# Patient Record
Sex: Female | Born: 1975 | Race: Black or African American | Hispanic: No | Marital: Single | Smoking: Current some day smoker
Health system: Southern US, Community
[De-identification: ages and names within clinical notes are randomized; demographics above are authoritative.]

## PROBLEM LIST (undated history)

## (undated) ENCOUNTER — Inpatient Hospital Stay (HOSPITAL_COMMUNITY): Payer: Self-pay

## (undated) DIAGNOSIS — F419 Anxiety disorder, unspecified: Secondary | ICD-10-CM

## (undated) DIAGNOSIS — B9689 Other specified bacterial agents as the cause of diseases classified elsewhere: Secondary | ICD-10-CM

## (undated) DIAGNOSIS — N83209 Unspecified ovarian cyst, unspecified side: Secondary | ICD-10-CM

## (undated) DIAGNOSIS — N760 Acute vaginitis: Secondary | ICD-10-CM

## (undated) DIAGNOSIS — N946 Dysmenorrhea, unspecified: Secondary | ICD-10-CM

## (undated) DIAGNOSIS — R569 Unspecified convulsions: Secondary | ICD-10-CM

## (undated) DIAGNOSIS — D649 Anemia, unspecified: Secondary | ICD-10-CM

## (undated) DIAGNOSIS — F329 Major depressive disorder, single episode, unspecified: Secondary | ICD-10-CM

## (undated) DIAGNOSIS — F909 Attention-deficit hyperactivity disorder, unspecified type: Secondary | ICD-10-CM

## (undated) DIAGNOSIS — B999 Unspecified infectious disease: Secondary | ICD-10-CM

## (undated) DIAGNOSIS — T7840XA Allergy, unspecified, initial encounter: Secondary | ICD-10-CM

## (undated) DIAGNOSIS — B009 Herpesviral infection, unspecified: Secondary | ICD-10-CM

## (undated) DIAGNOSIS — F32A Depression, unspecified: Secondary | ICD-10-CM

## (undated) DIAGNOSIS — O24419 Gestational diabetes mellitus in pregnancy, unspecified control: Secondary | ICD-10-CM

## (undated) DIAGNOSIS — J45909 Unspecified asthma, uncomplicated: Secondary | ICD-10-CM

## (undated) DIAGNOSIS — F1910 Other psychoactive substance abuse, uncomplicated: Secondary | ICD-10-CM

## (undated) HISTORY — DX: Gestational diabetes mellitus in pregnancy, unspecified control: O24.419

## (undated) HISTORY — DX: Anxiety disorder, unspecified: F41.9

## (undated) HISTORY — PX: CYSTECTOMY: SUR359

## (undated) HISTORY — PX: WISDOM TOOTH EXTRACTION: SHX21

## (undated) HISTORY — DX: Anemia, unspecified: D64.9

## (undated) HISTORY — DX: Other psychoactive substance abuse, uncomplicated: F19.10

## (undated) HISTORY — PX: HERNIA REPAIR: SHX51

## (undated) HISTORY — DX: Other specified bacterial agents as the cause of diseases classified elsewhere: N76.0

## (undated) HISTORY — DX: Other specified bacterial agents as the cause of diseases classified elsewhere: B96.89

## (undated) HISTORY — DX: Dysmenorrhea, unspecified: N94.6

## (undated) HISTORY — DX: Major depressive disorder, single episode, unspecified: F32.9

## (undated) HISTORY — DX: Attention-deficit hyperactivity disorder, unspecified type: F90.9

## (undated) HISTORY — DX: Allergy, unspecified, initial encounter: T78.40XA

## (undated) HISTORY — DX: Depression, unspecified: F32.A

## (undated) HISTORY — DX: Herpesviral infection, unspecified: B00.9

---

## 1996-07-23 DIAGNOSIS — O98212 Gonorrhea complicating pregnancy, second trimester: Secondary | ICD-10-CM

## 1996-07-23 DIAGNOSIS — A749 Chlamydial infection, unspecified: Secondary | ICD-10-CM

## 1997-09-30 ENCOUNTER — Inpatient Hospital Stay (HOSPITAL_COMMUNITY): Admission: AD | Admit: 1997-09-30 | Discharge: 1997-09-30 | Payer: Self-pay | Admitting: Obstetrics

## 1997-10-06 ENCOUNTER — Observation Stay (HOSPITAL_COMMUNITY): Admission: AD | Admit: 1997-10-06 | Discharge: 1997-10-07 | Payer: Self-pay | Admitting: Obstetrics

## 1998-03-16 ENCOUNTER — Inpatient Hospital Stay (HOSPITAL_COMMUNITY): Admission: AD | Admit: 1998-03-16 | Discharge: 1998-03-16 | Payer: Self-pay | Admitting: Obstetrics

## 1998-05-07 ENCOUNTER — Inpatient Hospital Stay (HOSPITAL_COMMUNITY): Admission: AD | Admit: 1998-05-07 | Discharge: 1998-05-10 | Payer: Self-pay | Admitting: *Deleted

## 2000-02-20 ENCOUNTER — Other Ambulatory Visit: Admission: RE | Admit: 2000-02-20 | Discharge: 2000-02-20 | Payer: Self-pay | Admitting: Obstetrics

## 2000-06-25 ENCOUNTER — Encounter (HOSPITAL_BASED_OUTPATIENT_CLINIC_OR_DEPARTMENT_OTHER): Payer: Self-pay | Admitting: General Surgery

## 2000-06-27 ENCOUNTER — Ambulatory Visit (HOSPITAL_COMMUNITY): Admission: RE | Admit: 2000-06-27 | Discharge: 2000-06-27 | Payer: Self-pay | Admitting: General Surgery

## 2000-07-19 ENCOUNTER — Ambulatory Visit (HOSPITAL_COMMUNITY): Admission: RE | Admit: 2000-07-19 | Discharge: 2000-07-19 | Payer: Self-pay | Admitting: General Surgery

## 2000-08-19 ENCOUNTER — Encounter (INDEPENDENT_AMBULATORY_CARE_PROVIDER_SITE_OTHER): Payer: Self-pay | Admitting: Specialist

## 2000-08-19 ENCOUNTER — Ambulatory Visit (HOSPITAL_COMMUNITY): Admission: RE | Admit: 2000-08-19 | Discharge: 2000-08-19 | Payer: Self-pay | Admitting: General Surgery

## 2001-07-17 ENCOUNTER — Encounter: Payer: Self-pay | Admitting: *Deleted

## 2001-07-17 ENCOUNTER — Emergency Department (HOSPITAL_COMMUNITY): Admission: EM | Admit: 2001-07-17 | Discharge: 2001-07-17 | Payer: Self-pay | Admitting: Emergency Medicine

## 2001-09-11 ENCOUNTER — Encounter: Payer: Self-pay | Admitting: Obstetrics

## 2001-09-11 ENCOUNTER — Ambulatory Visit (HOSPITAL_COMMUNITY): Admission: RE | Admit: 2001-09-11 | Discharge: 2001-09-11 | Payer: Self-pay | Admitting: Obstetrics

## 2001-12-29 ENCOUNTER — Inpatient Hospital Stay (HOSPITAL_COMMUNITY): Admission: AD | Admit: 2001-12-29 | Discharge: 2001-12-29 | Payer: Self-pay | Admitting: Obstetrics

## 2002-01-05 ENCOUNTER — Inpatient Hospital Stay (HOSPITAL_COMMUNITY): Admission: AD | Admit: 2002-01-05 | Discharge: 2002-01-05 | Payer: Self-pay | Admitting: Obstetrics

## 2002-01-13 ENCOUNTER — Inpatient Hospital Stay (HOSPITAL_COMMUNITY): Admission: AD | Admit: 2002-01-13 | Discharge: 2002-01-13 | Payer: Self-pay | Admitting: Obstetrics

## 2002-01-28 ENCOUNTER — Inpatient Hospital Stay (HOSPITAL_COMMUNITY): Admission: AD | Admit: 2002-01-28 | Discharge: 2002-01-28 | Payer: Self-pay | Admitting: Obstetrics

## 2002-02-25 ENCOUNTER — Inpatient Hospital Stay (HOSPITAL_COMMUNITY): Admission: AD | Admit: 2002-02-25 | Discharge: 2002-02-25 | Payer: Self-pay | Admitting: Obstetrics

## 2002-02-26 ENCOUNTER — Inpatient Hospital Stay (HOSPITAL_COMMUNITY): Admission: AD | Admit: 2002-02-26 | Discharge: 2002-02-28 | Payer: Self-pay | Admitting: Obstetrics

## 2003-09-11 ENCOUNTER — Emergency Department (HOSPITAL_COMMUNITY): Admission: EM | Admit: 2003-09-11 | Discharge: 2003-09-12 | Payer: Self-pay | Admitting: Emergency Medicine

## 2003-12-07 ENCOUNTER — Other Ambulatory Visit: Admission: RE | Admit: 2003-12-07 | Discharge: 2003-12-07 | Payer: Self-pay | Admitting: Family Medicine

## 2004-07-10 ENCOUNTER — Other Ambulatory Visit: Admission: RE | Admit: 2004-07-10 | Discharge: 2004-07-10 | Payer: Self-pay | Admitting: Family Medicine

## 2004-07-10 ENCOUNTER — Ambulatory Visit: Payer: Self-pay | Admitting: Family Medicine

## 2004-08-09 ENCOUNTER — Emergency Department (HOSPITAL_COMMUNITY): Admission: EM | Admit: 2004-08-09 | Discharge: 2004-08-09 | Payer: Self-pay | Admitting: Family Medicine

## 2004-08-11 ENCOUNTER — Emergency Department (HOSPITAL_COMMUNITY): Admission: EM | Admit: 2004-08-11 | Discharge: 2004-08-11 | Payer: Self-pay | Admitting: Family Medicine

## 2004-08-21 ENCOUNTER — Ambulatory Visit: Payer: Self-pay | Admitting: Family Medicine

## 2004-11-02 ENCOUNTER — Ambulatory Visit: Payer: Self-pay | Admitting: Family Medicine

## 2004-11-16 ENCOUNTER — Ambulatory Visit: Payer: Self-pay | Admitting: Sports Medicine

## 2004-11-30 ENCOUNTER — Ambulatory Visit: Payer: Self-pay | Admitting: Family Medicine

## 2004-12-07 ENCOUNTER — Ambulatory Visit: Payer: Self-pay | Admitting: Family Medicine

## 2005-02-21 ENCOUNTER — Ambulatory Visit: Payer: Self-pay | Admitting: Family Medicine

## 2005-06-20 ENCOUNTER — Ambulatory Visit: Payer: Self-pay | Admitting: Family Medicine

## 2005-07-23 ENCOUNTER — Encounter (INDEPENDENT_AMBULATORY_CARE_PROVIDER_SITE_OTHER): Payer: Self-pay | Admitting: *Deleted

## 2005-07-23 LAB — CONVERTED CEMR LAB

## 2005-08-03 ENCOUNTER — Other Ambulatory Visit: Admission: RE | Admit: 2005-08-03 | Discharge: 2005-08-03 | Payer: Self-pay | Admitting: Family Medicine

## 2005-08-03 ENCOUNTER — Ambulatory Visit: Payer: Self-pay | Admitting: Sports Medicine

## 2005-08-15 ENCOUNTER — Ambulatory Visit: Payer: Self-pay | Admitting: Family Medicine

## 2005-10-24 ENCOUNTER — Ambulatory Visit: Payer: Self-pay | Admitting: Family Medicine

## 2005-11-22 ENCOUNTER — Ambulatory Visit: Payer: Self-pay | Admitting: Family Medicine

## 2005-12-05 ENCOUNTER — Ambulatory Visit: Payer: Self-pay | Admitting: Family Medicine

## 2005-12-10 ENCOUNTER — Ambulatory Visit: Payer: Self-pay | Admitting: Family Medicine

## 2005-12-12 ENCOUNTER — Ambulatory Visit: Payer: Self-pay | Admitting: Family Medicine

## 2006-02-14 ENCOUNTER — Ambulatory Visit: Payer: Self-pay | Admitting: Sports Medicine

## 2006-02-19 ENCOUNTER — Ambulatory Visit: Payer: Self-pay | Admitting: Family Medicine

## 2006-03-01 ENCOUNTER — Ambulatory Visit: Payer: Self-pay | Admitting: Family Medicine

## 2006-03-04 ENCOUNTER — Ambulatory Visit: Payer: Self-pay | Admitting: Sports Medicine

## 2006-05-27 ENCOUNTER — Emergency Department (HOSPITAL_COMMUNITY): Admission: EM | Admit: 2006-05-27 | Discharge: 2006-05-27 | Payer: Self-pay | Admitting: Family Medicine

## 2006-05-28 ENCOUNTER — Ambulatory Visit: Payer: Self-pay | Admitting: Family Medicine

## 2006-06-11 ENCOUNTER — Ambulatory Visit: Payer: Self-pay | Admitting: Family Medicine

## 2006-06-19 ENCOUNTER — Ambulatory Visit: Payer: Self-pay | Admitting: Family Medicine

## 2006-09-19 DIAGNOSIS — J309 Allergic rhinitis, unspecified: Secondary | ICD-10-CM | POA: Insufficient documentation

## 2006-09-19 DIAGNOSIS — F39 Unspecified mood [affective] disorder: Secondary | ICD-10-CM | POA: Insufficient documentation

## 2006-09-19 DIAGNOSIS — D509 Iron deficiency anemia, unspecified: Secondary | ICD-10-CM | POA: Insufficient documentation

## 2006-09-20 ENCOUNTER — Encounter (INDEPENDENT_AMBULATORY_CARE_PROVIDER_SITE_OTHER): Payer: Self-pay | Admitting: *Deleted

## 2006-09-30 ENCOUNTER — Telehealth: Payer: Self-pay | Admitting: *Deleted

## 2006-10-02 ENCOUNTER — Ambulatory Visit: Payer: Self-pay | Admitting: Sports Medicine

## 2006-10-02 LAB — CONVERTED CEMR LAB: Beta hcg, urine, semiquantitative: NEGATIVE

## 2006-10-24 ENCOUNTER — Telehealth: Payer: Self-pay | Admitting: *Deleted

## 2006-10-25 ENCOUNTER — Ambulatory Visit: Payer: Self-pay | Admitting: Family Medicine

## 2006-10-25 LAB — CONVERTED CEMR LAB: Beta hcg, urine, semiquantitative: NEGATIVE

## 2006-11-04 ENCOUNTER — Telehealth (INDEPENDENT_AMBULATORY_CARE_PROVIDER_SITE_OTHER): Payer: Self-pay | Admitting: *Deleted

## 2006-11-06 ENCOUNTER — Ambulatory Visit: Payer: Self-pay | Admitting: Family Medicine

## 2006-11-06 DIAGNOSIS — M2010 Hallux valgus (acquired), unspecified foot: Secondary | ICD-10-CM | POA: Insufficient documentation

## 2006-11-11 ENCOUNTER — Telehealth: Payer: Self-pay | Admitting: *Deleted

## 2006-11-14 ENCOUNTER — Telehealth (INDEPENDENT_AMBULATORY_CARE_PROVIDER_SITE_OTHER): Payer: Self-pay | Admitting: *Deleted

## 2006-11-18 ENCOUNTER — Telehealth (INDEPENDENT_AMBULATORY_CARE_PROVIDER_SITE_OTHER): Payer: Self-pay | Admitting: Family Medicine

## 2006-11-20 ENCOUNTER — Telehealth: Payer: Self-pay | Admitting: *Deleted

## 2006-11-21 ENCOUNTER — Telehealth: Payer: Self-pay | Admitting: *Deleted

## 2006-12-02 ENCOUNTER — Telehealth: Payer: Self-pay | Admitting: *Deleted

## 2006-12-17 ENCOUNTER — Telehealth: Payer: Self-pay | Admitting: *Deleted

## 2006-12-17 ENCOUNTER — Encounter (INDEPENDENT_AMBULATORY_CARE_PROVIDER_SITE_OTHER): Payer: Self-pay | Admitting: *Deleted

## 2007-01-21 ENCOUNTER — Telehealth: Payer: Self-pay | Admitting: *Deleted

## 2007-01-23 ENCOUNTER — Encounter (INDEPENDENT_AMBULATORY_CARE_PROVIDER_SITE_OTHER): Payer: Self-pay | Admitting: Family Medicine

## 2007-01-23 ENCOUNTER — Ambulatory Visit: Payer: Self-pay | Admitting: Family Medicine

## 2007-01-23 DIAGNOSIS — N946 Dysmenorrhea, unspecified: Secondary | ICD-10-CM | POA: Insufficient documentation

## 2007-01-23 LAB — CONVERTED CEMR LAB: Whiff Test: NEGATIVE

## 2007-01-26 LAB — CONVERTED CEMR LAB
Chlamydia, DNA Probe: POSITIVE — AB
GC Probe Amp, Genital: NEGATIVE
HCT: 38.2 % (ref 36.0–46.0)
Hemoglobin: 12 g/dL (ref 12.0–15.0)
MCHC: 31.4 g/dL (ref 30.0–36.0)
MCV: 85.1 fL (ref 78.0–100.0)
Platelets: 246 10*3/uL (ref 150–400)
RBC: 4.49 M/uL (ref 3.87–5.11)
RDW: 13.3 % (ref 11.5–14.0)
TSH: 0.67 microintl units/mL (ref 0.350–5.50)
WBC: 8.2 10*3/uL (ref 4.0–10.5)

## 2007-02-12 ENCOUNTER — Telehealth: Payer: Self-pay | Admitting: *Deleted

## 2007-02-12 ENCOUNTER — Encounter (INDEPENDENT_AMBULATORY_CARE_PROVIDER_SITE_OTHER): Payer: Self-pay | Admitting: *Deleted

## 2007-02-21 ENCOUNTER — Telehealth: Payer: Self-pay | Admitting: *Deleted

## 2007-02-26 ENCOUNTER — Encounter (INDEPENDENT_AMBULATORY_CARE_PROVIDER_SITE_OTHER): Payer: Self-pay | Admitting: Family Medicine

## 2007-02-26 ENCOUNTER — Ambulatory Visit: Payer: Self-pay | Admitting: Family Medicine

## 2007-02-26 LAB — CONVERTED CEMR LAB
Bilirubin Urine: NEGATIVE
Blood in Urine, dipstick: NEGATIVE
Chlamydia, DNA Probe: NEGATIVE
GC Probe Amp, Genital: NEGATIVE
Glucose, Urine, Semiquant: NEGATIVE
Ketones, urine, test strip: NEGATIVE
Nitrite: NEGATIVE
Protein, U semiquant: NEGATIVE
Specific Gravity, Urine: 1.025
Urobilinogen, UA: 0.2
WBC Urine, dipstick: NEGATIVE
pH: 6.5

## 2007-02-27 ENCOUNTER — Encounter (INDEPENDENT_AMBULATORY_CARE_PROVIDER_SITE_OTHER): Payer: Self-pay | Admitting: Family Medicine

## 2007-02-27 ENCOUNTER — Telehealth: Payer: Self-pay | Admitting: *Deleted

## 2007-03-19 ENCOUNTER — Telehealth: Payer: Self-pay | Admitting: *Deleted

## 2007-04-28 ENCOUNTER — Telehealth: Payer: Self-pay | Admitting: *Deleted

## 2007-08-15 ENCOUNTER — Ambulatory Visit: Payer: Self-pay | Admitting: Family Medicine

## 2007-08-15 ENCOUNTER — Telehealth: Payer: Self-pay | Admitting: *Deleted

## 2007-08-15 LAB — CONVERTED CEMR LAB: Beta hcg, urine, semiquantitative: NEGATIVE

## 2007-09-03 ENCOUNTER — Ambulatory Visit: Payer: Self-pay | Admitting: Family Medicine

## 2007-10-10 ENCOUNTER — Telehealth: Payer: Self-pay | Admitting: *Deleted

## 2007-10-13 ENCOUNTER — Encounter (INDEPENDENT_AMBULATORY_CARE_PROVIDER_SITE_OTHER): Payer: Self-pay | Admitting: *Deleted

## 2007-10-13 ENCOUNTER — Emergency Department (HOSPITAL_COMMUNITY): Admission: EM | Admit: 2007-10-13 | Discharge: 2007-10-13 | Payer: Self-pay | Admitting: Emergency Medicine

## 2007-10-13 ENCOUNTER — Telehealth: Payer: Self-pay | Admitting: *Deleted

## 2007-10-28 ENCOUNTER — Encounter (INDEPENDENT_AMBULATORY_CARE_PROVIDER_SITE_OTHER): Payer: Self-pay | Admitting: Family Medicine

## 2007-10-28 ENCOUNTER — Ambulatory Visit: Payer: Self-pay | Admitting: Family Medicine

## 2007-10-28 ENCOUNTER — Other Ambulatory Visit: Admission: RE | Admit: 2007-10-28 | Discharge: 2007-10-28 | Payer: Self-pay | Admitting: Family Medicine

## 2007-10-28 LAB — CONVERTED CEMR LAB: Whiff Test: NEGATIVE

## 2007-10-29 LAB — CONVERTED CEMR LAB
Chlamydia, DNA Probe: NEGATIVE
GC Probe Amp, Genital: NEGATIVE

## 2007-11-01 ENCOUNTER — Encounter: Admission: RE | Admit: 2007-11-01 | Discharge: 2007-11-01 | Payer: Self-pay | Admitting: Sports Medicine

## 2007-11-03 ENCOUNTER — Encounter (INDEPENDENT_AMBULATORY_CARE_PROVIDER_SITE_OTHER): Payer: Self-pay | Admitting: Family Medicine

## 2007-11-03 LAB — CONVERTED CEMR LAB: Pap Smear: NORMAL

## 2007-11-17 ENCOUNTER — Encounter (INDEPENDENT_AMBULATORY_CARE_PROVIDER_SITE_OTHER): Payer: Self-pay | Admitting: Family Medicine

## 2007-12-24 ENCOUNTER — Encounter (INDEPENDENT_AMBULATORY_CARE_PROVIDER_SITE_OTHER): Payer: Self-pay | Admitting: *Deleted

## 2007-12-24 ENCOUNTER — Ambulatory Visit: Payer: Self-pay | Admitting: Family Medicine

## 2007-12-24 LAB — CONVERTED CEMR LAB
Chlamydia, DNA Probe: NEGATIVE
GC Probe Amp, Genital: NEGATIVE
Rapid Strep: POSITIVE

## 2007-12-25 ENCOUNTER — Telehealth: Payer: Self-pay | Admitting: *Deleted

## 2007-12-26 ENCOUNTER — Encounter (INDEPENDENT_AMBULATORY_CARE_PROVIDER_SITE_OTHER): Payer: Self-pay | Admitting: *Deleted

## 2008-02-04 ENCOUNTER — Ambulatory Visit: Payer: Self-pay | Admitting: Family Medicine

## 2008-02-04 LAB — CONVERTED CEMR LAB
Bilirubin Urine: NEGATIVE
Blood in Urine, dipstick: NEGATIVE
Glucose, Urine, Semiquant: NEGATIVE
Ketones, urine, test strip: NEGATIVE
Nitrite: NEGATIVE
Protein, U semiquant: NEGATIVE
Specific Gravity, Urine: >=1.03
Urobilinogen, UA: 0.2
Whiff Test: POSITIVE
pH: 5.5

## 2008-02-13 ENCOUNTER — Ambulatory Visit: Payer: Self-pay | Admitting: Family Medicine

## 2008-02-13 ENCOUNTER — Telehealth: Payer: Self-pay | Admitting: *Deleted

## 2008-03-10 ENCOUNTER — Telehealth (INDEPENDENT_AMBULATORY_CARE_PROVIDER_SITE_OTHER): Payer: Self-pay | Admitting: *Deleted

## 2008-04-19 ENCOUNTER — Telehealth: Payer: Self-pay | Admitting: *Deleted

## 2008-04-29 ENCOUNTER — Telehealth (INDEPENDENT_AMBULATORY_CARE_PROVIDER_SITE_OTHER): Payer: Self-pay | Admitting: *Deleted

## 2008-05-10 ENCOUNTER — Encounter (INDEPENDENT_AMBULATORY_CARE_PROVIDER_SITE_OTHER): Payer: Self-pay | Admitting: Family Medicine

## 2008-05-10 ENCOUNTER — Ambulatory Visit: Payer: Self-pay | Admitting: Family Medicine

## 2008-05-10 LAB — CONVERTED CEMR LAB
Beta hcg, urine, semiquantitative: NEGATIVE
Chlamydia, DNA Probe: NEGATIVE
GC Probe Amp, Genital: NEGATIVE
Whiff Test: POSITIVE

## 2008-05-11 ENCOUNTER — Encounter (INDEPENDENT_AMBULATORY_CARE_PROVIDER_SITE_OTHER): Payer: Self-pay | Admitting: Family Medicine

## 2008-05-12 ENCOUNTER — Telehealth (INDEPENDENT_AMBULATORY_CARE_PROVIDER_SITE_OTHER): Payer: Self-pay | Admitting: Family Medicine

## 2008-05-25 ENCOUNTER — Telehealth: Payer: Self-pay | Admitting: *Deleted

## 2008-07-14 ENCOUNTER — Telehealth: Payer: Self-pay | Admitting: *Deleted

## 2008-07-19 ENCOUNTER — Telehealth (INDEPENDENT_AMBULATORY_CARE_PROVIDER_SITE_OTHER): Payer: Self-pay | Admitting: *Deleted

## 2008-09-10 ENCOUNTER — Ambulatory Visit: Payer: Self-pay | Admitting: Family Medicine

## 2008-09-10 DIAGNOSIS — L738 Other specified follicular disorders: Secondary | ICD-10-CM

## 2008-09-10 DIAGNOSIS — L708 Other acne: Secondary | ICD-10-CM | POA: Insufficient documentation

## 2008-09-10 HISTORY — DX: Other specified follicular disorders: L73.8

## 2008-09-10 LAB — CONVERTED CEMR LAB
Beta hcg, urine, semiquantitative: NEGATIVE
Whiff Test: POSITIVE

## 2008-10-21 ENCOUNTER — Telehealth: Payer: Self-pay | Admitting: *Deleted

## 2008-11-12 ENCOUNTER — Ambulatory Visit: Payer: Self-pay | Admitting: Family Medicine

## 2008-11-12 ENCOUNTER — Other Ambulatory Visit: Admission: RE | Admit: 2008-11-12 | Discharge: 2008-11-12 | Payer: Self-pay | Admitting: Family Medicine

## 2008-11-12 ENCOUNTER — Encounter (INDEPENDENT_AMBULATORY_CARE_PROVIDER_SITE_OTHER): Payer: Self-pay | Admitting: Family Medicine

## 2008-11-12 LAB — CONVERTED CEMR LAB
Beta hcg, urine, semiquantitative: NEGATIVE
Whiff Test: NEGATIVE

## 2008-11-15 ENCOUNTER — Encounter (INDEPENDENT_AMBULATORY_CARE_PROVIDER_SITE_OTHER): Payer: Self-pay | Admitting: Family Medicine

## 2008-11-16 ENCOUNTER — Encounter: Payer: Self-pay | Admitting: *Deleted

## 2008-11-16 ENCOUNTER — Telehealth (INDEPENDENT_AMBULATORY_CARE_PROVIDER_SITE_OTHER): Payer: Self-pay | Admitting: *Deleted

## 2008-11-16 ENCOUNTER — Encounter (INDEPENDENT_AMBULATORY_CARE_PROVIDER_SITE_OTHER): Payer: Self-pay | Admitting: Family Medicine

## 2008-11-16 LAB — CONVERTED CEMR LAB
Chlamydia, DNA Probe: NEGATIVE
Cholesterol: 173 mg/dL (ref 0–200)
GC Probe Amp, Genital: POSITIVE — AB
Glucose, Bld: 90 mg/dL (ref 70–99)
HDL: 86 mg/dL (ref >39)
LDL Cholesterol: 75 mg/dL (ref 0–99)
Total CHOL/HDL Ratio: 2
Triglycerides: 59 mg/dL (ref <150)
VLDL: 12 mg/dL (ref 0–40)

## 2008-11-17 ENCOUNTER — Ambulatory Visit: Payer: Self-pay | Admitting: Family Medicine

## 2008-11-18 ENCOUNTER — Encounter: Payer: Self-pay | Admitting: *Deleted

## 2008-11-23 ENCOUNTER — Telehealth (INDEPENDENT_AMBULATORY_CARE_PROVIDER_SITE_OTHER): Payer: Self-pay | Admitting: Family Medicine

## 2008-11-24 ENCOUNTER — Encounter (INDEPENDENT_AMBULATORY_CARE_PROVIDER_SITE_OTHER): Payer: Self-pay | Admitting: Family Medicine

## 2008-11-24 ENCOUNTER — Ambulatory Visit: Payer: Self-pay | Admitting: Family Medicine

## 2008-11-24 LAB — CONVERTED CEMR LAB
Chlamydia, DNA Probe: NEGATIVE
GC Probe Amp, Genital: NEGATIVE
Whiff Test: NEGATIVE

## 2008-11-25 ENCOUNTER — Encounter (INDEPENDENT_AMBULATORY_CARE_PROVIDER_SITE_OTHER): Payer: Self-pay | Admitting: Family Medicine

## 2009-01-07 ENCOUNTER — Encounter: Payer: Self-pay | Admitting: *Deleted

## 2009-04-02 ENCOUNTER — Encounter (INDEPENDENT_AMBULATORY_CARE_PROVIDER_SITE_OTHER): Payer: Self-pay | Admitting: *Deleted

## 2009-04-02 DIAGNOSIS — F17200 Nicotine dependence, unspecified, uncomplicated: Secondary | ICD-10-CM | POA: Insufficient documentation

## 2009-04-14 ENCOUNTER — Encounter: Payer: Self-pay | Admitting: *Deleted

## 2009-04-25 ENCOUNTER — Telehealth: Payer: Self-pay | Admitting: Family Medicine

## 2009-04-28 ENCOUNTER — Ambulatory Visit: Payer: Self-pay | Admitting: Family Medicine

## 2009-04-29 ENCOUNTER — Encounter: Payer: Self-pay | Admitting: Family Medicine

## 2009-10-31 ENCOUNTER — Encounter: Payer: Self-pay | Admitting: *Deleted

## 2010-05-25 ENCOUNTER — Encounter: Payer: Self-pay | Admitting: Family Medicine

## 2010-05-30 ENCOUNTER — Encounter: Payer: Self-pay | Admitting: Family Medicine

## 2010-05-30 ENCOUNTER — Telehealth: Payer: Self-pay | Admitting: Family Medicine

## 2010-05-30 ENCOUNTER — Ambulatory Visit: Payer: Self-pay | Admitting: Family Medicine

## 2010-05-30 DIAGNOSIS — N760 Acute vaginitis: Secondary | ICD-10-CM | POA: Insufficient documentation

## 2010-05-30 LAB — CONVERTED CEMR LAB
Chlamydia, DNA Probe: NEGATIVE
GC Probe Amp, Genital: NEGATIVE
Whiff Test: POSITIVE

## 2010-05-31 ENCOUNTER — Encounter: Payer: Self-pay | Admitting: Family Medicine

## 2010-06-01 ENCOUNTER — Telehealth: Payer: Self-pay | Admitting: Family Medicine

## 2010-06-22 ENCOUNTER — Emergency Department (HOSPITAL_COMMUNITY)
Admission: EM | Admit: 2010-06-22 | Discharge: 2010-06-22 | Payer: Self-pay | Source: Home / Self Care | Admitting: Emergency Medicine

## 2010-06-26 ENCOUNTER — Telehealth (INDEPENDENT_AMBULATORY_CARE_PROVIDER_SITE_OTHER): Payer: Self-pay | Admitting: *Deleted

## 2010-07-03 ENCOUNTER — Ambulatory Visit: Payer: Self-pay

## 2010-07-12 ENCOUNTER — Encounter: Payer: Self-pay | Admitting: Family Medicine

## 2010-08-04 ENCOUNTER — Telehealth: Payer: Self-pay | Admitting: Family Medicine

## 2010-08-04 ENCOUNTER — Encounter: Payer: Self-pay | Admitting: Family Medicine

## 2010-08-04 ENCOUNTER — Ambulatory Visit
Admission: RE | Admit: 2010-08-04 | Discharge: 2010-08-04 | Payer: Self-pay | Source: Home / Self Care | Attending: Family Medicine | Admitting: Family Medicine

## 2010-08-04 LAB — CONVERTED CEMR LAB
Chlamydia, DNA Probe: NEGATIVE
GC Probe Amp, Genital: NEGATIVE
Whiff Test: NEGATIVE

## 2010-08-05 ENCOUNTER — Encounter: Payer: Self-pay | Admitting: Family Medicine

## 2010-08-22 NOTE — Miscellaneous (Signed)
Summary: Update Problem List  Clinical Lists Changes  Problems: Removed problem of GONOCOCCAL INFECTION LOWER GENITOURINARY TRACT (ICD-098.0) Removed problem of FAMILY HISTORY OF DIABETES MELLITUS (ICD-V18.0) Removed problem of SCREENING OTHER&UNSPEC CARDIOVASCULAR CONDITIONS (ICD-V81.2) Removed problem of CONTACT OR EXPOSURE TO OTHER VIRAL DISEASES (ICD-V01.79) Removed problem of SCREENING FOR MALIGNANT NEOPLASM OF THE CERVIX (ICD-V76.2) Removed problem of AMENORRHEA (ICD-626.0) Removed problem of OTHER SPECIFIED VISUAL DISTURBANCES (ICD-368.8) Removed problem of VAGINAL DISCHARGE (ICD-623.5) Removed problem of SORE THROAT (ICD-462) Removed problem of ROUTINE GYNECOLOGICAL EXAMINATION (ICD-V72.31) Removed problem of CONTRACEPTIVE MANAGEMENT (ICD-V25.09) Removed problem of HEADACHE (ICD-784.0) Removed problem of SCREENING FOR LIPOID DISORDERS (ICD-V77.91) Removed problem of SCREENING FOR MALIGNANT NEOPLASM OF THE CERVIX (ICD-V76.2) Removed problem of FLANK PAIN, RIGHT (ICD-789.09) Removed problem of DYSURIA (ICD-788.1) Removed problem of SEXUALLY TRANSMITTED DISEASE, EXPOSURE TO (ICD-V01.6) Removed problem of VAGINITIS NOS (ICD-616.10) Removed problem of CONTRACEPTIVE MANAGEMENT (ICD-V25.09) Observations: Added new observation of PAST MED HX: abnormal menses, G1- sAB at 4 mos, [id number]- 2 NSVDs at [facility], GC & Chlamydia- treated, preterm labor multiple STDs  Current Problems:  ASTEATOTIC ECZEMA (ICD-706.8) ACNE, 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 of WEIGHT LOSS, ABNORMAL (ICD-783.21) DYSMENORRHEA (ICD-625.3) HALLUX VALGUS, ACQUIRED (ICD-735.0) RHINITIS, ALLERGIC (ICD-477.9) DEPRESSION, MAJOR, RECURRENT (ICD-296.30) ANEMIA, IRON DEFICIENCY, UNSPEC. (ICD-280.9)   (05/25/2010 11:38)      Past Medical History:    abnormal menses, G1- sAB at 4 mos, [id number]- 2 NSVDs at [facility], GC & Chlamydia- treated, preterm labor    multiple STDs        Current Problems:     ASTEATOTIC ECZEMA (ICD-706.8)  ACNE, MILD (ICD-706.1)    ? of WEIGHT LOSS, ABNORMAL (ICD-783.21)    DYSMENORRHEA (ICD-625.3)    HALLUX VALGUS, ACQUIRED (ICD-735.0)    RHINITIS, ALLERGIC (ICD-477.9)    DEPRESSION, MAJOR, RECURRENT (ICD-296.30)    ANEMIA, IRON DEFICIENCY, UNSPEC. (ICD-280.9)

## 2010-08-22 NOTE — Progress Notes (Signed)
  Phone Note Call from Patient   Summary of Call: Spoke with pt- she states she went to [facility] last thurs for vag irritation and d/c- states she was dx with chlamydia and gonorrhea.  States that she had intercourse last wed with her ex with protection but the condom broke.  States she had intercourse with her fiance 8 days ago.  Asked if males show symptoms sooner than females.  Advised her that everyone is different and males sometimes do not show external symptoms the way females do.  Advised that she have both partners be tested, and always use condoms for safer sex practices.  Advised her that she is still contagious for at least 7 days after she finishes treatment.  Initial call taken by: Rochele Pages RN,  June 26, 2010 3:29 PM

## 2010-08-22 NOTE — Miscellaneous (Signed)
Summary: refills  Clinical Lists Changes we got a fax asking for a drug that medicaid turned down. pcp had advised pt to but OTC. I called her to make an appt. all numbers in chart have been d/c'd.Golden Circle RN  Jerelene 11, 2011 2:55 PM

## 2010-08-22 NOTE — Progress Notes (Signed)
Summary: results  Phone Note Call from Patient Call back at [phone]   Caller: Patient Summary of Call: wants to know results of wet prep Initial call taken by: De Nurse,  May 30, 2010 1:41 PM  Follow-up for Phone Call        BV, called Follow-up by: Luretha Murphy NP,  May 30, 2010 3:52 PM

## 2010-08-22 NOTE — Letter (Signed)
Summary: Generic Letter  [facility]  [address]   [place], [place] [address]   Phone: [phone]  Fax: [phone]    05/31/2010  POOJA CAMUSO [address] [place], [place]  [address]  Dear Ms. STIVERSON,    The cultures done on 05/30/10 were negative.      Sincerely,   Luretha Murphy NP  Appended Document: Generic Letter mailed

## 2010-08-22 NOTE — Progress Notes (Signed)
Summary: Gerrie Nordmann calling  Phone Note From Other Clinic Call back at [phone]   Caller: [facility]  Behavioral Medicine Action Taken: Phone Call Completed Summary of Call: Requesting NPI and CA# so patient can be seen for therapy.  Information provided to [place].   Initial call taken by: Terese Door,  June 01, 2010 12:11 PM

## 2010-08-22 NOTE — Assessment & Plan Note (Signed)
Summary: yeast inf?,df   Vital Signs:  Patient profile:   35 year old female Height:      65 inches Weight:      147.50 pounds BMI:     24.63 BSA:     1.74 Temp:     98.4 degrees F Pulse rate:   76 / minute BP sitting:   130 / 84  Vitals Entered By: Jone Baseman CMA (May 30, 2010 11:01 AM) CC: vaginal discharge Is Patient Diabetic? No Pain Assessment Patient in pain? no        Primary Care Provider:  Milinda Antis MD  CC:  vaginal discharge.  History of Present Illness: Recurrent BV, now useing daily boric acid supp that she purchases from the pharmacy.  She is getting irritatated.  She is constantly trying to get rid of the odor in her vagina, she thinks about it all the time.  Her boyfriend does not complain at all.  She has tried all treatments.    Habits & Providers  Alcohol-Tobacco-Diet     Tobacco Status: current     Tobacco Counseling: to quit use of tobacco products     Cigarette Packs/Day: 0.25  Current Medications (verified): 1)  Zoloft 50 Mg Tabs (Sertraline Hcl) .... Take 1 Tablet By Mouth Once A Day 2)  Allegra-D 24 Hour 180-240 Mg  Tb24 (Fexofenadine-Pseudoephedrine) .Marland Kitchen.. 1 By Mouth Daily 3)  Sprintec 28 0.25-35 Mg-Mcg Tabs (Norgestimate-Eth Estradiol) .... One Daily 4)  Metrogel-Vaginal 0.75 % Gel (Metronidazole) .... One Applicator Full At Bedtime For 3 Nights, May Repeat For Symptoms of Odor 5)  Differin 0.1 % Gel (Adapalene) .... Apply To Face At Bedtime After Washing, Very Thin Layer, 30 Gm 6)  Fluconazole 150 Mg Tabs (Fluconazole) .Marland Kitchen.. 1 By Mouth X One Dose  Allergies (verified): No Known Drug Allergies  Review of Systems GU:  Complains of discharge.  Physical Exam  General:  Well-developed,well-nourished,in no acute distress; alert,appropriate and cooperative throughout examination Genitalia:  Normal introitus for age, no external lesions, no vaginal discharge, mucosa pink and moist, no vaginal or cervical lesions, no vaginal  atrophy, no friaility or hemorrhage, normal uterus size and position, no adnexal masses or tenderness  +wiff, + mod clues   Impression & Recommendations:  Problem # 1:  VAGINITIS, BACTERIAL, RECURRENT (ICD-616.10) conseled on trying to let it be and only using the boric acid supp for one week per month.  resaurred her that it will pose no harm. The following medications were removed from the medication list:    Metrogel-vaginal 0.75 % Gel (Metronidazole) ..... One applicator full at bedtime for 3 nights, may repeat for symptoms of odor  Orders: [facility]- Est Level  3 (09811)  Complete Medication List: 1)  Zoloft 50 Mg Tabs (Sertraline hcl) .... Take 1 tablet by mouth once a day 2)  Allegra-d 24 Hour 180-240 Mg Tb24 (Fexofenadine-pseudoephedrine) .Marland Kitchen.. 1 by mouth daily 3)  Sprintec 28 0.25-35 Mg-mcg Tabs (Norgestimate-eth estradiol) .... One daily 4)  Differin 0.1 % Gel (Adapalene) .... Apply to face at bedtime after washing, very thin layer, 30 gm  Other Orders: Wet Prep- [facility] (91478) GC/Chlamydia-[facility] (87591/87491) Influenza Vaccine NON MCR (29562)  Patient Instructions: 1)  Please schedule a follow-up appointment as needed .    Orders Added: 1)  Wet Prep- [facility] [87210] 2)  GC/Chlamydia-[facility] [87591/87491] 3)  Influenza Vaccine NON MCR [00028] 4)  [facility]- Est Level  3 [13086]   Immunizations Administered:  Influenza Vaccine # 1:  Vaccine Type: Fluvax Non-MCR    Site: left deltoid    Mfr: GlaxoSmithKline    Dose: 0.5 ml    Route: IM    Given by: Arlyss Repress CMA,    Exp. Date: 01/20/2011    Lot #: [id number]    VIS given: 02/14/10 version given May 30, 2010.  Flu Vaccine Consent Questions:    Do you have a history of severe allergic reactions to this vaccine? no    Any prior history of allergic reactions to egg and/or gelatin? no    Do you have a sensitivity to the preservative Thimersol? no    Do you have a past history of Guillan-Barre Syndrome? no    Do you currently  have an acute febrile illness? no    Have you ever had a severe reaction to latex? no    Vaccine information given and explained to patient? yes    Are you currently pregnant? no   Immunizations Administered:  Influenza Vaccine # 1:    Vaccine Type: Fluvax Non-MCR    Site: left deltoid    Mfr: GlaxoSmithKline    Dose: 0.5 ml    Route: IM    Given by: Arlyss Repress CMA,    Exp. Date: 01/20/2011    Lot #: [id number]    VIS given: 02/14/10 version given May 30, 2010.   Laboratory Results  Date/Time Received: May 30, 2010 11:43 AM  Date/Time Reported: May 30, 2010 12:43 PM   [employer] Source: vag WBC/hpf: 5-10 Bacteria/hpf: 3+  Cocci Clue cells/hpf: moderate  Positive whiff Yeast/hpf: none Trichomonas/hpf: none Comments: ...............test performed by......Marland KitchenBonnie A. [place], MLS (ASCP)cm

## 2010-08-24 ENCOUNTER — Encounter: Payer: Self-pay | Admitting: *Deleted

## 2010-08-24 NOTE — Assessment & Plan Note (Signed)
Summary: vaginal discharge,df   Vital Signs:  Patient profile:   35 year old female Height:      65 inches Weight:      148 pounds Temp:     98.2 degrees F oral Pulse rate:   91 / minute Pulse rhythm:   regular BP sitting:   128 / 84  (left arm) Cuff size:   regular  Vitals Entered By: Loralee Pacas CMA (August 04, 2010 10:49 AM) CC: bacterial vaginosis Is Patient Diabetic? No Pain Assessment Patient in pain? no        Primary Care Provider:  Milinda Antis MD  CC:  bacterial vaginosis.  History of Present Illness: 35 yo here for evaluation of recurrent BV  Patient states she continually has vaginal irritation that is consistant with previous episodes of BV.  Uses Boric acid capsules "more than I should" with recurrent BV>  thin white vaginal discharge with odor.  No pelvic, abdominal pain.   Patient tells me taht previous doctors have told her that she should not "obsess" about BV as much"  Habits & Providers  Alcohol-Tobacco-Diet     Tobacco Status: current     Tobacco Counseling: to quit use of tobacco products     Cigarette Packs/Day: 0.25  Exercise-Depression-Behavior     Have you felt down or hopeless? yes     Have you felt little pleasure in things? yes     Depression Counseling: not indicated; screening negative for depression     Seat Belt Use: always  Current Medications (verified): 1)  Zoloft 50 Mg Tabs (Sertraline Hcl) .... Take 1 Tablet By Mouth Once A Day 2)  Allegra-D 24 Hour 180-240 Mg  Tb24 (Fexofenadine-Pseudoephedrine) .Marland Kitchen.. 1 By Mouth Daily 3)  Sprintec 28 0.25-35 Mg-Mcg Tabs (Norgestimate-Eth Estradiol) .... One Daily 4)  Differin 0.1 % Gel (Adapalene) .... Apply To Face At Bedtime After Washing, Very Thin Layer, 30 Gm  Allergies: No Known Drug Allergies  Social History: Risk analyst Use:  always  Physical Exam  General:  Well-developed,well-nourished,in no acute distress; alert,appropriate and cooperative throughout examination.  vitals  reviewed Genitalia:  Pelvic Exam:        External: normal female genitalia without lesions or masses        Vagina: normal without lesions or masses        Cervix: normal without lesions or masses        Adnexa: normal bimanual exam without masses or fullness        Uterus: normal by palpation        Pap smear: not performed  no vaginal discharge noted.   Impression & Recommendations:  Problem # 1:  UNSPECIFIED VAGINITIS AND VULVOVAGINITIS (ICD-616.10) Wet prep and GC/CHlamydia normal.  Patient is otherwise asymptomatic except for feeling she has a discharge.  Discussed that some mucous and vaginal discharge is normal at different times duruing menstrual cycle.  Encouraged continued safe sex practices.  Orders: GC/Chlamydia-[facility] (87591/87491) Wet Prep- [facility] (16109) [facility]- Est Level  3 (60454)  Complete Medication List: 1)  Zoloft 50 Mg Tabs (Sertraline hcl) .... Take 1 tablet by mouth once a day 2)  Allegra-d 24 Hour 180-240 Mg Tb24 (Fexofenadine-pseudoephedrine) .Marland Kitchen.. 1 by mouth daily 3)  Sprintec 28 0.25-35 Mg-mcg Tabs (Norgestimate-eth estradiol) .... One daily 4)  Differin 0.1 % Gel (Adapalene) .... Apply to face at bedtime after washing, very thin layer, 30 gm   Orders Added: 1)  GC/Chlamydia-[facility] [87591/87491] 2)  Wet Prep- [facility] [09811] 3)  [facility]- Est Level  3 [16109]    Laboratory Results  Date/Time Received: August 04, 2010 11:14 AM  Date/Time Reported: August 04, 2010 11:55 AM   [employer] Source: vag WBC/hpf: 1-5 Bacteria/hpf: 1+  Cocci Clue cells/hpf: none  Negative whiff Yeast/hpf: none Trichomonas/hpf: none Comments: ...............test performed by......Marland KitchenBonnie A. [place], MLS (ASCP)cm

## 2010-08-24 NOTE — Letter (Signed)
Summary: Results Follow-up Letter  [facility]  [address]   [place], [place] [address]   Phone: [phone]  Fax: [phone]    08/05/2010  [address] [place], [place]  [address]  Dear Ms. WESTLEY,   The following are the results of your recent test(s):  Your tests for gonorrhea and chlamydia are negative.  Sincerely,  Delbert Harness MD [facility]           Appended Document: Results Follow-up Letter mailed

## 2010-08-24 NOTE — Progress Notes (Signed)
Summary: lab results  Phone Note Call from Patient Call back at [phone]   Reason for Call: Lab or Test Results Summary of Call: pt requesting we call her at the above number when her test results come in Initial call taken by: Knox Royalty,  August 04, 2010 1:42 PM  Follow-up for Phone Call        discussed with patient no BV or yeast.  She brings up that although she did tell me she used protection all of the time, that she  had a condom break. GC/Chlamydia pending.  She asks if she should be seen for irregular periods.  Advised takign OTC pregnancy test and scheduling appointment to discuss periodsfurther. Follow-up by: Delbert Harness MD,  August 04, 2010 3:43 PM

## 2010-08-30 NOTE — Consult Note (Signed)
Summary: [facility]  [facility]   Imported By: De Nurse 08/22/2010 15:21:05  _____________________________________________________________________  External Attachment:    Type:   Image     Comment:   External Document

## 2010-08-31 ENCOUNTER — Ambulatory Visit (HOSPITAL_COMMUNITY): Payer: Self-pay | Admitting: Physician Assistant

## 2010-09-01 ENCOUNTER — Inpatient Hospital Stay (INDEPENDENT_AMBULATORY_CARE_PROVIDER_SITE_OTHER)
Admission: RE | Admit: 2010-09-01 | Discharge: 2010-09-01 | Disposition: A | Discharge status: Home / Self Care | Payer: Medicaid Other | Source: Ambulatory Visit | Attending: Family Medicine | Admitting: Family Medicine

## 2010-09-01 DIAGNOSIS — N739 Female pelvic inflammatory disease, unspecified: Secondary | ICD-10-CM

## 2010-09-01 LAB — POCT URINALYSIS DIPSTICK
Bilirubin Urine: NEGATIVE
Hgb urine dipstick: NEGATIVE
Ketones, ur: NEGATIVE mg/dL
Nitrite: NEGATIVE
Protein, ur: NEGATIVE mg/dL
Specific Gravity, Urine: 1.02 (ref 1.005–1.030)
Urine Glucose, Fasting: NEGATIVE mg/dL
Urobilinogen, UA: 0.2 mg/dL (ref 0.0–1.0)
pH: 7 (ref 5.0–8.0)

## 2010-09-01 LAB — WET PREP, GENITAL
Clue Cells Wet Prep HPF POC: NONE SEEN
Trich, Wet Prep: NONE SEEN
Yeast Wet Prep HPF POC: NONE SEEN

## 2010-09-01 LAB — HIV ANTIBODY (ROUTINE TESTING W REFLEX): HIV: NONREACTIVE

## 2010-09-01 LAB — POCT PREGNANCY, URINE: Preg Test, Ur: NEGATIVE

## 2010-09-05 LAB — GC/CHLAMYDIA PROBE AMP, GENITAL
Chlamydia, DNA Probe: NEGATIVE
GC Probe Amp, Genital: NEGATIVE

## 2010-10-02 LAB — POCT URINALYSIS DIPSTICK
Bilirubin Urine: NEGATIVE
Glucose, UA: NEGATIVE mg/dL
Hgb urine dipstick: NEGATIVE
Ketones, ur: NEGATIVE mg/dL
Nitrite: NEGATIVE
Protein, ur: NEGATIVE mg/dL
Specific Gravity, Urine: 1.025 (ref 1.005–1.030)
Urobilinogen, UA: 1 mg/dL (ref 0.0–1.0)
pH: 6 (ref 5.0–8.0)

## 2010-10-02 LAB — POCT PREGNANCY, URINE: Preg Test, Ur: NEGATIVE

## 2010-10-02 LAB — WET PREP, GENITAL
Trich, Wet Prep: NONE SEEN
Yeast Wet Prep HPF POC: NONE SEEN

## 2010-10-02 LAB — GC/CHLAMYDIA PROBE AMP, GENITAL
Chlamydia, DNA Probe: POSITIVE — AB
GC Probe Amp, Genital: POSITIVE — AB

## 2010-10-13 ENCOUNTER — Ambulatory Visit (INDEPENDENT_AMBULATORY_CARE_PROVIDER_SITE_OTHER): Payer: Medicaid Other | Admitting: Family Medicine

## 2010-10-13 ENCOUNTER — Encounter: Payer: Self-pay | Admitting: Family Medicine

## 2010-10-13 ENCOUNTER — Telehealth: Payer: Self-pay | Admitting: Family Medicine

## 2010-10-13 VITALS — BP 135/86 | HR 88 | Temp 98.4°F | Ht 65.0 in | Wt 151.8 lb

## 2010-10-13 DIAGNOSIS — N760 Acute vaginitis: Secondary | ICD-10-CM

## 2010-10-13 DIAGNOSIS — F339 Major depressive disorder, recurrent, unspecified: Secondary | ICD-10-CM

## 2010-10-13 LAB — POCT WET PREP (WET MOUNT)
Trichomonas Wet Prep HPF POC: NEGATIVE
Yeast Wet Prep HPF POC: NEGATIVE

## 2010-10-13 MED ORDER — METRONIDAZOLE 0.75 % VA GEL: VAGINAL | Status: DC

## 2010-10-13 NOTE — Assessment & Plan Note (Signed)
Discussed with patient will prescribe 7 days of metrogel, then maintenance with twice weekly.

## 2010-10-13 NOTE — Progress Notes (Signed)
  Subjective:    Patient ID: Yesenia Weeks, female    DOB: 08-05-1975, 35 y.o.   MRN: [id number]  HPI VAGINAL DISCHARGE  Onset: chronic  Description: white Odor: yes  Itching: yes  Symptoms Dysuria: no  Bleeding: no  Pelvic pain: no  Back pain: no  Fever: no  Genital sores: no  Rash: no  Dyspareunia: no  GI Sxs: yes  Prior treatment: yes, has had chronic BV in past, also STD's/   Red Flags: Missed period: no  Pregnancy: no  Recent antibiotics: no  Sexual activity: yes  Possible STD exposure: yes , uses condoms IUD: no  Diabetes: no    Anxiety:  States anxiety has been worsening.  Is not currently taking any emdication.  Saw [facility] and was referred to [facility] health for medication mgt.  Will see them in 4 days.  Review of Systems     Objective:   Physical Exam  Genitourinary: Vaginal discharge found.       Thin grey vaginal discharge. Cervix and vaginal wnl.  No CMT.  Uterus and ovaries normal to palpation          Assessment & Plan:

## 2010-10-13 NOTE — Telephone Encounter (Signed)
Left message for her to discuss with psychiatry at her appt on tuesday

## 2010-10-13 NOTE — Patient Instructions (Signed)
Will look at tests under microscope to see if any BV present. I will call you with results. Some vaginal discharge and odor is normal during different time in your cycle I am so glad you are getting help with your anxiety!

## 2010-10-13 NOTE — Assessment & Plan Note (Signed)
Worsening anxiety with panic attacks. Was evaluated by [facility] and per patient received diagnoses of anxiety, depression, and ADHD.  Has appointment with [facility] for medication management.  Encouraged her to keep this appointment.

## 2010-10-13 NOTE — Telephone Encounter (Signed)
Pt seen today for anxiety, but forgot to ask for a rx to be called to [facility] on [address]

## 2010-10-14 LAB — GC/CHLAMYDIA PROBE AMP, GENITAL
Chlamydia, DNA Probe: NEGATIVE
GC Probe Amp, Genital: NEGATIVE

## 2010-10-16 ENCOUNTER — Encounter: Payer: Self-pay | Admitting: Family Medicine

## 2010-10-17 ENCOUNTER — Ambulatory Visit (HOSPITAL_COMMUNITY): Payer: Medicaid Other | Admitting: Physician Assistant

## 2010-10-17 DIAGNOSIS — F329 Major depressive disorder, single episode, unspecified: Secondary | ICD-10-CM

## 2010-10-24 ENCOUNTER — Encounter (HOSPITAL_COMMUNITY): Payer: Medicaid Other | Admitting: Physician Assistant

## 2010-10-25 ENCOUNTER — Encounter: Payer: Medicaid Other | Admitting: Family Medicine

## 2010-11-03 ENCOUNTER — Ambulatory Visit (HOSPITAL_COMMUNITY): Payer: Medicaid Other | Admitting: Psychology

## 2010-11-10 ENCOUNTER — Encounter: Payer: Medicaid Other | Admitting: Family Medicine

## 2010-11-14 ENCOUNTER — Encounter (HOSPITAL_COMMUNITY): Payer: Medicaid Other | Admitting: Physician Assistant

## 2010-11-15 ENCOUNTER — Other Ambulatory Visit (HOSPITAL_COMMUNITY)
Admission: RE | Admit: 2010-11-15 | Discharge: 2010-11-15 | Disposition: A | Discharge status: Home / Self Care | Payer: Medicaid Other | Source: Ambulatory Visit | Attending: Family Medicine | Admitting: Family Medicine

## 2010-11-15 ENCOUNTER — Encounter: Payer: Self-pay | Admitting: Family Medicine

## 2010-11-15 ENCOUNTER — Ambulatory Visit (INDEPENDENT_AMBULATORY_CARE_PROVIDER_SITE_OTHER): Payer: Medicaid Other | Admitting: Family Medicine

## 2010-11-15 DIAGNOSIS — Z01419 Encounter for gynecological examination (general) (routine) without abnormal findings: Secondary | ICD-10-CM | POA: Insufficient documentation

## 2010-11-15 DIAGNOSIS — F339 Major depressive disorder, recurrent, unspecified: Secondary | ICD-10-CM

## 2010-11-15 DIAGNOSIS — Z202 Contact with and (suspected) exposure to infections with a predominantly sexual mode of transmission: Secondary | ICD-10-CM

## 2010-11-15 DIAGNOSIS — Z0000 Encounter for general adult medical examination without abnormal findings: Secondary | ICD-10-CM

## 2010-11-15 DIAGNOSIS — N926 Irregular menstruation, unspecified: Secondary | ICD-10-CM

## 2010-11-15 DIAGNOSIS — Z124 Encounter for screening for malignant neoplasm of cervix: Secondary | ICD-10-CM

## 2010-11-15 DIAGNOSIS — N760 Acute vaginitis: Secondary | ICD-10-CM

## 2010-11-15 DIAGNOSIS — D509 Iron deficiency anemia, unspecified: Secondary | ICD-10-CM

## 2010-11-15 DIAGNOSIS — N946 Dysmenorrhea, unspecified: Secondary | ICD-10-CM

## 2010-11-15 LAB — POCT WET PREP (WET MOUNT)
Clue Cells Wet Prep HPF POC: NEGATIVE
Trichomonas Wet Prep HPF POC: NEGATIVE

## 2010-11-15 LAB — POCT URINE PREGNANCY: Preg Test, Ur: NEGATIVE

## 2010-11-15 MED ORDER — NORGESTIMATE-ETH ESTRADIOL 0.25-35 MG-MCG PO TABS: 1.0000 | ORAL_TABLET | Freq: Every day | ORAL | Status: DC

## 2010-11-15 MED ORDER — THERA VITAL M PO TABS: 1.0000 | ORAL_TABLET | Freq: Every day | ORAL | Status: AC

## 2010-11-15 NOTE — Patient Instructions (Addendum)
We will call you about lab results or send a letter if everything is normal Please call back with your medications Start the birth control daily Start the multivitamins  Take a daily walk Remember you can call the clinic if you are feeling overwhelmed or call 911 if needed Please refrain from taking your sleeping pills and alcohol together Follow up with your psychiatrist as scheduled

## 2010-11-16 LAB — CBC
HCT: 40.3 % (ref 36.0–46.0)
Hemoglobin: 12.6 g/dL (ref 12.0–15.0)
MCH: 26.5 pg (ref 26.0–34.0)
MCHC: 31.3 g/dL (ref 30.0–36.0)
MCV: 84.8 fL (ref 78.0–100.0)
Platelets: 233 10*3/uL (ref 150–400)
RBC: 4.75 MIL/uL (ref 3.87–5.11)
RDW: 12.9 % (ref 11.5–15.5)
WBC: 9 10*3/uL (ref 4.0–10.5)

## 2010-11-16 LAB — HIV ANTIBODY (ROUTINE TESTING W REFLEX): HIV: NONREACTIVE

## 2010-11-16 LAB — RPR

## 2010-11-16 LAB — GC/CHLAMYDIA PROBE AMP, GENITAL
Chlamydia, DNA Probe: NEGATIVE
GC Probe Amp, Genital: NEGATIVE

## 2010-11-17 ENCOUNTER — Telehealth: Payer: Self-pay | Admitting: Family Medicine

## 2010-11-17 ENCOUNTER — Encounter: Payer: Self-pay | Admitting: Family Medicine

## 2010-11-17 ENCOUNTER — Ambulatory Visit (HOSPITAL_COMMUNITY): Payer: Medicaid Other | Admitting: Psychology

## 2010-11-17 MED ORDER — CITALOPRAM HYDROBROMIDE 20 MG PO TABS: 20.0000 mg | ORAL_TABLET | Freq: Every day | ORAL | Status: DC

## 2010-11-17 MED ORDER — MIRTAZAPINE 15 MG PO TABS: 15.0000 mg | ORAL_TABLET | Freq: Every day | ORAL | Status: DC

## 2010-11-17 NOTE — Telephone Encounter (Signed)
Called in to let Dr Jeanice Lim know the meds she got from [facility] - Dr Dalene Seltzer Mirtazapine 15mg  - 1@ PM Citalopram HBR 20mg  - 1@am 

## 2010-11-17 NOTE — Assessment & Plan Note (Signed)
Check labs 

## 2010-11-17 NOTE — Progress Notes (Signed)
  Subjective:    Patient ID: Yesenia Weeks, female    DOB: 11-Apr-1976, 35 y.o.   MRN: [id number]  HPI  Pt here for CPE/PAP - my first visit with pt   Concerns: Irregular bleeding- she has had irregular menses since childhood- has 3 children, had 2 episodes of spotting this month, both short in duration, sexually active, no N/V, abd pain, no discharge causing discomoft at this time, no change in bowels. Concerned about pregnancy, no dysuria  CPE- Overdue for PAP, history of abnormal PAP in past, but pt could not recall any details           Smokes when she drinks- see below  Depression- I have received many notes from [facility], but had not seen pt before. She has severe depression, low GAF score, anxiety, ADHD, with low intelligence scores and concern for bipolar. She has been depressed for many years. 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 She is not able to hold a job secondary to her mental illness and often she can not understand the work per report She was started on Remeron and Celexa but lost her refill for celexa.  She is now going to see a cone specialist. No current SI, but feels very overwhelmed and does not know what her future holds She is also following with a counselor. For insomnia given Remeron, as she often drinks 40 onces of ETOH every night along with tobacco as her night cap to help her rest. Has been a few days without ETOH, without withdrawel and tries not to drink and take her meds at the same time.    Review of Systems  Per above     Objective:   Physical Exam  Constitutional: She is oriented to person, place, and time. She appears well-developed and well-nourished. No distress.  HENT:  Right Ear: External ear normal.  Left Ear: External ear  normal.  Mouth/Throat: Oropharynx is clear and moist.  Eyes: EOM are normal. Pupils are equal, round, and reactive to light. No scleral icterus.  Neck: Normal range of motion. Neck supple. No thyromegaly present.  Cardiovascular: Normal rate, regular rhythm and normal heart sounds.   No murmur heard. Pulmonary/Chest: Effort normal and breath sounds normal.  Abdominal: Soft. Bowel sounds are normal. She exhibits no distension and no mass. There is no tenderness.  Genitourinary: Vagina normal and uterus normal.       No CMT, No adnexal masses, or tenderness, no bleeding from , min thin discharge , no bleeding noted, uterus normal size  Neurological: She is alert and oriented to person, place, and time.  Psychiatric:       Depressed appearing Good eye contact Normal thought process Pt could not recall much from the past, could not recall name of her medications, states her memory is not very well No agitation Speech normal          Assessment & Plan:

## 2010-11-17 NOTE — Assessment & Plan Note (Signed)
I reviewed Behavioral Health notes, from [facility], she is now seeing a psychiatrist at [facility], will give refill on meds until she sees them in 2 weeks

## 2010-11-17 NOTE — Telephone Encounter (Signed)
Noted, I will call in a refill for her

## 2010-11-17 NOTE — Assessment & Plan Note (Addendum)
Pt has recurrent BV, has been using boric acid in the past, Metrogel, rephresh. Currently with little symptoms will check wet prep, GC/chlamydia as pt desired  Try Clindamycin suppositories

## 2010-11-17 NOTE — Assessment & Plan Note (Signed)
Long standing problem since adolescence. Will give trial of OCP to monitor response Exam benign. Next step Ultrasound, ? PCOS

## 2010-11-22 ENCOUNTER — Telehealth: Payer: Self-pay | Admitting: Family Medicine

## 2010-11-22 NOTE — Telephone Encounter (Signed)
Wants to know results of labs  °

## 2010-11-22 NOTE — Telephone Encounter (Signed)
Forward to  

## 2010-11-23 NOTE — Telephone Encounter (Signed)
Called patient informed of lab results.Yesenia Weeks, [name]

## 2010-11-23 NOTE — Telephone Encounter (Signed)
Please let pt know. Her PAP Smear is normal Her HIV, Syphillis Screen is negative Her Gonorrhea/Chlamydia screen is Neg Her blood counts were normal- she is not anemic

## 2010-11-28 ENCOUNTER — Ambulatory Visit (HOSPITAL_COMMUNITY): Payer: Medicaid Other | Admitting: Psychology

## 2010-12-06 ENCOUNTER — Encounter (HOSPITAL_COMMUNITY): Payer: Medicaid Other | Admitting: Physician Assistant

## 2010-12-08 ENCOUNTER — Ambulatory Visit (HOSPITAL_COMMUNITY): Payer: Medicaid Other | Admitting: Psychology

## 2010-12-08 NOTE — Op Note (Signed)
[place]. [facility]  Patient:    Yesenia Weeks, Yesenia Weeks                         MRN: [id number] Proc. Date: 08/19/00 Adm. Date:  [id number] Attending:  Sonda Primes                           Operative Report  PREOPERATIVE DIAGNOSIS:  Left groin hernia.  POSTOPERATIVE DIAGNOSIS:  Left inguinal hernia.  PROCEDURE:  Left inguinal herniorrhaphy.  SURGEON:  Mardene Celeste. Lurene Shadow, M.D.  ASSISTANT:  Nurse.  ANESTHESIA:  General.  INDICATIONS:  The patient is 35 years old, presenting with a left-sided groin bulge low and close to the pubic tubercle.  It was hard to decide whether this was a femoral or inguinal hernia.  She is brought to the operating room now for this symptomatic hernia.  DESCRIPTION OF PROCEDURE:  Following the induction of anesthesia, the patient was positioned supinely and the left groin prepped and draped to be included in the sterile operative field.  I made a transverse incision just superior to the region where the external inguinal ring is, deepened this through the skin and subcutaneous tissues and down to the external oblique aponeurosis.  The external inguinal ring was opened up with protection of the ilioinguinal nerve. Dissection carried down on the round ligament.  This was indeed an inguinal hernia.  The round ligament was held with a Penrose drain and then the sac dissected free from the round ligament up to the internal ring.  The sac was opened and no intra-abdominal contents noted.  The sac was then doubly suture ligated with silk sutures, redundant sac amputated and forwarded for pathologic evaluation.  The round ligament was clamped both proximally and distally, excised and removed, and sent along with the specimen.  The round ligament was secured with ties of 2-0 silk.  The internal ring was then closed with a running suture of 2-0 Novofil, and the entire inguinal floor was reinforced with a running suture of 2-0 Novofil.   The sponge, instrument, and sharp counts were then verified.  The external oblique aponeurosis closed with running 2-0 Vicryl suture, Scarpas fascia and subcutaneous tissue was closed with running 3-0 Vicryl suture, and the skin closed with a running 4-0 Monocryl suture and then reinforced with Steri-Strips.  A sterile dressing was applied.  Anesthetic reversed.  The patient removed from the operating room to the recovery room in stable condition, having tolerated the procedure well. DD:  08/19/00 TD:  08/19/00 Job: [id number]

## 2011-04-16 LAB — POCT URINALYSIS DIP (DEVICE)
Bilirubin Urine: NEGATIVE
Glucose, UA: NEGATIVE
Hgb urine dipstick: NEGATIVE
Ketones, ur: NEGATIVE
Nitrite: NEGATIVE
Operator id: 126491
Protein, ur: NEGATIVE
Specific Gravity, Urine: 1.02
Urobilinogen, UA: 0.2
pH: 7

## 2011-04-16 LAB — WET PREP, GENITAL
Trich, Wet Prep: NONE SEEN
Yeast Wet Prep HPF POC: NONE SEEN

## 2011-04-16 LAB — POCT PREGNANCY, URINE
Operator id: 12649
Preg Test, Ur: NEGATIVE

## 2011-04-16 LAB — GC/CHLAMYDIA PROBE AMP, GENITAL
Chlamydia, DNA Probe: POSITIVE — AB
GC Probe Amp, Genital: NEGATIVE

## 2011-04-27 ENCOUNTER — Ambulatory Visit: Payer: Medicaid Other | Admitting: Family Medicine

## 2011-05-06 ENCOUNTER — Inpatient Hospital Stay (INDEPENDENT_AMBULATORY_CARE_PROVIDER_SITE_OTHER)
Admission: RE | Admit: 2011-05-06 | Discharge: 2011-05-06 | Disposition: A | Discharge status: Home / Self Care | Payer: Medicaid Other | Source: Ambulatory Visit | Attending: Emergency Medicine | Admitting: Emergency Medicine

## 2011-05-06 DIAGNOSIS — A499 Bacterial infection, unspecified: Secondary | ICD-10-CM

## 2011-05-06 DIAGNOSIS — N760 Acute vaginitis: Secondary | ICD-10-CM

## 2011-05-06 LAB — POCT URINALYSIS DIP (DEVICE)
Bilirubin Urine: NEGATIVE
Glucose, UA: NEGATIVE mg/dL
Hgb urine dipstick: NEGATIVE
Ketones, ur: NEGATIVE mg/dL
Leukocytes, UA: NEGATIVE
Nitrite: NEGATIVE
Protein, ur: NEGATIVE mg/dL
Specific Gravity, Urine: 1.025 (ref 1.005–1.030)
Urobilinogen, UA: 0.2 mg/dL (ref 0.0–1.0)
pH: 6.5 (ref 5.0–8.0)

## 2011-05-06 LAB — WET PREP, GENITAL
Trich, Wet Prep: NONE SEEN
Yeast Wet Prep HPF POC: NONE SEEN

## 2011-05-06 LAB — POCT PREGNANCY, URINE: Preg Test, Ur: NEGATIVE

## 2011-05-06 LAB — HIV ANTIBODY (ROUTINE TESTING W REFLEX): HIV: NONREACTIVE

## 2011-05-07 LAB — GC/CHLAMYDIA PROBE AMP, GENITAL
Chlamydia, DNA Probe: NEGATIVE
GC Probe Amp, Genital: NEGATIVE

## 2011-05-18 ENCOUNTER — Ambulatory Visit: Payer: Medicaid Other | Admitting: Family Medicine

## 2011-05-30 ENCOUNTER — Encounter: Payer: Self-pay | Admitting: Family Medicine

## 2011-05-30 ENCOUNTER — Ambulatory Visit (INDEPENDENT_AMBULATORY_CARE_PROVIDER_SITE_OTHER): Payer: Medicaid Other | Admitting: Family Medicine

## 2011-05-30 VITALS — BP 113/76 | HR 72 | Temp 97.2°F | Ht 66.0 in | Wt 140.0 lb

## 2011-05-30 DIAGNOSIS — J069 Acute upper respiratory infection, unspecified: Secondary | ICD-10-CM

## 2011-05-30 DIAGNOSIS — J029 Acute pharyngitis, unspecified: Secondary | ICD-10-CM

## 2011-05-30 DIAGNOSIS — Z309 Encounter for contraceptive management, unspecified: Secondary | ICD-10-CM

## 2011-05-30 LAB — POCT RAPID STREP A (OFFICE): Rapid Strep A Screen: NEGATIVE

## 2011-05-30 LAB — POCT URINE PREGNANCY: Preg Test, Ur: NEGATIVE

## 2011-05-30 MED ORDER — BENZONATATE 100 MG PO CAPS: 100.0000 mg | ORAL_CAPSULE | Freq: Three times a day (TID) | ORAL | Status: AC | PRN

## 2011-05-30 NOTE — Patient Instructions (Signed)
Use chloraseptic spray and take the Tessaslon as needed for cough and sore throat  If you are not better in 1 week or well in 2 weeks or get high fever or shortness of breath then return  Stopping smoking would help you get better faster  Come back to see your regular doctor in 1-2 weeks for depo and Use condoms until then.

## 2011-05-30 NOTE — Assessment & Plan Note (Signed)
Has been off BCPs for months.  Is interested in Depo. Had sex yesterday using condoms.  Will check upreg now and asked her to come back in 1 week for repeat and if negative depo.  Asked to follow up with her PCP

## 2011-05-30 NOTE — Assessment & Plan Note (Addendum)
New in smoker without signs of bacterial infection.  Treat symptomatically.

## 2011-05-30 NOTE — Progress Notes (Signed)
  Subjective:    Patient ID: Yesenia Weeks, female    DOB: [birth year], 35 y.o.   MRN: [id number]  HPI UPPER RESPIRATORY INFECTION  Onset: 1 weeks ago  Course: was better now worsing Better with: ibuprofen for sore throat    Sick contacts: many  Nasal discharge (color,laterality): clear  Sinusitis Risk Factors Fever: no   Headache/face pain: no  Double sickening: yes  Tooth pain: no   Allergy Risk Factors: Sneezing: yes  Itchy scratchy throat: yes  Seasonal sx: no   Flu Risk Factors Headache: no  Muscle aches: no  Severe fatigue: no    Red Flags  Stiff neck: no  Dyspnea: no  Rash: no  Swallowing difficulty: yes, mild    Review of Symptoms - see HPI  PMH - Smoking status noted.  Yes    Review of Systems     Objective:   Physical Exam   no apparent distress Lungs:  Normal respiratory effort, chest expands symmetrically. Lungs are clear to auscultation, no crackles or wheezes. Heart - Regular rate and rhythm.  No murmurs, gallops or rubs.    Skin:  Intact without suspicious lesions or rashes is dry Neck:  No deformities, thyromegaly, masses, or tenderness noted.   Supple with full range of motion without pain. Ears:  External ear exam shows no significant lesions or deformities.  Otoscopic examination reveals wax in canals, tympanic membranes are intact bilaterally without bulging, retraction, inflammation or discharge. Hearing is grossly normal bilaterall Throat: red mucosa, no exudate, uvula midline, no redness      Assessment & Plan:

## 2011-06-11 ENCOUNTER — Ambulatory Visit: Payer: Medicaid Other | Admitting: Family Medicine

## 2011-07-04 ENCOUNTER — Encounter: Payer: Self-pay | Admitting: Family Medicine

## 2011-07-04 ENCOUNTER — Ambulatory Visit (INDEPENDENT_AMBULATORY_CARE_PROVIDER_SITE_OTHER): Payer: Medicaid Other | Admitting: Family Medicine

## 2011-07-04 DIAGNOSIS — Z309 Encounter for contraceptive management, unspecified: Secondary | ICD-10-CM

## 2011-07-04 DIAGNOSIS — F17200 Nicotine dependence, unspecified, uncomplicated: Secondary | ICD-10-CM

## 2011-07-04 DIAGNOSIS — F339 Major depressive disorder, recurrent, unspecified: Secondary | ICD-10-CM

## 2011-07-04 DIAGNOSIS — N760 Acute vaginitis: Secondary | ICD-10-CM

## 2011-07-04 DIAGNOSIS — Z23 Encounter for immunization: Secondary | ICD-10-CM

## 2011-07-04 DIAGNOSIS — J309 Allergic rhinitis, unspecified: Secondary | ICD-10-CM

## 2011-07-04 DIAGNOSIS — N898 Other specified noninflammatory disorders of vagina: Secondary | ICD-10-CM

## 2011-07-04 LAB — POCT WET PREP (WET MOUNT)
Bacteria Wet Prep HPF POC: INCREASED
Clue Cells Wet Prep HPF POC: NEGATIVE
Trichomonas Wet Prep HPF POC: NEGATIVE
WBC, Wet Prep HPF POC: INCREASED

## 2011-07-04 LAB — POCT URINE PREGNANCY: Preg Test, Ur: NEGATIVE

## 2011-07-04 MED ORDER — METRONIDAZOLE 0.75 % VA GEL: VAGINAL | Status: DC

## 2011-07-04 MED ORDER — FEXOFENADINE HCL 60 MG PO TABS: 60.0000 mg | ORAL_TABLET | Freq: Every day | ORAL | Status: DC

## 2011-07-04 MED ORDER — METRONIDAZOLE ER 750 MG PO TB24: 750.0000 mg | ORAL_TABLET | Freq: Every day | ORAL | Status: DC

## 2011-07-04 MED ORDER — MEDROXYPROGESTERONE ACETATE 150 MG/ML IM SUSP
150.0000 mg | Freq: Once | INTRAMUSCULAR | Status: AC
  Administered 2011-07-04: 150 mg via INTRAMUSCULAR

## 2011-07-04 NOTE — Assessment & Plan Note (Addendum)
No BV on wet prep today, but pt currently using metronidazole vaginal suppository, and used one last night.  No treatment at this time.  If pt returns with BV, per uptodate, consider: "Results can be improved by adding vaginal boric acid to the oral nitroimidazole induction therapy [105]. Metronidazole or tinidazole is taken orally for 7 days; vaginal boric acid 600 mg is begun at the same time and continued for 21 days. Patients are seen for follow-up a day or two after their last boric acid dose; if they are in remission, we immediately begin metronidazole gel twice weekly for four to six months as suppressive therapy."

## 2011-07-04 NOTE — Assessment & Plan Note (Signed)
Pt occasionally taking children's Claritin for relief.  Will Rx allergra.

## 2011-07-04 NOTE — Assessment & Plan Note (Signed)
Patient counseled on the importance of smoking cessation. She is not yet interested in cutting down or quitting.1-800 helpline information given.

## 2011-07-04 NOTE — Progress Notes (Signed)
S: Pt comes in today for meet new MD, follow up.  MENTAL HEALTH Patient reports a history of depression, social anxiety, and ADHD. She's been on Celexa and Remeron as well as ADHD medicine in the past. She reports that she was going to order stone and then switched to the [facility] long outpatient clinic but has not been seen for many months there. She reports that she has not been on any medications for the past few months. She states that she feels like she needs to restart these medicines because her anxiety has worsened and she is now taking online classes and is having difficulty concentrating. She reports no suicidal or homicidal ideation. She is interested in restarting medications.  CHRONIC BV Patient reports bacterial vaginosis infections since she was a teenager. She states that she most recently had an infection a few months ago and was given 7 days of Flagyl. She did not notice that on the prescription, she was also supposed to use vaginal suppositories twice a week for 3 months. She started doing that a few months ago when she had another flare. She only has one or 2 of these vaginal suppositories left and feels like she has another infection. She is currently sexually active but feels like her BP is irritated by condoms. She's never tried nonlatex condoms. She is not concerned for STDs at this time. She reports vaginal irritation discharge. She's not had any fevers or chills. She has not had any pelvic pain.  ALLERGIES Patient reports a history of seasonal allergies treated with Allegra in the past. She reports frequent sneezing and runny nose. She states she has an occasional cough and sore throat but attributes this to postnasal drip. She's not had any fevers or chills. Has not had any shortness of breath or chest pain. She reports that she will occasionally take one of her children's Claritin and this seems to help. She like to be started on a medicine for her seasonal allergies since it is  becoming an annoyance.  BIRTH CONTROL Patient is interested in starting birth control. She was started on OCPs a month or so ago when she was seen in clinic but reports that she is very forgetful and does not feel like this is a good method of birth control for her. She states that she's been on Depo in the past and tolerated it well. She would like to restart Depo today. She is amenable to having a pregnancy test done.   ROS: Per HPI  History  Smoking status  . Current Some Day Smoker  Smokeless tobacco  . Not on file   patient reports smoking a variable amount of tobacco. Sometimes one pack may last a week if she's not drinking. She smokes cigarettes more frequently when she is drinking. She reports that she uses alcohol almost or at least every other day with specifically for help with sleep. She often drinks at least 40 ounces of beer at a time when drinking. She reports a lower intake of liquor and wine. She states that her mental health provider in the past told her that she had a dependence on alcohol. She does not wish to cut down on her drinking or her smoking.  O:  Filed Vitals:   07/04/11 0924  BP: 122/80  Pulse: 77  Temp: 98.2 F (36.8 C)    Gen: NAD CV: RRR, no murmur Pulm: CTA bilat, no wheezes or crackles Abd: soft, NT Ext: Warm, no chronic skin changes, no edema  Pelvic: normal vagina, vulva, no lesions, cervix normal, clumpy white discharge around cervical os and in vaginal canal, cervix non friable with smooth pink mucosa    A/P: 35 y.o. female p/w mood d/o, vaginal d/c, contraceptive mgmt -See problem list -f/u in 2 months

## 2011-07-04 NOTE — Patient Instructions (Addendum)
It was nice to meet you today! We did a wet prep which does not show BV.  This may be because you are using the vaginal gel.  Once you are finished with that, give your body a few weeks to readjust.  If you start having discharge, come back here so we can do another wet prep and start you on by mouth and vaginal medicines to treat both the acute flare and for prophylaxis to help prevent further flares.  I recommend that you meet with our psychologist, Dr. Spero Geralds, for help dealing with your anxiety, depression, ADHD.  You can schedule an appointment with her by calling her directly at [phone]. I sent in a prescription for your allergies. Take 1 pill once a day.  Please think about quitting smoking.  This is very important for your health.  There are many things we can do to help you quit.  Let  us know if you are interested.  You can also call 1-800-QUIT-NOW 573-643-6451) for free smoking cessation counseling. Please come back and see me in 3 month for your next depo shot and so we can see if your BV is in remission.

## 2011-07-04 NOTE — Assessment & Plan Note (Signed)
Pt is not currently on any medicines and reports extensive history of depression, anxiety, ADHD.  Offered pt to return to [facility] vs [facility] vs me.  Pt and I decided [facility] would be most appropriate.  She was given information and will call to schedule appt for medication mgmt.

## 2011-07-04 NOTE — Assessment & Plan Note (Signed)
Upreg negative. Depo given. Next shot due in 3 months.

## 2011-07-26 ENCOUNTER — Telehealth: Payer: Self-pay | Admitting: Family Medicine

## 2011-07-26 NOTE — Telephone Encounter (Signed)
Fwd. To PCP for info .Yesenia Weeks  

## 2011-07-26 NOTE — Telephone Encounter (Signed)
Yesenia Weeks is calling to let the MD know that she has started taking the Flagyl.

## 2011-08-06 NOTE — ED Notes (Signed)
Pt. mame in to see her HIV result. Unable to find and pull record before she had to leave. Pt. was here 05/06/11. I called pt. and told her I had found it and she can come tomorrow to view her result with a picture ID. I gave pt. my schedule M-F [id number]. Vassie Moselle 08/06/2011

## 2011-08-07 ENCOUNTER — Telehealth: Payer: Self-pay | Admitting: Family Medicine

## 2011-08-07 NOTE — Telephone Encounter (Signed)
Ms. Duffee want to get a call back to advise her as to what will be the next step after taking med if she is still having problem.  Need referral to gyn specialist.  Call patient on mother's cell # [phone] if not calling back today.

## 2011-08-07 NOTE — ED Notes (Signed)
Pt. came in with picture ID to see her HIV result. ID verified and pt. shown her neg. result.  Pt. wants copies of her labs. Pt.'s ID copied while she filled out the medical release form. Pt. given her labs in a brown envelope. Vassie Moselle 08/07/2011

## 2011-08-07 NOTE — Telephone Encounter (Signed)
Returned patient's call.  Finished Flagyl around 1/5 or 07/29/11 and is still having vaginal irritation (warm feeling on the inside of vaginal canal).  Decreased vaginal discharge.  Has not noticed any redness. Would like to know if she needs to see an OB/GYN since this is a chronic problem that she has been having since she was a teenager.  Advised pt to return for visit and to avoid any OTC products in her vagina until she is seen.  We will do pelvic exam, wet prep, +/- STD screening and consider GYN referral at that time.  Patient will call clinic to schedule appt with me.

## 2011-08-09 ENCOUNTER — Ambulatory Visit (INDEPENDENT_AMBULATORY_CARE_PROVIDER_SITE_OTHER): Payer: Medicaid Other | Admitting: Family Medicine

## 2011-08-09 ENCOUNTER — Encounter: Payer: Self-pay | Admitting: Family Medicine

## 2011-08-09 VITALS — BP 121/76 | HR 84 | Temp 98.6°F | Ht 66.0 in | Wt 156.0 lb

## 2011-08-09 DIAGNOSIS — N760 Acute vaginitis: Secondary | ICD-10-CM

## 2011-08-09 LAB — POCT WET PREP (WET MOUNT)
Clue Cells Wet Prep HPF POC: NEGATIVE
Trichomonas Wet Prep HPF POC: NEGATIVE
Yeast Wet Prep HPF POC: NEGATIVE

## 2011-08-09 NOTE — Progress Notes (Signed)
  Subjective:    Patient ID: Yesenia Weeks, female    DOB: 09-Aug-1975, 36 y.o.   MRN: [id number]  HPI Patient here today for vaginal irritation. She feels a sensation of heat and some mild discharge. She states that there is no itching. There is no odor. Last week she took Flagyl x7 days and she's been off of liver one week. She states she got slightly better after finishing Flagyl was never back to normal. She states that these infections have been recurrent. She would like to go to GYN to be evaluated further.   Review of Systems Denies CP, SOB, HA, N/V/D, fever     Objective:   Physical Exam Vital signs reviewed General appearance - alert, well appearing, and in no distress and oriented to person, place, and time GYN-there is no redness of the vulva. There is scant thick white discharge present vaginally. There is some blood present as well. Cervix looks normal.       Assessment & Plan:

## 2011-08-09 NOTE — Assessment & Plan Note (Signed)
Recurrent BV infections. Patient desires referral to GYN. We'll put that in today. Wet prep today. Depending on results we will consider metronidazole plus  boric acid.

## 2011-08-10 ENCOUNTER — Telehealth: Payer: Self-pay | Admitting: Family Medicine

## 2011-08-10 NOTE — Telephone Encounter (Signed)
Spoke with patient today. Wet prep was negative. We will not try another course of antibiotics at this time. Discussed her symptoms irritation. She will consider trying a nonlatex type of condom.

## 2011-08-15 ENCOUNTER — Telehealth: Payer: Self-pay | Admitting: *Deleted

## 2011-08-15 NOTE — Telephone Encounter (Signed)
Patient calls stating she started Depo  07/04/2011. Starting the first week of January  she started having some spotting. Sometimes requires a pad for a day other times just a small amount of dark brown substance when she wipes. Denies any cramping or discomfort . At one time did have a sharp pain but not any pain now. Came in last week and was checked for BV. Advised hat when starting depo it is not uncommon to have some spotting as body  and hormones are adjusting .  If becomes heavy or really worrisome  to come in to see MD. Should gradually subside. Will forward to Dr. Fara Boros to ask if she has any further instructions.

## 2011-08-15 NOTE — Telephone Encounter (Signed)
Sounds good.  She should be seen if she has heavy bleeding, otherwise this is likely secondary to Depo. Thanks!

## 2011-09-28 ENCOUNTER — Encounter: Payer: Medicaid Other | Admitting: Obstetrics and Gynecology

## 2011-10-02 ENCOUNTER — Telehealth: Payer: Self-pay | Admitting: Family Medicine

## 2011-10-02 ENCOUNTER — Ambulatory Visit (INDEPENDENT_AMBULATORY_CARE_PROVIDER_SITE_OTHER): Payer: Medicaid Other | Admitting: *Deleted

## 2011-10-02 DIAGNOSIS — Z309 Encounter for contraceptive management, unspecified: Secondary | ICD-10-CM

## 2011-10-02 LAB — POCT URINE PREGNANCY: Preg Test, Ur: NEGATIVE

## 2011-10-02 MED ORDER — MEDROXYPROGESTERONE ACETATE 150 MG/ML IM SUSP
150.0000 mg | Freq: Once | INTRAMUSCULAR | Status: AC
  Administered 2011-10-02: 150 mg via INTRAMUSCULAR

## 2011-10-02 NOTE — Telephone Encounter (Signed)
[facility] and thought it was today.  Needs to have our nurse reschedule

## 2011-10-02 NOTE — Telephone Encounter (Signed)
refaxed referral to [facility], patient missed appointment.Aston Lieske, [name]

## 2011-11-01 ENCOUNTER — Ambulatory Visit: Payer: Medicaid Other

## 2011-11-02 ENCOUNTER — Other Ambulatory Visit (HOSPITAL_COMMUNITY)
Admission: RE | Admit: 2011-11-02 | Discharge: 2011-11-02 | Disposition: A | Discharge status: Home / Self Care | Payer: Medicaid Other | Source: Ambulatory Visit | Attending: Family Medicine | Admitting: Family Medicine

## 2011-11-02 ENCOUNTER — Ambulatory Visit (INDEPENDENT_AMBULATORY_CARE_PROVIDER_SITE_OTHER): Payer: Medicaid Other | Admitting: Family Medicine

## 2011-11-02 ENCOUNTER — Encounter: Payer: Self-pay | Admitting: Family Medicine

## 2011-11-02 VITALS — BP 120/85 | HR 80 | Temp 98.2°F | Wt 155.3 lb

## 2011-11-02 DIAGNOSIS — Z113 Encounter for screening for infections with a predominantly sexual mode of transmission: Secondary | ICD-10-CM | POA: Insufficient documentation

## 2011-11-02 DIAGNOSIS — N760 Acute vaginitis: Secondary | ICD-10-CM

## 2011-11-02 LAB — POCT WET PREP (WET MOUNT)

## 2011-11-02 LAB — POCT URINALYSIS DIPSTICK
Bilirubin, UA: NEGATIVE
Blood, UA: NEGATIVE
Glucose, UA: NEGATIVE
Ketones, UA: NEGATIVE
Leukocytes, UA: NEGATIVE
Nitrite, UA: NEGATIVE
Protein, UA: NEGATIVE
Spec Grav, UA: 1.025
Urobilinogen, UA: 0.2
pH, UA: 5.5

## 2011-11-02 MED ORDER — METRONIDAZOLE 1 % EX GEL: Freq: Every day | CUTANEOUS | Status: DC

## 2011-11-02 MED ORDER — METRONIDAZOLE 500 MG PO TABS: 500.0000 mg | ORAL_TABLET | Freq: Three times a day (TID) | ORAL | Status: DC

## 2011-11-02 NOTE — Patient Instructions (Signed)
Bacterial Vaginosis Bacterial vaginosis (BV) is a vaginal infection where the normal balance of bacteria in the vagina is disrupted. The normal balance is then replaced by an overgrowth of certain bacteria. There are several different kinds of bacteria that can cause BV. BV is the most common vaginal infection in women of childbearing age. CAUSES   The cause of BV is not fully understood. BV develops when there is an increase or imbalance of harmful bacteria.   Some activities or behaviors can upset the normal balance of bacteria in the vagina and put women at increased risk including:   Having a new sex partner or multiple sex partners.   Douching.   Using an intrauterine device (IUD) for contraception.   It is not clear what role sexual activity plays in the development of BV. However, women that have never had sexual intercourse are rarely infected with BV.  Women do not get BV from toilet seats, bedding, swimming pools or from touching objects around them.  SYMPTOMS   Grey vaginal discharge.   A fish-like odor with discharge, especially after sexual intercourse.   Itching or burning of the vagina and vulva.   Burning or pain with urination.   Some women have no signs or symptoms at all.  DIAGNOSIS  Your caregiver must examine the vagina for signs of BV. Your caregiver will perform lab tests and look at the sample of vaginal fluid through a microscope. They will look for bacteria and abnormal cells (clue cells), a pH test higher than 4.5, and a positive amine test all associated with BV.  RISKS AND COMPLICATIONS   Pelvic inflammatory disease (PID).   Infections following gynecology surgery.   Developing HIV.   Developing herpes virus.  TREATMENT  Sometimes BV will clear up without treatment. However, all women with symptoms of BV should be treated to avoid complications, especially if gynecology surgery is planned. Female partners generally do not need to be treated. However,  BV may spread between female sex partners so treatment is helpful in preventing a recurrence of BV.   BV may be treated with antibiotics. The antibiotics come in either pill or vaginal cream forms. Either can be used with nonpregnant or pregnant women, but the recommended dosages differ. These antibiotics are not harmful to the baby.   BV can recur after treatment. If this happens, a second round of antibiotics will often be prescribed.   Treatment is important for pregnant women. If not treated, BV can cause a premature delivery, especially for a pregnant woman who had a premature birth in the past. All pregnant women who have symptoms of BV should be checked and treated.   For chronic reoccurrence of BV, treatment with a type of prescribed gel vaginally twice a week is helpful.  HOME CARE INSTRUCTIONS   Finish all medication as directed by your caregiver.   Do not have sex until treatment is completed.   Tell your sexual partner that you have a vaginal infection. They should see their caregiver and be treated if they have problems, such as a mild rash or itching.   Practice safe sex. Use condoms. Only have 1 sex partner.  PREVENTION  Basic prevention steps can help reduce the risk of upsetting the natural balance of bacteria in the vagina and developing BV:  Do not have sexual intercourse (be abstinent).   Do not douche.   Use all of the medicine prescribed for treatment of BV, even if the signs and symptoms go away.     Tell your sex partner if you have BV. That way, they can be treated, if needed, to prevent reoccurrence.  SEEK MEDICAL CARE IF:   Your symptoms are not improving after 3 days of treatment.   You have increased discharge, pain, or fever.  MAKE SURE YOU:   Understand these instructions.   Will watch your condition.   Will get help right away if you are not doing well or get worse.  FOR MORE INFORMATION  Division of STD Prevention (DSTDP), Centers for Disease  Control and Prevention: www.cdc.gov/std American Social Health Association (ASHA): www.ashastd.org  Document Released: 07/09/2005 Document Revised: 06/28/2011 Document Reviewed: 12/30/2008 Exit[employer] Patient Information 2012 [employer]. 

## 2011-11-02 NOTE — Assessment & Plan Note (Signed)
Will treat with oral flagyl and vaginal Metrogel. She has had recurrent infections.  I counseled her on hygiene after sexual activity and maintaining a proper body PH with healthy balanced diet and exercise.

## 2011-11-02 NOTE — Progress Notes (Signed)
  Subjective:    Patient ID: Yesenia Weeks, female    DOB: 1976-01-24, 36 y.o.   MRN: [id number]  HPI 1. Vaginal itching Patient has a history of recurrent BV infections. Treated with boric acid, Metrogel, PO flagyl. She has resolution with these meds, but has recurrence. She is sexually active with condoms and one partner who she believes is faithful. She has been abstinent for the last three months. She does not heavily doosh in the shower, nor dose she think she is allergic to any soap or latex. She does have a high gluten intake: carbs/bread/rice. No diarrhea or rashes. No fevers. No discharge.  Tobacco use: Patient is a non smoker. Review of Systems Pertinent items are noted in HPI.     Objective:   Physical Exam Filed Vitals:   11/02/11 0959  BP: 120/85  Pulse: 80  Temp: 98.2 F (36.8 C)  TempSrc: Oral  Weight: 155 lb 4.8 oz (70.444 kg)  Female genitalia: normal external genitalia, vulva, vagina, cervix, uterus and adnexa Thin white discharge, no foul smell.   Assessment & Plan:

## 2011-11-05 ENCOUNTER — Telehealth: Payer: Self-pay | Admitting: Family Medicine

## 2011-11-05 MED ORDER — METRONIDAZOLE 0.75 % VA GEL: 1.0000 | Freq: Two times a day (BID) | VAGINAL | Status: DC

## 2011-11-05 NOTE — Telephone Encounter (Signed)
Paged Dr. Rivka Safer and he will send in vaginal metrogel now. Patient notified.

## 2011-11-05 NOTE — Telephone Encounter (Signed)
Was supposed to get vaginal Metrogel insert.  Only got a get that is a topical.  pls advise  [facility]- [address]

## 2011-11-09 ENCOUNTER — Ambulatory Visit (INDEPENDENT_AMBULATORY_CARE_PROVIDER_SITE_OTHER): Payer: Medicaid Other | Admitting: Obstetrics and Gynecology

## 2011-11-09 ENCOUNTER — Encounter: Payer: Self-pay | Admitting: Obstetrics and Gynecology

## 2011-11-09 VITALS — BP 123/84 | HR 79 | Temp 98.0°F | Ht 67.0 in | Wt 155.7 lb

## 2011-11-09 DIAGNOSIS — B9689 Other specified bacterial agents as the cause of diseases classified elsewhere: Secondary | ICD-10-CM

## 2011-11-09 DIAGNOSIS — N760 Acute vaginitis: Secondary | ICD-10-CM

## 2011-11-09 DIAGNOSIS — A499 Bacterial infection, unspecified: Secondary | ICD-10-CM

## 2011-11-09 MED ORDER — METRONIDAZOLE 500 MG PO TABS: 500.0000 mg | ORAL_TABLET | Freq: Three times a day (TID) | ORAL | Status: AC

## 2011-11-09 NOTE — Progress Notes (Signed)
Patient ID: Yesenia Weeks, female   DOB: [birth year], 36 y.o.   MRN: [id number] Yesenia Boykin35 [id number]  Chief Complaint  Patient presents with  . Recurrent BV      SUBJECTIVE  HPI: Reports irritative and malodorous vaginal discharge similar to sx when she's had BV in the past. She late gets relief from Flagyl. For some time she's using boric acid tablets which caused watery discharge but helped alleviate sx. She notices her symptoms mostly after having intercourse or menses. She is amenorrheic on Depo-Provera. Denies days next prior to tying STI testing.   Past Medical History  Diagnosis Date  . ADHD (attention deficit hyperactivity disorder)   . Anxiety   . Depression   . Substance abuse   . Anemia     iron def  . Allergy   . Dysmenorrhea   . BV (bacterial vaginosis)     recurrent-uses boric acid   Past Surgical History  Procedure Date  . Hernia repair    History   Social History  . Marital Status: Single    Spouse Name: N/A    Number of Children: N/A  . Years of Education: N/A   Occupational History  . Not on file.   Social History Main Topics  . Smoking status: Current Some Day Smoker  . Smokeless tobacco: Never Used  . Alcohol Use: Yes     40 onces of beer a night  . Drug Use: No  . Sexually Active: Yes    Birth Control/ Protection: Injection   Other Topics Concern  . Not on file   Social History Narrative  . No narrative on file   Current Outpatient Prescriptions on File Prior to Visit  Medication Sig Dispense Refill  . medroxyPROGESTERone (DEPO-PROVERA) 150 MG/ML injection Inject 150 mg into the muscle every 3 (three) months.      . citalopram (CELEXA) 20 MG tablet Take 1 tablet (20 mg total) by mouth daily.  30 tablet  0  . fexofenadine (ALLEGRA) 60 MG tablet Take 1 tablet (60 mg total) by mouth daily.  30 tablet  3  . metroNIDAZOLE (FLAGYL) 500 MG tablet Take 1 tablet (500 mg total) by mouth 3 (three) times daily.  21 tablet  0  . metroNIDAZOLE  (METROGEL VAGINAL) 0.75 % vaginal gel Place 1 Applicatorful vaginally 2 (two) times daily.  70 g  0  . mirtazapine (REMERON) 15 MG tablet Take 1 tablet (15 mg total) by mouth at bedtime. Per BH  30 tablet  0  . Multiple Vitamins-Minerals (MULTIVITAMIN) tablet Take 1 tablet by mouth daily.  30 tablet  11   No Known Allergies  ROS: Pertinent items in HPI  OBJECTIVE Blood pressure 123/84, pulse 79, temperature 98 F (36.7 C), temperature source Oral, height 5\' 7"  (1.702 m), weight 155 lb 11.2 oz (70.625 kg). GENERAL: Well-developed, well-nourished female in no acute distress.  ABDOMEN: Soft, nontender EXTREMITIES: Nontender, no edema SPECULUM EXAM: NEFG, moderate amount gray-white discharge swabbed from the cervix and vault, no blood noted, cervix clean BIMANUAL: cervix multiparous and cleansed; uterus NSSP; no adnexal tenderness or masses    ASSESSMENT Presumptive BV, recurrent  PLAN  Wet prep sent and given a prescription for Flagyl 500 1 PM twice a day for 7 days. At length measures to normalize vaginal pH and she will try vaginal lubricant. If this fails we will consider preventive dosing regimen ofFlagyl  Return if symptoms worsen or fail to improve.    Valerie Fredin 11/09/2011 11:02 AM

## 2011-11-09 NOTE — Patient Instructions (Signed)
bv Bacterial Vaginosis Bacterial vaginosis (BV) is a vaginal infection where the normal balance of bacteria in the vagina is disrupted. The normal balance is then replaced by an overgrowth of certain bacteria. There are several different kinds of bacteria that can cause BV. BV is the most common vaginal infection in women of childbearing age. CAUSES   The cause of BV is not fully understood. BV develops when there is an increase or imbalance of harmful bacteria.   Some activities or behaviors can upset the normal balance of bacteria in the vagina and put women at increased risk including:   Having a new sex partner or multiple sex partners.   Douching.   Using an intrauterine device (IUD) for contraception.   It is not clear what role sexual activity plays in the development of BV. However, women that have never had sexual intercourse are rarely infected with BV.  Women do not get BV from toilet seats, bedding, swimming pools or from touching objects around them.  SYMPTOMS   Grey vaginal discharge.   A fish-like odor with discharge, especially after sexual intercourse.   Itching or burning of the vagina and vulva.   Burning or pain with urination.   Some women have no signs or symptoms at all.  DIAGNOSIS  Your caregiver must examine the vagina for signs of BV. Your caregiver will perform lab tests and look at the sample of vaginal fluid through a microscope. They will look for bacteria and abnormal cells (clue cells), a pH test higher than 4.5, and a positive amine test all associated with BV.  RISKS AND COMPLICATIONS   Pelvic inflammatory disease (PID).   Infections following gynecology surgery.   Developing HIV.   Developing herpes virus.  TREATMENT  Sometimes BV will clear up without treatment. However, all women with symptoms of BV should be treated to avoid complications, especially if gynecology surgery is planned. Female partners generally do not need to be treated.  However, BV may spread between female sex partners so treatment is helpful in preventing a recurrence of BV.   BV may be treated with antibiotics. The antibiotics come in either pill or vaginal cream forms. Either can be used with nonpregnant or pregnant women, but the recommended dosages differ. These antibiotics are not harmful to the baby.   BV can recur after treatment. If this happens, a second round of antibiotics will often be prescribed.   Treatment is important for pregnant women. If not treated, BV can cause a premature delivery, especially for a pregnant woman who had a premature birth in the past. All pregnant women who have symptoms of BV should be checked and treated.   For chronic reoccurrence of BV, treatment with a type of prescribed gel vaginally twice a week is helpful.  HOME CARE INSTRUCTIONS   Finish all medication as directed by your caregiver.   Do not have sex until treatment is completed.   Tell your sexual partner that you have a vaginal infection. They should see their caregiver and be treated if they have problems, such as a mild rash or itching.   Practice safe sex. Use condoms. Only have 1 sex partner.  PREVENTION  Basic prevention steps can help reduce the risk of upsetting the natural balance of bacteria in the vagina and developing BV:  Do not have sexual intercourse (be abstinent).   Do not douche.   Use all of the medicine prescribed for treatment of BV, even if the signs and symptoms go  away.   Tell your sex partner if you have BV. That way, they can be treated, if needed, to prevent reoccurrence.  SEEK MEDICAL CARE IF:   Your symptoms are not improving after 3 days of treatment.   You have increased discharge, pain, or fever.  MAKE SURE YOU:   Understand these instructions.   Will watch your condition.   Will get help right away if you are not doing well or get worse.  FOR MORE INFORMATION  Division of STD Prevention (DSTDP), Centers for  Disease Control and Prevention: [employer]Apps.[employer] American Social Health Association (ASHA): www.ashastd.org  Document Released: 07/09/2005 Document Revised: 06/28/2011 Document Reviewed: 12/30/2008 [facility] Patient Information 2012 [place], [place].

## 2011-11-10 LAB — WET PREP, GENITAL
Clue Cells Wet Prep HPF POC: NONE SEEN
Trich, Wet Prep: NONE SEEN
Yeast Wet Prep HPF POC: NONE SEEN

## 2011-11-28 ENCOUNTER — Ambulatory Visit: Payer: Medicaid Other | Admitting: Advanced Practice Midwife

## 2011-11-29 ENCOUNTER — Ambulatory Visit (INDEPENDENT_AMBULATORY_CARE_PROVIDER_SITE_OTHER): Payer: Medicaid Other | Admitting: Physician Assistant

## 2011-11-29 VITALS — BP 133/83 | HR 82 | Temp 97.3°F | Ht 67.0 in | Wt 158.6 lb

## 2011-11-29 DIAGNOSIS — N760 Acute vaginitis: Secondary | ICD-10-CM

## 2011-11-29 NOTE — Progress Notes (Signed)
Chief Complaint:  Vaginal Discharge   Yesenia Weeks is  36 y.o. [id number].  No LMP recorded. Patient has had an injection.  She presents complaining of Vaginal Discharge Patient states she has had BV for years. She stopped taking her flagyl because it was giving her HA.  Was using Boric acid suppositories "too much" with not relief. Currently having thin white malodorous vaginal discharge . States that any change in soaps, detergents, or shaving cream causes exacerbates her symptoms. Expresses concern over intercourse with a new partner that occurred a week and half ago in which the condom broke. LMP is unknown because she is on Depo.  Onset is described as ongoing since high school.    Obstetrical/Gynecological History: OB History    Grav Para Term Preterm Abortions TAB SAB Ect Mult Living   3 2 2  1  1   2       Past Medical History: Past Medical History  Diagnosis Date  . ADHD (attention deficit hyperactivity disorder)   . Anxiety   . Depression   . Substance abuse   . Anemia     iron def  . Allergy   . Dysmenorrhea   . BV (bacterial vaginosis)     recurrent-uses boric acid    Past Surgical History: Past Surgical History  Procedure Date  . Hernia repair     Family History: No family history on file.  Social History: History  Substance Use Topics  . Smoking status: Current Some Day Smoker  . Smokeless tobacco: Never Used  . Alcohol Use: Yes     40 onces of beer a night    Allergies: No Known Allergies   (Not in a hospital admission)  Review of Systems - Genito-Urinary ROS: no dysuria, trouble voiding, or hematuria positive for - genital discharge Dermatological ROS: negative for pruritus, rash and  in the vaginal region   Physical Exam   Blood pressure 133/83, pulse 82, temperature 97.3 F (36.3 C), temperature source Oral, height 5\' 7"  (1.702 m), weight 158 lb 9.6 oz (71.94 kg).  General: General appearance - alert, well appearing, and in no  distress Focused Gynecological Exam: VULVA: normal appearing vulva with no masses, tenderness or lesions, VAGINA: normal appearing vagina with normal color and discharge, no lesions, CERVIX: normal appearing cervix without discharge or lesions, UTERUS: uterus is normal size, shape, consistency and nontender, ADNEXA: normal adnexa in size, nontender and no masses, WET MOUNT done - results: obtained, exam chaperoned by Addison Naegeli, LPN  Assessment: Recurrent Vaginitis Screening for STI    Plan: Wet prep GC/Chlamydia sent to lab Pro B use discussed  Only use detergents, Deutsches, soaps, and topical agents free of dyes and perfumes   Arial Galligan E. 11/29/2011,2:48 PM

## 2011-11-29 NOTE — Patient Instructions (Signed)
Vaginitis Vaginitis is an infection. It causes soreness, swelling, and redness (inflammation) of the vagina. Many of these infections are sexually transmitted diseases (STDs). Having unprotected sex can cause further problems and complications such as:  Chronic pelvic pain.   Infertility.   Unwanted pregnancy.   Abortion.   Tubal pregnancy.   Infection passed on to the newborn.   Cancer.  CAUSES   Monilia. This is a yeast or fungus infection, not an STD.   Bacterial vaginosis. The normal balance of bacteria in the vagina is disrupted and is replaced by an overgrowth of certain bacteria.   Gonorrhea, chlamydia. These are bacterial infections that are STDs.   Vaginal sponges, diaphragms, and intrauterine devices.   Trichomoniasis. This is a STD infection caused by a parasite.   Viruses like herpes and human papillomavirus. Both are STDs.   Pregnancy.   Immunosuppression. This occurs with certain conditions such as HIV infection or cancer.   Using bubble bath.   Taking certain antibiotic medicines.   Sporadic recurrence can occur if you become sick.   Diabetes.   Steroids.   Allergic reaction. If you have an allergy to:   Douches.   Soaps.   Spermicides.   Condoms.   Scented tampons or vaginal sprays.  SYMPTOMS   Abnormal vaginal discharge.   Itching of the vagina.   Pain in the vagina.   Swelling of the vagina.  In some cases, there are no symptoms. TREATMENT  Treatment will vary depending on the type of infection.  Bacteria or trichomonas are usually treated with oral antibiotics and sometimes vaginal cream or suppositories.   Monilia vaginitis is usually treated with vaginal creams, suppositories, or oral antifungal pills.   Viral vaginitis has no cure. However, the symptoms of herpes (a viral vaginitis) can be treated to relieve the discomfort. Human papillomavirus has no symptoms. However, there are treatments for the diseases caused by human  papillomavirus.   With allergic vaginitis, you need to stop using the product that is causing the problem. Vaginal creams can be used to treat the symptoms.   When treating an STD, the sex partner should also be treated.  HOME CARE INSTRUCTIONS   Take all the medicines as directed by your caregiver.   Do not use scented tampons, soaps, or vaginal sprays.   Do not douche.   Tell your sex partner if you have a vaginal infection or an STD.   Do not have sexual intercourse until you have treated the vaginitis.   Practice safe sex by using condoms.  SEEK MEDICAL CARE IF:   You have abdominal pain.   Your symptoms get worse during treatment.  Document Released: 05/06/2007 Document Revised: 06/28/2011 Document Reviewed: 12/30/2008 [employer] Patient Information 2012 [employer]. 

## 2011-11-30 LAB — GC/CHLAMYDIA PROBE AMP, GENITAL
Chlamydia, DNA Probe: NEGATIVE
GC Probe Amp, Genital: NEGATIVE

## 2011-11-30 LAB — WET PREP, GENITAL
Clue Cells Wet Prep HPF POC: NONE SEEN
Trich, Wet Prep: NONE SEEN
Yeast Wet Prep HPF POC: NONE SEEN

## 2011-12-27 ENCOUNTER — Encounter: Payer: Self-pay | Admitting: Family Medicine

## 2011-12-27 ENCOUNTER — Ambulatory Visit (INDEPENDENT_AMBULATORY_CARE_PROVIDER_SITE_OTHER): Payer: Medicaid Other | Admitting: Family Medicine

## 2011-12-27 VITALS — BP 129/80 | HR 83 | Temp 98.3°F | Ht 67.0 in | Wt 159.0 lb

## 2011-12-27 DIAGNOSIS — N644 Mastodynia: Secondary | ICD-10-CM

## 2011-12-27 DIAGNOSIS — J309 Allergic rhinitis, unspecified: Secondary | ICD-10-CM

## 2011-12-27 DIAGNOSIS — Z309 Encounter for contraceptive management, unspecified: Secondary | ICD-10-CM

## 2011-12-27 DIAGNOSIS — R252 Cramp and spasm: Secondary | ICD-10-CM

## 2011-12-27 LAB — COMPREHENSIVE METABOLIC PANEL
ALT: 18 U/L (ref 0–35)
AST: 18 U/L (ref 0–37)
Albumin: 4.3 g/dL (ref 3.5–5.2)
Alkaline Phosphatase: 66 U/L (ref 39–117)
BUN: 18 mg/dL (ref 6–23)
CO2: 22 mEq/L (ref 19–32)
Calcium: 9.4 mg/dL (ref 8.4–10.5)
Chloride: 108 mEq/L (ref 96–112)
Creat: 0.77 mg/dL (ref 0.50–1.10)
Glucose, Bld: 83 mg/dL (ref 70–99)
Potassium: 4.3 mEq/L (ref 3.5–5.3)
Sodium: 140 mEq/L (ref 135–145)
Total Bilirubin: 0.3 mg/dL (ref 0.3–1.2)
Total Protein: 7.6 g/dL (ref 6.0–8.3)

## 2011-12-27 LAB — CK: Total CK: 70 U/L (ref 7–177)

## 2011-12-27 LAB — CBC WITH DIFFERENTIAL/PLATELET
Basophils Absolute: 0 10*3/uL (ref 0.0–0.1)
Basophils Relative: 0 % (ref 0–1)
Eosinophils Absolute: 0.4 10*3/uL (ref 0.0–0.7)
Eosinophils Relative: 4 % (ref 0–5)
HCT: 37.8 % (ref 36.0–46.0)
Hemoglobin: 12.6 g/dL (ref 12.0–15.0)
Lymphocytes Relative: 33 % (ref 12–46)
Lymphs Abs: 3.4 10*3/uL (ref 0.7–4.0)
MCH: 27.2 pg (ref 26.0–34.0)
MCHC: 33.3 g/dL (ref 30.0–36.0)
MCV: 81.6 fL (ref 78.0–100.0)
Monocytes Absolute: 0.5 10*3/uL (ref 0.1–1.0)
Monocytes Relative: 5 % (ref 3–12)
Neutro Abs: 6 10*3/uL (ref 1.7–7.7)
Neutrophils Relative %: 58 % (ref 43–77)
Platelets: 258 10*3/uL (ref 150–400)
RBC: 4.63 MIL/uL (ref 3.87–5.11)
RDW: 13.3 % (ref 11.5–15.5)
WBC: 10.3 10*3/uL (ref 4.0–10.5)

## 2011-12-27 LAB — PHOSPHORUS: Phosphorus: 3.9 mg/dL (ref 2.3–4.6)

## 2011-12-27 LAB — TSH: TSH: 0.865 u[IU]/mL (ref 0.350–4.500)

## 2011-12-27 LAB — SEDIMENTATION RATE: Sed Rate: 11 mm/hr (ref 0–22)

## 2011-12-27 MED ORDER — FEXOFENADINE HCL 60 MG PO TABS: 60.0000 mg | ORAL_TABLET | Freq: Every day | ORAL | Status: DC

## 2011-12-27 MED ORDER — FEXOFENADINE HCL 180 MG PO TABS: 180.0000 mg | ORAL_TABLET | Freq: Every day | ORAL | Status: DC

## 2011-12-27 MED ORDER — MEDROXYPROGESTERONE ACETATE 150 MG/ML IM SUSP
150.0000 mg | Freq: Once | INTRAMUSCULAR | Status: AC
  Administered 2011-12-27: 150 mg via INTRAMUSCULAR

## 2011-12-27 NOTE — Patient Instructions (Signed)
Today we checked you  for lab abnormalities that could explain the cramping I would like you to follow up with Dr. Fara Boros in a few weeks to go over these results  I will call you if any of them come back abnormal and need further testing before that appointment

## 2011-12-27 NOTE — Assessment & Plan Note (Signed)
Muscle cramps intermittently in different places for about one month. Patient is concerned because her daughter has rheumatoid arthritis and psoriasis and she is worried that she has some rheumatic condition. No evidence on clinical exam of rheumatic arthritis or otherwise. Will check labs today and have patient followup with PCP for any further workup.

## 2011-12-27 NOTE — Assessment & Plan Note (Signed)
Will send for diagnostic mammogram and ultrasound. More likely to be nerve related rather than mass.

## 2011-12-27 NOTE — Progress Notes (Signed)
  Subjective:    Patient ID: Yesenia Weeks, female    DOB: 13-Jul-1976, 36 y.o.   MRN: [id number]  HPI Cramping-patient states that she has intermittent cramping of bilateral hands and feet as well as her quad muscles and the muscles in her lower arms. This is not seen to occur at a particular time of the day. It is not related to overuse. She has not been on any new medications. She does not have any swelling in her joints. She has not had any fevers or rashes.  Positive family history of rheumatic arthritis as well as psoriasis. No personal history of either of these.  Patient also notes breast pain for the last several months. This is intermittent. She does not do breast exams and has not noticed any changes in her skin with a shaver for rest. She is not think that the pain changes with her periods. She says that she has a history of breast cancer in her family but not a first-degree relative. She has not had any nipple discharge.   Review of Systems See above    Objective:   Physical Exam Vital signs reviewed General appearance - alert, well appearing, and in no distress Heart - normal rate, regular rhythm, normal S1, S2, no murmurs, rubs, clicks or gallops Chest - clear to auscultation, no wheezes, rales or rhonchi, symmetric air entry, no tachypnea, retractions or cyanosis MSK-there is no swelling or synovial thickening of the hands or feet. There is no stiffening of motion of the joints. There is no rash present. Full range of motion at each joint. No effusions noted.       Assessment & Plan:

## 2011-12-28 ENCOUNTER — Telehealth: Payer: Self-pay | Admitting: Family Medicine

## 2011-12-28 LAB — VITAMIN D 25 HYDROXY (VIT D DEFICIENCY, FRACTURES): Vit D, 25-Hydroxy: 26 ng/mL — ABNORMAL LOW (ref 30–89)

## 2011-12-28 NOTE — Telephone Encounter (Signed)
Talked to pt about labs.  Low vit D, asked her to supplement 2000IU daily and f/u with Dr. Fara Boros

## 2012-01-14 ENCOUNTER — Ambulatory Visit
Admission: RE | Admit: 2012-01-14 | Discharge: 2012-01-14 | Disposition: A | Discharge status: Home / Self Care | Payer: Medicaid Other | Source: Ambulatory Visit | Attending: Family Medicine | Admitting: Family Medicine

## 2012-01-14 DIAGNOSIS — N644 Mastodynia: Secondary | ICD-10-CM

## 2012-01-18 ENCOUNTER — Telehealth: Payer: Self-pay | Admitting: Family Medicine

## 2012-01-18 DIAGNOSIS — N760 Acute vaginitis: Secondary | ICD-10-CM

## 2012-01-18 MED ORDER — METRONIDAZOLE 0.75 % VA GEL: 1.0000 | Freq: Two times a day (BID) | VAGINAL | Status: AC

## 2012-01-18 NOTE — Telephone Encounter (Signed)
Yesenia Weeks is going out of town today and won't be back until after the 4th.  Is requesting a cream called to pharmacy for her vaginal irritation.  She has had re-occurrence of this before and knows that this is the problem.

## 2012-01-18 NOTE — Telephone Encounter (Signed)
Returned call to patient.  C/o vaginal discharge with odor.  Also has irritation outside vaginal area.  States she usually gets Metrogel for this issue.  Last had Metrogel on 10/2011.  Will check with Dr. Fara Boros for possible refill and call patient back.  Gaylene Brooks, RN

## 2012-05-16 ENCOUNTER — Ambulatory Visit (INDEPENDENT_AMBULATORY_CARE_PROVIDER_SITE_OTHER): Payer: Medicaid Other | Admitting: Family Medicine

## 2012-05-16 ENCOUNTER — Encounter: Payer: Self-pay | Admitting: Family Medicine

## 2012-05-16 ENCOUNTER — Other Ambulatory Visit (HOSPITAL_COMMUNITY)
Admission: RE | Admit: 2012-05-16 | Discharge: 2012-05-16 | Disposition: A | Discharge status: Home / Self Care | Payer: Medicaid Other | Source: Ambulatory Visit | Attending: Family Medicine | Admitting: Family Medicine

## 2012-05-16 VITALS — BP 122/83 | HR 89 | Temp 98.1°F | Ht 67.0 in | Wt 159.9 lb

## 2012-05-16 DIAGNOSIS — N760 Acute vaginitis: Secondary | ICD-10-CM

## 2012-05-16 DIAGNOSIS — Z3042 Encounter for surveillance of injectable contraceptive: Secondary | ICD-10-CM | POA: Insufficient documentation

## 2012-05-16 DIAGNOSIS — Z113 Encounter for screening for infections with a predominantly sexual mode of transmission: Secondary | ICD-10-CM | POA: Insufficient documentation

## 2012-05-16 DIAGNOSIS — Z3049 Encounter for surveillance of other contraceptives: Secondary | ICD-10-CM

## 2012-05-16 LAB — POCT WET PREP (WET MOUNT): Clue Cells Wet Prep Whiff POC: NEGATIVE

## 2012-05-16 LAB — POCT URINE PREGNANCY: Preg Test, Ur: NEGATIVE

## 2012-05-16 MED ORDER — MEDROXYPROGESTERONE ACETATE 150 MG/ML IM SUSP
150.0000 mg | Freq: Once | INTRAMUSCULAR | Status: AC
  Administered 2012-05-16: 150 mg via INTRAMUSCULAR

## 2012-05-16 MED ORDER — CETIRIZINE HCL 10 MG PO TABS: 10.0000 mg | ORAL_TABLET | Freq: Every day | ORAL | Status: DC

## 2012-05-16 NOTE — Assessment & Plan Note (Signed)
U preg negative. Depo given

## 2012-05-16 NOTE — Addendum Note (Signed)
Addended byArlyss Repress on: 05/16/2012 12:13 PM   Modules accepted: Orders

## 2012-05-16 NOTE — Assessment & Plan Note (Addendum)
Wet prep done, will send in metrogel if + for BV. GC/Chlam done.  Addendum: wet prep neg; normal vag d/c. Pt to return if not improving in a few weeks. F/u GC/Chlam results

## 2012-05-16 NOTE — Patient Instructions (Addendum)
It was good to see you today.  The wet prep does not show BV-- it's just normal discharge.  The gonorrhea/chalmydia will take a few days, but I will send you a letter with those results if they are negative.  If they are positive, we will call you.  I sent in some allergy medicine to the pharmacy.   We gave you your depo today. Come back in ~3 months for your next shot.   Come back if your discharge does not improve in the next week or so.

## 2012-05-16 NOTE — Progress Notes (Signed)
S: Pt comes in today for vaginitis.  Pt has been having "the usual" discharge off and on for a few weeks.  Has had multiple occurences of BV.  Having vaginal irritation. Thin, white d/c with a fishy odor which seems stronger when she urinates.  Mild itching.  No pain.  Has not had intercourse since 12/18/11.  Last GC/Chlam was check 5/9 so she would like this done as well.  Is late for her depo- not sure how late.  Would like this today as well.    ROS: Per HPI  History  Smoking status  . Current Some Day Smoker  Smokeless tobacco  . Never Used    O:  Filed Vitals:   05/16/12 0849  BP: 122/83  Pulse: 89  Temp: 98.1 F (36.7 C)    Gen: NAD Abd: soft, NT Pelvic: external genitalia normal, clitoral piercing noted; cervix normal appearing- non-friable or erythematous, mild to moderate amount of thin, white d/c in vaginal vault; no CMT or adnexal tenderness on bimanual exam    A/P: 36 y.o. female p/w vaginal d/c, h/o BV, need for depo -See problem list -f/u in PRN

## 2012-05-19 ENCOUNTER — Encounter: Payer: Self-pay | Admitting: Family Medicine

## 2012-05-30 ENCOUNTER — Encounter: Payer: Medicaid Other | Admitting: Family Medicine

## 2012-07-28 ENCOUNTER — Ambulatory Visit: Payer: Medicaid Other | Admitting: Family Medicine

## 2012-07-31 ENCOUNTER — Ambulatory Visit (INDEPENDENT_AMBULATORY_CARE_PROVIDER_SITE_OTHER): Payer: Medicaid Other | Admitting: Family Medicine

## 2012-07-31 ENCOUNTER — Telehealth: Payer: Self-pay | Admitting: Family Medicine

## 2012-07-31 ENCOUNTER — Encounter: Payer: Self-pay | Admitting: Family Medicine

## 2012-07-31 ENCOUNTER — Other Ambulatory Visit (HOSPITAL_COMMUNITY)
Admission: RE | Admit: 2012-07-31 | Discharge: 2012-07-31 | Disposition: A | Discharge status: Home / Self Care | Payer: Medicaid Other | Source: Ambulatory Visit | Attending: Family Medicine | Admitting: Family Medicine

## 2012-07-31 VITALS — BP 127/79 | HR 82 | Temp 97.5°F | Ht 67.0 in | Wt 164.1 lb

## 2012-07-31 DIAGNOSIS — R300 Dysuria: Secondary | ICD-10-CM

## 2012-07-31 DIAGNOSIS — L989 Disorder of the skin and subcutaneous tissue, unspecified: Secondary | ICD-10-CM | POA: Insufficient documentation

## 2012-07-31 DIAGNOSIS — N760 Acute vaginitis: Secondary | ICD-10-CM

## 2012-07-31 DIAGNOSIS — Z113 Encounter for screening for infections with a predominantly sexual mode of transmission: Secondary | ICD-10-CM | POA: Insufficient documentation

## 2012-07-31 LAB — POCT URINALYSIS DIPSTICK
Bilirubin, UA: NEGATIVE
Blood, UA: NEGATIVE
Glucose, UA: NEGATIVE
Ketones, UA: NEGATIVE
Leukocytes, UA: NEGATIVE
Nitrite, UA: NEGATIVE
Protein, UA: NEGATIVE
Spec Grav, UA: 1.025
Urobilinogen, UA: 0.2
pH, UA: 5.5

## 2012-07-31 LAB — POCT WET PREP (WET MOUNT)
Clue Cells Wet Prep Whiff POC: NEGATIVE
WBC, Wet Prep HPF POC: >20

## 2012-07-31 MED ORDER — FLUCONAZOLE 150 MG PO TABS: 150.0000 mg | ORAL_TABLET | Freq: Once | ORAL | Status: DC

## 2012-07-31 MED ORDER — TRIAMCINOLONE ACETONIDE 0.025 % EX OINT: TOPICAL_OINTMENT | Freq: Two times a day (BID) | CUTANEOUS | Status: DC

## 2012-07-31 NOTE — Assessment & Plan Note (Addendum)
Patient here for dysuria, foul odor, and vaginal discharge.  UA negative.  Will perform wet prep and GC/chlamydia today.  Will notify of results. Red flags reviewed - no evidence of pyelonephritis or PID at this time.  RTC as needed.

## 2012-07-31 NOTE — Progress Notes (Signed)
  Subjective:    Patient ID: Yesenia Weeks, female    DOB: Jun 07, 1976, 37 y.o.   MRN: [id number]  HPI  Patient presents to clinic for possible UTI.  It has been several years since UTI.  Symptoms include pain inside vagina with urination.  Described as a throbbing pain.  Started drinking cranberry juice and water and patient says symptoms have improved.  Patient still endorses foul odor of urine.  Denies any vaginal bleeding or spotting.  Does still have vaginal discharge and says it is brown in color, thick.  Also complains of flank and back pain, but she has a hx of chronic back pains.  She also complains of a rash on RT arm.  She noticed it last night.  Patient concerned it is a spider bite, because she hates spider.  Lesion is red, swollen, and itchy.  Denies any pain.    Denies any fever, chills, NS, nausea or vomiting.  Good appetite.  Review of Systems  Per HPI    Objective:   Physical Exam  Constitutional: She appears well-nourished. No distress.  Abdominal: Soft. She exhibits no distension. There is no tenderness.  Genitourinary: Uterus normal. There is no rash, tenderness, lesion or injury on the right labia. There is no rash, tenderness, lesion or injury on the left labia. Cervix exhibits no motion tenderness, no discharge and no friability. Vaginal discharge found.  Musculoskeletal:       No CVA tenderness  Skin:       RT arm: 2 cm long red, raised lesion on bicep; no puncture wound, non- tender on palpation; no signs of infection      Assessment & Plan:

## 2012-07-31 NOTE — Patient Instructions (Addendum)
It was nice to meet you today, [place]. Urine test today was normal. Please refer to handout below regarding common causes of vaginitis. It typically takes about 1-2 days for results to return. If today's lab results are ABNORMAL, we will call you and send medication to your pharmacy.   If you develop worsening symptoms, fever (temperature > 101.5 degrees), nausea/vomiting, or pelvic pain, please return to clinic.  For rash on arm, pick up Triamcinolone ointment and apply to lesion until it resolves. Please call your doctor if lesion becomes painful, larger, or becomes infected.  What is vaginitis? Vaginitis is an inflammation of the vagina. Vulvovaginitis refers to inflammation of both the vagina and vulva (the external female genitals).   What are the most common types of vaginitis? The six most common types of vaginitis are:  Candida or "yeast" vaginitis  Bacterial vaginosis  Trichomoniasis vaginitis (a sexually transmitted infection)   What are candida or "yeast" infections? Yeast infections of the vagina are what most women think of when they hear the term "vaginitis." Yeast infections are caused by one of the many species of fungus called candida. Candida normally live in small numbers in the vagina, as well as in the mouth and digestive tract of both men and women.  Yeast infections produce a thick, white vaginal discharge with the consistency of cottage cheese. Although the discharge can be somewhat watery, it is odorless. Yeast infections usually cause the vagina and the vulva to be very itchy and red, even before the onset of discharge.  If yeast is normal in a woman's vagina, what makes it cause an infection? Usually, infection occurs when a change in the delicate balance in a woman's system takes place.   What is bacterial vaginosis? Although "yeast" is the name most women know, bacterial vaginosis (BV) actually is the most common vaginal infection in women of reproductive age.  Bacterial vaginosis often will cause a vaginal discharge. The discharge usually is thin and milky, and is described as having a "fishy" odor. This odor may become more noticeable after intercourse.  Redness or itching of the vagina are not common symptoms of bacterial vaginosis. Some women with BV have no symptoms at all, and the vaginitis is only discovered during a routine gynecologic exam. Bacterial vaginosis is caused by a combination of several bacteria. These bacteria seem to overgrow in much the same way as do candida when the vaginal pH balance is upset.  Because bacterial vaginosis is caused by bacteria and not by yeast, medicine that is appropriate for yeast is not effective against the bacteria that cause bacterial vaginosis.  BV is treated in asymptomatic females of child bearing age to decrease their risk of preterm delivery.  Risk factors for BV include:  New or multiple sexual partners  Douching  Cigarette smoking  What is trichomoniasis? Trichomoniasis - Trichomoniasis is caused by a tiny single-celled organism known as a "protozoa." When this organism infects the vagina, it can cause a frothy, greenish-yellow discharge. Often this discharge will have a foul smell. Women with trichomonal vaginitis may complain of itching and soreness of the vagina and vulva, as well as burning during urination. In addition, there can be discomfort in the lower abdomen and vaginal pain with intercourse. These symptoms may be worse after the menstrual period. Many women, however, do not develop any symptoms. It is important to understand that this type of vaginitis can be transmitted through sexual intercourse. For treatment to be effective, the sexual partner must be  treated at the same time as the patient.

## 2012-07-31 NOTE — Telephone Encounter (Signed)
Called patient and let her know that she has a yeast infection and Diflucan sent to pharmacy.

## 2012-07-31 NOTE — Assessment & Plan Note (Signed)
Likely contact dermatitis or flare up of eczema.  No puncture would indicating spider or bug bite.  No signs of infection or cellulitis at this time.  Will treat with Triamcinolone ointment until lesion resolves.  Return to clinic if symptoms worsen.

## 2012-08-06 ENCOUNTER — Telehealth: Payer: Self-pay | Admitting: Family Medicine

## 2012-08-06 MED ORDER — FLUCONAZOLE 150 MG PO TABS: 150.0000 mg | ORAL_TABLET | Freq: Once | ORAL | Status: DC

## 2012-08-06 NOTE — Telephone Encounter (Signed)
Please call and inform patient that she may need a second dose of Diflucan for symptoms to resolve completely.  I sent a second dose of Diflucan to her pharmacy. If symptoms do not improve after therapy, she will need to make a follow up appointment.

## 2012-08-06 NOTE — Telephone Encounter (Signed)
Pt was seen last week and still having brown d/c - wants to know what to do

## 2012-08-06 NOTE — Telephone Encounter (Signed)
Patient states she took Diflucan last Friday but  continues to have brown discharge. Feels she needs something more. Will forward message to Dr. Tye Savoy.

## 2012-08-07 NOTE — Telephone Encounter (Signed)
Informed patient about below. She will call and make an OV if the symptoms do not clear up

## 2012-08-07 NOTE — Telephone Encounter (Signed)
LVM for patient to call back. ?

## 2012-09-03 ENCOUNTER — Ambulatory Visit (INDEPENDENT_AMBULATORY_CARE_PROVIDER_SITE_OTHER): Payer: Medicaid Other | Admitting: Family Medicine

## 2012-09-03 ENCOUNTER — Encounter: Payer: Self-pay | Admitting: Family Medicine

## 2012-09-03 VITALS — BP 132/85 | HR 86 | Ht 67.0 in | Wt 165.0 lb

## 2012-09-03 DIAGNOSIS — Z01419 Encounter for gynecological examination (general) (routine) without abnormal findings: Secondary | ICD-10-CM

## 2012-09-03 DIAGNOSIS — F17200 Nicotine dependence, unspecified, uncomplicated: Secondary | ICD-10-CM

## 2012-09-03 DIAGNOSIS — Z0000 Encounter for general adult medical examination without abnormal findings: Secondary | ICD-10-CM

## 2012-09-03 DIAGNOSIS — F339 Major depressive disorder, recurrent, unspecified: Secondary | ICD-10-CM

## 2012-09-03 DIAGNOSIS — R252 Cramp and spasm: Secondary | ICD-10-CM

## 2012-09-03 LAB — BASIC METABOLIC PANEL
BUN: 10 mg/dL (ref 6–23)
CO2: 27 mEq/L (ref 19–32)
Calcium: 9.3 mg/dL (ref 8.4–10.5)
Chloride: 104 mEq/L (ref 96–112)
Creat: 0.65 mg/dL (ref 0.50–1.10)
Glucose, Bld: 93 mg/dL (ref 70–99)
Potassium: 3.8 mEq/L (ref 3.5–5.3)
Sodium: 139 mEq/L (ref 135–145)

## 2012-09-03 NOTE — Assessment & Plan Note (Signed)
Denies SI/HI.  Given Dr. Carola Rhine information.  Also aware she can return to [facility] if she wants.

## 2012-09-03 NOTE — Patient Instructions (Signed)
It was good to see you today.  We are checking a lab to make sure there is no lab reason for your muscle cramps.  Just make sure you stay well hydrated.  For your headaches, motrin is fine.  If you find you are needing to take it more than a few times per week, consider starting Zantac over the counter to help prevent irritation of your stomach lining.  It is fine to continue depo; other options include Mirena (the IUD that goes in your uterus- good for 5 years) or Nexplanon (the rod in your arm-good for 3 years).  Let me know if you want one of these.  I recommend that you meet with our psychologist, Dr. Spero Geralds, for help dealing with your anxiety and depression.  You can schedule an appointment with her by calling her directly at [phone].  Come back to see me in 1 year- your next pap is due 10/2013.

## 2012-09-03 NOTE — Assessment & Plan Note (Signed)
Likely idiopathic but will check BMET.  Advised pt results will most likely be normal. Encouraged hydration.

## 2012-09-03 NOTE — Assessment & Plan Note (Signed)
Normal pap 10/2010, next pap due 10/2013.

## 2012-09-03 NOTE — Assessment & Plan Note (Signed)
Smoking 1 pack every 2 weeks- only smokes with drinking. Not interested in cessation at this time.

## 2012-09-03 NOTE — Progress Notes (Signed)
Subjective:     Yesenia Weeks is a 37 y.o. female and is here for a comprehensive physical exam. The patient reports problems:  Headache: feels like they are tension headaches, no associate aura, photo/phonophobia; happens 2x/week, takes motrin right when they start to help make them go away faster, no dizziness, blurry vision, has had these before but have increased slightly in frequency with increase in stress (see below)  Numbness in feet/ankle: can be right or left foot/ankle, happens sporadically, lasts for a few minutes then resolves, nothing seems to provoke it or bring it on, better with massage, no color change, not related to activity but + tobacco use  Knee pain: L > R, for years, no swelling, may get sore/achy with any or no activity, no recent change in this  Muscle cramps: hands and feet mostly, last for <10 minutes, happen 4x/mo in her hands and 2x/week in her feet, often has to massage to help with pain/cramp, resolve spontaneously, can sometimes see her fingers or toes "drawing up", has not tried any medicines for this  Depression/stress: currently living in a shelter for the past 4 months with her children, has h/o depression- was being seen at [facility] but didn't continue there because she felt like they just wanted to medicate her and she really wanted counseling as well, was put off by this, but is willing to try again; denies SI/HI, is trying to find affordable and safe housing for her and her children   Tobacco use: smokes 1 pack every 2 weeks- only with EtOH, not interested in cutting down or quitting, no SOB or cough  Birth control: currently using depo, feels like it is working well, does not have monthly periods anymore, may consider RLTC in the future; has been abstinent for the past 8-9 months  History   Social History  . Marital Status: Single    Spouse Name: N/A    Number of Children: N/A  . Years of Education: N/A   Occupational History  . Not on file.    Social History Main Topics  . Smoking status: Current Every Day Smoker  . Smokeless tobacco: Never Used  . Alcohol Use: Yes     Comment: 40 onces of beer a night  . Drug Use: No  . Sexually Active: Yes    Birth Control/ Protection: Injection   Other Topics Concern  . Not on file   Social History Narrative  . No narrative on file   Health Maintenance  Topic Date Due  . Influenza Vaccine  03/23/2012  . Pap Smear  11/14/2013  . Tetanus/tdap  11/21/2015    The following portions of the patient's history were reviewed and updated as appropriate: allergies, current medications, past family history, past medical history, past social history, past surgical history and problem list.  Review of Systems Pertinent items are noted in HPI.   Objective:    BP 132/85  Pulse 86  Ht 5\' 7"  (1.702 m)  Wt 165 lb (74.844 kg)  BMI 25.84 kg/m2 General appearance: alert, cooperative, appears stated age and no distress Neck: no adenopathy, supple, symmetrical, trachea midline and thyroid not enlarged, symmetric, no tenderness/mass/nodules Lungs: clear to auscultation bilaterally Heart: regular rate and rhythm, S1, S2 normal, no murmur, click, rub or gallop Abdomen: soft, non-tender; bowel sounds normal; no masses,  no organomegaly Extremities: extremities normal, atraumatic, no cyanosis or edema Skin: Skin color, texture, turgor normal. No rashes or lesions Neurologic: Grossly normal    Assessment:  Healthy female exam. Multiple somatic complaints      Plan:     Copious amounts of reassurance given Check BMET See After Visit Summary for Counseling Recommendations

## 2012-09-04 ENCOUNTER — Encounter: Payer: Self-pay | Admitting: Family Medicine

## 2012-11-10 ENCOUNTER — Ambulatory Visit: Payer: Medicaid Other

## 2012-11-14 ENCOUNTER — Ambulatory Visit (INDEPENDENT_AMBULATORY_CARE_PROVIDER_SITE_OTHER): Payer: Medicaid Other | Admitting: *Deleted

## 2012-11-14 DIAGNOSIS — Z309 Encounter for contraceptive management, unspecified: Secondary | ICD-10-CM

## 2012-11-14 MED ORDER — MEDROXYPROGESTERONE ACETATE 150 MG/ML IM SUSP
150.0000 mg | Freq: Once | INTRAMUSCULAR | Status: AC
  Administered 2012-11-14: 150 mg via INTRAMUSCULAR

## 2012-11-14 NOTE — Progress Notes (Signed)
Patient here today for Depo Provera injection.  Depo given today.  Next injection due July 11-25, 2014

## 2013-01-20 ENCOUNTER — Other Ambulatory Visit: Payer: Self-pay | Admitting: Family Medicine

## 2013-06-16 ENCOUNTER — Encounter: Payer: Self-pay | Admitting: Family Medicine

## 2013-06-16 ENCOUNTER — Ambulatory Visit (INDEPENDENT_AMBULATORY_CARE_PROVIDER_SITE_OTHER): Payer: Medicaid Other | Admitting: Family Medicine

## 2013-06-16 VITALS — BP 117/82 | HR 81 | Ht 67.0 in | Wt 163.9 lb

## 2013-06-16 DIAGNOSIS — M79609 Pain in unspecified limb: Secondary | ICD-10-CM

## 2013-06-16 DIAGNOSIS — M79671 Pain in right foot: Secondary | ICD-10-CM | POA: Insufficient documentation

## 2013-06-16 LAB — BASIC METABOLIC PANEL
BUN: 11 mg/dL (ref 6–23)
CO2: 25 mEq/L (ref 19–32)
Calcium: 9.5 mg/dL (ref 8.4–10.5)
Chloride: 105 mEq/L (ref 96–112)
Creat: 0.73 mg/dL (ref 0.50–1.10)
Glucose, Bld: 84 mg/dL (ref 70–99)
Potassium: 4 mEq/L (ref 3.5–5.3)
Sodium: 137 mEq/L (ref 135–145)

## 2013-06-16 LAB — FOLATE: Folate: >20 ng/mL

## 2013-06-16 LAB — CBC
HCT: 37.3 % (ref 36.0–46.0)
Hemoglobin: 12.5 g/dL (ref 12.0–15.0)
MCH: 26.7 pg (ref 26.0–34.0)
MCHC: 33.5 g/dL (ref 30.0–36.0)
MCV: 79.5 fL (ref 78.0–100.0)
Platelets: 276 10*3/uL (ref 150–400)
RBC: 4.69 MIL/uL (ref 3.87–5.11)
RDW: 13.4 % (ref 11.5–15.5)
WBC: 10.3 10*3/uL (ref 4.0–10.5)

## 2013-06-16 LAB — VITAMIN B12: Vitamin B-12: 539 pg/mL (ref 211–911)

## 2013-06-16 NOTE — Assessment & Plan Note (Signed)
parasthesias for >1 year - bmet and cbc previously normal, will repeat today - check B12 and folate levels - advise patient to use arch support insoles when on her feet at work.

## 2013-06-16 NOTE — Progress Notes (Signed)
  Subjective:    Patient ID: Yesenia Weeks, female    DOB: 01-Jan-1976, 37 y.o.   MRN: [id number]  HPI Pt presents with bilateral foot pain and numbness for >1 year. She reports that this pain has worsened since she started a job where she is on her feet a lot. It is a tingling pain and also sometimes a duller soreness. The numb feeling may come at the same time or on it's own. The pain is better with massage but otherwise comes on without warning. She has not tried anything else.    Review of Systems  Musculoskeletal: Positive for myalgias. Negative for back pain, gait problem and joint swelling.  Neurological: Positive for numbness. Negative for weakness.  All other systems reviewed and are negative.       Objective:   Physical Exam  Nursing note and vitals reviewed. Constitutional: She is oriented to person, place, and time. She appears well-developed and well-nourished. No distress.  HENT:  Head: Normocephalic and atraumatic.  Right Ear: External ear normal.  Left Ear: External ear normal.  Nose: Nose normal.  Eyes: Conjunctivae are normal. Right eye exhibits no discharge. Left eye exhibits no discharge. No scleral icterus.  Cardiovascular: Normal rate, regular rhythm and normal heart sounds.   No murmur heard. Difficult to palpate DP and PD pulses but no other signs of vascular compromise  Pulmonary/Chest: Effort normal and breath sounds normal. No respiratory distress. She has no wheezes.  Musculoskeletal: Normal range of motion. She exhibits no edema and no tenderness.  Neurological: She is alert and oriented to person, place, and time. She has normal strength and normal reflexes. She displays no atrophy and normal reflexes. No sensory deficit. She exhibits normal muscle tone. Coordination and gait normal.  Skin: Skin is warm, dry and intact. No lesion and no rash noted. She is not diaphoretic. No cyanosis or erythema. No pallor. Nails show no clubbing.  Psychiatric: She has a  normal mood and affect. Her behavior is normal.          Assessment & Plan:

## 2013-06-16 NOTE — Patient Instructions (Signed)
For your foot pain we will check your blood work today. We will call you if anything is abnormal, otherwise you will get a letter. Please also start using a good arch-support insole, especially when on your feet a lot for work.

## 2013-06-22 ENCOUNTER — Telehealth: Payer: Self-pay | Admitting: Family Medicine

## 2013-06-22 NOTE — Telephone Encounter (Signed)
Pt called and would like someone to call her with her recent lab results. jw

## 2013-06-22 NOTE — Telephone Encounter (Signed)
Called pt and informed of labs WNL. Pt reports, that her feet still hurt. She would like to know what the next step would be and she request a referral to a specialist. Fwd. To PCP for review. Lorenda Hatchet, Renato Battles

## 2013-06-22 NOTE — Telephone Encounter (Signed)
Please inquire with pt whether she has used the insole supports Dr. Richarda Blade recommended at her last office visit. If she has not used those, then she should try those first.   Yesenia Dodrill, MD

## 2013-06-22 NOTE — Telephone Encounter (Signed)
Called pt.

## 2013-06-22 NOTE — Telephone Encounter (Signed)
Pt c/o the fact that she does not have money for the insoles and wants a referral to see a foot specialist.  I told patient she would need to see her PCP to discuss her foot problem and to discuss a referral.  I also told patient that a foot specialist may recommend insoles as well. Pt very insistent on referral. Pt will schedule appt with PCP.  Sartaj Hoskin, Darlyne Russian, CMA

## 2013-06-23 ENCOUNTER — Telehealth: Payer: Self-pay | Admitting: *Deleted

## 2013-06-23 NOTE — Telephone Encounter (Signed)
Message copied by Farrell Ours on Tue Jun 23, 2013 10:06 AM ------      Message from: Abram Sander      Created: Mon Jun 22, 2013  2:38 PM       Please inform patient that her labs from last week were all normal and she should try arch supports as we discussed. ------

## 2013-06-23 NOTE — Telephone Encounter (Signed)
Spoke with patietn and informed her of below, she is also coming in on Friday 12/12 @ 9:30am to follow up with her PCP Dr. Pollie Meyer

## 2013-07-03 ENCOUNTER — Ambulatory Visit (INDEPENDENT_AMBULATORY_CARE_PROVIDER_SITE_OTHER): Payer: Medicaid Other | Admitting: Family Medicine

## 2013-07-06 ENCOUNTER — Ambulatory Visit: Payer: Medicaid Other | Admitting: Family Medicine

## 2013-07-22 ENCOUNTER — Ambulatory Visit (INDEPENDENT_AMBULATORY_CARE_PROVIDER_SITE_OTHER): Payer: Medicaid Other | Admitting: Family Medicine

## 2013-07-22 ENCOUNTER — Encounter: Payer: Self-pay | Admitting: Family Medicine

## 2013-07-22 VITALS — BP 120/84 | HR 75 | Ht 67.0 in | Wt 168.5 lb

## 2013-07-22 DIAGNOSIS — M79609 Pain in unspecified limb: Secondary | ICD-10-CM

## 2013-07-22 DIAGNOSIS — M79671 Pain in right foot: Secondary | ICD-10-CM

## 2013-07-22 NOTE — Patient Instructions (Signed)
It was nice to meet you today!  I am referring you to a neurologist for the pain and tingling you've been having in your feet. You should get a call to set up this appointment.  Be well, Dr. Pollie Meyer

## 2013-07-25 NOTE — Assessment & Plan Note (Signed)
Previously had labwork including CBC, CMET, B12, folate all of which were unremarkable. Continues to c/o paresthesia-type sx's. No abnormalities on exam today. After discussion w/ preceptor (Dr. Gwendolyn GrantWalden), will refer pt to neurology for further evaluation.

## 2013-07-25 NOTE — Progress Notes (Signed)
Patient ID: Yesenia Weeks, female   DOB: 1976/04/03, 38 y.o.   MRN: [id number]  HPI:  Foot paresthesias: has been seen here twice before for foot tingling/numbness. Had labwork drawn which was normal. Gets numbness/pain type feeling in both of her feet, they are equally affected. The pain comes and goes several times per day and lasts seconds to minutes. Occasionally has back pain but nothing severe or regular. Denies any bowel or bladder dysfunction. No falls. Also sometimes gets pain in her R lateral knee and has to massage it. Touching that part of her knee does not cause pain in her feet. Has tried taking ibuprofen which did not help the pain. Took some of her cousin's percocets which did help the pain. [facility] to get [facility] inserts but she never did that. She very much would like to be referred to a foot specialist and has the name of someone she would like to be referred to see.  ROS: See HPI  PMFSH: current every day smoker  PHYSICAL EXAM: BP 120/84  Pulse 75  Ht 5\' 7"  (1.702 m)  Wt 168 lb 8 oz (76.431 kg)  BMI 26.38 kg/m2 Gen: NAD HEENT: NCAT Lungs: NWOB Neuro: grossly nonfocal, speech intact Ext: 2+ DP pulses bilaterally. No appreciable lower extremity edema bilaterally. No tenderness to palpation. Feet are atraumatic. Normal strength with plantar and dorsiflexion bilaterally. R knee with full strength and no joint laxity, no effusion, trauma, or erythema.  ASSESSMENT/PLAN:  See problem based charting for additional assessment/plan.   FOLLOW UP: F/u w/ neurology - referring today.  GrenadaBrittany J. Pollie MeyerMcIntyre, MD [facility]

## 2013-07-29 ENCOUNTER — Other Ambulatory Visit (HOSPITAL_COMMUNITY)
Admission: RE | Admit: 2013-07-29 | Discharge: 2013-07-29 | Disposition: A | Discharge status: Home / Self Care | Payer: Medicaid Other | Source: Ambulatory Visit | Attending: Family Medicine | Admitting: Family Medicine

## 2013-07-29 ENCOUNTER — Ambulatory Visit (INDEPENDENT_AMBULATORY_CARE_PROVIDER_SITE_OTHER): Payer: Medicaid Other | Admitting: Family Medicine

## 2013-07-29 ENCOUNTER — Encounter: Payer: Self-pay | Admitting: Family Medicine

## 2013-07-29 VITALS — BP 140/85 | HR 93 | Temp 98.1°F | Ht 67.0 in | Wt 168.0 lb

## 2013-07-29 DIAGNOSIS — Z01419 Encounter for gynecological examination (general) (routine) without abnormal findings: Secondary | ICD-10-CM | POA: Insufficient documentation

## 2013-07-29 DIAGNOSIS — Z20828 Contact with and (suspected) exposure to other viral communicable diseases: Secondary | ICD-10-CM

## 2013-07-29 DIAGNOSIS — Z202 Contact with and (suspected) exposure to infections with a predominantly sexual mode of transmission: Secondary | ICD-10-CM

## 2013-07-29 DIAGNOSIS — Z1151 Encounter for screening for human papillomavirus (HPV): Secondary | ICD-10-CM | POA: Insufficient documentation

## 2013-07-29 DIAGNOSIS — Z124 Encounter for screening for malignant neoplasm of cervix: Secondary | ICD-10-CM

## 2013-07-29 DIAGNOSIS — N760 Acute vaginitis: Secondary | ICD-10-CM

## 2013-07-29 DIAGNOSIS — Z113 Encounter for screening for infections with a predominantly sexual mode of transmission: Secondary | ICD-10-CM | POA: Insufficient documentation

## 2013-07-29 LAB — HIV ANTIBODY (ROUTINE TESTING W REFLEX): HIV: NONREACTIVE

## 2013-07-29 LAB — POCT WET PREP (WET MOUNT): Clue Cells Wet Prep Whiff POC: NEGATIVE

## 2013-07-29 LAB — POCT URINE PREGNANCY: Preg Test, Ur: NEGATIVE

## 2013-07-29 LAB — RPR

## 2013-07-29 NOTE — Progress Notes (Signed)
Patient ID: Yesenia Weeks, female   DOB: 1976/07/08, 38 y.o.   MRN: [id number]  HPI:  Vaginal discharge: has had intermittent vaginal discharge and itching for several months. The itching has decreased over time. She was previously using depo for contraception but stopped in July. She is not using anything now for birth control other than abstinence. Prior to September she had been abstinent for 14 months but then had two episodes of sexual intercourse with a female partner, during which she used a condom each time. She is not concerned about STDs or pregnancy but is willing to be checked today. She has a history of BV. She did have some odor but that has improved slightly. Has had some dark brown/light discharge. The d/c is thin, not clumpy. She has not had a normal period since stopping depo but has had some spotting here and there, the last time she spotted was last month. Has had cramping/pressure in her pelvic area as if she's going to come on her period. This has happened just a couple of times. She also has occasional low back pain on and off, but this has been present for years. She has not had any fevers. She is not interested in any new forms of contraception but plans to remain abstinent.  ROS: See HPI  PMFSH: occ smokes cigarettes every several days  PHYSICAL EXAM: BP 140/85  Pulse 93  Temp(Src) 98.1 F (36.7 C) (Oral)  Ht 5\' 7"  (1.702 m)  Wt 168 lb (76.204 kg)  BMI 26.31 kg/m2 Gen: NAD Heart: RRR Lungs: CTAB Abdomen: soft, nontender to palpation, no masses or organomegaly GU: normal appearing external genitalia without lesions. Vagina is moist with thin whitish discharge. Menstrual blood present in the vaginal vault. Cervix normal in appearance. No cervical motion tenderness or tenderness on bimanual exam. No adnexal masses. Neuro: grossly nonfocal, speech intact  ASSESSMENT/PLAN:  # Vaginal discharge/odor/itching: wet prep normal today in clinic. Likely represents her body's  re-equilibration after stopping depo. Advised eating yogurt with active colonies to help normalize vaginal flora. She is not interested in any other forms of hormonal contraception at this time. Urine pregnancy test negative. Will send gc/chlamydia.   # Health maintenance:  -pap smear collected today since we were doing a pelvic exam -checking gc/chlamydia, HIV, RPR today for STD screening -advised pt f/u for complete physical (won't need pelvic exam at that visit).  # Note: pt also wanted to discuss breathing concerns at this visit, but I advised she would need to schedule a separate visit to discuss this. She has absolutely no respiratory distress today.  FOLLOW UP: F/u in several months for complete physical.  GrenadaBrittany J. Pollie MeyerMcIntyre, MD [facility]

## 2013-07-29 NOTE — Patient Instructions (Addendum)
It was great to see you again today!  The discharge you've been having is likely a result of your body getting used to being off the depo. Try eating yogurt to help your vagina regulate its environment.  I will call you if your test results are not normal.  Otherwise, I will send you a letter.  If you do not hear from me with in 2 weeks please call our office.     Please schedule a separate visit for a complete physical. We won't have to do a pelvic exam at that visit.  Be well, Dr. Pollie MeyerMcIntyre

## 2013-07-31 ENCOUNTER — Encounter: Payer: Self-pay | Admitting: Family Medicine

## 2013-08-03 ENCOUNTER — Ambulatory Visit: Payer: Self-pay | Admitting: Neurology

## 2013-08-05 ENCOUNTER — Encounter: Payer: Self-pay | Admitting: Diagnostic Neuroimaging

## 2013-08-05 ENCOUNTER — Ambulatory Visit (INDEPENDENT_AMBULATORY_CARE_PROVIDER_SITE_OTHER): Payer: Medicaid Other | Admitting: Diagnostic Neuroimaging

## 2013-08-05 VITALS — BP 138/78 | HR 80 | Ht 65.5 in | Wt 168.0 lb

## 2013-08-05 DIAGNOSIS — R200 Anesthesia of skin: Secondary | ICD-10-CM

## 2013-08-05 DIAGNOSIS — R209 Unspecified disturbances of skin sensation: Secondary | ICD-10-CM

## 2013-08-05 NOTE — Patient Instructions (Signed)
I will check MRI brain and EMG.  Follow up with PCP or psychiatry re: anxiety/insomnia treatment.

## 2013-08-05 NOTE — Progress Notes (Signed)
[facility]  PATIENT: Yesenia Weeks DOB: [birth year]  REFERRING CLINICIAN: Pollie MeyerMcIntyre HISTORY FROM: patient  REASON FOR VISIT: new consult   HISTORICAL  CHIEF COMPLAINT:  Chief Complaint  Patient presents with  . bileral foot pain    NP, internal referral, Rm 6    HISTORY OF PRESENT ILLNESS:   38 year old right-handed female here for evaluation of numbness and tingling for past one year. Patient describes numbness, asleep sensation in her feet, legs and hands. She also feels diffuse patchy pain that migrates from different parts of her body. No vision changes, slurred speech, trouble talking. No aggravating factors. The sound in her arms and legs seems to help.  Patient also has long history of depression and anxiety, which she feels is under control currently. She's not any medication for this at this time.  Patient is concerned that something is wrong with her and is frustrated that testing so far has been unremarkable.  REVIEW OF SYSTEMS: Full 14 system review of systems performed and notable only for fatigue joint pain allergy Raynaud's disinterest in activities racing thoughts numbness insomnia sleepiness.  ALLERGIES: No Known Allergies  HOME MEDICATIONS: Outpatient Prescriptions Prior to Visit  Medication Sig Dispense Refill  . cetirizine (ZYRTEC) 10 MG tablet take 1 tablet by mouth once daily  30 tablet  5   No facility-administered medications prior to visit.    PAST MEDICAL HISTORY: Past Medical History  Diagnosis Date  . ADHD (attention deficit hyperactivity disorder)   . Anxiety   . Depression   . Substance abuse   . Anemia     iron def  . Allergy   . Dysmenorrhea   . BV (bacterial vaginosis)     recurrent-uses boric acid    PAST SURGICAL HISTORY: Past Surgical History  Procedure Laterality Date  . Hernia repair      FAMILY HISTORY: No family history on file.  SOCIAL HISTORY:  History   Social History  . Marital Status:  Single    Spouse Name: N/A    Number of Children: 2  . Years of Education: 11   Occupational History  . [employer]    Social History Main Topics  . Smoking status: Current Every Day Smoker  . Smokeless tobacco: Never Used  . Alcohol Use: Yes     Comment: 40 onces of beer a night  . Drug Use: No  . Sexual Activity: Yes    Birth Control/ Protection: Injection   Other Topics Concern  . Not on file   Social History Narrative  . No narrative on file     PHYSICAL EXAM  Filed Vitals:   08/05/13 1020  BP: 138/78  Pulse: 80  Height: 5' 5.5" (1.664 m)  Weight: 168 lb (76.204 kg)    Not recorded    Body mass index is 27.52 kg/(m^2).  GENERAL EXAM: Patient is in no distress; well developed, nourished and groomed; neck is supple  CARDIOVASCULAR: Regular rate and rhythm, no murmurs, no carotid bruits  NEUROLOGIC: MENTAL STATUS: awake, alert, oriented to person, place and time, recent and remote memory intact, normal attention and concentration, language fluent, comprehension intact, naming intact, fund of knowledge appropriate; PRESSURED SPEECH AND SLIGHT ANXIOUS APPEARANCE. CRANIAL NERVE: no papilledema on fundoscopic exam, pupils equal and reactive to light, visual fields full to confrontation, extraocular muscles intact, no nystagmus, facial sensation and strength symmetric, hearing intact, palate elevates symmetrically, uvula midline, shoulder shrug symmetric, tongue midline. MOTOR: normal bulk and tone, full strength  in the BUE, BLE SENSORY: normal and symmetric to light touch, pinprick, temperature, vibration COORDINATION: finger-nose-finger, fine finger movements, heel-shin normal REFLEXES: deep tendon reflexes present and symmetric; DOWN GOING TOES. GAIT/STATION: narrow based gait; able to walk on toes, heels and tandem; romberg is negative    DIAGNOSTIC DATA (LABS, IMAGING, TESTING) - I reviewed patient records, labs, notes, testing and imaging myself where  available.  Lab Results  Component Value Date   WBC 10.3 06/16/2013   HGB 12.5 06/16/2013   HCT 37.3 06/16/2013   MCV 79.5 06/16/2013   PLT 276 06/16/2013      Component Value Date/Time   NA 137 06/16/2013 1117   K 4.0 06/16/2013 1117   CL 105 06/16/2013 1117   CO2 25 06/16/2013 1117   GLUCOSE 84 06/16/2013 1117   BUN 11 06/16/2013 1117   CREATININE 0.73 06/16/2013 1117   CALCIUM 9.5 06/16/2013 1117   PROT 7.6 12/27/2011 1047   ALBUMIN 4.3 12/27/2011 1047   AST 18 12/27/2011 1047   ALT 18 12/27/2011 1047   ALKPHOS 66 12/27/2011 1047   BILITOT 0.3 12/27/2011 1047   Lab Results  Component Value Date   CHOL 173 11/12/2008   HDL 86 11/12/2008   LDLCALC 75 11/12/2008   TRIG 59 11/12/2008   CHOLHDL 2.0 Ratio 11/12/2008   No results found for this basename: HGBA1C   Lab Results  Component Value Date   VITAMINB12 539 06/16/2013   Lab Results  Component Value Date   TSH 0.865 12/27/2011    07/29/13 HIV, RPR - negative  07/29/13 Folate - normal   ASSESSMENT AND PLAN  38 y.o. year old female here with diffuse numbness, tingling, pain sensations in her arms and legs. Neurologic examination is unremarkable.  Ddx: CNS inflamm, vascular, polyradiculopathy, fibromyalgia, neuropathy, heightened anxiety state   PLAN: Orders Placed This Encounter  Procedures  . MR Brain W Wo Contrast  . NCV with EMG(electromyography)   Return for emg.    Suanne Marker, MD 08/05/2013, 11:48 AM Certified in Neurology, Neurophysiology and Neuroimaging  [facility] [address] [place], [place] [address] [phone]

## 2013-08-10 ENCOUNTER — Telehealth: Payer: Self-pay | Admitting: Family Medicine

## 2013-08-10 NOTE — Telephone Encounter (Signed)
[facility] to [facility]

## 2013-08-12 ENCOUNTER — Encounter (INDEPENDENT_AMBULATORY_CARE_PROVIDER_SITE_OTHER): Payer: Self-pay

## 2013-08-12 ENCOUNTER — Ambulatory Visit (INDEPENDENT_AMBULATORY_CARE_PROVIDER_SITE_OTHER): Payer: Medicaid Other | Admitting: Diagnostic Neuroimaging

## 2013-08-12 DIAGNOSIS — R209 Unspecified disturbances of skin sensation: Secondary | ICD-10-CM

## 2013-08-12 DIAGNOSIS — R200 Anesthesia of skin: Secondary | ICD-10-CM

## 2013-08-12 DIAGNOSIS — Z0289 Encounter for other administrative examinations: Secondary | ICD-10-CM

## 2013-08-12 NOTE — Procedures (Signed)
   [facility]  NCS (NERVE CONDUCTION STUDY) WITH EMG (ELECTROMYOGRAPHY) REPORT   STUDY DATE: 08/12/13 PATIENT NAME: Yesenia Weeks DOB: 10/13/1975 MRN: [id number]  ORDERING CLINICIAN: Joycelyn SchmidVikram Aldred Mase, MD  TECHNOLOGIST: Gearldine ShownLorraine Jones ELECTROMYOGRAPHER: Glenford BayleyVikram R. Jezabelle Chisolm, MD  CLINICAL INFORMATION: 38 year old female with diffuse paresthesias.  FINDINGS: NERVE CONDUCTION STUDY: Bilateral median, bilateral ulnar, right peroneal, right tibial motor responses have normal distal latencies, amplitudes, conduction velocities and F-wave latencies. Bilateral median, bilateral ulnar, right superficial peroneal sensory responses are normal. Bilateral median and ulnar transcarpal mixed nerve responses are normal.  NEEDLE ELECTROMYOGRAPHY: Needle examination of selected muscles of the right upper extremity (deltoid, biceps, triceps, flexor carpi radialis, first dorsal interosseous) shows no abnormal spontaneous activity at rest and normal motor unit recruitment on exertion.  IMPRESSION:  This is a normal study. No diagnostic evidence of large fiber neuropathy at this time.   INTERPRETING PHYSICIAN:  Suanne MarkerVIKRAM R. Loistine Eberlin, MD Certified in Neurology, Neurophysiology and Neuroimaging  [facility] [address], [address] [place], [place] [id number] [phone]

## 2013-08-23 ENCOUNTER — Emergency Department (HOSPITAL_COMMUNITY)
Admission: EM | Admit: 2013-08-23 | Discharge: 2013-08-23 | Disposition: A | Discharge status: Home / Self Care | Payer: Medicaid Other | Source: Home / Self Care | Attending: Family Medicine | Admitting: Family Medicine

## 2013-08-23 ENCOUNTER — Encounter (HOSPITAL_COMMUNITY): Payer: Self-pay | Admitting: Emergency Medicine

## 2013-08-23 ENCOUNTER — Other Ambulatory Visit (HOSPITAL_COMMUNITY)
Admission: RE | Admit: 2013-08-23 | Discharge: 2013-08-23 | Disposition: A | Discharge status: Home / Self Care | Payer: Medicaid Other | Source: Ambulatory Visit | Attending: Family Medicine | Admitting: Family Medicine

## 2013-08-23 DIAGNOSIS — N760 Acute vaginitis: Secondary | ICD-10-CM | POA: Insufficient documentation

## 2013-08-23 DIAGNOSIS — Z202 Contact with and (suspected) exposure to infections with a predominantly sexual mode of transmission: Secondary | ICD-10-CM

## 2013-08-23 DIAGNOSIS — N898 Other specified noninflammatory disorders of vagina: Secondary | ICD-10-CM

## 2013-08-23 DIAGNOSIS — Z113 Encounter for screening for infections with a predominantly sexual mode of transmission: Secondary | ICD-10-CM | POA: Insufficient documentation

## 2013-08-23 LAB — POCT URINALYSIS DIP (DEVICE)
Bilirubin Urine: NEGATIVE
Glucose, UA: NEGATIVE mg/dL
Hgb urine dipstick: NEGATIVE
Ketones, ur: NEGATIVE mg/dL
Leukocytes, UA: NEGATIVE
Nitrite: NEGATIVE
Protein, ur: NEGATIVE mg/dL
Specific Gravity, Urine: >=1.03 (ref 1.005–1.030)
Urobilinogen, UA: 0.2 mg/dL (ref 0.0–1.0)
pH: 5.5 (ref 5.0–8.0)

## 2013-08-23 LAB — POCT PREGNANCY, URINE: Preg Test, Ur: NEGATIVE

## 2013-08-23 MED ORDER — CEFTRIAXONE SODIUM 250 MG IJ SOLR
INTRAMUSCULAR | Status: AC
  Filled 2013-08-23: qty 250

## 2013-08-23 MED ORDER — AZITHROMYCIN 250 MG PO TABS
ORAL_TABLET | ORAL | Status: AC
  Filled 2013-08-23: qty 4

## 2013-08-23 MED ORDER — AZITHROMYCIN 250 MG PO TABS
1000.0000 mg | ORAL_TABLET | Freq: Once | ORAL | Status: AC
  Administered 2013-08-23: 1000 mg via ORAL

## 2013-08-23 MED ORDER — CEFTRIAXONE SODIUM 250 MG IJ SOLR
250.0000 mg | Freq: Once | INTRAMUSCULAR | Status: AC
  Administered 2013-08-23: 250 mg via INTRAMUSCULAR

## 2013-08-23 MED ORDER — LIDOCAINE HCL (PF) 1 % IJ SOLN
INTRAMUSCULAR | Status: AC
  Filled 2013-08-23: qty 5

## 2013-08-23 NOTE — Discharge Instructions (Signed)
Remaining results from testing today should be in by Tuesday. We will call you for positive results but you may call us if you wish. We will call in any medications if needed for any further positive results. Please review the information below. [facility] for further screening as discussed.

## 2013-08-23 NOTE — ED Provider Notes (Signed)
CSN: [id number]     Arrival date & time 08/23/13  0902 History   First MD Initiated Contact with Patient 08/23/13 [id number]     Chief Complaint  Patient presents with  . Vaginal Discharge    Patient is a 38 y.o. female presenting with vaginal discharge. The history is provided by the patient.  Vaginal Discharge Quality:  Malodorous and mucoid Severity:  Moderate Onset quality:  Gradual Duration:  1 week Timing:  Constant Progression:  Worsening Chronicity:  New Relieved by:  None tried Ineffective treatments:  None tried Associated symptoms: vaginal itching   Associated symptoms: no abdominal pain, no dyspareunia, no dysuria, no fever, no genital lesions, no urinary frequency, no urinary hesitancy, no urinary incontinence and no vomiting   Risk factors: new sexual partner, STI and unprotected sex   Relatively new sexual partner. Condom recently broke during intercourse. Pt off Depo since July, no period since. Admits to h/o GC and chlamydia in the past as well as recurrent BV.   Past Medical History  Diagnosis Date  . ADHD (attention deficit hyperactivity disorder)   . Anxiety   . Depression   . Substance abuse   . Anemia     iron def  . Allergy   . Dysmenorrhea   . BV (bacterial vaginosis)     recurrent-uses boric acid   Past Surgical History  Procedure Laterality Date  . Hernia repair     History reviewed. No pertinent family history. History  Substance Use Topics  . Smoking status: Current Every Day Smoker  . Smokeless tobacco: Never Used  . Alcohol Use: Yes     Comment: 40 onces of beer a night   OB History   Grav Para Term Preterm Abortions TAB SAB Ect Mult Living   3 2 2  1  1   2      Review of Systems  Constitutional: Negative.  Negative for fever.  HENT: Negative.   Eyes: Negative.   Respiratory: Negative.   Cardiovascular: Negative.   Gastrointestinal: Negative.  Negative for vomiting and abdominal pain.  Endocrine: Negative.   Genitourinary: Positive  for vaginal discharge and vaginal pain. Negative for bladder incontinence, dysuria, hesitancy and dyspareunia.  Allergic/Immunologic: Negative.   Neurological: Negative.   Hematological: Negative.   Psychiatric/Behavioral: Negative.     Allergies  Review of patient's allergies indicates no known allergies.  Home Medications   Current Outpatient Rx  Name  Route  Sig  Dispense  Refill  . cetirizine (ZYRTEC) 10 MG tablet      take 1 tablet by mouth once daily   30 tablet   5   . ibuprofen (ADVIL,MOTRIN) 800 MG tablet   Oral   Take 800 mg by mouth every 8 (eight) hours as needed.          BP 118/77  Pulse 78  Temp(Src) 98.2 F (36.8 C) (Oral)  Resp 18  SpO2 100%  LMP 07/30/2013 Physical Exam  Constitutional: She is oriented to person, place, and time. She appears well-developed and well-nourished.  HENT:  Head: Normocephalic and atraumatic.  Eyes: Conjunctivae are normal.  Neck: Neck supple.  Cardiovascular: Normal rate and regular rhythm.   Pulmonary/Chest: Effort normal and breath sounds normal.  Abdominal: Hernia confirmed negative in the right inguinal area and confirmed negative in the left inguinal area.  Genitourinary: No labial fusion. There is no rash, tenderness, lesion or injury on the right labia. There is no rash, tenderness, lesion or injury on the left  labia. Uterus is not deviated, not enlarged, not fixed and not tender. Cervix exhibits motion tenderness, discharge and friability. Right adnexum displays no mass, no tenderness and no fullness. Left adnexum displays no mass, no tenderness and no fullness. No erythema, tenderness or bleeding around the vagina. No foreign body around the vagina. No signs of injury around the vagina. Vaginal discharge found.  Musculoskeletal: Normal range of motion.  Lymphadenopathy:       Right: No inguinal adenopathy present.       Left: No inguinal adenopathy present.  Neurological: She is alert and oriented to person, place,  and time.  Skin: Skin is warm and dry.  Psychiatric: She has a normal mood and affect.    ED Course  Pelvic exam Date/Time: 08/23/2013 10:11 AM Performed by: Leanne ChangSCHORR, Nastasha Reising P Authorized by: Bradd CanaryKINDL, JAMES D Consent: Verbal consent obtained. Risks and benefits: risks, benefits and alternatives were discussed Consent given by: patient Patient understanding: patient states understanding of the procedure being performed Patient consent: the patient's understanding of the procedure matches consent given Patient identity confirmed: verbally with patient and arm band Local anesthesia used: no Patient sedated: no Patient tolerance: Patient tolerated the procedure well with no immediate complications.   (including critical care time) Labs Review Labs Reviewed  POCT URINALYSIS DIP (DEVICE)  POCT PREGNANCY, URINE   Imaging Review No results found.    MDM  No diagnosis found.  H/o 1 week increased maloderous vag d/c w/ some mild itching and vag irritation. Exam notable for thick, greenish/white discharge, cervical d/c and friability. Given Rocephin 250mg  and Zithromax 1G PO. Will await results of AFFIRM testing for further tx. Pt [facility]D screening such as HIV and Syphilis. STI.safe sex info provided. Pt agreeable w/ plan.    Leanne ChangKatherine P Random Dobrowski, NP 08/23/13 1019

## 2013-08-23 NOTE — ED Provider Notes (Signed)
Medical screening examination/treatment/procedure(s) were performed by resident physician or non-physician practitioner and as supervising physician I was immediately available for consultation/collaboration.   Barkley BrunsKINDL,Kirbie Stodghill DOUGLAS MD.   Linna HoffJames D Ransom Nickson, MD 08/23/13 [phone]

## 2013-08-23 NOTE — ED Notes (Signed)
C/o vaginal discharge for a week now States she does have some itching and irritation Admits to having recurrent bacteria vaginosis

## 2013-08-25 ENCOUNTER — Telehealth (HOSPITAL_COMMUNITY): Payer: Self-pay | Admitting: *Deleted

## 2013-08-25 NOTE — ED Notes (Signed)
Pt. called for her lab results.  Pt. verified x 2 and given results. (GC/Chlamydia and Affirm all neg.).   Pt. instructed to practice safe sex.  She said she does but the condom broke. Vassie MoselleYork, Kahlan Engebretson M 08/25/2013

## 2013-08-31 ENCOUNTER — Inpatient Hospital Stay
Admission: RE | Admit: 2013-08-31 | Discharge: 2013-08-31 | Disposition: A | Discharge status: Home / Self Care | Payer: Medicaid Other | Source: Ambulatory Visit | Attending: Diagnostic Neuroimaging | Admitting: Diagnostic Neuroimaging

## 2013-09-09 ENCOUNTER — Inpatient Hospital Stay: Admission: RE | Admit: 2013-09-09 | Payer: Medicaid Other | Source: Ambulatory Visit

## 2013-09-16 ENCOUNTER — Encounter: Payer: Medicaid Other | Admitting: Family Medicine

## 2013-09-18 ENCOUNTER — Inpatient Hospital Stay: Admission: RE | Admit: 2013-09-18 | Payer: Medicaid Other | Source: Ambulatory Visit

## 2013-09-21 ENCOUNTER — Encounter (HOSPITAL_COMMUNITY): Payer: Self-pay | Admitting: Emergency Medicine

## 2013-09-21 ENCOUNTER — Emergency Department (HOSPITAL_COMMUNITY)
Admission: EM | Admit: 2013-09-21 | Discharge: 2013-09-21 | Disposition: A | Discharge status: Home / Self Care | Payer: Medicaid Other | Source: Home / Self Care | Attending: Family Medicine | Admitting: Family Medicine

## 2013-09-21 DIAGNOSIS — N898 Other specified noninflammatory disorders of vagina: Secondary | ICD-10-CM

## 2013-09-21 MED ORDER — VALACYCLOVIR HCL 1 G PO TABS: 1000.0000 mg | ORAL_TABLET | Freq: Two times a day (BID) | ORAL | Status: DC

## 2013-09-21 NOTE — ED Notes (Signed)
Has blisters on her mouth and blisters on her privates

## 2013-09-21 NOTE — ED Provider Notes (Signed)
CSN: [id number]     Arrival date & time 09/21/13  1740 History   First MD Initiated Contact with Patient 09/21/13 1957     Chief Complaint  Patient presents with  . Blister   (Consider location/radiation/quality/duration/timing/severity/associated sxs/prior Treatment) Patient is a 38 y.o. female presenting with STD exposure. The history is provided by the patient.  Exposure to STD This is a new problem. The current episode started 6 to 12 hours ago (noticed labial blister this am). The problem has not changed since onset.Pertinent negatives include no abdominal pain. Nothing aggravates the symptoms. Nothing relieves the symptoms.    Past Medical History  Diagnosis Date  . ADHD (attention deficit hyperactivity disorder)   . Anxiety   . Depression   . Substance abuse   . Anemia     iron def  . Allergy   . Dysmenorrhea   . BV (bacterial vaginosis)     recurrent-uses boric acid   Past Surgical History  Procedure Laterality Date  . Hernia repair     History reviewed. No pertinent family history. History  Substance Use Topics  . Smoking status: Current Every Day Smoker  . Smokeless tobacco: Never Used  . Alcohol Use: Yes     Comment: 40 onces of beer a night   OB History   Grav Para Term Preterm Abortions TAB SAB Ect Mult Living   3 2 2  1  1   2      Review of Systems  Constitutional: Negative.   Gastrointestinal: Negative.  Negative for abdominal pain.  Genitourinary: Positive for genital sores. Negative for urgency, menstrual problem and pelvic pain.    Allergies  Review of patient's allergies indicates no known allergies.  Home Medications   Current Outpatient Rx  Name  Route  Sig  Dispense  Refill  . cetirizine (ZYRTEC) 10 MG tablet      take 1 tablet by mouth once daily   30 tablet   5   . ibuprofen (ADVIL,MOTRIN) 800 MG tablet   Oral   Take 800 mg by mouth every 8 (eight) hours as needed.         . valACYclovir (VALTREX) 1000 MG tablet   Oral  Take 1 tablet (1,000 mg total) by mouth 2 (two) times daily.   20 tablet   0    BP 151/102  Pulse 82  Temp(Src) 98.8 F (37.1 C) (Oral)  Resp 16  SpO2 100% Physical Exam  Nursing note and vitals reviewed. Constitutional: She is oriented to person, place, and time. She appears well-developed and well-nourished. No distress.  Abdominal: Soft. Bowel sounds are normal.  Genitourinary:    There is no lesion on the right labia. There is lesion on the left labia. There is no rash or tenderness on the left labia. No vaginal discharge found.  Neurological: She is alert and oriented to person, place, and time.  Skin: Skin is warm and dry.    ED Course  Procedures (including critical care time) Labs Review Labs Reviewed  HERPES SIMPLEX VIRUS CULTURE   Imaging Review No results found.   MDM   1. Vaginal lesion        Linna HoffJames D Lakera Viall, MD 09/21/13 2026

## 2013-09-21 NOTE — Discharge Instructions (Signed)
Use medicine as prescribed , we will call if tests show a need for other treatment or for positive results. See your doctor for further problems.

## 2013-09-22 ENCOUNTER — Telehealth: Payer: Self-pay | Admitting: Family Medicine

## 2013-09-22 NOTE — Telephone Encounter (Signed)
LMVM for patient to call back.  See MD note below.  Graciela Plato, Darlyne RussianKristen L, CMA

## 2013-09-22 NOTE — Telephone Encounter (Signed)
[name] is quite concerned about her recent bp levels.  Was told that she may need to be on medication for this.  Want to speak with you about starting on something, but your sched is booked until 3/30.  She already have an appt for her phy on 4/1, but wanted to see you sooner.  Please contact her to discuss this or to get in sooner with you.

## 2013-09-22 NOTE — Telephone Encounter (Signed)
I cannot start a medication without seeing her in the office. If she would like, she can schedule an appointment with another provider who has a sooner opening. Please inform patient.  Latrelle DodrillBrittany J McIntyre, MD

## 2013-09-23 LAB — HERPES SIMPLEX VIRUS CULTURE
Culture: DETECTED
Special Requests: NORMAL

## 2013-09-25 NOTE — ED Notes (Signed)
Pt. called in for her lab results.  Pt. 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  Pt. told she was adequately treated with Valtrex.  Pt. instructed to notify her partner, that she can pass the virus even when she doesn't have an outbreak, so always practice safe sex, and to get treated for each outbreak with Acyclovir or Valtrex. Pt. instructed to get an OB-GYN doctor who can give her a 1 yr Rx. to fill as needed with each outbreak or give you suppressive treatment if you have frequent outbreaks. Pt. said she was tested for everything a month ago and did not have it. I told her she was not tested for Herpes.  I told her it has to be tested with a viral culture of the lesion and she did not have one then to test.  She asked when she got it and I told her there was no way to tell. Vassie MoselleYork, Reegan Mctighe M 09/25/2013

## 2013-09-30 ENCOUNTER — Ambulatory Visit: Payer: Medicaid Other | Admitting: Family Medicine

## 2013-10-09 ENCOUNTER — Other Ambulatory Visit: Payer: Self-pay | Admitting: *Deleted

## 2013-10-12 MED ORDER — CETIRIZINE HCL 10 MG PO TABS: ORAL_TABLET | ORAL | Status: DC

## 2013-10-14 ENCOUNTER — Inpatient Hospital Stay: Admission: RE | Admit: 2013-10-14 | Payer: Medicaid Other | Source: Ambulatory Visit

## 2013-10-21 ENCOUNTER — Ambulatory Visit (INDEPENDENT_AMBULATORY_CARE_PROVIDER_SITE_OTHER): Payer: Medicaid Other | Admitting: Family Medicine

## 2013-10-21 ENCOUNTER — Encounter: Payer: Self-pay | Admitting: Family Medicine

## 2013-10-21 VITALS — BP 113/66 | HR 88 | Temp 99.5°F | Ht 67.0 in | Wt 169.0 lb

## 2013-10-21 DIAGNOSIS — A6000 Herpesviral infection of urogenital system, unspecified: Secondary | ICD-10-CM

## 2013-10-21 DIAGNOSIS — R300 Dysuria: Secondary | ICD-10-CM

## 2013-10-21 DIAGNOSIS — N912 Amenorrhea, unspecified: Secondary | ICD-10-CM

## 2013-10-21 DIAGNOSIS — F339 Major depressive disorder, recurrent, unspecified: Secondary | ICD-10-CM

## 2013-10-21 LAB — POCT URINALYSIS DIPSTICK
Bilirubin, UA: NEGATIVE
Blood, UA: NEGATIVE
Glucose, UA: NEGATIVE
Ketones, UA: NEGATIVE
Leukocytes, UA: NEGATIVE
Nitrite, UA: NEGATIVE
Protein, UA: NEGATIVE
Spec Grav, UA: 1.025
Urobilinogen, UA: 0.2
pH, UA: 6

## 2013-10-21 LAB — POCT URINE PREGNANCY: Preg Test, Ur: NEGATIVE

## 2013-10-21 MED ORDER — MELOXICAM 15 MG PO TABS: 15.0000 mg | ORAL_TABLET | Freq: Every day | ORAL | Status: DC

## 2013-10-21 MED ORDER — LEVONORGESTREL 1.5 MG PO TABS: 1.5000 mg | ORAL_TABLET | Freq: Once | ORAL | Status: DC

## 2013-10-21 MED ORDER — SERTRALINE HCL 50 MG PO TABS: 50.0000 mg | ORAL_TABLET | Freq: Every day | ORAL | Status: DC

## 2013-10-21 NOTE — Patient Instructions (Addendum)
It was great to see you again today!  For herpes:  -see the handout below -let me know if you want to take suppressive therapy  For depression: -we are starting zoloft -follow up in 1 week for your mood to be sure you're tolerating the medicine okay -If you have any thoughts of hurting yourself or anyone else, go to the Emergency Room to stay safe.   For back pain: -I'm sending in an antiinflammatory medicine which you can take daily for the next 7 days, then as needed -do not take advil, aleve, ibuprofen etc with this medicine -heating pad may also be helpful -see handout with exercises/stretches  For pregnancy prevention: -I sent in a prescription for Plan B. -schedule a separate physical appointment to go over health maintenance items and discuss pregnancy prevention in greater detail  Be well, Dr. Pollie MeyerMcIntyre   Genital Herpes Genital herpes is a sexually transmitted disease. This means that it is a disease passed by having sex with an infected person. There is no cure for genital herpes. The time between attacks can be months to years. The virus may live in a person but produce no problems (symptoms). This infection can be passed to a baby as it travels down the birth canal (vagina). In a newborn, this can cause central nervous system damage, eye damage, or even death. The virus that causes genital herpes is usually HSV-2 virus. The virus that causes oral herpes is usually HSV-1. The diagnosis (learning what is wrong) is made through culture results. SYMPTOMS  Usually symptoms of pain and itching begin a few days to a week after contact. It first appears as small blisters that progress to small painful ulcers which then scab over and heal after several days. It affects the outer genitalia, birth canal, cervix, penis, anal area, buttocks, and thighs. HOME CARE INSTRUCTIONS   Keep ulcerated areas dry and clean.  Take medications as directed. Antiviral medications can speed up healing.  They will not prevent recurrences or cure this infection. These medications can also be taken for suppression if there are frequent recurrences.  While the infection is active, it is contagious. Avoid all sexual contact during active infections.  Condoms may help prevent spread of the herpes virus.  Practice safe sex.  Wash your hands thoroughly after touching the genital area.  Avoid touching your eyes after touching your genital area.  Inform your caregiver if you have had genital herpes and become pregnant. It is your responsibility to insure a safe outcome for your baby in this pregnancy.  Only take over-the-counter or prescription medicines for pain, discomfort, or fever as directed by your caregiver. SEEK MEDICAL CARE IF:   You have a recurrence of this infection.  You do not respond to medications and are not improving.  You have new sources of pain or discharge which have changed from the original infection.  You have an oral temperature above 102 F (38.9 C).  You develop abdominal pain.  You develop eye pain or signs of eye infection. Document Released: 07/06/2000 Document Revised: 10/01/2011 Document Reviewed: 07/27/2009 [facility] Patient Information 2014 [place], [place].   Back Exercises Back exercises help treat and prevent back injuries. The goal of back exercises is to increase the strength of your abdominal and back muscles and the flexibility of your back. These exercises should be started when you no longer have back pain. Back exercises include:  Pelvic Tilt. Lie on your back with your knees bent. Tilt your pelvis until the  lower part of your back is against the floor. Hold this position 5 to 10 sec and repeat 5 to 10 times.  Knee to Chest. Pull first 1 knee up against your chest and hold for 20 to 30 seconds, repeat this with the other knee, and then both knees. This may be done with the other leg straight or bent, whichever feels better.  Sit-Ups or  Curl-Ups. Bend your knees 90 degrees. Start with tilting your pelvis, and do a partial, slow sit-up, lifting your trunk only 30 to 45 degrees off the floor. Take at least 2 to 3 seconds for each sit-up. Do not do sit-ups with your knees out straight. If partial sit-ups are difficult, simply do the above but with only tightening your abdominal muscles and holding it as directed.  Hip-Lift. Lie on your back with your knees flexed 90 degrees. Push down with your feet and shoulders as you raise your hips a couple inches off the floor; hold for 10 seconds, repeat 5 to 10 times.  Back arches. Lie on your stomach, propping yourself up on bent elbows. Slowly press on your hands, causing an arch in your low back. Repeat 3 to 5 times. Any initial stiffness and discomfort should lessen with repetition over time.  Shoulder-Lifts. Lie face down with arms beside your body. Keep hips and torso pressed to floor as you slowly lift your head and shoulders off the floor. Do not overdo your exercises, especially in the beginning. Exercises may cause you some mild back discomfort which lasts for a few minutes; however, if the pain is more severe, or lasts for more than 15 minutes, do not continue exercises until you see your caregiver. Improvement with exercise therapy for back problems is slow.  See your caregivers for assistance with developing a proper back exercise program. Document Released: 08/16/2004 Document Revised: 10/01/2011 Document Reviewed: 05/10/2011 [facility] Patient Information 2014 [place], [place].
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
## 2013-10-26 DIAGNOSIS — A6000 Herpesviral infection of urogenital system, unspecified: Secondary | ICD-10-CM | POA: Insufficient documentation

## 2013-10-26 NOTE — Assessment & Plan Note (Signed)
Reports sx's consistent with recurrence of depression. Not suicidal. Will rx zoloft 50mg  daily. Pt to f/u in 1 week to ensure she is tolerating medicine okay without negative side effects.

## 2013-10-26 NOTE — Assessment & Plan Note (Addendum)
Offered reassurance to patient, as she is very distressed by this diagnosis. We discussed options for suppressive therapy if she has future recurrences, or to prevent transmission to her partner. She will think about whether she would like to take long term medication. Provided handout. Pt has had recent negative STD screening for other STD's.

## 2013-10-28 ENCOUNTER — Ambulatory Visit: Payer: Medicaid Other | Admitting: Family Medicine

## 2013-11-08 ENCOUNTER — Encounter (HOSPITAL_COMMUNITY): Payer: Self-pay | Admitting: Emergency Medicine

## 2013-11-08 ENCOUNTER — Emergency Department (HOSPITAL_COMMUNITY)
Admission: EM | Admit: 2013-11-08 | Discharge: 2013-11-08 | Disposition: A | Discharge status: Home / Self Care | Payer: Medicaid Other | Source: Home / Self Care | Attending: Emergency Medicine | Admitting: Emergency Medicine

## 2013-11-08 DIAGNOSIS — K149 Disease of tongue, unspecified: Secondary | ICD-10-CM

## 2013-11-08 MED ORDER — FLUTICASONE PROPIONATE 50 MCG/ACT NA SUSP: 2.0000 | Freq: Every day | NASAL | Status: DC

## 2013-11-08 NOTE — Discharge Instructions (Signed)
Salt Water Gargle  This solution will help make your mouth and throat feel better.  HOME CARE INSTRUCTIONS    Mix 1 teaspoon of salt in 8 ounces of warm water.   Gargle with this solution as much or often as you need or as directed. Swish and gargle gently if you have any sores or wounds in your mouth.   Do not swallow this mixture.  Document Released: 04/12/2004 Document Revised: 10/01/2011 Document Reviewed: 09/03/2008  [employer] Patient Information 2014 [employer].

## 2013-11-08 NOTE — ED Provider Notes (Signed)
CSN: [id number]     Arrival date & time 11/08/13  [id number] History   First MD Initiated Contact with Patient 11/08/13 1028     Chief Complaint  Patient presents with  . tongue irritation    (Consider location/radiation/quality/duration/timing/severity/associated sxs/prior Treatment)  HPI  Is a 38 year old female presenting today with reports of vomiting discomfort on her tongue since eating a lot of sour candy "a couple of days ago". The patient's primary concern is that she was recently diagnosed with vaginal herpes and she is worried that this is a manifestation of oral herpes.  Past Medical History  Diagnosis Date  . ADHD (attention deficit hyperactivity disorder)   . Anxiety   . Depression   . Substance abuse   . Anemia     iron def  . Allergy   . Dysmenorrhea   . BV (bacterial vaginosis)     recurrent-uses boric acid   Past Surgical History  Procedure Laterality Date  . Hernia repair     No family history on file. History  Substance Use Topics  . Smoking status: Current Every Day Smoker  . Smokeless tobacco: Never Used  . Alcohol Use: Yes     Comment: 40 onces of beer a night   OB History   Grav Para Term Preterm Abortions TAB SAB Ect Mult Living   3 2 2  1  1   2      Review of Systems  Constitutional: Negative.  Negative for fever.  HENT: Positive for congestion and sneezing. Negative for nosebleeds, sinus pressure, sore throat and trouble swallowing.   Eyes: Negative.   Respiratory: Negative.  Negative for shortness of breath.   Cardiovascular: Negative.   Gastrointestinal: Negative.  Negative for nausea, vomiting and constipation.  Endocrine: Negative.   Genitourinary: Negative.   Musculoskeletal: Negative.   Skin: Negative for color change, pallor, rash and wound.       Patient reports uncomfortable white bumps on her tongue.  Allergic/Immunologic: Positive for environmental allergies.  Neurological: Negative.   Hematological: Negative.    Psychiatric/Behavioral: Negative.     Allergies  Review of patient's allergies indicates no known allergies.  Home Medications   Prior to Admission medications   Medication Sig Start Date End Date Taking? Authorizing Provider  cetirizine (ZYRTEC) 10 MG tablet take 1 tablet by mouth once daily 10/12/13   Latrelle DodrillBrittany J McIntyre, MD  fluticasone [facility](FLONASE) 50 MCG/ACT nasal spray Place 2 sprays into both nostrils daily. 11/08/13   Weber Cooksatherine Makynzi Eastland, NP  levonorgestrel (PLAN B 1-STEP) 1.5 MG tablet Take 1 tablet (1.5 mg total) by mouth once. 10/21/13   Latrelle DodrillBrittany J McIntyre, MD  meloxicam (MOBIC) 15 MG tablet Take 1 tablet (15 mg total) by mouth daily. 10/21/13   Latrelle DodrillBrittany J McIntyre, MD  sertraline (ZOLOFT) 50 MG tablet Take 1 tablet (50 mg total) by mouth daily. 10/21/13   Latrelle DodrillBrittany J McIntyre, MD  valACYclovir (VALTREX) 1000 MG tablet Take 1 tablet (1,000 mg total) by mouth 2 (two) times daily. 09/21/13   Linna HoffJames D Kindl, MD   BP 136/83  Pulse 93  Temp(Src) 98.1 F (36.7 C) (Oral)  Resp 18  SpO2 98%  Physical Exam  Nursing note and vitals reviewed. Constitutional: She appears well-developed and well-nourished. No distress.  HENT:  Head: Normocephalic and atraumatic.  Right Ear: External ear normal.  Left Ear: External ear normal.  Mouth/Throat: Oropharynx is clear and moist.  Bilateral nares patent; mild swelling and bogginess noted in nasal turbinates.  Posterior oropharynx  pink and moist no obvious exited or tonsillar enlargement. 2-3 small white inflamed papilla taste buds noted on anterior surface  Eyes: Conjunctivae are normal. Pupils are equal, round, and reactive to light. Right eye exhibits no discharge. Left eye exhibits no discharge. No scleral icterus.  Neck: Normal range of motion. Neck supple. No tracheal deviation present.  Negative nuchal rigidity.  Cardiovascular: Normal rate, regular rhythm, normal heart sounds and intact distal pulses.  Exam reveals no gallop and no friction rub.    No murmur heard. Pulmonary/Chest: Effort normal and breath sounds normal. No respiratory distress. She has no wheezes. She has no rales. She exhibits no tenderness.  Lymphadenopathy:    She has no cervical adenopathy.  Skin: She is not diaphoretic.    ED Course  Procedures (including critical care time) Labs Review Labs Reviewed - No data to display  Results for orders placed in visit on 10/21/13  POCT URINALYSIS DIPSTICK      Result Value Ref Range   Color, UA YELLOW     Clarity, UA CLEAR     Glucose, UA NEGATIVE     Bilirubin, UA NEGATIVE     Ketones, UA NEGATIVE     Spec Grav, UA 1.025     Blood, UA NEGATIVE     pH, UA 6.0     Protein, UA NEGATIVE     Urobilinogen, UA 0.2     Nitrite, UA NEGATIVE     Leukocytes, UA Negative    POCT URINE PREGNANCY      Result Value Ref Range   Preg Test, Ur Negative     Imaging Review No results found.   MDM   1. Tongue irritation    Meds ordered this encounter  Medications  . fluticasone (FLONASE) 50 MCG/ACT nasal spray    Sig: Place 2 sprays into both nostrils daily.    Dispense:  16 g    Refill:  2    Saltwater gargles for irritation of tongue; Flonase for alleviation of nasal allergy symptoms. The patient verbalizes understanding and agrees to plan of care.       Weber Cooksatherine Elanda Garmany, NP 11/08/13 1242

## 2013-11-08 NOTE — ED Notes (Signed)
Patient states has bumps on her tongue Noticed it a couple of days ago while brushing her teeth States it feels irritated

## 2013-11-09 NOTE — ED Provider Notes (Signed)
Medical screening examination/treatment/procedure(s) were performed by non-physician practitioner and as supervising physician I was immediately available for consultation/collaboration.  Dajahnae Vondra, M.D.  Ania Levay C Dillie Burandt, MD 11/09/13 1036 

## 2013-11-23 ENCOUNTER — Telehealth: Payer: Self-pay | Admitting: Family Medicine

## 2013-11-23 MED ORDER — VALACYCLOVIR HCL 500 MG PO TABS: 500.0000 mg | ORAL_TABLET | Freq: Every day | ORAL | Status: DC

## 2013-11-23 NOTE — Telephone Encounter (Signed)
Wants to dr Pollie Meyermcintyre about the personal issue they discussed on Juliona 1

## 2013-11-23 NOTE — Telephone Encounter (Signed)
Returned pt's call. We discussed two concerns:  1. The zoloft and mobic together made her feel out of it, funny, tired. She stopped taking them. Denies thoughts of harming herself or others. Can't get in to see me until June. Does say her mood has become more difficult, getting angry with her kids. Advised that she can try just taking zoloft at night, and not mobic (which was rx'd for back pain) to see if she can tolerate just the zoloft. Also offered her an appointment to see another provider here if she is wanting to make any other changes.  2. Herpes. She would like to be on suppressive therapy to prevent transmission to her partner, as well as recurrences. She has not had any subsequent genital outbreaks after her first outbreak but did have a fever blister on her mouth for which she used topical therapy. Advised I would send in once daily valtrex for her to take and that she should call us and let us know if she has recurrences of her herpes while on this medicine.  Latrelle DodrillBrittany J McIntyre, MD

## 2013-11-30 ENCOUNTER — Encounter: Payer: Self-pay | Admitting: Family Medicine

## 2013-12-23 ENCOUNTER — Ambulatory Visit: Payer: Medicaid Other | Admitting: Family Medicine

## 2014-01-20 ENCOUNTER — Ambulatory Visit: Payer: Medicaid Other | Admitting: Family Medicine

## 2014-01-26 ENCOUNTER — Ambulatory Visit (INDEPENDENT_AMBULATORY_CARE_PROVIDER_SITE_OTHER): Payer: Medicaid Other | Admitting: Family Medicine

## 2014-01-26 VITALS — BP 122/83 | HR 89 | Temp 98.4°F | Ht 67.0 in | Wt 165.0 lb

## 2014-01-26 DIAGNOSIS — N760 Acute vaginitis: Secondary | ICD-10-CM

## 2014-01-26 DIAGNOSIS — F529 Unspecified sexual dysfunction not due to a substance or known physiological condition: Secondary | ICD-10-CM

## 2014-01-26 DIAGNOSIS — N898 Other specified noninflammatory disorders of vagina: Secondary | ICD-10-CM

## 2014-01-26 DIAGNOSIS — R37 Sexual dysfunction, unspecified: Secondary | ICD-10-CM

## 2014-01-26 DIAGNOSIS — F339 Major depressive disorder, recurrent, unspecified: Secondary | ICD-10-CM

## 2014-01-26 LAB — POCT WET PREP (WET MOUNT): Clue Cells Wet Prep Whiff POC: NEGATIVE

## 2014-01-26 MED ORDER — BUPROPION HCL ER (XL) 150 MG PO TB24: 150.0000 mg | ORAL_TABLET | Freq: Every day | ORAL | Status: DC

## 2014-01-26 MED ORDER — FLUCONAZOLE 150 MG PO TABS: 150.0000 mg | ORAL_TABLET | Freq: Once | ORAL | Status: DC

## 2014-01-26 NOTE — Patient Instructions (Addendum)
It was great to see you again today!  I sent in medicine for a yeast infection.  I also sent in wellbutrin. Take it once per day. Follow up in 1-2 weeks so we can see how you are tolerating the medicine.  Be well, Dr. Pollie MeyerMcIntyre    Breast Self-Awareness Practicing breast self-awareness may pick up problems early, prevent significant medical complications, and possibly save your life. By practicing breast self-awareness, you can become familiar with how your breasts look and feel and if your breasts are changing. This allows you to notice changes early. It can also offer you some reassurance that your breast health is good. One way to learn what is normal for your breasts and whether your breasts are changing is to do a breast self-exam. If you find a lump or something that was not present in the past, it is best to contact your caregiver right away. Other findings that should be evaluated by your caregiver include nipple discharge, especially if it is bloody; skin changes or reddening; areas where the skin seems to be pulled in (retracted); or new lumps and bumps. Breast pain is seldom associated with cancer (malignancy), but should also be evaluated by a caregiver. HOW TO PERFORM A BREAST SELF-EXAM The best time to examine your breasts is 5-7 days after your menstrual period is over. During menstruation, the breasts are lumpier, and it may be more difficult to pick up changes. If you do not menstruate, have reached menopause, or had your uterus removed (hysterectomy), you should examine your breasts at regular intervals, such as monthly. If you are breastfeeding, examine your breasts after a feeding or after using a breast pump. Breast implants do not decrease the risk for lumps or tumors, so continue to perform breast self-exams as recommended. Talk to your caregiver about how to determine the difference between the implant and breast tissue. Also, talk about the amount of pressure you should use  during the exam. Over time, you will become more familiar with the variations of your breasts and more comfortable with the exam. A breast self-exam requires you to remove all your clothes above the waist. 1. Look at your breasts and nipples. Stand in front of a mirror in a room with good lighting. With your hands on your hips, push your hands firmly downward. Look for a difference in shape, contour, and size from one breast to the other (asymmetry). Asymmetry includes puckers, dips, or bumps. Also, look for skin changes, such as reddened or scaly areas on the breasts. Look for nipple changes, such as discharge, dimpling, repositioning, or redness. 2. Carefully feel your breasts. This is best done either in the shower or tub while using soapy water or when flat on your back. Place the arm (on the side of the breast you are examining) above your head. Use the pads (not the fingertips) of your three middle fingers on your opposite hand to feel your breasts. Start in the underarm area and use  inch (2 cm) overlapping circles to feel your breast. Use 3 different levels of pressure (light, medium, and firm pressure) at each circle before moving to the next circle. The light pressure is needed to feel the tissue closest to the skin. The medium pressure will help to feel breast tissue a little deeper, while the firm pressure is needed to feel the tissue close to the ribs. Continue the overlapping circles, moving downward over the breast until you feel your ribs below your breast. Then, move one  finger-width towards the center of the body. Continue to use the  inch (2 cm) overlapping circles to feel your breast as you move slowly up toward the collar bone (clavicle) near the base of the neck. Continue the up and down exam using all 3 pressures until you reach the middle of the chest. Do this with each breast, carefully feeling for lumps or changes. 3.  Keep a written record with breast changes or normal findings for  each breast. By writing this information down, you do not need to depend only on memory for size, tenderness, or location. Write down where you are in your menstrual cycle, if you are still menstruating. Breast tissue can have some lumps or thick tissue. However, see your caregiver if you find anything that concerns you.  SEEK MEDICAL CARE IF:  You see a change in shape, contour, or size of your breasts or nipples.   You see skin changes, such as reddened or scaly areas on the breasts or nipples.   You have an unusual discharge from your nipples.   You feel a new lump or unusually thick areas.  Document Released: 07/09/2005 Document Revised: 06/25/2012 Document Reviewed: 10/24/2011 [facility] Patient Information 2015 [place], [place]. This information is not intended to replace advice given to you by your health care provider. Make sure you discuss any questions you have with your health care provider.
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
## 2014-01-26 NOTE — Assessment & Plan Note (Signed)
Exam consistent with yeast. Will rx diflucan 150mg  x1, repeat in 3 days if not better. Will also check wet prep to confirm.
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
## 2014-01-26 NOTE — Assessment & Plan Note (Addendum)
Did not tolerate zoloft. No current active SI/HI. Given her coexisting sexual concerns, will start wellbutrin XL 150mg  daily. Will have pt f/u in 1-2 weeks at which time we will likely increase to 300mg  daily if she is tolerating the medication. Noted hx of seizures as a child, these sound like febrile seizures as they occurred at less than 89 years of age. Wellbutrin should be safe in the setting of this hx. Precepted with Dr. Deirdre Priesthambliss who agrees with this plan..Yesenia Weeks

## 2014-02-03 ENCOUNTER — Telehealth: Payer: Self-pay | Admitting: Family Medicine

## 2014-02-03 NOTE — Telephone Encounter (Signed)
Pt stated she was given Diflucan for yeast infection on 01/26/2014. She was given 2 pills and told to take the second one within three days if the first didn't help.  Pt just took the second pill; with no relief from the first pill.  Pt advised to wait a couple of to see if the second pill will give her some relief. If not; schedule an appt per verbal order by Dr. Pollie MeyerMcIntyre.  Clovis PuMartin, Farren Nelles L, RN

## 2014-02-03 NOTE — Telephone Encounter (Signed)
Have question about vag d/c.and med taking for it.

## 2014-02-10 ENCOUNTER — Ambulatory Visit (INDEPENDENT_AMBULATORY_CARE_PROVIDER_SITE_OTHER): Payer: Medicaid Other | Admitting: Family Medicine

## 2014-02-10 VITALS — BP 141/94 | HR 87 | Temp 98.3°F | Ht 67.0 in | Wt 164.6 lb

## 2014-02-10 DIAGNOSIS — N764 Abscess of vulva: Secondary | ICD-10-CM

## 2014-02-10 DIAGNOSIS — N760 Acute vaginitis: Secondary | ICD-10-CM

## 2014-02-10 DIAGNOSIS — F339 Major depressive disorder, recurrent, unspecified: Secondary | ICD-10-CM

## 2014-02-10 MED ORDER — BUPROPION HCL ER (XL) 300 MG PO TB24: 300.0000 mg | ORAL_TABLET | Freq: Every day | ORAL | Status: DC

## 2014-02-10 MED ORDER — FLUCONAZOLE 150 MG PO TABS: 150.0000 mg | ORAL_TABLET | Freq: Once | ORAL | Status: DC

## 2014-02-10 MED ORDER — CLINDAMYCIN HCL 300 MG PO CAPS
300.0000 mg | ORAL_CAPSULE | Freq: Three times a day (TID) | ORAL | Status: DC
Start: 2014-02-10 — End: 2014-03-31

## 2014-02-10 MED ORDER — METRONIDAZOLE 0.75 % VA GEL: 1.0000 | Freq: Two times a day (BID) | VAGINAL | Status: DC

## 2014-02-10 NOTE — Patient Instructions (Addendum)
For abscess: -we opened this up today -do sitz baths/epsom salt baths -take clindamycin as prescribed -f/u in 1 week to recheck  For depression: -increase wellbutrin to 300mg  daily. I sent in a new prescription  For yeast/BV: -I sent in diflucan and metrogel  For headaches/tingling:  -f/u with neurology about scheduling your MRI   Incision and Drainage Care After Refer to this sheet in the next few weeks. These instructions provide you with information on caring for yourself after your procedure. Your caregiver may also give you more specific instructions. Your treatment has been planned according to current medical practices, but problems sometimes occur. Call your caregiver if you have any problems or questions after your procedure. HOME CARE INSTRUCTIONS   If antibiotic medicine is given, take it as directed. Finish it even if you start to feel better.  Only take over-the-counter or prescription medicines for pain, discomfort, or fever as directed by your caregiver.  Keep all follow-up appointments as directed by your caregiver.  Change any bandages (dressings) as directed by your caregiver. Replace old dressings with clean dressings.  Wash your hands before and after caring for your wound. You will receive specific instructions for cleansing and caring for your wound.  SEEK MEDICAL CARE IF:   You have increased pain, swelling, or redness around the wound.  You have increased drainage, smell, or bleeding from the wound.  You have muscle aches, chills, or you feel generally sick.  You have a fever. MAKE SURE YOU:   Understand these instructions.  Will watch your condition.  Will get help right away if you are not doing well or get worse. Document Released: 10/01/2011 Document Reviewed: 10/01/2011 [facility] Patient Information 2015 [place], [place]. This information is not intended to replace advice given to you by your health care provider. Make sure you discuss any  questions you have with your health care provider.   Hidradenitis Suppurativa, Sweat Gland Abscess Hidradenitis suppurativa is a long lasting (chronic), uncommon disease of the sweat glands. With this, boil-like lumps and scarring develop in the groin, some times under the arms (axillae), and under the breasts. It may also uncommonly occur behind the ears, in the crease of the buttocks, and around the genitals.  CAUSES  The cause is from a blocking of the sweat glands. They then become infected. It may cause drainage and odor. It is not contagious. So it cannot be given to someone else. It most often shows up in puberty (about 24 to 37 years of age). But it may happen much later. It is similar to acne which is a disease of the sweat glands. This condition is slightly more common in African-Americans and women. SYMPTOMS   Hidradenitis usually starts as one or more red, tender, swellings in the groin or under the arms (axilla).  Over a period of hours to days the lesions get larger. They often open to the skin surface, draining clear to yellow-colored fluid.  The infected area heals with scarring. DIAGNOSIS  Your caregiver makes this diagnosis by looking at you. Sometimes cultures (growing germs on plates in the lab) may be taken. This is to see what germ (bacterium) is causing the infection.  TREATMENT   Topical germ killing medicine applied to the skin (antibiotics) are the treatment of choice. Antibiotics taken by mouth (systemic) are sometimes needed when the condition is getting worse or is severe.  Avoid tight-fitting clothing which traps moisture in.  Dirt does not cause hidradenitis and it is not caused  by poor hygiene.  Involved areas should be cleaned daily using an antibacterial soap. Some patients find that the liquid form of Lever 2000, applied to the involved areas as a lotion after bathing, can help reduce the odor related to this condition.  Sometimes surgery is needed to  drain infected areas or remove scarred tissue. Removal of large amounts of tissue is used only in severe cases.  Birth control pills may be helpful.  Oral retinoids (vitamin A derivatives) for 6 to 12 months which are effective for acne may also help this condition.  Weight loss will improve but not cure hidradenitis. It is made worse by being overweight. But the condition is not caused by being overweight.  This condition is more common in people who have had acne.  It may become worse under stress. There is no medical cure for hidradenitis. It can be controlled, but not cured. The condition usually continues for years with periods of getting worse and getting better (remission). Document Released: 02/21/2004 Document Revised: 10/01/2011 Document Reviewed: 03/08/2008 [facility] Patient Information 2015 [place]. This information is not intended to replace advice given to you by your health care provider. Make sure you discuss any questions you have with your health care provider.

## 2014-02-10 NOTE — Assessment & Plan Note (Signed)
Since symptoms persist, will retreat empirically for yeast, and also give rx for BV as wet prep had shown clue cells. Pt c/o headache with flagyl so will rx metrogel. F/u prn if not improving.

## 2014-02-10 NOTE — Progress Notes (Signed)
Patient ID: Yesenia Weeks, female   DOB: 06-Feb-1976, 38 y.o.   MRN: [id number]  HPI:  Depression: tolerating wellbutrin.  Still gets upset easily but thinks she's a little better. No SI/HI. Willing to increase dose today. Would like therapist handout. Has had some headaches a few times a week, ibuprofen helps. Was supposed to get MRI with neurology but never got MRI.  Vaginal discharge: still has some itching and discharge from last time. Got a little better after treatment with diflucan but not all the way.  Abscess: has noticed abscess on R side of mons pubis. Thinks it is from shaving. Has been there for several weeks, getting bigger. Has not drained.  ROS: See HPI. Endorses headaches, also chronic tingling in feet  PMFSH: hx iron def anemia, depression, recurrent BV, genital herpes  PHYSICAL EXAM: BP 141/94  Pulse 87  Temp(Src) 98.3 F (36.8 C) (Oral)  Ht 5\' 7"  (1.702 m)  Wt 164 lb 9.6 oz (74.662 kg)  BMI 25.77 kg/m2  LMP 02/09/2014 Gen: NAD HEENT: NCAT Heart: RRR Lungs: CTAB, NWOB GU: firm 2cm tender nodule on R mons pubis with small pustule on top, no erythema or drainage, no warmth Neuro: grossly nonfocal, speech normal Psych: normal range of affect, well groomed, speech normal in rate and volume, normal eye contact   PROCEDURE NOTE: The procedure, risks and complications were discussed with the patient, and the patient signed consent to the procedure. The R mons pubis nodule was anesthetized with 1% lidocaine without epinephrine. The skin was sterilely prepped and in the usual fashion with betadine. After adequate local anesthesia, I&D with a scalpel, appx 0.5cm in length was performed on the R mons. Purulent drainage expressed. The area was then covered gently with gauze. Patient was given post procedure instructions.  ASSESSMENT/PLAN:  # Abscess - drained today in clinic. Will also cover with clindamycin to eliminate bacterial infxn. Pt endorses hx of multiple abscesses  like this previously, so possible dx of hidradenitis. Will give handout on this. F/u in 1 week for recheck of abscess. Counseled to do sitz baths, and not to shave her pubic hair or this is likely to recur.  # Headache - did not address this today as it is a stable chronic problem other than to advise pt to f/u with neurology since they had ordered an MRI of her brain but she has not yet gotten it done. Continue ibuprofen prn pain.  See problem based charting for additional assessment/plan.  FOLLOW UP: F/u in one week for recheck abscess F/u in a few weeks for depression.  GrenadaBrittany J. Pollie MeyerMcIntyre, MD [facility]

## 2014-02-10 NOTE — Assessment & Plan Note (Signed)
Mildly improved on wellbutrin XR 150mg  daily. Will increase to 300 mg daily. F/u in a few weeks to assess for tolerability and efficacy.

## 2014-02-12 ENCOUNTER — Telehealth: Payer: Self-pay | Admitting: Family Medicine

## 2014-02-12 MED ORDER — METRONIDAZOLE 500 MG PO TABS: 500.0000 mg | ORAL_TABLET | Freq: Two times a day (BID) | ORAL | Status: DC

## 2014-02-12 NOTE — Telephone Encounter (Signed)
Rx sent in. Please inform patient. Thanks! Yesenia Weeks Hence, MD  

## 2014-02-12 NOTE — Telephone Encounter (Signed)
Pt called and would prefer the Flagyl cream called in. She feels that this would be better. jw

## 2014-02-12 NOTE — Telephone Encounter (Signed)
Called patient to clarify request. She would like the flagyl pills called in instead of the metrogel, will forward to PCP.

## 2014-02-15 ENCOUNTER — Telehealth: Payer: Self-pay | Admitting: Family Medicine

## 2014-02-15 NOTE — Telephone Encounter (Signed)
Please contact patient regarding recent rx that daughter accidentally threw away.

## 2014-02-16 NOTE — Telephone Encounter (Signed)
Called and discussed with pt - she took several days of antibiotics before her flagyl and clindamycin were thrown away. She is feeling well. The abscess on her vulva is doing better. She has an appt to see me tomorrow. Will hold off on represcribing medication at this time, and reassess at her visit tomorrow. Latrelle DodrillBrittany J Brier Firebaugh, MD

## 2014-02-17 ENCOUNTER — Encounter: Payer: Medicaid Other | Admitting: Family Medicine

## 2014-03-03 ENCOUNTER — Ambulatory Visit (INDEPENDENT_AMBULATORY_CARE_PROVIDER_SITE_OTHER): Payer: Medicaid Other | Admitting: Family Medicine

## 2014-03-03 ENCOUNTER — Encounter: Payer: Self-pay | Admitting: Family Medicine

## 2014-03-03 VITALS — BP 142/83 | HR 78 | Temp 98.2°F | Wt 162.0 lb

## 2014-03-03 DIAGNOSIS — IMO0001 Reserved for inherently not codable concepts without codable children: Secondary | ICD-10-CM | POA: Insufficient documentation

## 2014-03-03 DIAGNOSIS — Z30019 Encounter for initial prescription of contraceptives, unspecified: Secondary | ICD-10-CM

## 2014-03-03 DIAGNOSIS — Z3009 Encounter for other general counseling and advice on contraception: Secondary | ICD-10-CM

## 2014-03-03 LAB — POCT URINE PREGNANCY: Preg Test, Ur: NEGATIVE

## 2014-03-03 MED ORDER — MEDROXYPROGESTERONE ACETATE 150 MG/ML IM SUSP
150.0000 mg | Freq: Once | INTRAMUSCULAR | Status: AC
  Administered 2014-03-03: 150 mg via INTRAMUSCULAR

## 2014-03-03 NOTE — Addendum Note (Signed)
Addended by: Henri MedalHARTSELL, JAZMIN M on: 03/03/2014 10:55 AM   Modules accepted: Orders

## 2014-03-03 NOTE — Patient Instructions (Signed)
Contraception Choices Contraception (birth control) is the use of any methods or devices to prevent pregnancy. Below are some methods to help avoid pregnancy. HORMONAL METHODS   Contraceptive implant. This is a thin, plastic tube containing progesterone hormone. It does not contain estrogen hormone. Your health care provider inserts the tube in the inner part of the upper arm. The tube can remain in place for up to 3 years. After 3 years, the implant must be removed. The implant prevents the ovaries from releasing an egg (ovulation), thickens the cervical mucus to prevent sperm from entering the uterus, and thins the lining of the inside of the uterus.  Progesterone-only injections. These injections are given every 3 months by your health care provider to prevent pregnancy. This synthetic progesterone hormone stops the ovaries from releasing eggs. It also thickens cervical mucus and changes the uterine lining. This makes it harder for sperm to survive in the uterus.  Birth control pills. These pills contain estrogen and progesterone hormone. They work by preventing the ovaries from releasing eggs (ovulation). They also cause the cervical mucus to thicken, preventing the sperm from entering the uterus. Birth control pills are prescribed by a health care provider.Birth control pills can also be used to treat heavy periods.  Minipill. This type of birth control pill contains only the progesterone hormone. They are taken every day of each month and must be prescribed by your health care provider.  Birth control patch. The patch contains hormones similar to those in birth control pills. It must be changed once a week and is prescribed by a health care provider.  Vaginal ring. The ring contains hormones similar to those in birth control pills. It is left in the vagina for 3 weeks, removed for 1 week, and then a new one is put back in place. The patient must be comfortable inserting and removing the ring  from the vagina.A health care provider's prescription is necessary.  Emergency contraception. Emergency contraceptives prevent pregnancy after unprotected sexual intercourse. This pill can be taken right after sex or up to 5 days after unprotected sex. It is most effective the sooner you take the pills after having sexual intercourse. Most emergency contraceptive pills are available without a prescription. Check with your pharmacist. Do not use emergency contraception as your only form of birth control. BARRIER METHODS   Female condom. This is a thin sheath (latex or rubber) that is worn over the penis during sexual intercourse. It can be used with spermicide to increase effectiveness.  Female condom. This is a soft, loose-fitting sheath that is put into the vagina before sexual intercourse.  Diaphragm. This is a soft, latex, dome-shaped barrier that must be fitted by a health care provider. It is inserted into the vagina, along with a spermicidal jelly. It is inserted before intercourse. The diaphragm should be left in the vagina for 6 to 8 hours after intercourse.  Cervical cap. This is a round, soft, latex or plastic cup that fits over the cervix and must be fitted by a health care provider. The cap can be left in place for up to 48 hours after intercourse.  Sponge. This is a soft, circular piece of polyurethane foam. The sponge has spermicide in it. It is inserted into the vagina after wetting it and before sexual intercourse.  Spermicides. These are chemicals that kill or block sperm from entering the cervix and uterus. They come in the form of creams, jellies, suppositories, foam, or tablets. They do not require a   prescription. They are inserted into the vagina with an applicator before having sexual intercourse. The process must be repeated every time you have sexual intercourse. INTRAUTERINE CONTRACEPTION  Intrauterine device (IUD). This is a T-shaped device that is put in a woman's uterus  during a menstrual period to prevent pregnancy. There are 2 types:  Copper IUD. This type of IUD is wrapped in copper wire and is placed inside the uterus. Copper makes the uterus and fallopian tubes produce a fluid that kills sperm. It can stay in place for 10 years.  Hormone IUD. This type of IUD contains the hormone progestin (synthetic progesterone). The hormone thickens the cervical mucus and prevents sperm from entering the uterus, and it also thins the uterine lining to prevent implantation of a fertilized egg. The hormone can weaken or kill the sperm that get into the uterus. It can stay in place for 3-5 years, depending on which type of IUD is used. PERMANENT METHODS OF CONTRACEPTION  Female tubal ligation. This is when the woman's fallopian tubes are surgically sealed, tied, or blocked to prevent the egg from traveling to the uterus.  Hysteroscopic sterilization. This involves placing a small coil or insert into each fallopian tube. Your doctor uses a technique called hysteroscopy to do the procedure. The device causes scar tissue to form. This results in permanent blockage of the fallopian tubes, so the sperm cannot fertilize the egg. It takes about 3 months after the procedure for the tubes to become blocked. You must use another form of birth control for these 3 months.  Female sterilization. This is when the female has the tubes that carry sperm tied off (vasectomy).This blocks sperm from entering the vagina during sexual intercourse. After the procedure, the man can still ejaculate fluid (semen). NATURAL PLANNING METHODS  Natural family planning. This is not having sexual intercourse or using a barrier method (condom, diaphragm, cervical cap) on days the woman could become pregnant.  Calendar method. This is keeping track of the length of each menstrual cycle and identifying when you are fertile.  Ovulation method. This is avoiding sexual intercourse during ovulation.  Symptothermal  method. This is avoiding sexual intercourse during ovulation, using a thermometer and ovulation symptoms.  Post-ovulation method. This is timing sexual intercourse after you have ovulated. Regardless of which type or method of contraception you choose, it is important that you use condoms to protect against the transmission of sexually transmitted infections (STIs). Talk with your health care provider about which form of contraception is most appropriate for you. Document Released: 07/09/2005 Document Revised: 07/14/2013 Document Reviewed: 01/01/2013 [employer] Patient Information 2015 [employer]. This information is not intended to replace advice given to you by your health care provider. Make sure you discuss any questions you have with your health care provider.  

## 2014-03-03 NOTE — Progress Notes (Signed)
   Subjective:    Patient ID: Yesenia Weeks, female    DOB: 13-Dec-1975, 38 y.o.   MRN: [id number]  HPI Comments: Yesenia Weeks comes in today to discuss birth control.  She reports previously been on Depo shots, which she tolerated well.  She is interested in other forms of birth control such as nexplanon and IUDs.  She denies any current vaginal symptoms: No discharge, pain, bleeding.  She denies any current fevers, chills, or signs of infection.  She is a current smoker, but denies any history of blood clots.      Review of Systems  Constitutional: Negative for fever and chills.  Genitourinary: Negative for vaginal bleeding, vaginal discharge and vaginal pain.       Objective:   Physical Exam  Vitals reviewed. Constitutional: She appears well-developed and well-nourished.  Cardiovascular: Normal rate and regular rhythm.   No LE swelling or calf pain   Assessment/Plan:      See Problem Focused Assessment & Plan

## 2014-03-03 NOTE — Assessment & Plan Note (Signed)
Pregnancy test negative today - Depo shot given - Discussed IUD and nexplanon which she is considering and fill f/u with her PCP

## 2014-03-31 ENCOUNTER — Other Ambulatory Visit (HOSPITAL_COMMUNITY)
Admission: RE | Admit: 2014-03-31 | Discharge: 2014-03-31 | Disposition: A | Discharge status: Home / Self Care | Payer: Medicaid Other | Source: Ambulatory Visit | Attending: Family Medicine | Admitting: Family Medicine

## 2014-03-31 ENCOUNTER — Encounter: Payer: Self-pay | Admitting: Family Medicine

## 2014-03-31 ENCOUNTER — Ambulatory Visit (INDEPENDENT_AMBULATORY_CARE_PROVIDER_SITE_OTHER): Payer: Medicaid Other | Admitting: Family Medicine

## 2014-03-31 VITALS — BP 138/91 | HR 86 | Temp 98.4°F | Ht 67.0 in | Wt 163.6 lb

## 2014-03-31 DIAGNOSIS — N898 Other specified noninflammatory disorders of vagina: Secondary | ICD-10-CM

## 2014-03-31 DIAGNOSIS — Z01419 Encounter for gynecological examination (general) (routine) without abnormal findings: Secondary | ICD-10-CM | POA: Diagnosis not present

## 2014-03-31 DIAGNOSIS — Z13220 Encounter for screening for lipoid disorders: Secondary | ICD-10-CM | POA: Diagnosis not present

## 2014-03-31 DIAGNOSIS — Z113 Encounter for screening for infections with a predominantly sexual mode of transmission: Secondary | ICD-10-CM | POA: Insufficient documentation

## 2014-03-31 DIAGNOSIS — F339 Major depressive disorder, recurrent, unspecified: Secondary | ICD-10-CM

## 2014-03-31 DIAGNOSIS — R0600 Dyspnea, unspecified: Secondary | ICD-10-CM | POA: Insufficient documentation

## 2014-03-31 DIAGNOSIS — R0609 Other forms of dyspnea: Secondary | ICD-10-CM

## 2014-03-31 DIAGNOSIS — Z803 Family history of malignant neoplasm of breast: Secondary | ICD-10-CM | POA: Insufficient documentation

## 2014-03-31 DIAGNOSIS — R0989 Other specified symptoms and signs involving the circulatory and respiratory systems: Secondary | ICD-10-CM

## 2014-03-31 LAB — POCT WET PREP (WET MOUNT): Clue Cells Wet Prep Whiff POC: NEGATIVE

## 2014-03-31 LAB — COMPREHENSIVE METABOLIC PANEL
ALT: 20 U/L (ref 0–35)
AST: 21 U/L (ref 0–37)
Albumin: 4.1 g/dL (ref 3.5–5.2)
Alkaline Phosphatase: 75 U/L (ref 39–117)
BUN: 16 mg/dL (ref 6–23)
CO2: 24 mEq/L (ref 19–32)
Calcium: 9.4 mg/dL (ref 8.4–10.5)
Chloride: 104 mEq/L (ref 96–112)
Creat: 0.9 mg/dL (ref 0.50–1.10)
Glucose, Bld: 87 mg/dL (ref 70–99)
Potassium: 4.3 mEq/L (ref 3.5–5.3)
Sodium: 137 mEq/L (ref 135–145)
Total Bilirubin: 0.4 mg/dL (ref 0.2–1.2)
Total Protein: 7.5 g/dL (ref 6.0–8.3)

## 2014-03-31 LAB — LIPID PANEL
Cholesterol: 159 mg/dL (ref 0–200)
HDL: 72 mg/dL (ref >39)
LDL Cholesterol: 72 mg/dL (ref 0–99)
Total CHOL/HDL Ratio: 2.2 Ratio
Triglycerides: 75 mg/dL (ref <150)
VLDL: 15 mg/dL (ref 0–40)

## 2014-03-31 MED ORDER — RANITIDINE HCL 150 MG PO TABS: 150.0000 mg | ORAL_TABLET | Freq: Two times a day (BID) | ORAL | Status: DC

## 2014-03-31 MED ORDER — BUPROPION HCL ER (XL) 300 MG PO TB24: 300.0000 mg | ORAL_TABLET | Freq: Every day | ORAL | Status: DC

## 2014-03-31 NOTE — Patient Instructions (Signed)
Wellbutrin: I refilled  Shortness of breath: checking chest xray, start ranitidine 16m twice a day  STD tests: we'll call you or send a letter  I am referring you to genetic counselor for your family history of breast cancer. You will get a phone call to schedule this appointment.   Check your BP a few times a week at home and write down the numbers. Bring with you to your next appointment.  Follow up in 3 weeks to review results and discuss depression, shortness of breath, and blood pressure.   Health Maintenance Adopting a healthy lifestyle and getting preventive care can go a long way to promote health and wellness. Talk with your health care provider about what schedule of regular examinations is right for you. This is a good chance for you to check in with your provider about disease prevention and staying healthy. In between checkups, there are plenty of things you can do on your own. Experts have done a lot of research about which lifestyle changes and preventive measures are most likely to keep you healthy. Ask your health care provider for more information. WEIGHT AND DIET  Eat a healthy diet  Be sure to include plenty of vegetables, fruits, low-fat dairy products, and lean protein.  Do not eat a lot of foods high in solid fats, added sugars, or salt.  Get regular exercise. This is one of the most important things you can do for your health.  Most adults should exercise for at least 150 minutes each week. The exercise should increase your heart rate and make you sweat (moderate-intensity exercise).  Most adults should also do strengthening exercises at least twice a week. This is in addition to the moderate-intensity exercise.  Maintain a healthy weight  Body mass index (BMI) is a measurement that can be used to identify possible weight problems. It estimates body fat based on height and weight. Your health care provider can help determine your BMI and help you achieve or  maintain a healthy weight.  For females 294years of age and older:   A BMI below 18.5 is considered underweight.  A BMI of 18.5 to 24.9 is normal.  A BMI of 25 to 29.9 is considered overweight.  A BMI of 30 and above is considered obese.  Watch levels of cholesterol and blood lipids  You should start having your blood tested for lipids and cholesterol at 37years of age, then have this test every 5 years.  You may need to have your cholesterol levels checked more often if:  Your lipid or cholesterol levels are high.  You are older than 37years of age.  You are at high risk for heart disease.  CANCER SCREENING   Lung Cancer  Lung cancer screening is recommended for adults 563874years old who are at high risk for lung cancer because of a history of smoking.  A yearly low-dose CT scan of the lungs is recommended for people who:  Currently smoke.  Have quit within the past 15 years.  Have at least a 30-pack-year history of smoking. A pack year is smoking an average of one pack of cigarettes a day for 1 year.  Yearly screening should continue until it has been 15 years since you quit.  Yearly screening should stop if you develop a health problem that would prevent you from having lung cancer treatment.  Breast Cancer  Practice breast self-awareness. This means understanding how your breasts normally appear and feel.  It  also means doing regular breast self-exams. Let your health care provider know about any changes, no matter how small.  If you are in your 20s or 30s, you should have a clinical breast exam (CBE) by a health care provider every 1-3 years as part of a regular health exam.  If you are 92 or older, have a CBE every year. Also consider having a breast X-ray (mammogram) every year.  If you have a family history of breast cancer, talk to your health care provider about genetic screening.  If you are at high risk for breast cancer, talk to your health care  provider about having an MRI and a mammogram every year.  Breast cancer gene (BRCA) assessment is recommended for women who have family members with BRCA-related cancers. BRCA-related cancers include:  Breast.  Ovarian.  Tubal.  Peritoneal cancers.  Results of the assessment will determine the need for genetic counseling and BRCA1 and BRCA2 testing. Cervical Cancer Routine pelvic examinations to screen for cervical cancer are no longer recommended for nonpregnant women who are considered low risk for cancer of the pelvic organs (ovaries, uterus, and vagina) and who do not have symptoms. A pelvic examination may be necessary if you have symptoms including those associated with pelvic infections. Ask your health care provider if a screening pelvic exam is right for you.   The Pap test is the screening test for cervical cancer for women who are considered at risk.  If you had a hysterectomy for a problem that was not cancer or a condition that could lead to cancer, then you no longer need Pap tests.  If you are older than 65 years, and you have had normal Pap tests for the past 10 years, you no longer need to have Pap tests.  If you have had past treatment for cervical cancer or a condition that could lead to cancer, you need Pap tests and screening for cancer for at least 20 years after your treatment.  If you no longer get a Pap test, assess your risk factors if they change (such as having a new sexual partner). This can affect whether you should start being screened again.  Some women have medical problems that increase their chance of getting cervical cancer. If this is the case for you, your health care provider may recommend more frequent screening and Pap tests.  The human papillomavirus (HPV) test is another test that may be used for cervical cancer screening. The HPV test looks for the virus that can cause cell changes in the cervix. The cells collected during the Pap test can be  tested for HPV.  The HPV test can be used to screen women 68 years of age and older. Getting tested for HPV can extend the interval between normal Pap tests from three to five years.  An HPV test also should be used to screen women of any age who have unclear Pap test results.  After 37 years of age, women should have HPV testing as often as Pap tests.  Colorectal Cancer  This type of cancer can be detected and often prevented.  Routine colorectal cancer screening usually begins at 37 years of age and continues through 37 years of age.  Your health care provider may recommend screening at an earlier age if you have risk factors for colon cancer.  Your health care provider may also recommend using home test kits to check for hidden blood in the stool.  A small camera at the end  of a tube can be used to examine your colon directly (sigmoidoscopy or colonoscopy). This is done to check for the earliest forms of colorectal cancer.  Routine screening usually begins at age 76.  Direct examination of the colon should be repeated every 5-10 years through 37 years of age. However, you may need to be screened more often if early forms of precancerous polyps or small growths are found. Skin Cancer  Check your skin from head to toe regularly.  Tell your health care provider about any new moles or changes in moles, especially if there is a change in a mole's shape or color.  Also tell your health care provider if you have a mole that is larger than the size of a pencil eraser.  Always use sunscreen. Apply sunscreen liberally and repeatedly throughout the day.  Protect yourself by wearing long sleeves, pants, a wide-brimmed hat, and sunglasses whenever you are outside. HEART DISEASE, DIABETES, AND HIGH BLOOD PRESSURE   Have your blood pressure checked at least every 1-2 years. High blood pressure causes heart disease and increases the risk of stroke.  If you are between 84 years and 57 years  old, ask your health care provider if you should take aspirin to prevent strokes.  Have regular diabetes screenings. This involves taking a blood sample to check your fasting blood sugar level.  If you are at a normal weight and have a low risk for diabetes, have this test once every three years after 37 years of age.  If you are overweight and have a high risk for diabetes, consider being tested at a younger age or more often. PREVENTING INFECTION  Hepatitis B  If you have a higher risk for hepatitis B, you should be screened for this virus. You are considered at high risk for hepatitis B if:  You were born in a country where hepatitis B is common. Ask your health care provider which countries are considered high risk.  Your parents were born in a high-risk country, and you have not been immunized against hepatitis B (hepatitis B vaccine).  You have HIV or AIDS.  You use needles to inject street drugs.  You live with someone who has hepatitis B.  You have had sex with someone who has hepatitis B.  You get hemodialysis treatment.  You take certain medicines for conditions, including cancer, organ transplantation, and autoimmune conditions. Hepatitis C  Blood testing is recommended for:  Everyone born from [birth year] through 1964.  Anyone with known risk factors for hepatitis C. Sexually transmitted infections (STIs)  You should be screened for sexually transmitted infections (STIs) including gonorrhea and chlamydia if:  You are sexually active and are younger than 37 years of age.  You are older than 37 years of age and your health care provider tells you that you are at risk for this type of infection.  Your sexual activity has changed since you were last screened and you are at an increased risk for chlamydia or gonorrhea. Ask your health care provider if you are at risk.  If you do not have HIV, but are at risk, it may be recommended that you take a prescription medicine  daily to prevent HIV infection. This is called pre-exposure prophylaxis (PrEP). You are considered at risk if:  You are sexually active and do not regularly use condoms or know the HIV status of your partner(s).  You take drugs by injection.  You are sexually active with a partner who has HIV.  Talk with your health care provider about whether you are at high risk of being infected with HIV. If you choose to begin PrEP, you should first be tested for HIV. You should then be tested every 3 months for as long as you are taking PrEP.  PREGNANCY   If you are premenopausal and you may become pregnant, ask your health care provider about preconception counseling.  If you may become pregnant, take 400 to 800 micrograms (mcg) of folic acid every day.  If you want to prevent pregnancy, talk to your health care provider about birth control (contraception). OSTEOPOROSIS AND MENOPAUSE   Osteoporosis is a disease in which the bones lose minerals and strength with aging. This can result in serious bone fractures. Your risk for osteoporosis can be identified using a bone density scan.  If you are 41 years of age or older, or if you are at risk for osteoporosis and fractures, ask your health care provider if you should be screened.  Ask your health care provider whether you should take a calcium or vitamin D supplement to lower your risk for osteoporosis.  Menopause may have certain physical symptoms and risks.  Hormone replacement therapy may reduce some of these symptoms and risks. Talk to your health care provider about whether hormone replacement therapy is right for you.  HOME CARE INSTRUCTIONS   Schedule regular health, dental, and eye exams.  Stay current with your immunizations.   Do not use any tobacco products including cigarettes, chewing tobacco, or electronic cigarettes.  If you are pregnant, do not drink alcohol.  If you are breastfeeding, limit how much and how often you drink  alcohol.  Limit alcohol intake to no more than 1 drink per day for nonpregnant women. One drink equals 12 ounces of beer, 5 ounces of wine, or 1 ounces of hard liquor.  Do not use street drugs.  Do not share needles.  Ask your health care provider for help if you need support or information about quitting drugs.  Tell your health care provider if you often feel depressed.  Tell your health care provider if you have ever been abused or do not feel safe at home. Document Released: 01/22/2011 Document Revised: 11/23/2013 Document Reviewed: 06/10/2013 [facility] Patient Information 2015 [place], [place]. This information is not intended to replace advice given to you by your health care provider. Make sure you discuss any questions you have with your health care provider.

## 2014-03-31 NOTE — Progress Notes (Signed)
Patient ID: Yesenia Weeks, female   DOB: 25-Jan-1976, 38 y.o.   MRN: [id number]  HPI:  Patient presents today for a well woman exam.   Shortness of breath: has had episodes of being aware of her breathing about 2-3 times a week. Lasts just a minute. Has gone on for several months. Happens when she's sitting still. No chest pain or swelling. Better when she takes a deep breath and calms down. Does not exercise. Is not worse with exertion.   Depression: taking wellbutrin  daily. Thinks not working quite as well as used to. No SI/HI. Needs refill today.  Used to smoke only when she drinks but now smoking when not drinking. Drinks 2-3 times per week, 60oz of beer or a few shots of liquor. Smoking 1/2 ppd.  Concerns today: shortness of breath, see above Periods: on depo so rare periods Contraception: depo Pelvic symptoms: does have vaginal discharge (states always has discharge) Sexual activity: one female partner in last year STD Screening: would like today Pap smear status: up to date Exercise: none Diet: has had decreased appetite recently  ROS: See HPI  PMFSH:  Cancers in family: breast cancer (grandmother, three aunts, cousin diagnosed at age 32). Unsure about genetic testing. No ovarian cancer or colon cancer hx. Grandfather paternal had lung cancer.  PHYSICAL EXAM: BP 138/91  Pulse 86  Temp(Src) 98.4 F (36.9 C) (Oral)  Ht  (1.702 m)  Wt 163 lb 9.6 oz (74.208 kg)  BMI 25.62 kg/m2 Gen: NAD, pleasant, cooperative HEENT: NCAT, PERRL, no palpable thyromegaly or anterior cervical lymphadenopathy Heart: RRR, no murmurs Lungs: CTAB, NWOB Abdomen: soft, nontender to palpation Neuro: grossly nonfocal, speech normal GU: normal appearing external genitalia without lesions. Vagina is moist with white discharge. Cervix normal in appearance. No cervical motion tenderness or tenderness on bimanual exam. No adnexal masses.   ASSESSMENT/PLAN:  # Health maintenance:  -STD screening:  gc/chl, wet prep, HIV, RPR done today -pap smear: up to date -mammogram: refer to genetic counseling for eval given strong family hx of breast cancer -lipid screening: check lipids & CMET today -handout given on health maintenance topics  Dyspnea Etiology not clear by hx. Suspect anxiety or GERD. At pt requeset, will check CXR. Will also rx ranitidine  BID in the event acid reflux is contributing. F/u in a few weeks to reassess.  DEPRESSION, MAJOR, RECURRENT Did not have time to fully address today as pt was here for a physical and also had other concerns to address. Will refill wellbutrin x 1 month but I will need to see her back in ~3 weeks to reassess and consider med change.  Family history of breast cancer Strong family hx of breast cancer. Will refer to genetic counselor for recommendations.    FOLLOW UP: F/u in 3 weeks for shortness of breath & depression.  [place] J. Pollie Meyer, MD [facility]

## 2014-03-31 NOTE — Assessment & Plan Note (Signed)
Strong family hx of breast cancer. Will refer to genetic counselor for recommendations.

## 2014-03-31 NOTE — Assessment & Plan Note (Signed)
Did not have time to fully address today as pt was here for a physical and also had other concerns to address. Will refill wellbutrin x 1 month but I will need to see her back in ~3 weeks to reassess and consider med change.

## 2014-03-31 NOTE — Assessment & Plan Note (Signed)
Etiology not clear by hx. Suspect anxiety or GERD. At pt requeset, will check CXR. Will also rx ranitidine  BID in the event acid reflux is contributing. F/u in a few weeks to reassess.

## 2014-04-01 LAB — RPR

## 2014-04-01 LAB — HIV ANTIBODY (ROUTINE TESTING W REFLEX): HIV 1&2 Ab, 4th Generation: NONREACTIVE

## 2014-04-04 ENCOUNTER — Telehealth: Payer: Self-pay | Admitting: Internal Medicine

## 2014-04-04 MED ORDER — VALACYCLOVIR HCL 500 MG PO TABS: 500.0000 mg | ORAL_TABLET | Freq: Every day | ORAL | Status: DC

## 2014-04-04 NOTE — Telephone Encounter (Signed)
I received a call from [name] on the family medicine on call pager today requesting refill of her Valtrex that she takes daily for suppressive therapy. She denies currently having an outbreak. Last refill on her bottle she claims was 03/05/14 (ordered by pcp on 11/2013 with 3 refills). I have refilled the prescription today with NO refills and will have pcp follow up and decide on future refills.   Signed: Darden Palmer, MD PGY-3, Internal Medicine Resident 04/04/2014,11:16 AM

## 2014-04-05 ENCOUNTER — Telehealth: Payer: Self-pay | Admitting: *Deleted

## 2014-04-05 NOTE — Telephone Encounter (Signed)
Message copied by Pamelia Hoit on Mon Apr 05, 2014  9:47 AM ------      Message from: Latrelle Dodrill      Created: Fri Apr 02, 2014 12:39 PM       Please inform pt that all testing (STD, cholesterol, kidneys, liver) was normal ------

## 2014-04-05 NOTE — Telephone Encounter (Signed)
Pt informed that tests were normal. Yesenia Weeks, Brevan Luberto CMA

## 2014-04-26 ENCOUNTER — Ambulatory Visit: Payer: Medicaid Other | Admitting: Family Medicine

## 2014-05-10 ENCOUNTER — Telehealth: Payer: Self-pay | Admitting: Family Medicine

## 2014-05-10 ENCOUNTER — Telehealth: Payer: Self-pay | Admitting: Genetic Counselor

## 2014-05-10 NOTE — Telephone Encounter (Signed)
Pt calling back, says she spoke to someone at the health dept and pt is going to try to get her medication through their medication assistance program. They just need something showing why pt is taking the medication.

## 2014-05-10 NOTE — Telephone Encounter (Signed)
S/W PATIENT AND GAVE GENETIC APPT FOR 11/11 @ 11 W/KAREN POWELL

## 2014-05-10 NOTE — Telephone Encounter (Signed)
Pt is needing a refill on valtrex, says there are no more refills left. Also wants to know if a nurse can call her to see if there is a pharmacy that might have her medication cheaper or if there is another program that might help get her medication? Pt no longer qualifies for medicaid and will have to pay out of pocket.

## 2014-05-11 NOTE — Telephone Encounter (Signed)
Will forward to PCP for refill request.

## 2014-05-12 MED ORDER — VALACYCLOVIR HCL 500 MG PO TABS: ORAL_TABLET | ORAL | Status: DC

## 2014-05-12 NOTE — Telephone Encounter (Signed)
Patient calling again regarding Valtex, currently having outbreak. Would like rx sent to [facility].

## 2014-05-12 NOTE — Telephone Encounter (Signed)
LMOVM for pt to call us back. [place], [place] CMA  

## 2014-05-12 NOTE — Telephone Encounter (Signed)
Rx faxed to [facility]. Please inform pt that if she is currently having an outbreak she should take one pill twice a day for 3 days, then go back to taking one pill daily.  Thanks, Latrelle DodrillBrittany J Suly Vukelich, MD

## 2014-05-24 ENCOUNTER — Encounter: Payer: Self-pay | Admitting: Family Medicine

## 2014-06-02 ENCOUNTER — Encounter: Payer: Medicaid Other | Admitting: Genetic Counselor

## 2014-06-02 ENCOUNTER — Other Ambulatory Visit: Payer: Medicaid Other

## 2014-07-21 ENCOUNTER — Encounter (HOSPITAL_COMMUNITY): Payer: Self-pay

## 2014-07-21 ENCOUNTER — Emergency Department (INDEPENDENT_AMBULATORY_CARE_PROVIDER_SITE_OTHER)
Admission: EM | Admit: 2014-07-21 | Discharge: 2014-07-21 | Disposition: A | Discharge status: Home / Self Care | Payer: Self-pay | Source: Home / Self Care | Attending: Emergency Medicine | Admitting: Emergency Medicine

## 2014-07-21 ENCOUNTER — Emergency Department (INDEPENDENT_AMBULATORY_CARE_PROVIDER_SITE_OTHER): Payer: Self-pay

## 2014-07-21 DIAGNOSIS — S99919A Unspecified injury of unspecified ankle, initial encounter: Secondary | ICD-10-CM

## 2014-07-21 DIAGNOSIS — S93401A Sprain of unspecified ligament of right ankle, initial encounter: Secondary | ICD-10-CM

## 2014-07-21 MED ORDER — HYDROCODONE-ACETAMINOPHEN 5-325 MG PO TABS
2.0000 | ORAL_TABLET | Freq: Once | ORAL | Status: AC
  Administered 2014-07-21: 2 via ORAL

## 2014-07-21 MED ORDER — HYDROCODONE-ACETAMINOPHEN 5-325 MG PO TABS: ORAL_TABLET | ORAL | Status: DC

## 2014-07-21 MED ORDER — HYDROCODONE-ACETAMINOPHEN 5-325 MG PO TABS
ORAL_TABLET | ORAL | Status: AC
  Filled 2014-07-21: qty 2

## 2014-07-21 NOTE — Discharge Instructions (Signed)

## 2014-07-21 NOTE — ED Notes (Signed)
C/o twisted ankle last PM. Used ice, elevation

## 2014-07-21 NOTE — ED Provider Notes (Signed)
Chief Complaint   Ankle Injury   History of Present Illness   Yesenia Weeks is a 38 year old female who was going down some steps at home yesterday afternoon when she twisted her right ankle. She's not hear pop but noted immediate swelling. She was unable to bear weight at the time of the accident and is still unable to put any weight on it. She's been using crutches. It hurts over the medial and lateral malleolus with swelling mostly over the lateral malleolus. She denies any pain in the foot. She states her feet are always numb and she's not sure why.  Review of Systems   Other than as noted above, the patient denies any of the following symptoms: Systemic:  No fevers or chills.   Musculoskeletal:  No joint pain or swelling, back pain, or neck pain. Neurological:  No muscular weakness or paresthesias.  PMFSH   Past medical history, family history, social history, meds, and allergies were reviewed.   Physical Examination     Vital signs:  BP 103/66 mmHg  Pulse 85  Temp(Src) 99.3 F (37.4 C) (Oral)  SpO2 100% Gen:  Alert and oriented times 3.  In no distress. Musculoskeletal: Exam of the ankle reveals swelling over lateral malleolus and . pain to palpation over medial and lateral malleolus. Limited range of motion with pain. Anterior drawer sign negative.  Talar tilt normal. Squeeze test positive. Achilles tendon, peroneal tendon, and tibialis posterior were intact. Otherwise, all joints had a full a ROM with no swelling, bruising or deformity.  No edema, pulses full. Extremities were warm and pink.  Capillary refill was brisk.  Skin:  Clear, warm and dry.  No rash. Neuro:  Alert and oriented times 3.  Muscle strength was normal.  Sensation was intact to light touch.   Radiology   Dg Ankle Complete Right  07/21/2014   CLINICAL DATA:  Per pt: fell off the porch last night. Patient stated that both sides of the ankle hurt with pain shooting up the leg. Patient pointed to the  anterior lateral malleolus, medial malleolus and anterior right ankle, with radiating pain to distal right lower leg. No prior injury to the right ankle.  EXAM: RIGHT ANKLE - COMPLETE 3+ VIEW  COMPARISON:  None.  FINDINGS: There is mild soft tissue swelling along the lateral malleolus. No acute fracture or subluxation. No radiopaque foreign body or soft tissue gas.  IMPRESSION: Soft tissue swelling without acute fracture.   Electronically Signed   By: Rosalie GumsBeth  Brown M.D.   On: 07/21/2014 12:15    I reviewed the images independently and personally and concur with the radiologist's findings.  Course in Urgent [facility]   The following medications were given:  Medications  HYDROcodone-acetaminophen (NORCO/VICODIN) 5-325 MG per tablet 2 tablet (2 tablets Oral Given 07/21/14 1215)    Patient was placed in an ASO brace. She already has crutches.  Assessment   The primary encounter diagnosis was Ankle sprain, right, initial encounter. A diagnosis of Ankle injury was also pertinent to this visit.  Plan     1.  Meds:  The following meds were prescribed:   New Prescriptions   HYDROCODONE-ACETAMINOPHEN (NORCO/VICODIN) 5-325 MG PER TABLET    1 to 2 tabs every 4 to 6 hours as needed for pain.    2.  Patient Education/Counseling:  The patient was given appropriate handouts, self care instructions, including rest and activity, elevation, application of ice and compression, and instructed in pain control.  Suggested  she start on ankle exercises in 3 days.  3.  Follow up:  The patient was told to follow up  with orthopedics if no better in 2 weeks or sooner if becoming worse in any way, and given some red flag symptoms such as increasing pain or neurological symptoms which would prompt immediate return.       Reuben Likesavid C Mak Bonny, MD 07/21/14 [phone]

## 2014-07-21 NOTE — ED Notes (Signed)
Instructed in use of ASO, crutches

## 2014-09-08 ENCOUNTER — Ambulatory Visit: Payer: Medicaid Other | Admitting: Family Medicine

## 2014-09-10 ENCOUNTER — Encounter (HOSPITAL_COMMUNITY): Payer: Self-pay

## 2014-09-10 ENCOUNTER — Emergency Department (INDEPENDENT_AMBULATORY_CARE_PROVIDER_SITE_OTHER)
Admission: EM | Admit: 2014-09-10 | Discharge: 2014-09-10 | Disposition: A | Discharge status: Home / Self Care | Payer: Self-pay | Source: Home / Self Care | Attending: Family Medicine | Admitting: Family Medicine

## 2014-09-10 DIAGNOSIS — T192XXA Foreign body in vulva and vagina, initial encounter: Secondary | ICD-10-CM

## 2014-09-10 LAB — POCT PREGNANCY, URINE: Preg Test, Ur: NEGATIVE

## 2014-09-10 LAB — POCT URINALYSIS DIP (DEVICE)
Bilirubin Urine: NEGATIVE
Glucose, UA: NEGATIVE mg/dL
Ketones, ur: NEGATIVE mg/dL
Leukocytes, UA: NEGATIVE
Nitrite: NEGATIVE
Protein, ur: NEGATIVE mg/dL
Specific Gravity, Urine: >=1.03 (ref 1.005–1.030)
Urobilinogen, UA: 0.2 mg/dL (ref 0.0–1.0)
pH: 5.5 (ref 5.0–8.0)

## 2014-09-10 NOTE — ED Provider Notes (Signed)
CSN: [id number]     Arrival date & time 09/10/14  1213 History   First MD Initiated Contact with Patient 09/10/14 1229     Chief Complaint  Patient presents with  . Vaginitis   (Consider location/radiation/quality/duration/timing/severity/associated sxs/prior Treatment) HPI Comments: Patient states she had intercourse with her boyfriend and a condom was used. States that when she went to use the bathroom this morning she saw the tip of the condom in the toilet water and became concerned that additional portions of condom remain in her vagina.   The history is provided by the patient.    Past Medical History  Diagnosis Date  . ADHD (attention deficit hyperactivity disorder)   . Anxiety   . Depression   . Substance abuse   . Anemia     iron def  . Allergy   . Dysmenorrhea   . BV (bacterial vaginosis)     recurrent-uses boric acid   Past Surgical History  Procedure Laterality Date  . Hernia repair     History reviewed. No pertinent family history. History  Substance Use Topics  . Smoking status: Current Every Day Smoker  . Smokeless tobacco: Never Used  . Alcohol Use: Yes     Comment: 40 onces of beer a night   OB History    Gravida Para Term Preterm AB TAB SAB Ectopic Multiple Living   3 2 2  1  1   2      Review of Systems  All other systems reviewed and are negative.   Allergies  Review of patient's allergies indicates no known allergies.  Home Medications   Prior to Admission medications   Medication Sig Start Date End Date Taking? Authorizing Provider  buPROPion (WELLBUTRIN XL) 300 MG 24 hr tablet Take 1 tablet (300 mg total) by mouth daily. 03/31/14   Latrelle DodrillBrittany J McIntyre, MD  cetirizine (ZYRTEC) 10 MG tablet take 1 tablet by mouth once daily 10/12/13   Latrelle DodrillBrittany J McIntyre, MD  fluconazole (DIFLUCAN) 150 MG tablet Take 1 tablet (150 mg total) by mouth once. Repeat in 3 days if not better. 02/10/14   Latrelle DodrillBrittany J McIntyre, MD  fluticasone (FLONASE) 50 MCG/ACT nasal  spray Place 2 sprays into both nostrils daily. 11/08/13   Servando Salinaatherine H Rossi, NP  HYDROcodone-acetaminophen (NORCO/VICODIN) 5-325 MG per tablet 1 to 2 tabs every 4 to 6 hours as needed for pain. 07/21/14   Reuben Likesavid C Keller, MD  ranitidine (ZANTAC) 150 MG tablet Take 1 tablet (150 mg total) by mouth 2 (two) times daily. 03/31/14   Latrelle DodrillBrittany J McIntyre, MD  valACYclovir (VALTREX) 500 MG tablet Take 1 tab PO BID x 3 days, then take 1 tab PO daily thereafter 05/12/14   Latrelle DodrillBrittany J McIntyre, MD   BP 136/84 mmHg  Pulse 87  Temp(Src) 98.2 F (36.8 C) (Oral)  Resp 18  SpO2 100% Physical Exam  Constitutional: She is oriented to person, place, and time. She appears well-developed and well-nourished. No distress.  HENT:  Head: Normocephalic and atraumatic.  Cardiovascular: Normal rate.   Pulmonary/Chest: Effort normal.  Genitourinary: Uterus normal. Pelvic exam was performed with patient supine. There is no rash, tenderness, lesion or injury on the right labia. There is no rash, tenderness, lesion or injury on the left labia. Cervix exhibits friability. Cervix exhibits no motion tenderness and no discharge. Right adnexum displays no mass, no tenderness and no fullness. No erythema, tenderness or bleeding in the vagina. No foreign body around the vagina. No signs of injury  around the vagina. No vaginal discharge found.    Able to visualize entire vaginal vault and entire circumferential vault around cervix and no FB visualized and no FB felt with bimanual exam. Had second pelvic/speculum exam performed by colleague while patient in clinic and no FB identified during second exam.   Musculoskeletal: Normal range of motion.  Neurological: She is alert and oriented to person, place, and time.  Skin: Skin is warm and dry.  Psychiatric: She has a normal mood and affect. Her behavior is normal.  Nursing note and vitals reviewed.   ED Course  Procedures (including critical care time) Labs Review Labs Reviewed    POCT URINALYSIS DIP (DEVICE) - Abnormal; Notable for the following:    Hgb urine dipstick TRACE (*)    All other components within normal limits  POCT PREGNANCY, URINE    Imaging Review No results found.   MDM   1. Vaginal foreign body, initial encounter    No retained vaginal foreign body identified or removed during two separate pelvic exams while at clinic. Could only advise that if patient remains concerned, she may present at [facility] for re-evaluation and possible transvaginal ultrasound to determine if FB present.     Ria Clock, [place] 09/10/14 1331

## 2014-09-10 NOTE — Discharge Instructions (Signed)
We were not able to identify any retained vaginal foreign body during today's pelvic exams. No retained vaginal foreign body identified or removed during two separate pelvic exams while at clinic. Would advise that if you remain concerned, please present [facility] for re-evaluation and possible transvaginal ultrasound to determine if foreign body  present. Vaginal Foreign Body A vaginal foreign body is any object that gets stuck or left inside the vagina. Vaginal foreign bodies left in the vagina for a long time can cause irritation and infection. In most cases, symptoms go away once the vaginal foreign body is found and removed. Rarely, a foreign object can break through the walls of the vagina and cause a serious infection inside the abdomen.  CAUSES  The most common vaginal foreign bodies are:  Tampons.  Contraceptive devices.  Toilet tissue left in the vagina.  Small objects that were placed in the vagina out of curiosity and got stuck.  A result of sexual abuse. SIGNS AND SYMPTOMS  Light vaginal bleeding.  Blood-tinged vaginal fluid (discharge).  Vaginal discharge that smells bad.  Vaginal itching or burning.  Redness, swelling, or rash near the opening of the vagina.  Abdominal pain.  Fever.  Burning or frequent urination. DIAGNOSIS  Your health care provider may be able to diagnose a vaginal foreign body based on the information you provide, your symptoms, and a physical exam. Your health care provider may also perform the following tests to check for infection:  A swab of the discharge to check under a microscope for bacteria (culture).  A urine culture.  An examination of the vagina with a small, lighted scope (vaginoscopy).  Imaging tests to get a picture of the inside of your vagina, such as:  Ultrasound.  X-ray.  MRI. TREATMENT  In most cases, a vaginal foreign body can be easily removed and the symptoms usually go away very quickly. Other  treatment may include:   If the vaginal foreign body is not easily removed, medicine may be given to make you go to sleep (general anesthesia) to have the object removed.  Emergency surgery may be necessary if an infection spreads through the walls of the vagina into the abdomen (acute abdomen). This is rare.  You may need to take antibiotic medicine if you have a vaginal or urinary tract infection. HOME CARE INSTRUCTIONS   Take medicines only as directed by your health care provider.  If you were prescribed an antibiotic medicine, finish it all even if you start to feel better.  Do not have sex or use tampons until your health care provider says it is okay.  Do not douche or use vaginal rinses unless your health care provider recommends it.  Keep all follow-up visits as directed by your health care provider. This is important. SEEK MEDICAL CARE IF:  You have abdominal pain or burning pain when urinating.  You have a fever. SEEK IMMEDIATE MEDICAL CARE IF:  You have heavy vaginal bleeding or discharge.   You have very bad abdominal pain.  MAKE SURE YOU:  Understand these instructions.  Will watch your condition.  Will get help right away if you are not doing well or get worse. Document Released: 11/23/2013 Document Reviewed: 05/08/2013 [facility] Patient Information 2015 [employer] [place]. This information is not intended to replace advice given to you by your health care provider. Make sure you discuss any questions you have with your health care provider.

## 2014-09-10 NOTE — ED Notes (Signed)
Concerned about vaginal irritation and poss retained vaginal FB.

## 2014-09-13 ENCOUNTER — Ambulatory Visit: Payer: Medicaid Other | Admitting: Family Medicine

## 2014-12-24 ENCOUNTER — Encounter: Payer: Self-pay | Admitting: Family Medicine

## 2014-12-24 ENCOUNTER — Telehealth: Payer: Self-pay | Admitting: Family Medicine

## 2014-12-24 NOTE — Telephone Encounter (Signed)
Red team, please clarify with patient exactly what she needs the letter to say. I can provide something that says that she has been evaluated and treated for these conditions in the past. Will this be adequate?  Thanks, Latrelle DodrillBrittany J Aleeah Greeno, MD

## 2014-12-24 NOTE — Telephone Encounter (Signed)
Pt is returning the doctors call. She said she needs the letter to say that she is being treated for ADHD, Anxiety, Depression by her doctor. She has to turn this into the school so that she can get additional assistance at college. jw

## 2014-12-24 NOTE — Telephone Encounter (Signed)
Letter written stating these diagnoses. Will place at front desk for patient to pick up. Red team, please inform patient.  Latrelle DodrillBrittany J Kaydon Husby, MD

## 2014-12-24 NOTE — Telephone Encounter (Signed)
Spoke with pt and she stated that she hasnt been in because she doesn't have insurance and all she need is a letter saying that she has those problems. Yesenia Weeks, CMA

## 2014-12-24 NOTE — Telephone Encounter (Signed)
[name] need something from provider stating her condition for ADHD and anxiety.  This is needed for school.  Need to take to the disability dept so she can get assistance for college.

## 2014-12-24 NOTE — Telephone Encounter (Signed)
I am not able to say that I am currently treating her for these issues because she has not followed up with me since September. I last prescribed 30 pills of wellbutrin in September and asked her to follow up but have not seen her since then.  She should schedule an appt and then we can discuss these conditions and also discuss the information she needs. Please inform patient.  Yesenia DodrillBrittany J Zorianna Taliaferro, MD

## 2014-12-27 NOTE — Telephone Encounter (Signed)
Pt informed. Deseree Blount, CMA  

## 2015-01-26 ENCOUNTER — Encounter (HOSPITAL_COMMUNITY): Payer: Self-pay | Admitting: *Deleted

## 2015-01-26 ENCOUNTER — Emergency Department (INDEPENDENT_AMBULATORY_CARE_PROVIDER_SITE_OTHER)
Admission: EM | Admit: 2015-01-26 | Discharge: 2015-01-26 | Disposition: A | Discharge status: Home / Self Care | Payer: Self-pay | Source: Home / Self Care | Attending: Family Medicine | Admitting: Family Medicine

## 2015-01-26 DIAGNOSIS — K120 Recurrent oral aphthae: Secondary | ICD-10-CM

## 2015-01-26 NOTE — ED Notes (Signed)
Tylenol  650  Mg  Sample  Given  To  Pt  Per  Dr  Artis Flockkindl

## 2015-01-26 NOTE — Discharge Instructions (Signed)
Care as discussed, return as needed. °

## 2015-01-26 NOTE — ED Provider Notes (Signed)
CSN: [id number]     Arrival date & time 01/26/15  1807 History   First MD Initiated Contact with Patient 01/26/15 1957     Chief Complaint  Patient presents with  . Oral Pain   (Consider location/radiation/quality/duration/timing/severity/associated sxs/prior Treatment) Patient is a 39 y.o. female presenting with mouth pain. The history is provided by the patient.  Oral Pain This is a new problem. The current episode started more than 2 days ago (onset after eating hot salmon and lesion persists in roof of mouth.). The problem has not changed since onset.Associated symptoms comments: Left ear pain..    Past Medical History  Diagnosis Date  . ADHD (attention deficit hyperactivity disorder)   . Anxiety   . Depression   . Substance abuse   . Anemia     iron def  . Allergy   . Dysmenorrhea   . BV (bacterial vaginosis)     recurrent-uses boric acid   Past Surgical History  Procedure Laterality Date  . Hernia repair     History reviewed. No pertinent family history. History  Substance Use Topics  . Smoking status: Current Every Day Smoker  . Smokeless tobacco: Never Used  . Alcohol Use: Yes     Comment: 40 onces of beer a night   OB History    Gravida Para Term Preterm AB TAB SAB Ectopic Multiple Living   3 2 2  1  1   2      Review of Systems  Constitutional: Negative.   HENT: Positive for ear pain and mouth sores. Negative for ear discharge.   Respiratory: Negative.     Allergies  Review of patient's allergies indicates no known allergies.  Home Medications   Prior to Admission medications   Medication Sig Start Date End Date Taking? Authorizing Provider  buPROPion (WELLBUTRIN XL) 300 MG 24 hr tablet Take 1 tablet (300 mg total) by mouth daily. 03/31/14   Latrelle DodrillBrittany J McIntyre, MD  cetirizine (ZYRTEC) 10 MG tablet take 1 tablet by mouth once daily 10/12/13   Latrelle DodrillBrittany J McIntyre, MD  fluconazole (DIFLUCAN) 150 MG tablet Take 1 tablet (150 mg total) by mouth once. Repeat  in 3 days if not better. 02/10/14   Latrelle DodrillBrittany J McIntyre, MD  fluticasone (FLONASE) 50 MCG/ACT nasal spray Place 2 sprays into both nostrils daily. 11/08/13   Servando Salinaatherine H Rossi, NP  HYDROcodone-acetaminophen (NORCO/VICODIN) 5-325 MG per tablet 1 to 2 tabs every 4 to 6 hours as needed for pain. 07/21/14   Reuben Likesavid C Keller, MD  ranitidine (ZANTAC) 150 MG tablet Take 1 tablet (150 mg total) by mouth 2 (two) times daily. 03/31/14   Latrelle DodrillBrittany J McIntyre, MD  valACYclovir (VALTREX) 500 MG tablet Take 1 tab PO BID x 3 days, then take 1 tab PO daily thereafter 05/12/14   Latrelle DodrillBrittany J McIntyre, MD   BP 134/86 mmHg  Pulse 75  Temp(Src) 98.6 F (37 C) (Oral)  Resp 16  SpO2 100%  LMP 12/27/2014 Physical Exam  Constitutional: She is oriented to person, place, and time. She appears well-developed and well-nourished.  HENT:  Head: Normocephalic.  Right Ear: External ear normal.  Left Ear: External ear normal.  Mouth/Throat:    Eyes: Pupils are equal, round, and reactive to light.  Neck: Normal range of motion. Neck supple.  Lymphadenopathy:    She has no cervical adenopathy.  Neurological: She is alert and oriented to person, place, and time.  Skin: Skin is warm and dry.  Nursing note and vitals  reviewed.   ED Course  Procedures (including critical care time) Labs Review Labs Reviewed - No data to display  Imaging Review No results found.   MDM   1. Oral aphthous ulcer        Linna Hoff, MD 01/26/15 2036

## 2015-01-26 NOTE — ED Notes (Signed)
Pt  Reports      She  Burned  The  Top  Of  Her  Mouth        5  Days  Ago  Eating  Salmon     -m  She  Reports  l  Earache   As   Well

## 2015-03-04 ENCOUNTER — Ambulatory Visit: Payer: Self-pay

## 2015-03-10 ENCOUNTER — Encounter (HOSPITAL_COMMUNITY): Payer: Self-pay | Admitting: *Deleted

## 2015-03-10 ENCOUNTER — Emergency Department (HOSPITAL_COMMUNITY)
Admission: EM | Admit: 2015-03-10 | Discharge: 2015-03-10 | Disposition: A | Discharge status: Home / Self Care | Payer: Medicaid Other | Attending: Emergency Medicine | Admitting: Emergency Medicine

## 2015-03-10 DIAGNOSIS — M7989 Other specified soft tissue disorders: Secondary | ICD-10-CM | POA: Insufficient documentation

## 2015-03-10 DIAGNOSIS — M25522 Pain in left elbow: Secondary | ICD-10-CM | POA: Insufficient documentation

## 2015-03-10 DIAGNOSIS — Z862 Personal history of diseases of the blood and blood-forming organs and certain disorders involving the immune mechanism: Secondary | ICD-10-CM | POA: Insufficient documentation

## 2015-03-10 DIAGNOSIS — Z720 Tobacco use: Secondary | ICD-10-CM | POA: Insufficient documentation

## 2015-03-10 DIAGNOSIS — Z7951 Long term (current) use of inhaled steroids: Secondary | ICD-10-CM | POA: Insufficient documentation

## 2015-03-10 DIAGNOSIS — F419 Anxiety disorder, unspecified: Secondary | ICD-10-CM | POA: Insufficient documentation

## 2015-03-10 DIAGNOSIS — Z8742 Personal history of other diseases of the female genital tract: Secondary | ICD-10-CM | POA: Insufficient documentation

## 2015-03-10 DIAGNOSIS — Z79899 Other long term (current) drug therapy: Secondary | ICD-10-CM | POA: Insufficient documentation

## 2015-03-10 DIAGNOSIS — F329 Major depressive disorder, single episode, unspecified: Secondary | ICD-10-CM | POA: Insufficient documentation

## 2015-03-10 DIAGNOSIS — F909 Attention-deficit hyperactivity disorder, unspecified type: Secondary | ICD-10-CM | POA: Insufficient documentation

## 2015-03-10 MED ORDER — IBUPROFEN 600 MG PO TABS: 600.0000 mg | ORAL_TABLET | Freq: Four times a day (QID) | ORAL | Status: DC | PRN

## 2015-03-10 MED ORDER — IBUPROFEN 400 MG PO TABS
800.0000 mg | ORAL_TABLET | Freq: Once | ORAL | Status: AC
  Administered 2015-03-10: 800 mg via ORAL
  Filled 2015-03-10: qty 2

## 2015-03-10 NOTE — Discharge Instructions (Signed)
Read the information below.  Use the prescribed medication as directed.  Please discuss all new medications with your pharmacist.  You may return to the Emergency Department at any time for worsening condition or any new symptoms that concern you.   If there is any possibility that you might be pregnant, please let your health care provider know and discuss this with the pharmacist to ensure medication safety.  If you develop uncontrolled pain, weakness or numbness of the extremity, severe discoloration of the skin, or you are unable to move your elbow, return to the ER for a recheck.      Arthralgia Your caregiver has diagnosed you as suffering from an arthralgia. Arthralgia means there is pain in a joint. This can come from many reasons including:  Bruising the joint which causes soreness (inflammation) in the joint.  Wear and tear on the joints which occur as we grow older (osteoarthritis).  Overusing the joint.  Various forms of arthritis.  Infections of the joint. Regardless of the cause of pain in your joint, most of these different pains respond to anti-inflammatory drugs and rest. The exception to this is when a joint is infected, and these cases are treated with antibiotics, if it is a bacterial infection. HOME CARE INSTRUCTIONS   Rest the injured area for as long as directed by your caregiver. Then slowly start using the joint as directed by your caregiver and as the pain allows. Crutches as directed may be useful if the ankles, knees or hips are involved. If the knee was splinted or casted, continue use and care as directed. If an stretchy or elastic wrapping bandage has been applied today, it should be removed and re-applied every 3 to 4 hours. It should not be applied tightly, but firmly enough to keep swelling down. Watch toes and feet for swelling, bluish discoloration, coldness, numbness or excessive pain. If any of these problems (symptoms) occur, remove the ace bandage and  re-apply more loosely. If these symptoms persist, contact your caregiver or return to this location.  For the first 24 hours, keep the injured extremity elevated on pillows while lying down.  Apply ice for 15-20 minutes to the sore joint every couple hours while awake for the first half day. Then 03-04 times per day for the first 48 hours. Put the ice in a plastic bag and place a towel between the bag of ice and your skin.  Wear any splinting, casting, elastic bandage applications, or slings as instructed.  Only take over-the-counter or prescription medicines for pain, discomfort, or fever as directed by your caregiver. Do not use aspirin immediately after the injury unless instructed by your physician. Aspirin can cause increased bleeding and bruising of the tissues.  If you were given crutches, continue to use them as instructed and do not resume weight bearing on the sore joint until instructed. Persistent pain and inability to use the sore joint as directed for more than 2 to 3 days are warning signs indicating that you should see a caregiver for a follow-up visit as soon as possible. Initially, a hairline fracture (break in bone) may not be evident on X-rays. Persistent pain and swelling indicate that further evaluation, non-weight bearing or use of the joint (use of crutches or slings as instructed), or further X-rays are indicated. X-rays may sometimes not show a small fracture until a week or 10 days later. Make a follow-up appointment with your own caregiver or one to whom we have referred you. A  radiologist (specialist in reading X-rays) may read your X-rays. Make sure you know how you are to obtain your X-ray results. Do not assume everything is normal if you do not hear from [place]. SEEK MEDICAL CARE IF: Bruising, swelling, or pain increases. SEEK IMMEDIATE MEDICAL CARE IF:   Your fingers or toes are numb or blue.  The pain is not responding to medications and continues to stay the same or  get worse.  The pain in your joint becomes severe.  You develop a fever over 102 F (38.9 C).  It becomes impossible to move or use the joint. MAKE SURE YOU:   Understand these instructions.  Will watch your condition.  Will get help right away if you are not doing well or get worse. Document Released: 07/09/2005 Document Revised: 10/01/2011 Document Reviewed: 02/25/2008 [facility] Patient Information 2015 [place], [place]. This information is not intended to replace advice given to you by your health care provider. Make sure you discuss any questions you have with your health care provider.   Emergency Department Resource Guide 1) Find a Doctor and Pay Out of Pocket Although you won't have to find out who is covered by your insurance plan, it is a good idea to ask around and get recommendations. You will then need to call the office and see if the doctor you have chosen will accept you as a new patient and what types of options they offer for patients who are self-pay. Some doctors offer discounts or will set up payment plans for their patients who do not have insurance, but you will need to ask so you aren't surprised when you get to your appointment.  2) Contact Your Local Health Department Not all health departments have doctors that can see patients for sick visits, but many do, so it is worth a call to see if yours does. If you don't know where your local health department is, you can check in your phone book. The CDC also has a tool to help you locate your state's health department, and many state websites also have listings of all of their local health departments.  3) Find a Walk-in Clinic If your illness is not likely to be very severe or complicated, you may want to try a walk in clinic. These are popping up all over the country in pharmacies, drugstores, and shopping centers. They're usually staffed by nurse practitioners or physician assistants that have been trained to treat  common illnesses and complaints. They're usually fairly quick and inexpensive. However, if you have serious medical issues or chronic medical problems, these are probably not your best option.  No Primary Care Doctor: - Call Health Connect at  [phone] - they can help you locate a primary care doctor that  accepts your insurance, provides certain services, etc. - Physician Referral Service- [phone]  Chronic Pain Problems: Organization         Address  Phone   Notes  [facility]  [phone] Patients need to be referred by their primary care doctor.   Medication Assistance: Organization         Address  Phone   Notes  [facility] Med[facility] [address] [place], [place] [address] [phone] --Must be a resident of [facility] -- Must have NO insurance coverage whatsoever (no Medicaid/ Medicare, etc.) -- The pt. MUST have a primary care doctor that directs their care regularly and follows them in the community   MedAssist  (  [phone]   [employer]  [phone]    Agencies that provide inexpensive medical care: Organization         Address  Phone   Notes  [facility]  [phone]   [facility]    [phone]   [facility] [address] [place], [place] [address] [phone]   [facility] [address], [place] [phone]   Planned Parenthood    [phone]   [facility]    [phone]   [facility]  [address], [place] Phone:  [phone], Fax:  [phone] Hours of Operation:  9 am - 6 pm, M-F.  Also accepts Medicaid/Medicare and self-pay.  [facility]  [address],  Phone: [phone], Fax: [phone]. Hours of Operation:  8:30 am - 5:30 pm, M-F.  Also accepts Medicaid and self-pay.  [facility] [place] [address] [place] Phone: [phone]   [facility] [address] Natasha Bence [place], [place] [phone], Ext. [phone] Mondays & Thursdays: 7-9 AM.  First 15 patients are seen on a first come, first serve basis.    Medicaid-accepting [facility] Providers:  Organization         Address  Phone   Notes  [facility] [address],  [phone] Also accepts self-pay patients.  [facility] [address] Laure[address] Josephs [place], [place]  [phone]   [facility] [address], [place] [phone]   [facility] [address] [place] [phone]   Renaye Rakers [address], [place]   [phone] Only accepts [place] Access [place] patients after they have their name applied to their card.   Self-Pay (no insurance) in [facility]:  Organization         Address  Phone   Notes  Sickle Cell Patients, [facility] Internal Medicine [address] [place], [place] [phone]   [facility] Care [address] [place], [place] [phone]   [facility] [place]  [address], [place] [phone]   [facility]/Dr. Osei-Bonsu  [address] [place] or [address], [place] [phone] Phone number for both [place] and [place] locations is the same.  [facility] [address], [place] [phone]   [facility] [address] [place] or [address] [phone]   [facility] [address] [place] [phone], phone; [phone], fax Sees patients 1st and 3rd Saturday of every month.  Must not qualify for public or private insurance (i.e. Medicaid, Medicare, [place] Health Choice, Veterans' Benefits)  Household income should be no more than 200% of the  poverty level The clinic cannot treat you if you are pregnant or think you are pregnant  Sexually transmitted diseases are not treated at the clinic.    Dental Care: Organization         Address  Phone  Notes  [facility] Department of [facility] [address] [place], [place] [phone] Accepts children up to age 58 who are enrolled in [place] or [place] Health Choice; pregnant women  with a Medicaid card; and children who have applied for Medicaid or [place] Health Choice, but were declined, whose parents can pay a reduced fee at time of service.  [facility] Department of [facility]  [address], [place] [phone] Accepts children up to age 20 who are enrolled in [place] or [place] Health Choice; pregnant women with a Medicaid card; and children who have applied for Medicaid or [place] Health Choice, but were declined, whose parents can pay a reduced fee at time of service.  [facility]  [address] [place], [place] [phone] Patients are seen by appointment only. Walk-ins are not accepted. [facility] will see patients 70 years of age and older. Monday - Tuesday (8am-5pm) Most Wednesdays (8:30-5pm) $30 per visit, cash only  [facility]  [address], [facility] [phone] Patients are seen by appointment only. Walk-ins are not accepted. [facility] will see patients 7 years of age and older. One Wednesday Evening (Monthly: Volunteer Based).  $30 per visit, cash only  [employer]  [phone] for adults; Children under age 26, call Graduate Pediatric Dentistry at [phone]. Children aged 40-14, please call [phone] to request a pediatric application.  Dental services are provided in all areas of dental care including fillings, crowns and bridges, complete and partial dentures, implants, gum treatment, root canals, and extractions.  Preventive care is also provided. Treatment is provided to both adults and children. Patients are selected via a lottery and there is often a waiting list.   [facility] [address] [place]  [phone] [email or web address]   [facility] [address] [place], [place] [phone], Ext. [phone] Second and Fourth Thursday of each month, opens at 6:30 AM; Clinic ends at 9 AM.  Patients are seen on a first-come first-served basis, and a limited number are seen during each clinic.   [facility]  [address] Ether Griffins [place], [place] [phone]   Eligibility Requirements You must have lived in [place], [place], or [place] counties for at least the last three months.   You cannot be eligible for state or federal sponsored [place], including [employer], [place], or [employer].   You generally cannot be eligible for healthcare insurance through your employer.    How to apply: Eligibility screenings are held every Tuesday and Wednesday afternoon from 1:00 pm until 4:00 pm. You do not need an appointment for the interview!  [facility] [address], [place], [place] [phone]   [facility]  [phone]   [facility] Health Department  [phone]   [facility] Health Department  [phone]    Behavioral Health Resources in the Community: Intensive Outpatient Programs Organization         Address  Phone  Notes  [facility] [address] [place], [place] [phone]   [facility] Outpatient [address] [place], [place] [phone]   [facility] [address] [place], [place]  [phone]   [facility] Mental Health [address]  [place], [place] [phone] or [phone]   Substance Abuse Resources Organization         Address  Phone  Notes  [facility] Drug Services  [phone]   Addiction  [facility]  [phone]   [facility] [place]  [phone]   [facility]  [phone]   Residential & Outpatient Substance Abuse Program  [phone]   Psychological Services Organization         Address  Phone  Notes  [facility] Behavioral Health  [phone]   [facility]  [phone]   [facility] Health [address], [place] [phone] or [phone]    Mobile Crisis Teams Organization         Address  Phone  Notes  [facility], Mobile Crisis Care Unit  [phone]   [facility]  [address] [place], [place] [phone]   Doristine Locks [address] [place] [phone]    Self-Help/Support Groups Organization         Address  Phone             Notes  [facility] [place] - variety of support groups  336- I7437963 Call for more information  Narcotics Anonymous (NA), [facility] [address], [employer] [place]  2 meetings at this location   Statistician         Address  Phone  Notes  [facility] [address],    [place]  [phone]   [facility]  [address] [place] [address], [place], [place] [phone]   [facility] [place] [facility] [address] [place], [place] [phone] Admissions: 8am-3pm M-F  [facility] [address]    [place], [place] [phone]   [facility] [address] [place], [place], [place] [phone]   [facility] [address],  [place], [place] [phone]   [facility] - Intensive Outpatient [address] Laurell Joseph[address], [place], [place] [phone]   [facility] (Addiction [facility] [address] [place]  [place], [place] [phone] or [phone]   [facility] ([facility]) [address] [place], [place] [phone] Accepts Medicaid  [facility] [address],  [place]  [phone] Substance Abuse/Addiction Treatment   [facility] Organization         Address  Phone  Notes  [facility]  [phone]   Angie Fava, PhD [address] Ervin Knack [place], [place]   [phone] or [phone]   [facility] Behavioral   [address] [place], [place] [phone]   [facility] [address] [place], [place] [phone] Insurance/Medicaid/sponsorship through [facility] [address]                                    [place], [place] [phone] Therapy/tele-psych/case  [facility] [address], [place] [phone]    Dr. Lolly Mustache  [phone]   [facility]  [facility] Dept. 1) [address], [place] 2) [address], [place] 3)  [address], [place] [phone]  [phone]   [facility] Child Abuse Hotline [phone] or [phone] (After Hours)

## 2015-03-10 NOTE — ED Provider Notes (Signed)
CSN: [id number]     Arrival date & time 03/10/15  0813 History   First MD Initiated Contact with Patient 03/10/15 [phone]     Chief Complaint  Patient presents with  . Joint Swelling     (Consider location/radiation/quality/duration/timing/severity/associated sxs/prior Treatment) The history is provided by the patient.     Right handed pt p/w left elbow pain that began yesterday and worsened overnight.  The pain is located primarily over her lateral elbow, worse with certain movements and positions.  Pain is aching/throbbing/sharp.  Associated swelling. Has taken no medications for pain, nothing alleviates the pain.  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  She also recently moved and did heavy lifting, though not in the past few days.  Denies fevers, recent illness, recent treatment of infections or antibiotics, STD treatment or symptoms, weakness or numbness of the hand/arm, any possibility of injury or falls.  Her mother has hx gout but the pt does not.  Denies any other joints hurting.    Past Medical History  Diagnosis Date  . ADHD (attention deficit hyperactivity disorder)   . Anxiety   . Depression   . Substance abuse   . Anemia     iron def  . Allergy   . Dysmenorrhea   . BV (bacterial vaginosis)     recurrent-uses boric acid   Past Surgical History  Procedure Laterality Date  . Hernia repair     History reviewed. No pertinent family history. Social History  Substance Use Topics  . Smoking status: Current Every Day Smoker  . Smokeless tobacco: Never Used  . Alcohol Use: Yes     Comment: 40 onces of beer a night   OB History    Gravida Para Term Preterm AB TAB SAB Ectopic Multiple Living   3 2 2  1  1   2      Review of Systems  All other systems reviewed and are negative.     Allergies  Review of patient's allergies indicates no known allergies.  Home Medications   Prior to Admission medications   Medication Sig Start Date End  Date Taking? Authorizing Provider  buPROPion (WELLBUTRIN XL) 300 MG 24 hr tablet Take 1 tablet (300 mg total) by mouth daily. 03/31/14   Latrelle Dodrill, MD  cetirizine (ZYRTEC) 10 MG tablet take 1 tablet by mouth once daily 10/12/13   Latrelle Dodrill, MD  fluconazole (DIFLUCAN) 150 MG tablet Take 1 tablet (150 mg total) by mouth once. Repeat in 3 days if not better. 02/10/14   Latrelle Dodrill, MD  fluticasone (FLONASE) 50 MCG/ACT nasal spray Place 2 sprays into both nostrils daily. 11/08/13   Servando Salina, NP  HYDROcodone-acetaminophen (NORCO/VICODIN) 5-325 MG per tablet 1 to 2 tabs every 4 to 6 hours as needed for pain. 07/21/14   Reuben Likes, MD  ibuprofen (ADVIL,MOTRIN) 600 MG tablet Take 1 tablet (600 mg total) by mouth every 6 (six) hours as needed for mild pain or moderate pain. 03/10/15   Trixie Dredge, PA-C  ranitidine (ZANTAC) 150 MG tablet Take 1 tablet (150 mg total) by mouth 2 (two) times daily. 03/31/14   Latrelle Dodrill, MD  valACYclovir (VALTREX) 500 MG tablet Take 1 tab PO BID x 3 days, then take 1 tab PO daily thereafter 05/12/14   Latrelle Dodrill, MD   BP 124/82 mmHg  Pulse 73  Temp(Src) 97.4 F (36.3 C) (Oral)  Resp 20  SpO2  100%  LMP 01/31/2015 Physical Exam  Constitutional: She appears well-developed and well-nourished. No distress.  HENT:  Head: Normocephalic and atraumatic.  Neck: Neck supple.  Pulmonary/Chest: Effort normal.  Musculoskeletal:       Left shoulder: Normal.       Left elbow: She exhibits decreased range of motion (slight decrease in ROM.  Pt able to full flex and extends slightly less than full extension ) and swelling. She exhibits no effusion, no deformity and no laceration. Tenderness found. Lateral epicondyle tenderness noted.       Left wrist: Normal.       Left upper arm: Normal.       Left forearm: Normal.       Left hand: Normal.  Very small area of swelling over lateral proximal forearm, nontender.  No erythema, edema,  warmth, discharge, or tenderness of this area.   Neurological: She is alert.  Skin: She is not diaphoretic.  Nursing note and vitals reviewed.   ED Course  Procedures (including critical care time) Labs Review Labs Reviewed - No data to display  Imaging Review No results found. I have personally reviewed and evaluated these images and lab results as part of my medical decision-making.   EKG Interpretation None      MDM   Final diagnoses:  Left elbow pain    Afebrile, nontoxic patient with left elbow pain that began yesterday.  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  Doubt gout or septic joint.  Pt denies possibility of trauma.  Doubt fracture.  Compartments are soft, no compartment syndrome. Likely tendonitis from overuse.  Ace wrap, RICE, ibuprofen.   D/C home with ibuprofen, PCP follow up.  Discussed result, findings, treatment, and follow up  with patient.  Pt given return precautions.  Pt verbalizes understanding and agrees with plan.         Trixie Dredge, PA-C 03/10/15 4098  Raeford Razor, MD 03/11/15 [phone]

## 2015-03-10 NOTE — ED Notes (Signed)
Declined W/C at D/C and was escorted to lobby by RN. 

## 2015-03-10 NOTE — ED Notes (Signed)
Pt reports LT elbow pain started on WED. Pt has limited mobility to Lt elbow and swelling. Pt denies any injury.

## 2015-06-09 ENCOUNTER — Encounter: Payer: Medicaid Other | Admitting: Family Medicine

## 2015-06-13 ENCOUNTER — Encounter: Payer: Medicaid Other | Admitting: Family Medicine

## 2015-06-15 ENCOUNTER — Encounter: Payer: Medicaid Other | Admitting: Family Medicine

## 2015-07-07 ENCOUNTER — Ambulatory Visit: Payer: Medicaid Other

## 2015-07-12 ENCOUNTER — Telehealth: Payer: Self-pay | Admitting: Family Medicine

## 2015-07-12 NOTE — Telephone Encounter (Signed)
Called patient to reschedule the appt that she missed on 07/07/15. No answer and no option to LVM. Sadie Reynolds, ASA

## 2015-07-14 ENCOUNTER — Encounter: Payer: Medicaid Other | Admitting: Family Medicine

## 2015-09-02 ENCOUNTER — Ambulatory Visit (INDEPENDENT_AMBULATORY_CARE_PROVIDER_SITE_OTHER): Payer: Medicaid Other | Admitting: *Deleted

## 2015-09-02 DIAGNOSIS — Z23 Encounter for immunization: Secondary | ICD-10-CM

## 2015-10-28 ENCOUNTER — Encounter: Payer: Medicaid Other | Admitting: Family Medicine

## 2016-07-23 NOTE — L&D Delivery Note (Signed)
Patient is 41 y.o. [id number] [id number] admitted IOL. S/p IOL with foley bulb, cytotec, followed by Pitocin.   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  Delivery Note At 4:17 AM a viable female was delivered via Vaginal, Spontaneous Delivery (Presentation: LOA;  ).  APGAR: 9, 10; weight Pending .   Placenta status:intact delivered spontaneously  Cord: 2 vessel with no complications  Anesthesia: Epidural Episiotomy: None Lacerations: 1deg perineal. heomostatic Suture Repair: none Est. Blood Loss (mL): 100  Upon arrival patient was complete and pushing. She pushed with good maternal effort to deliver a viable female infant in cephalic, LOA position. No nuchal cord present. Baby delivered without difficulty, was noted to have good tone and place on maternal abdomen for oral suctioning, drying and stimulation. Delayed cord clamping performed. Placenta delivered spontaneously with gentle cord traction. Fundus firm with massage and Pitocin. Perineum inspected and found to have small hemostatic 1 degree perineal laceration, which was found to be hemostatic Counts of sharps, instruments, and lap pads were all correct.  Mom to postpartum.  Baby to Couplet care / Skin to Skin.  Denyse AmassCorey P Makaiah Terwilliger 02/23/2017, 5:04 AM

## 2016-08-16 NOTE — Progress Notes (Deleted)
   Subjective:    Patient ID: Yesenia Weeks , female   DOB: Jun 18, 1976 , 41 y.o..   MRN: [id number]  HPI  Ichelle Jerilee FieldBoykin is here for No chief complaint on file.  .     Review of Systems: Per HPI. All other systems reviewed and are negative.  Health Maintenance Due  Topic Date Due  . TETANUS/TDAP  11/21/2015  . INFLUENZA VACCINE  02/21/2016  . PAP SMEAR  07/29/2016    Past Medical History: Patient Active Problem List   Diagnosis Date Noted  . Dyspnea 03/31/2014  . Family history of breast cancer 03/31/2014  . Birth control 03/03/2014  . Sexual dysfunction 01/26/2014  . Genital herpes 10/26/2013  . Foot pain, bilateral 06/16/2013  . Muscle cramps 12/27/2011  . Breast pain 12/27/2011  . Contraception management 05/30/2011  . Vaginitis and vulvovaginitis 11/15/2010  . VAGINITIS, BACTERIAL, RECURRENT 05/30/2010  . TOBACCO USER 04/02/2009  . ACNE, MILD 09/10/2008  . ASTEATOTIC ECZEMA 09/10/2008  . DYSMENORRHEA 01/23/2007  . HALLUX VALGUS, ACQUIRED 11/06/2006  . ANEMIA, IRON DEFICIENCY, UNSPEC. 09/19/2006  . DEPRESSION, MAJOR, RECURRENT 09/19/2006  . RHINITIS, ALLERGIC 09/19/2006    Medications: reviewed and updated Current Outpatient Prescriptions  Medication Sig Dispense Refill  . buPROPion (WELLBUTRIN XL) 300 MG 24 hr tablet Take 1 tablet (300 mg total) by mouth daily. 30 tablet 0  . cetirizine (ZYRTEC) 10 MG tablet take 1 tablet by mouth once daily 30 tablet 5  . fluconazole (DIFLUCAN) 150 MG tablet Take 1 tablet (150 mg total) by mouth once. Repeat in 3 days if not better. 2 tablet 0  . fluticasone (FLONASE) 50 MCG/ACT nasal spray Place 2 sprays into both nostrils daily. 16 g 2  . HYDROcodone-acetaminophen (NORCO/VICODIN) 5-325 MG per tablet 1 to 2 tabs every 4 to 6 hours as needed for pain. 20 tablet 0  . ibuprofen (ADVIL,MOTRIN) 600 MG tablet Take 1 tablet (600 mg total) by mouth every 6 (six) hours as needed for mild pain or moderate pain. 15 tablet 0  . ranitidine  (ZANTAC) 150 MG tablet Take 1 tablet (150 mg total) by mouth 2 (two) times daily. 60 tablet 1  . valACYclovir (VALTREX) 500 MG tablet Take 1 tab PO BID x 3 days, then take 1 tab PO daily thereafter 33 tablet 5   No current facility-administered medications for this visit.     Social Hx:  reports that she has been smoking.  She has never used smokeless tobacco.   Objective:   There were no vitals taken for this visit. Physical Exam  Gen: NAD, alert, cooperative with exam, well-appearing HEENT: NCAT, PERRL, clear conjunctiva, oropharynx clear, supple neck Cardiac: Regular rate and rhythm, normal S1/S2, no murmur, no edema, capillary refill brisk  Respiratory: Clear to auscultation bilaterally, no wheezes, non-labored breathing Gastrointestinal: soft, non tender, non distended, bowel sounds present Skin: no rashes, normal turgor  Neurological: no gross deficits.  Psych: good insight, normal mood and affect   Assessment & Plan:  No problem-specific Assessment & Plan notes found for this encounter.   Anders Simmondshristina Fahmida Jurich, MD [facility], PGY-2

## 2016-08-17 ENCOUNTER — Inpatient Hospital Stay (HOSPITAL_COMMUNITY)
Admission: AD | Admit: 2016-08-17 | Discharge: 2016-08-17 | Disposition: A | Discharge status: Home / Self Care | Payer: Medicaid Other | Source: Ambulatory Visit | Attending: Obstetrics & Gynecology | Admitting: Obstetrics & Gynecology

## 2016-08-17 ENCOUNTER — Inpatient Hospital Stay (HOSPITAL_COMMUNITY): Payer: Medicaid Other

## 2016-08-17 ENCOUNTER — Ambulatory Visit: Payer: Medicaid Other | Admitting: Family Medicine

## 2016-08-17 ENCOUNTER — Encounter (HOSPITAL_COMMUNITY): Payer: Self-pay | Admitting: *Deleted

## 2016-08-17 ENCOUNTER — Ambulatory Visit (HOSPITAL_COMMUNITY)
Admission: EM | Admit: 2016-08-17 | Discharge: 2016-08-17 | Discharge status: Against Medical Advice | Payer: Medicaid Other

## 2016-08-17 DIAGNOSIS — O99322 Drug use complicating pregnancy, second trimester: Secondary | ICD-10-CM | POA: Insufficient documentation

## 2016-08-17 DIAGNOSIS — O9989 Other specified diseases and conditions complicating pregnancy, childbirth and the puerperium: Secondary | ICD-10-CM | POA: Diagnosis not present

## 2016-08-17 DIAGNOSIS — O26891 Other specified pregnancy related conditions, first trimester: Secondary | ICD-10-CM | POA: Insufficient documentation

## 2016-08-17 DIAGNOSIS — R102 Pelvic and perineal pain: Secondary | ICD-10-CM | POA: Insufficient documentation

## 2016-08-17 DIAGNOSIS — Z331 Pregnant state, incidental: Secondary | ICD-10-CM

## 2016-08-17 DIAGNOSIS — D509 Iron deficiency anemia, unspecified: Secondary | ICD-10-CM

## 2016-08-17 DIAGNOSIS — O99019 Anemia complicating pregnancy, unspecified trimester: Secondary | ICD-10-CM

## 2016-08-17 DIAGNOSIS — F909 Attention-deficit hyperactivity disorder, unspecified type: Secondary | ICD-10-CM | POA: Insufficient documentation

## 2016-08-17 DIAGNOSIS — Z3A14 14 weeks gestation of pregnancy: Secondary | ICD-10-CM | POA: Diagnosis not present

## 2016-08-17 DIAGNOSIS — Z3A12 12 weeks gestation of pregnancy: Secondary | ICD-10-CM

## 2016-08-17 DIAGNOSIS — Z8619 Personal history of other infectious and parasitic diseases: Secondary | ICD-10-CM | POA: Diagnosis not present

## 2016-08-17 DIAGNOSIS — R109 Unspecified abdominal pain: Secondary | ICD-10-CM | POA: Diagnosis not present

## 2016-08-17 DIAGNOSIS — O26899 Other specified pregnancy related conditions, unspecified trimester: Secondary | ICD-10-CM

## 2016-08-17 DIAGNOSIS — F1290 Cannabis use, unspecified, uncomplicated: Secondary | ICD-10-CM | POA: Insufficient documentation

## 2016-08-17 DIAGNOSIS — O99342 Other mental disorders complicating pregnancy, second trimester: Secondary | ICD-10-CM | POA: Diagnosis not present

## 2016-08-17 DIAGNOSIS — Z3201 Encounter for pregnancy test, result positive: Secondary | ICD-10-CM | POA: Insufficient documentation

## 2016-08-17 DIAGNOSIS — Z87891 Personal history of nicotine dependence: Secondary | ICD-10-CM | POA: Diagnosis not present

## 2016-08-17 DIAGNOSIS — Z3491 Encounter for supervision of normal pregnancy, unspecified, first trimester: Secondary | ICD-10-CM

## 2016-08-17 LAB — CBC
HCT: 33.1 % — ABNORMAL LOW (ref 36.0–46.0)
Hemoglobin: 10.8 g/dL — ABNORMAL LOW (ref 12.0–15.0)
MCH: 27.2 pg (ref 26.0–34.0)
MCHC: 32.6 g/dL (ref 30.0–36.0)
MCV: 83.4 fL (ref 78.0–100.0)
Platelets: 206 10*3/uL (ref 150–400)
RBC: 3.97 MIL/uL (ref 3.87–5.11)
RDW: 13.1 % (ref 11.5–15.5)
WBC: 10.6 10*3/uL — ABNORMAL HIGH (ref 4.0–10.5)

## 2016-08-17 LAB — URINALYSIS, ROUTINE W REFLEX MICROSCOPIC
Bilirubin Urine: NEGATIVE
Glucose, UA: NEGATIVE mg/dL
Hgb urine dipstick: NEGATIVE
Ketones, ur: NEGATIVE mg/dL
Leukocytes, UA: NEGATIVE
Nitrite: NEGATIVE
Protein, ur: NEGATIVE mg/dL
Specific Gravity, Urine: 1.024 (ref 1.005–1.030)
pH: 6 (ref 5.0–8.0)

## 2016-08-17 LAB — WET PREP, GENITAL
Clue Cells Wet Prep HPF POC: NONE SEEN
Sperm: NONE SEEN
Trich, Wet Prep: NONE SEEN
Yeast Wet Prep HPF POC: NONE SEEN

## 2016-08-17 LAB — HCG, QUANTITATIVE, PREGNANCY: hCG, Beta Chain, Quant, S: 26924 m[IU]/mL — ABNORMAL HIGH (ref <5)

## 2016-08-17 LAB — POCT PREGNANCY, URINE: Preg Test, Ur: POSITIVE — AB

## 2016-08-17 LAB — ABO/RH: ABO/RH(D): A POS
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
## 2016-08-17 NOTE — ED Notes (Signed)
Pt was instructed to go to [facility] due to the fact she's pregnant and her age. She said she was unable to get a ride so I informed her [place] GraterMaggie would drive her. She said she would let us know.

## 2016-08-17 NOTE — Discharge Instructions (Signed)

## 2016-08-17 NOTE — MAU Provider Note (Signed)
History     CSN: [id number]  Arrival date and time: 08/17/16 1500   First Provider Initiated Contact with Patient 08/17/16 1611      Chief Complaint  Patient presents with  . Abdominal Pain   G4P2012 @[redacted]w[redacted]d  by LMP here with lower abdominal cramping. She reports pain started one month ago. She has not taken anything for it. Denies alleviating or aggrevating factors. She denies VB and vaginal discharge. No fevers. She reports +HPT 2 days ago. She is concerned about how far along she is since her LMP only lasted one day. She was using tobacco, alcohol, and marijuana until she found out she was pregnant 2 days ago.     OB History    Gravida Para Term Preterm AB Living   4 2 2   1 2    SAB TAB Ectopic Multiple Live Births   1              Past Medical History:  Diagnosis Date  . ADHD (attention deficit hyperactivity disorder)   . Allergy   . Anemia    iron def  . Anxiety   . BV (bacterial vaginosis)    recurrent-uses boric acid  . Depression   . Dysmenorrhea   . Substance abuse     Past Surgical History:  Procedure Laterality Date  . HERNIA REPAIR      History reviewed. No pertinent family history.  Social History  Substance Use Topics  . Smoking status: Former Smoker    Types: Cigarettes    Quit date: 08/13/2016  . Smokeless tobacco: Never Used  . Alcohol use Yes     Comment: 40 onces of beer a night/stopped after +UPT    Allergies: No Known Allergies  Prescriptions Prior to Admission  Medication Sig Dispense Refill Last Dose  . buPROPion (WELLBUTRIN XL) 300 MG 24 hr tablet Take 1 tablet (300 mg total) by mouth daily. 30 tablet 0 Unknown at Unknown time  . cetirizine (ZYRTEC) 10 MG tablet take 1 tablet by mouth once daily 30 tablet 5 Unknown at Unknown time  . fluconazole (DIFLUCAN) 150 MG tablet Take 1 tablet (150 mg total) by mouth once. Repeat in 3 days if not better. 2 tablet 0 Unknown at Unknown time  . fluticasone (FLONASE) 50 MCG/ACT nasal spray Place 2  sprays into both nostrils daily. 16 g 2 Unknown at Unknown time  . HYDROcodone-acetaminophen (NORCO/VICODIN) 5-325 MG per tablet 1 to 2 tabs every 4 to 6 hours as needed for pain. 20 tablet 0   . ibuprofen (ADVIL,MOTRIN) 600 MG tablet Take 1 tablet (600 mg total) by mouth every 6 (six) hours as needed for mild pain or moderate pain. 15 tablet 0   . ranitidine (ZANTAC) 150 MG tablet Take 1 tablet (150 mg total) by mouth 2 (two) times daily. 60 tablet 1 Unknown at Unknown time  . valACYclovir (VALTREX) 500 MG tablet Take 1 tab PO BID x 3 days, then take 1 tab PO daily thereafter 33 tablet 5 Unknown at Unknown time    Review of Systems  Constitutional: Negative for chills and fever.  Gastrointestinal: Positive for abdominal pain and nausea. Negative for constipation, diarrhea and vomiting.  Genitourinary: Negative for vaginal bleeding and vaginal discharge.   Physical Exam   Blood pressure 120/77, pulse 86, resp. rate 18, height 5\' 7"  (1.702 m), weight 70.8 kg (156 lb 1.3 oz), last menstrual period 07/03/2016.  Physical Exam  Nursing note and vitals reviewed. Constitutional: She is oriented  to person, place, and time. She appears well-developed and well-nourished. No distress.  HENT:  Head: Normocephalic and atraumatic.  Neck: Normal range of motion.  Cardiovascular: Normal rate.   Respiratory: Effort normal.  GI: Soft. She exhibits no distension and no mass. There is no tenderness. There is no rebound and no guarding.  Genitourinary:  Genitourinary Comments: External: no lesions or erythema Vagina: rugated, parous, thin white discharge Uterus: non enlarged, anteverted, non tender, no CMT Adnexae: no masses, no tenderness left, no tenderness right   Musculoskeletal: Normal range of motion.  Neurological: She is alert and oriented to person, place, and time.  Skin: Skin is warm and dry.  Psychiatric: She has a normal mood and affect.   Results for orders placed or performed during the  hospital encounter of 08/17/16 (from the past 24 hour(s))  Urinalysis, Routine w reflex microscopic     Status: None   Collection Time: 08/17/16  3:13 PM  Result Value Ref Range   Color, Urine YELLOW YELLOW   APPearance CLEAR CLEAR   Specific Gravity, Urine 1.024 1.005 - 1.030   pH 6.0 5.0 - 8.0   Glucose, UA NEGATIVE NEGATIVE mg/dL   Hgb urine dipstick NEGATIVE NEGATIVE   Bilirubin Urine NEGATIVE NEGATIVE   Ketones, ur NEGATIVE NEGATIVE mg/dL   Protein, ur NEGATIVE NEGATIVE mg/dL   Nitrite NEGATIVE NEGATIVE   Leukocytes, UA NEGATIVE NEGATIVE  Pregnancy, urine POC     Status: Abnormal   Collection Time: 08/17/16  3:28 PM  Result Value Ref Range   Preg Test, Ur POSITIVE (A) NEGATIVE  Wet prep, genital     Status: Abnormal   Collection Time: 08/17/16  4:10 PM  Result Value Ref Range   Yeast Wet Prep HPF POC NONE SEEN NONE SEEN   Trich, Wet Prep NONE SEEN NONE SEEN   Clue Cells Wet Prep HPF POC NONE SEEN NONE SEEN   WBC, Wet Prep HPF POC FEW (A) NONE SEEN   Sperm NONE SEEN   ABO/Rh     Status: None (Preliminary result)   Collection Time: 08/17/16  4:41 PM  Result Value Ref Range   ABO/RH(D) A POS   CBC     Status: Abnormal   Collection Time: 08/17/16  4:44 PM  Result Value Ref Range   WBC 10.6 (H) 4.0 - 10.5 K/uL   RBC 3.97 3.87 - 5.11 MIL/uL   Hemoglobin 10.8 (L) 12.0 - 15.0 g/dL   HCT 60.433.1 (L) 54.036.0 - 98.146.0 %   MCV 83.4 78.0 - 100.0 fL   MCH 27.2 26.0 - 34.0 pg   MCHC 32.6 30.0 - 36.0 g/dL   RDW 19.113.1 47.811.5 - 29.515.5 %   Platelets 206 150 - 400 K/uL  hCG, quantitative, pregnancy     Status: Abnormal   Collection Time: 08/17/16  4:45 PM  Result Value Ref Range   hCG, Beta Chain, Quant, S 26,924 (H) <5 mIU/mL   [place] Ob Comp Less 14 Wks  Result Date: 08/17/2016 CLINICAL DATA:  Chronic abdominal cramping.  Initial encounter. EXAM: OBSTETRIC <14 WK ULTRASOUND TECHNIQUE: Transabdominal ultrasound was performed for evaluation of the gestation as well as the maternal uterus and  adnexal regions. COMPARISON:  None. FINDINGS: Intrauterine gestational sac: Single; visualized and normal in shape. Yolk sac:  No Embryo:  Yes Cardiac Activity: Yes Heart Rate: 165 bpm CRL:   5.48 cm   12 w 0 d  [place] EDC: 03/01/2017 Subchorionic hemorrhage:  None visualized. Maternal uterus/adnexae: The uterus is otherwise unremarkable in appearance. The ovaries are within normal limits. The right ovary measures 3.3 x 1.7 x 1.4 cm, while the left ovary measures 2.5 x 1.3 x 1.8 cm. No suspicious adnexal masses are seen; there is no evidence for ovarian torsion. No free fluid is seen within the pelvic cul-de-sac. IMPRESSION: Single live intrauterine pregnancy noted, with a crown-rump length of 5.5 cm, corresponding to a gestational age of [redacted] weeks 0 days. This does not match the gestational age by LMP, and reflects a new estimated date of delivery of March 01, 2017. Electronically Signed   By: Roanna Raider M.D.   On: 08/17/2016 17:46    MAU Course  Procedures  MDM Labs and [place] ordered and reviewed. Normal IUP at [redacted]w[redacted]d, inconsistent with LMP. No evidence of UTI or pelvic infection. Cramping likely physiologic to early pregnancy. Stable for discharge home.  Assessment and Plan   1. [redacted] weeks gestation of pregnancy   2. Cramping complicating pregnancy, antepartum   3. Normal intrauterine pregnancy on prenatal ultrasound in first trimester   4.      IDA of pregnancy  Discharge home Start PNV w/Fe 1 po daily Follow [facility] at [facility] to [facility] care Return to MAU for OB emergencies  Allergies as of 08/17/2016   No Known Allergies     Medication List    STOP taking these medications   buPROPion 300 MG 24 hr tablet Commonly known as:  WELLBUTRIN XL   cetirizine 10 MG tablet Commonly known as:  ZYRTEC   fluconazole 150 MG tablet Commonly known as:  DIFLUCAN   fluticasone 50 MCG/ACT nasal spray Commonly known as:  FLONASE   HYDROcodone-acetaminophen 5-325 MG tablet Commonly known  as:  NORCO/VICODIN   ibuprofen 600 MG tablet Commonly known as:  ADVIL,MOTRIN   ranitidine 150 MG tablet Commonly known as:  ZANTAC   valACYclovir 500 MG tablet Commonly known as:  Donnamae Jude, CNM 08/17/2016, 4:27 PM

## 2016-08-17 NOTE — MAU Note (Signed)
Had positive HPT this week. Went to Urgent care and was advised to come here. Pt c/o abd cramping x 1 month. LMP 07/03/16 bled for one day only and had a period month  Before and it was only 2 days in length.

## 2016-08-21 LAB — GC/CHLAMYDIA PROBE AMP (~~LOC~~) NOT AT ARMC
Chlamydia: NEGATIVE
Neisseria Gonorrhea: NEGATIVE

## 2016-08-23 ENCOUNTER — Encounter: Payer: Medicaid Other | Admitting: Obstetrics and Gynecology

## 2016-08-31 ENCOUNTER — Ambulatory Visit (INDEPENDENT_AMBULATORY_CARE_PROVIDER_SITE_OTHER): Payer: Medicaid Other | Admitting: Family Medicine

## 2016-08-31 ENCOUNTER — Encounter: Payer: Self-pay | Admitting: Psychology

## 2016-08-31 VITALS — BP 104/60 | HR 85 | Temp 98.2°F | Wt 161.0 lb

## 2016-08-31 DIAGNOSIS — F411 Generalized anxiety disorder: Secondary | ICD-10-CM

## 2016-08-31 DIAGNOSIS — Z3402 Encounter for supervision of normal first pregnancy, second trimester: Secondary | ICD-10-CM | POA: Diagnosis not present

## 2016-08-31 DIAGNOSIS — Z3A13 13 weeks gestation of pregnancy: Secondary | ICD-10-CM

## 2016-08-31 DIAGNOSIS — F39 Unspecified mood [affective] disorder: Secondary | ICD-10-CM

## 2016-08-31 LAB — POCT URINALYSIS DIPSTICK
Bilirubin, UA: NEGATIVE
Blood, UA: NEGATIVE
Glucose, UA: NEGATIVE
Ketones, UA: NEGATIVE
Leukocytes, UA: NEGATIVE
Nitrite, UA: NEGATIVE
Protein, UA: NEGATIVE
Spec Grav, UA: 1.02
Urobilinogen, UA: 0.2
pH, UA: 6.5

## 2016-08-31 LAB — OB RESULTS CONSOLE RUBELLA ANTIBODY, IGM: Rubella: IMMUNE

## 2016-08-31 NOTE — Patient Instructions (Signed)
Please schedule a follow-up for:  February 23rd at 10:00.  We talked about diaphragmatic (belly) breathing and mindfulness.  They take practice in order to get really good.  You said you have some time on your hands right now so there you have it.  Practicing daily, maybe a few times a day, I'll bet you would notice a difference.  Please watch this video on mindfulness:  [email or web address].[email or web address]/[email or web address].  You can also just google Mindfulness Superpower if you want to find it.  I'll give you a handout on belly breathing.  The App CALM can be helpful if you can find it online or borrow someone's smart phone or tablet.
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
## 2016-08-31 NOTE — Progress Notes (Signed)
Yesenia Weeks is a 41 y.o. yo [id number] at 5153w0d who presents for her initial prenatal visit. Pregnancy  is not planned. Initially thought she was going through menopause until she was encouraged by family to check a pregnancy test. Unsure when her last menstrual period was, but believes it was sometime in December. Reports she has a good support system at home with daughters (40yo and 14yo) and long term boyfriend. History of three previous pregnancies. First pregnancy was delivered at 5months and did not survive; she was positive for Gonorrhea and Chlamydia at that time and said she lived a high risk lifestyle. Her second and third pregnancies were both managed by high risk clinic, she reports due to high stress; both were delivered vaginally at term without any complications. Notes family history of sickle cell trait in an aunt; she has never been tested. Surgical history includes umbilical hernia surgery. Notes history of Gonorrhea, Chlamydia, and Herpes. Denies any medications except for prenatal vitamins. History of alcohol, tobacco, marijuana, and cocaine use, all of which were stopped cold [place] after finding out she was pregnant. She reports breast tenderness, fatigue, nausea and positive home pregnancy test. Denies vaginal bleeding or discharge, contractions, loss of fluid. Has not yet noted fetal movement.  She  is taking PNV. See flow sheet for details.  PMH, POBH, FH, meds, allergies and Social Hx reviewed.  Prenatal exam:Gen: Well nourished, well developed.  No distress.  Vitals noted. HEENT: Normocephalic, atraumatic.  Neck supple without cervical lymphadenopathy, thyromegaly or thyroid nodules.  fair dentition. CV: RRR no murmur, gallops or rubs Lungs: CTA B.  Normal respiratory effort without wheezes or rales. Abd: soft, NTND. +BS.  Uterus not appreciated above pelvis. GU: Normal external female genitalia without lesions.  Nl vaginal, well rugated without lesions. No vaginal discharge.   Bimanual exam: No adnexal mass or TTP. No CMT.  Ext: No clubbing, cyanosis or edema. Psych: Disheveled.  Anxious appearing.    Assessment/plan: 1) Pregnancy 5453w0d doing well.  Current pregnancy issues include anxiety (stressors include drug and alcohol use before she knew she was pregnant and worried it hurt the baby, advanced maternal age) and history of STD (Gonorrhea, Chlamydia, Herpes). [facility] to discuss coping mechanisms. Will need Herpes prophylaxis prior to delivery. Dating is reliable Prenatal labs not obtained prior to visit. Obstetric panel, sickle cells screen, HIV antibody, UA, and Urine Culture obtained. Bleeding and pain precautions reviewed. Importance of prenatal vitamins reviewed.  Genetic screening offered. Would like quad screen when appropriate. Referral to High Risk Clinic due to advanced maternal age. States last two pregnancies were also managed by [facility].

## 2016-08-31 NOTE — Patient Instructions (Signed)
Thank you so much for coming to visit today! You are doing a great job already caring for your little one! Please continue your prenatal vitamins. We will check several labs today we check in all of our first OB patients. We will place a referral to high risk OB, so let us know if you do not hear from them soon! We will have your Quad screen done in approximately two-three weeks.  Dr. Caroleen Hamman   First Trimester of Pregnancy The first trimester of pregnancy is from week 1 until the end of week 12 (months 1 through 3). A week after a sperm fertilizes an egg, the egg will implant on the wall of the uterus. This embryo will begin to develop into a baby. Genes from you and your partner are forming the baby. The female genes determine whether the baby is a boy or a girl. At 6-8 weeks, the eyes and face are formed, and the heartbeat can be seen on ultrasound. At the end of 12 weeks, all the baby's organs are formed.  Now that you are pregnant, you will want to do everything you can to have a healthy baby. Two of the most important things are to get good prenatal care and to follow your health care provider's instructions. Prenatal care is all the medical care you receive before the baby's birth. This care will help prevent, find, and treat any problems during the pregnancy and childbirth. BODY CHANGES Your body goes through many changes during pregnancy. The changes vary from woman to woman.   You may gain or lose a couple of pounds at first.  You may feel sick to your stomach (nauseous) and throw up (vomit). If the vomiting is uncontrollable, call your health care provider.  You may tire easily.  You may develop headaches that can be relieved by medicines approved by your health care provider.  You may urinate more often. Painful urination may mean you have a bladder infection.  You may develop heartburn as a result of your pregnancy.  You may develop constipation because certain hormones are causing  the muscles that push waste through your intestines to slow down.  You may develop hemorrhoids or swollen, bulging veins (varicose veins).  Your breasts may begin to grow larger and become tender. Your nipples may stick out more, and the tissue that surrounds them (areola) may become darker.  Your gums may bleed and may be sensitive to brushing and flossing.  Dark spots or blotches (chloasma, mask of pregnancy) may develop on your face. This will likely fade after the baby is born.  Your menstrual periods will stop.  You may have a loss of appetite.  You may develop cravings for certain kinds of food.  You may have changes in your emotions from day to day, such as being excited to be pregnant or being concerned that something may go wrong with the pregnancy and baby.  You may have more vivid and strange dreams.  You may have changes in your hair. These can include thickening of your hair, rapid growth, and changes in texture. Some women also have hair loss during or after pregnancy, or hair that feels dry or thin. Your hair will most likely return to normal after your baby is born. WHAT TO EXPECT AT YOUR PRENATAL VISITS During a routine prenatal visit:  You will be weighed to make sure you and the baby are growing normally.  Your blood pressure will be taken.  Your abdomen will be measured  to track your baby's growth.  The fetal heartbeat will be listened to starting around week 10 or 12 of your pregnancy.  Test results from any previous visits will be discussed. Your health care provider may ask you:  How you are feeling.  If you are feeling the baby move.  If you have had any abnormal symptoms, such as leaking fluid, bleeding, severe headaches, or abdominal cramping.  If you are using any tobacco products, including cigarettes, chewing tobacco, and electronic cigarettes.  If you have any questions. Other tests that may be performed during your first trimester  include:  Blood tests to find your blood type and to check for the presence of any previous infections. They will also be used to check for low iron levels (anemia) and Rh antibodies. Later in the pregnancy, blood tests for diabetes will be done along with other tests if problems develop.  Urine tests to check for infections, diabetes, or protein in the urine.  An ultrasound to confirm the proper growth and development of the baby.  An amniocentesis to check for possible genetic problems.  Fetal screens for spina bifida and Down syndrome.  You may need other tests to make sure you and the baby are doing well.  HIV (human immunodeficiency virus) testing. Routine prenatal testing includes screening for HIV, unless you choose not to have this test. HOME CARE INSTRUCTIONS  Medicines   Follow your health care provider's instructions regarding medicine use. Specific medicines may be either safe or unsafe to take during pregnancy.  Take your prenatal vitamins as directed.  If you develop constipation, try taking a stool softener if your health care provider approves. Diet   Eat regular, well-balanced meals. Choose a variety of foods, such as meat or vegetable-based protein, fish, milk and low-fat dairy products, vegetables, fruits, and whole grain breads and cereals. Your health care provider will help you determine the amount of weight gain that is right for you.  Avoid raw meat and uncooked cheese. These carry germs that can cause birth defects in the baby.  Eating four or five small meals rather than three large meals a day may help relieve nausea and vomiting. If you start to feel nauseous, eating a few soda crackers can be helpful. Drinking liquids between meals instead of during meals also seems to help nausea and vomiting.  If you develop constipation, eat more high-fiber foods, such as fresh vegetables or fruit and whole grains. Drink enough fluids to keep your urine clear or pale  yellow. Activity and Exercise   Exercise only as directed by your health care provider. Exercising will help you:  Control your weight.  Stay in shape.  Be prepared for labor and delivery.  Experiencing pain or cramping in the lower abdomen or low back is a good sign that you should stop exercising. Check with your health care provider before continuing normal exercises.  Try to avoid standing for long periods of time. Move your legs often if you must stand in one place for a long time.  Avoid heavy lifting.  Wear low-heeled shoes, and practice good posture.  You may continue to have sex unless your health care provider directs you otherwise. Relief of Pain or Discomfort   Wear a good support bra for breast tenderness.   Take warm sitz baths to soothe any pain or discomfort caused by hemorrhoids. Use hemorrhoid cream if your health care provider approves.   Rest with your legs elevated if you have leg cramps  or low back pain.  If you develop varicose veins in your legs, wear support hose. Elevate your feet for 15 minutes, 3-4 times a day. Limit salt in your diet. Prenatal Care   Schedule your prenatal visits by the twelfth week of pregnancy. They are usually scheduled monthly at first, then more often in the last 2 months before delivery.  Write down your questions. Take them to your prenatal visits.  Keep all your prenatal visits as directed by your health care provider. Safety   Wear your seat belt at all times when driving.  Make a list of emergency phone numbers, including numbers for family, friends, the hospital, and police and fire departments. General Tips   Ask your health care provider for a referral to a local prenatal education class. Begin classes no later than at the beginning of month 6 of your pregnancy.  Ask for help if you have counseling or nutritional needs during pregnancy. Your health care provider can offer advice or refer you to specialists for  help with various needs.  Do not use hot tubs, steam rooms, or saunas.  Do not douche or use tampons or scented sanitary pads.  Do not cross your legs for long periods of time.  Avoid cat litter boxes and soil used by cats. These carry germs that can cause birth defects in the baby and possibly loss of the fetus by miscarriage or stillbirth.  Avoid all smoking, herbs, alcohol, and medicines not prescribed by your health care provider. Chemicals in these affect the formation and growth of the baby.  Do not use any tobacco products, including cigarettes, chewing tobacco, and electronic cigarettes. If you need help quitting, ask your health care provider. You may receive counseling support and other resources to help you quit.  Schedule a dentist appointment. At home, brush your teeth with a soft toothbrush and be gentle when you floss. SEEK MEDICAL CARE IF:   You have dizziness.  You have mild pelvic cramps, pelvic pressure, or nagging pain in the abdominal area.  You have persistent nausea, vomiting, or diarrhea.  You have a bad smelling vaginal discharge.  You have pain with urination.  You notice increased swelling in your face, hands, legs, or ankles. SEEK IMMEDIATE MEDICAL CARE IF:   You have a fever.  You are leaking fluid from your vagina.  You have spotting or bleeding from your vagina.  You have severe abdominal cramping or pain.  You have rapid weight gain or loss.  You vomit blood or material that looks like coffee grounds.  You are exposed to [place] measles and have never had them.  You are exposed to fifth disease or chickenpox.  You develop a severe headache.  You have shortness of breath.  You have any kind of trauma, such as from a fall or a car accident. This information is not intended to replace advice given to you by your health care provider. Make sure you discuss any questions you have with your health care provider. Document Released:  07/03/2001 Document Revised: 07/30/2014 Document Reviewed: 05/19/2013 Elsevier Interactive Patient Education  2017 [employer]

## 2016-08-31 NOTE — Assessment & Plan Note (Signed)
Yesenia Weeks is a bit disheveled.  She volunteers that she has dirty clothes on and reports she wishes she had chosen different ones.  She maintains good eye contact and is cooperative and attentive.  Speech is normal in tone and rhythm with a moderately accelerated rate.  Mood is anxious with a consistent affect.  Thought process is logical and goal directed.  No evidence of SI or HI.  Does not appear to be responding to any internal stimuli.  Able to maintain train of thought and concentrate on the questions.  Judgment and insight are average.  With family history, substance abuse history, and personal report of some symptomatology today, I am not sure Unipolar Major Depression is the most appropriate diagnosis.  Will give an MDQ at subsequent visit and focus on managing anxiety and irritability via non pharmacologic ways for now.  See patient instructions for further plan.

## 2016-08-31 NOTE — Assessment & Plan Note (Signed)
GAD vs. PTSD vs. Panic or a combination.  She is also reacting to a recent stressor (unintended pregnancy).  Would like to get a better sense of symptomatology next visit.

## 2016-09-01 LAB — HIV ANTIBODY (ROUTINE TESTING W REFLEX): HIV 1&2 Ab, 4th Generation: NONREACTIVE

## 2016-09-01 LAB — CULTURE, OB URINE

## 2016-09-01 LAB — SICKLE CELL SCREEN: Sickle Cell Screen: NEGATIVE

## 2016-09-03 LAB — OBSTETRIC PANEL
Antibody Screen: NEGATIVE
Basophils Absolute: 0 cells/uL (ref 0–200)
Basophils Relative: 0 %
Eosinophils Absolute: 327 cells/uL (ref 15–500)
Eosinophils Relative: 3 %
HCT: 36.5 % (ref 35.0–45.0)
Hemoglobin: 12 g/dL (ref 11.7–15.5)
Hepatitis B Surface Ag: NEGATIVE
Lymphocytes Relative: 25 %
Lymphs Abs: 2725 cells/uL (ref 850–3900)
MCH: 27.3 pg (ref 27.0–33.0)
MCHC: 32.9 g/dL (ref 32.0–36.0)
MCV: 83 fL (ref 80.0–100.0)
MPV: 10.5 fL (ref 7.5–12.5)
Monocytes Absolute: 436 cells/uL (ref 200–950)
Monocytes Relative: 4 %
Neutro Abs: 7412 cells/uL (ref 1500–7800)
Neutrophils Relative %: 68 %
Platelets: 250 10*3/uL (ref 140–400)
RBC: 4.4 MIL/uL (ref 3.80–5.10)
RDW: 13.6 % (ref 11.0–15.0)
Rh Type: POSITIVE
Rubella: 5.64 Index — ABNORMAL HIGH (ref <0.90)
WBC: 10.9 10*3/uL — ABNORMAL HIGH (ref 3.8–10.8)

## 2016-09-04 ENCOUNTER — Encounter (HOSPITAL_COMMUNITY): Payer: Self-pay | Admitting: *Deleted

## 2016-09-04 ENCOUNTER — Inpatient Hospital Stay (HOSPITAL_COMMUNITY)
Admission: AD | Admit: 2016-09-04 | Discharge: 2016-09-04 | Disposition: A | Discharge status: Home / Self Care | Payer: Medicaid Other | Source: Ambulatory Visit | Attending: Obstetrics and Gynecology | Admitting: Obstetrics and Gynecology

## 2016-09-04 ENCOUNTER — Inpatient Hospital Stay (HOSPITAL_COMMUNITY): Payer: Medicaid Other

## 2016-09-04 DIAGNOSIS — O26852 Spotting complicating pregnancy, second trimester: Secondary | ICD-10-CM | POA: Diagnosis present

## 2016-09-04 DIAGNOSIS — O4692 Antepartum hemorrhage, unspecified, second trimester: Secondary | ICD-10-CM

## 2016-09-04 DIAGNOSIS — Z87891 Personal history of nicotine dependence: Secondary | ICD-10-CM | POA: Diagnosis not present

## 2016-09-04 DIAGNOSIS — Z3A14 14 weeks gestation of pregnancy: Secondary | ICD-10-CM | POA: Diagnosis not present

## 2016-09-04 DIAGNOSIS — N939 Abnormal uterine and vaginal bleeding, unspecified: Secondary | ICD-10-CM

## 2016-09-04 LAB — URINALYSIS, ROUTINE W REFLEX MICROSCOPIC
Bilirubin Urine: NEGATIVE
Glucose, UA: NEGATIVE mg/dL
Ketones, ur: NEGATIVE mg/dL
Leukocytes, UA: NEGATIVE
Nitrite: NEGATIVE
Protein, ur: NEGATIVE mg/dL
Specific Gravity, Urine: 1.003 — ABNORMAL LOW (ref 1.005–1.030)
pH: 6 (ref 5.0–8.0)

## 2016-09-04 LAB — WET PREP, GENITAL
Clue Cells Wet Prep HPF POC: NONE SEEN
Sperm: NONE SEEN
Trich, Wet Prep: NONE SEEN
Yeast Wet Prep HPF POC: NONE SEEN

## 2016-09-04 LAB — OB RESULTS CONSOLE GBS: GBS: POSITIVE

## 2016-09-04 NOTE — Discharge Instructions (Signed)
What Do I Need to Know About Injuries During Pregnancy? °Injuries can happen during pregnancy. Minor falls and accidents usually do not harm you or your baby. However, any injury should be reported to your doctor. °What can I do to protect myself from injuries? °· Remove rugs and loose objects on the floor. °· Wear comfortable shoes that have a good grip. Do not wear high-heeled shoes. °· Always wear your seat belt. The lap belt should be below your belly. Always practice safe driving. °· Do not ride on a motorcycle. °· Do not participate in high-impact activities or sports. °· Avoid: °¨ Walking on wet or slippery floors. °¨ Fires. °¨ Starting fires. °¨ Lifting heavy pots of boiling or hot liquids. °¨ Fixing electrical problems. °· Only take medicine as told by your doctor. °· Know your blood type and the blood type of the baby's father. °· Call your local emergency services (911 in the [place] if you are a victim of domestic violence or assault. For help and support, contact the National Domestic Violence Hotline. °Get help right away if: °· You fall on your belly or have any high-impact accident or injury. °· You have been a victim of domestic violence or any kind of violence. °· You have been in a car accident. °· You have bleeding from your vagina. °· Fluid is leaking from your vagina. °· You start to have belly cramping (contractions) or pain. °· You feel weak or pass out (faint). °· You start to throw up (vomit) after an injury. °· You have been burned. °· You have a stiff neck or neck pain. °· You get a headache or have vision problems after an injury. °· You do not feel the baby move or the baby is not moving as much as normal. °This information is not intended to replace advice given to you by your health care provider. Make sure you discuss any questions you have with your health care provider. °Document Released: 08/11/2010 Document Revised: 12/15/2015 Document Reviewed: 04/15/2013 °Elsevier Interactive  Patient Education © 2017 Elsevier Inc. ° °

## 2016-09-04 NOTE — MAU Note (Addendum)
Noted blood last night when wiped, ? In urine.  Only saw a spot this morning.  Was dark when wiped.  Has been really constipated

## 2016-09-04 NOTE — MAU Provider Note (Signed)
History     CSN: [id number]  Arrival date and time: 09/04/16 4098   First Provider Initiated Contact with Patient 09/04/16 [phone]      No chief complaint on file.  Patient is a 41 year old G4 P2 at 14 weeks and 4 days who presents today to the [facility] U with an episode of bright red blood after using the bathroom yesterday and continued spotting this morning. She denies any abdominal cramping at this time. She reports no abnormal vaginal discharge. She has had some intermittent abdominal pain since beginning of pregnancy which she attributes to gas. We were unable to get heart tones with the Doppler however in the office this week they struggled to get heart tones as well without the ultrasound. She has no other complaints at this time. Patient denied anything in the vagina over the past several days    OB History    Gravida Para Term Preterm AB Living   4 2 2   1 2    SAB TAB Ectopic Multiple Live Births   1              Past Medical History:  Diagnosis Date  . ADHD (attention deficit hyperactivity disorder)   . Allergy   . Anemia    iron def  . Anxiety   . BV (bacterial vaginosis)    recurrent-uses boric acid  . Depression   . Dysmenorrhea   . Substance abuse     Past Surgical History:  Procedure Laterality Date  . HERNIA REPAIR      History reviewed. No pertinent family history.  Social History  Substance Use Topics  . Smoking status: Former Smoker    Types: Cigarettes    Quit date: 08/13/2016  . Smokeless tobacco: Never Used  . Alcohol use Yes     Comment: 40 onces of beer a night/stopped after +UPT    Allergies: No Known Allergies  Prescriptions Prior to Admission  Medication Sig Dispense Refill Last Dose  . Prenatal Vit-Fe Fumarate-FA (PRENATAL MULTIVITAMIN) TABS tablet Take 1 tablet by mouth daily at 12 noon.   09/04/2016 at Unknown time    Review of Systems  Constitutional: Negative for chills, fatigue and fever.  Respiratory: Negative for cough and  shortness of breath.   Cardiovascular: Negative for chest pain and palpitations.  Gastrointestinal: Negative for abdominal distention, abdominal pain, constipation, diarrhea, nausea and vomiting.  Genitourinary: Negative for difficulty urinating, dysuria, flank pain and frequency.  Musculoskeletal: Negative for arthralgias and back pain.  Neurological: Negative for dizziness and weakness.   Physical Exam   Blood pressure 115/73, pulse 79, temperature 99.6 F (37.6 C), temperature source Oral, resp. rate 16, weight 158 lb (71.7 kg), last menstrual period 07/03/2016.  Physical Exam  Vitals reviewed. Constitutional: She is oriented to person, place, and time. She appears well-developed and well-nourished.  HENT:  Head: Normocephalic and atraumatic.  Cardiovascular: Normal rate and intact distal pulses.   Respiratory: Effort normal. No respiratory distress.  GI: Soft. Bowel sounds are normal. She exhibits no distension. There is no tenderness. There is no rebound and no guarding.  Genitourinary:  Genitourinary Comments: Minimal tenderness with bimanual exam, no cervical motion tenderness, old dried blood noted in the vaginal vault cervix closed.  Neurological: She is alert and oriented to person, place, and time.  Skin: Skin is warm and dry.  Psychiatric: She has a normal mood and affect. Her behavior is normal.    MAU Course  Procedures  MDM In MA U  patient underwent following testing  -Exam which revealed mild old blood in the vaginal vault no active bleeding  -Wet prep which was negative  -Gonorrhea and chlamydia which are pending  -Ultrasound which revealed a viable IUP at 13 weeks and 6 days with a heart rate of 1 51 bpm  Assessment and Plan  #1: Vaginal bleeding in pregnancy, unclear etiology at this point no acute concern for abruption or placenta previa. Okay to DC home.   Ernestina Pennaicholas Schenk 09/04/2016, 8:55 AM

## 2016-09-05 LAB — GC/CHLAMYDIA PROBE AMP (~~LOC~~) NOT AT ARMC
Chlamydia: NEGATIVE
Neisseria Gonorrhea: NEGATIVE

## 2016-09-06 ENCOUNTER — Encounter: Payer: Self-pay | Admitting: Obstetrics and Gynecology

## 2016-09-06 ENCOUNTER — Telehealth: Payer: Self-pay | Admitting: Family Medicine

## 2016-09-06 ENCOUNTER — Telehealth: Payer: Self-pay | Admitting: Obstetrics and Gynecology

## 2016-09-06 DIAGNOSIS — R8271 Bacteriuria: Secondary | ICD-10-CM | POA: Insufficient documentation

## 2016-09-06 LAB — CULTURE, OB URINE

## 2016-09-06 MED ORDER — CEPHALEXIN 500 MG PO CAPS: 500.0000 mg | ORAL_CAPSULE | Freq: Three times a day (TID) | ORAL | 0 refills | Status: DC

## 2016-09-06 NOTE — Telephone Encounter (Signed)
Spoke to patient. She has 20,000 group B strep in her urine. Likely not cause of any of her symptoms but given that she is GBS positive Will treat with Keflex 500 mg 3 times a day for a week. Discussed with patient these results she voiced understanding and will pick up her prescription.

## 2016-09-06 NOTE — Telephone Encounter (Signed)
Is going to be seen in high risk clinic march 2 which was the first available.  She was told she should be seen here until then.  Please advise

## 2016-09-07 ENCOUNTER — Encounter (HOSPITAL_COMMUNITY): Payer: Self-pay | Admitting: *Deleted

## 2016-09-07 ENCOUNTER — Inpatient Hospital Stay (HOSPITAL_COMMUNITY)
Admission: AD | Admit: 2016-09-07 | Discharge: 2016-09-07 | Disposition: A | Discharge status: Home / Self Care | Payer: Medicaid Other | Source: Ambulatory Visit | Attending: Obstetrics & Gynecology | Admitting: Obstetrics & Gynecology

## 2016-09-07 DIAGNOSIS — O26899 Other specified pregnancy related conditions, unspecified trimester: Secondary | ICD-10-CM

## 2016-09-07 DIAGNOSIS — R109 Unspecified abdominal pain: Secondary | ICD-10-CM | POA: Insufficient documentation

## 2016-09-07 DIAGNOSIS — Z3A15 15 weeks gestation of pregnancy: Secondary | ICD-10-CM | POA: Diagnosis not present

## 2016-09-07 DIAGNOSIS — O209 Hemorrhage in early pregnancy, unspecified: Secondary | ICD-10-CM | POA: Diagnosis present

## 2016-09-07 DIAGNOSIS — N930 Postcoital and contact bleeding: Secondary | ICD-10-CM

## 2016-09-07 DIAGNOSIS — Z87891 Personal history of nicotine dependence: Secondary | ICD-10-CM | POA: Diagnosis not present

## 2016-09-07 DIAGNOSIS — R102 Pelvic and perineal pain: Secondary | ICD-10-CM

## 2016-09-07 LAB — URINALYSIS, ROUTINE W REFLEX MICROSCOPIC
Bilirubin Urine: NEGATIVE
Glucose, UA: NEGATIVE mg/dL
Ketones, ur: NEGATIVE mg/dL
Nitrite: NEGATIVE
Protein, ur: NEGATIVE mg/dL
Specific Gravity, Urine: 1.018 (ref 1.005–1.030)
pH: 6 (ref 5.0–8.0)

## 2016-09-07 LAB — URINALYSIS, MICROSCOPIC (REFLEX)

## 2016-09-07 NOTE — Telephone Encounter (Signed)
Spoke with patient and she voiced understanding.  Explained to her that she will still be able to get appropriate screening at this visit.  Jazmin Hartsell,CMA

## 2016-09-07 NOTE — MAU Note (Signed)
PT  SAYS  SHE WAS HERE  LAST WEEK  FOR  BLEEDING- DID U/S-     ALL 0K.      STILL  HAS ACHING  PAINS-  RN CALLED HER YESTERDAY   - GAVE   RX  -  KEFLEX-  STARTED  LAST NIGHT.Marland Kitchen.     VAG  BLEEDING  STARTED AT 0500-       WHEN  WENT  TO B-ROOM -     WHEN SHE   WIPED  AND  THEN ON PAD  ON WAY TO HOSPITAL.   IN TRIAGE -  SPOTTING.          HAS  ACHY ABD. - 0     .  APPOINTMENT  FOR [facility]-    3-2.

## 2016-09-07 NOTE — MAU Provider Note (Signed)
History     CSN: [id number]  Arrival date and time: 09/07/16 0605   First Provider Initiated Contact with Patient 09/07/16 [phone]      Chief Complaint  Patient presents with  . Vaginal Bleeding   Vaginal Bleeding  The patient's primary symptoms include vaginal bleeding. The patient's pertinent negatives include no pelvic pain. This is a new problem. Episode onset: 3 days ago. The problem occurs intermittently. The problem has been unchanged. The patient is experiencing no pain. The problem affects the left (some occasional "twinges" on the left side.) side. She is pregnant. Associated symptoms include constipation (last BM about 2 days ago. ). Pertinent negatives include no chills, diarrhea, dysuria, fever, frequency, nausea, urgency or vomiting. The vaginal discharge was bloody. The vaginal bleeding is spotting (with wiping this morning. ). She has not been passing clots. She has not been passing tissue. The symptoms are aggravated by intercourse. She has tried nothing for the symptoms. She is sexually active (patient reports that she has had intercourse in the last 24 hours. ). Menstrual history: LMP 07/03/16.   Past Medical History:  Diagnosis Date  . ADHD (attention deficit hyperactivity disorder)   . Allergy   . Anemia    iron def  . Anxiety   . BV (bacterial vaginosis)    recurrent-uses boric acid  . Depression   . Dysmenorrhea   . Substance abuse     Past Surgical History:  Procedure Laterality Date  . HERNIA REPAIR      History reviewed. No pertinent family history.  Social History  Substance Use Topics  . Smoking status: Former Smoker    Types: Cigarettes    Quit date: 08/13/2016  . Smokeless tobacco: Never Used  . Alcohol use Yes     Comment: 40 onces of beer a night/stopped after +UPT    Allergies: No Known Allergies  Prescriptions Prior to Admission  Medication Sig Dispense Refill Last Dose  . cephALEXin (KEFLEX) 500 MG capsule Take 1 capsule (500 mg  total) by mouth 3 (three) times daily. 21 capsule 0 09/06/2016 at Unknown time  . Prenatal Vit-Fe Fumarate-FA (PRENATAL MULTIVITAMIN) TABS tablet Take 1 tablet by mouth daily at 12 noon.   09/06/2016 at Unknown time    Review of Systems  Constitutional: Negative for chills and fever.  Gastrointestinal: Positive for constipation (last BM about 2 days ago. ). Negative for diarrhea, nausea and vomiting.  Genitourinary: Positive for vaginal bleeding. Negative for dysuria, frequency, pelvic pain and urgency.   Physical Exam   Blood pressure 115/59, pulse 78, temperature 98.2 F (36.8 C), temperature source Oral, resp. rate 20, last menstrual period 07/03/2016.  Physical Exam  Nursing note and vitals reviewed. Constitutional: She is oriented to person, place, and time. She appears well-developed and well-nourished. No distress.  HENT:  Head: Normocephalic.  Cardiovascular: Normal rate.   Respiratory: Effort normal.  GI: Soft. There is no tenderness. There is no rebound.  Genitourinary:  Genitourinary Comments:  External: no lesion Vagina: small amount of brown mucousy blood in the vagina.  Cervix: pink, smooth, no CMT, closed/thick  Uterus: AGA, FHT with doppler    Neurological: She is alert and oriented to person, place, and time.  Skin: Skin is warm and dry.  Psychiatric: She has a normal mood and affect.    MAU Course  Procedures  MDM   Assessment and Plan   1. Postcoital bleeding   2. Pain of round ligament complicating pregnancy, antepartum   3. [redacted]  weeks gestation of pregnancy    DC home Comfort measures reviewed  2nd Trimester precautions  Bleeding precautions RX: none  Return to MAU as needed FU with OB as planned  Follow-up Information    [facility].   Specialty:  Obstetrics and Gynecology Contact information: [address] [place] [id number] [phone]           Tawnya CrookHogan, Curley Hogen  Donovan 09/07/2016, 6:44 AM

## 2016-09-07 NOTE — Telephone Encounter (Signed)
That appointment is in 2 weeks from today, she will be 694w0d at that visit. Based on chart review, she should be ok to just keep that appointment, unless she is worried about something and wants to be seen here before her high risk appointment  Please inform patient  Yesenia DodrillBrittany J Karla Vines, MD

## 2016-09-07 NOTE — Discharge Instructions (Signed)

## 2016-09-17 ENCOUNTER — Ambulatory Visit: Payer: Medicaid Other

## 2016-09-21 ENCOUNTER — Encounter: Payer: Self-pay | Admitting: Obstetrics and Gynecology

## 2016-09-21 ENCOUNTER — Ambulatory Visit: Payer: Medicaid Other

## 2016-09-21 ENCOUNTER — Other Ambulatory Visit (HOSPITAL_COMMUNITY)
Admission: RE | Admit: 2016-09-21 | Discharge: 2016-09-21 | Disposition: A | Discharge status: Home / Self Care | Payer: Medicaid Other | Source: Ambulatory Visit | Attending: Obstetrics and Gynecology | Admitting: Obstetrics and Gynecology

## 2016-09-21 ENCOUNTER — Ambulatory Visit (INDEPENDENT_AMBULATORY_CARE_PROVIDER_SITE_OTHER): Payer: Medicaid Other | Admitting: Obstetrics and Gynecology

## 2016-09-21 ENCOUNTER — Telehealth: Payer: Self-pay | Admitting: Family Medicine

## 2016-09-21 VITALS — BP 115/75 | HR 101 | Wt 158.1 lb

## 2016-09-21 DIAGNOSIS — A6009 Herpesviral infection of other urogenital tract: Secondary | ICD-10-CM

## 2016-09-21 DIAGNOSIS — R8271 Bacteriuria: Secondary | ICD-10-CM

## 2016-09-21 DIAGNOSIS — Z87898 Personal history of other specified conditions: Secondary | ICD-10-CM

## 2016-09-21 DIAGNOSIS — O09522 Supervision of elderly multigravida, second trimester: Secondary | ICD-10-CM

## 2016-09-21 DIAGNOSIS — F411 Generalized anxiety disorder: Secondary | ICD-10-CM

## 2016-09-21 DIAGNOSIS — O98312 Other infections with a predominantly sexual mode of transmission complicating pregnancy, second trimester: Secondary | ICD-10-CM

## 2016-09-21 DIAGNOSIS — F1911 Other psychoactive substance abuse, in remission: Secondary | ICD-10-CM

## 2016-09-21 DIAGNOSIS — Z01419 Encounter for gynecological examination (general) (routine) without abnormal findings: Secondary | ICD-10-CM | POA: Insufficient documentation

## 2016-09-21 DIAGNOSIS — A6000 Herpesviral infection of urogenital system, unspecified: Secondary | ICD-10-CM

## 2016-09-21 DIAGNOSIS — Z1151 Encounter for screening for human papillomavirus (HPV): Secondary | ICD-10-CM | POA: Diagnosis not present

## 2016-09-21 DIAGNOSIS — O09529 Supervision of elderly multigravida, unspecified trimester: Secondary | ICD-10-CM | POA: Insufficient documentation

## 2016-09-21 LAB — POCT URINALYSIS DIP (DEVICE)
Bilirubin Urine: NEGATIVE
Glucose, UA: NEGATIVE mg/dL
Ketones, ur: NEGATIVE mg/dL
Nitrite: NEGATIVE
Protein, ur: NEGATIVE mg/dL
Specific Gravity, Urine: 1.02 (ref 1.005–1.030)
Urobilinogen, UA: 0.2 mg/dL (ref 0.0–1.0)
pH: 6 (ref 5.0–8.0)

## 2016-09-21 NOTE — Patient Instructions (Signed)

## 2016-09-21 NOTE — Progress Notes (Signed)
Initial prenatal education packet given to patient Pap today Defer flu vaccine till next visit Schedule anatomy u/s  And genetic counseling today PHQ-9=11; GAD-7=9 Patient being seen by [facility], has appt @ 1100, Dr Alysia PennaErvin and Asher MuirJamie have been notified Consent for random UDS signed

## 2016-09-22 LAB — OXYCO+ALC+CRT-SCR, UR
Amphetamine Screen, Ur: NEGATIVE ng/mL
Barbiturate Screen, Ur: NEGATIVE ng/mL
Benzodiazepine Screen, Urine: NEGATIVE ng/mL
Cannabinoids Ur Ql Scn: NEGATIVE ng/mL
Cocaine(Metab.)Screen, Urine: NEGATIVE ng/mL
Creatinine(Crt), U: 146.1 mg/dL (ref 20.0–300.0)
Ethanol Quan, Urine: NEGATIVE %
Methadone Scn, Ur: NEGATIVE ng/mL
Opiate Scrn, Ur: NEGATIVE ng/mL
Oxycodone+Oxymorphone Ur Ql Scn: NEGATIVE ng/mL
PCP Scrn, Ur: NEGATIVE ng/mL
Ph of Urine: 5.9 (ref 4.5–8.9)
Propoxyphene, Screen: NEGATIVE ng/mL

## 2016-09-23 ENCOUNTER — Inpatient Hospital Stay (HOSPITAL_COMMUNITY)
Admission: AD | Admit: 2016-09-23 | Discharge: 2016-09-23 | Disposition: A | Discharge status: Home / Self Care | Payer: Medicaid Other | Source: Ambulatory Visit | Attending: Obstetrics & Gynecology | Admitting: Obstetrics & Gynecology

## 2016-09-23 ENCOUNTER — Encounter (HOSPITAL_COMMUNITY): Payer: Self-pay | Admitting: *Deleted

## 2016-09-23 DIAGNOSIS — Z803 Family history of malignant neoplasm of breast: Secondary | ICD-10-CM | POA: Diagnosis not present

## 2016-09-23 DIAGNOSIS — Z8619 Personal history of other infectious and parasitic diseases: Secondary | ICD-10-CM | POA: Insufficient documentation

## 2016-09-23 DIAGNOSIS — Z87891 Personal history of nicotine dependence: Secondary | ICD-10-CM | POA: Diagnosis not present

## 2016-09-23 DIAGNOSIS — Z833 Family history of diabetes mellitus: Secondary | ICD-10-CM | POA: Insufficient documentation

## 2016-09-23 DIAGNOSIS — F909 Attention-deficit hyperactivity disorder, unspecified type: Secondary | ICD-10-CM | POA: Insufficient documentation

## 2016-09-23 DIAGNOSIS — O9989 Other specified diseases and conditions complicating pregnancy, childbirth and the puerperium: Secondary | ICD-10-CM

## 2016-09-23 DIAGNOSIS — D509 Iron deficiency anemia, unspecified: Secondary | ICD-10-CM | POA: Insufficient documentation

## 2016-09-23 DIAGNOSIS — R1030 Lower abdominal pain, unspecified: Secondary | ICD-10-CM | POA: Diagnosis not present

## 2016-09-23 DIAGNOSIS — O9A212 Injury, poisoning and certain other consequences of external causes complicating pregnancy, second trimester: Secondary | ICD-10-CM | POA: Diagnosis not present

## 2016-09-23 DIAGNOSIS — Z3A17 17 weeks gestation of pregnancy: Secondary | ICD-10-CM | POA: Diagnosis not present

## 2016-09-23 DIAGNOSIS — R1032 Left lower quadrant pain: Secondary | ICD-10-CM | POA: Insufficient documentation

## 2016-09-23 DIAGNOSIS — Z8249 Family history of ischemic heart disease and other diseases of the circulatory system: Secondary | ICD-10-CM | POA: Insufficient documentation

## 2016-09-23 DIAGNOSIS — Z841 Family history of disorders of kidney and ureter: Secondary | ICD-10-CM | POA: Diagnosis not present

## 2016-09-23 DIAGNOSIS — Z818 Family history of other mental and behavioral disorders: Secondary | ICD-10-CM | POA: Insufficient documentation

## 2016-09-23 LAB — URINALYSIS, ROUTINE W REFLEX MICROSCOPIC
Bilirubin Urine: NEGATIVE
Glucose, UA: NEGATIVE mg/dL
Hgb urine dipstick: NEGATIVE
Ketones, ur: NEGATIVE mg/dL
Nitrite: NEGATIVE
Protein, ur: NEGATIVE mg/dL
Specific Gravity, Urine: 1.021 (ref 1.005–1.030)
pH: 6 (ref 5.0–8.0)

## 2016-09-23 MED ORDER — IBUPROFEN 600 MG PO TABS
600.0000 mg | ORAL_TABLET | Freq: Once | ORAL | Status: AC
  Administered 2016-09-23: 600 mg via ORAL
  Filled 2016-09-23: qty 1

## 2016-09-23 MED ORDER — IBUPROFEN 600 MG PO TABS: 600.0000 mg | ORAL_TABLET | Freq: Four times a day (QID) | ORAL | 0 refills | Status: DC | PRN

## 2016-09-23 NOTE — MAU Provider Note (Signed)
History     CSN: [id number]  Arrival date and time: 09/23/16 0556   None     Chief Complaint  Patient presents with  . Abdominal Injury   HPI  Mrs.. Yesenia Weeks is a 41 y.o. female [phone] @ [redacted]w[redacted]d here in MAU with pain.  Patient says she was taking off her sweat pants and hit her left side of her hip and lower abdomen on a chair. She wants to make sure the baby is ok.   She is having off and on pain in the left side of her hip and lower back. She denies vaginal bleeding, No leaking of water.  + fetal movements   OB History    Gravida Para Term Preterm AB Living   4 2 2   1 2    SAB TAB Ectopic Multiple Live Births   1       2      Past Medical History:  Diagnosis Date  . ADHD (attention deficit hyperactivity disorder)   . Allergy   . Anemia    iron def  . Anxiety   . BV (bacterial vaginosis)    recurrent-uses boric acid  . Depression   . Dysmenorrhea   . Substance abuse     Past Surgical History:  Procedure Laterality Date  . HERNIA REPAIR     x2    Family History  Problem Relation Age of Onset  . Cancer Maternal Aunt     breast  . Diabetes Maternal Grandmother   . Hypertension Maternal Grandmother   . Mental illness Maternal Grandmother   . Diabetes Paternal Grandmother   . Kidney disease Paternal Grandmother   . Mental illness Paternal Grandmother     Social History  Substance Use Topics  . Smoking status: Former Smoker    Types: Cigarettes    Quit date: 08/13/2016  . Smokeless tobacco: Never Used  . Alcohol use Yes     Comment: 40 onces of beer a night/stopped after +UPT    Allergies: No Known Allergies  Prescriptions Prior to Admission  Medication Sig Dispense Refill Last Dose  . Prenatal Vit-Fe Fumarate-FA (PRENATAL MULTIVITAMIN) TABS tablet Take 1 tablet by mouth daily at 12 noon.   09/22/2016 at Unknown time   Results for orders placed or performed during the hospital encounter of 09/23/16 (from the past 48 hour(s))  Urinalysis, Routine w  reflex microscopic     Status: Abnormal   Collection Time: 09/23/16  6:15 AM  Result Value Ref Range   Color, Urine YELLOW YELLOW   APPearance CLEAR CLEAR   Specific Gravity, Urine 1.021 1.005 - 1.030   pH 6.0 5.0 - 8.0   Glucose, UA NEGATIVE NEGATIVE mg/dL   Hgb urine dipstick NEGATIVE NEGATIVE   Bilirubin Urine NEGATIVE NEGATIVE   Ketones, ur NEGATIVE NEGATIVE mg/dL   Protein, ur NEGATIVE NEGATIVE mg/dL   Nitrite NEGATIVE NEGATIVE   Leukocytes, UA TRACE (A) NEGATIVE   RBC / HPF 0-5 0 - 5 RBC/hpf   WBC, UA 6-30 0 - 5 WBC/hpf   Bacteria, UA RARE (A) NONE SEEN   Squamous Epithelial / LPF 0-5 (A) NONE SEEN   Mucous PRESENT   Culture, OB Urine     Status: None   Collection Time: 09/23/16  6:15 AM  Result Value Ref Range   Specimen Description OB CLEAN CATCH    Special Requests Normal    Culture      NO GROWTH Performed at [facility], [address] [place], [place] [address]    Report Status 09/24/2016 FINAL     Review of Systems  Gastrointestinal: Positive for abdominal pain.  Genitourinary: Negative for dysuria and vaginal bleeding.   Physical Exam   Blood pressure 116/70, pulse 93, temperature 97.8 F (36.6 C), resp. rate 18, height 5\' 7"  (1.702 m), weight 160 lb (72.6 kg), last menstrual period 07/03/2016.  Physical Exam  Constitutional: She is oriented to person, place, and time. She appears well-developed and well-nourished. No distress.  HENT:  Head: Normocephalic.  Respiratory: Effort normal.  GI: Soft. There is tenderness in the suprapubic area and left lower quadrant. There is no rigidity, no rebound and no guarding.  Negative ecchymosis   Genitourinary:  Genitourinary Comments: Dilation: Closed Effacement (%): Thick Exam by:: Venia CarbonJennifer Rasch NP  Musculoskeletal: Normal range of motion.  Neurological: She is alert and oriented to person, place, and time.  Skin: Skin is warm. She is not diaphoretic.  Psychiatric: Her behavior is normal.   MAU  Course  Procedures  None  MDM  + fetal heart tones via doppler Urine culture pending Ibuprofen 600 mg PO   Assessment and Plan   A:  1. Traumatic injury during pregnancy in second trimester   2. Abdominal pain, left lower quadrant     P:  Discharge home in stable condition Strict return precautions Ok to use ibuprofen sparingly as directed on the bottle.   Duane LopeJennifer I Rasch, NP 09/24/2016 1:41 PM

## 2016-09-23 NOTE — Progress Notes (Signed)
J Rasch NP notified of pt's admission and status. Will see pt 

## 2016-09-23 NOTE — MAU Note (Signed)
Was taking off pants and hit abd on chair about 0530. May have hit L hip because started having hip pain. Denies bleeding or LOF. Some lower back pain off and on

## 2016-09-23 NOTE — Progress Notes (Signed)
J. Rasch NP in earlier to discuss d/c plan. Written and verbal d/c instructions given and understanding voiced. 

## 2016-09-23 NOTE — Discharge Instructions (Signed)
Constipation, Adult °Constipation is when a person: °· Poops (has a bowel movement) fewer times in a week than normal. °· Has a hard time pooping. °· Has poop that is dry, hard, or bigger than normal. ° °Follow these instructions at home: °Eating and drinking ° °· Eat foods that have a lot of fiber, such as: °? Fresh fruits and vegetables. °? Whole grains. °? Beans. °· Eat less of foods that are high in fat, low in fiber, or overly processed, such as: °? French fries. °? Hamburgers. °? Cookies. °? Candy. °? Soda. °· Drink enough fluid to keep your pee (urine) clear or pale yellow. °General instructions °· Exercise regularly or as told by your doctor. °· Go to the restroom when you feel like you need to poop. Do not hold it in. °· Take over-the-counter and prescription medicines only as told by your doctor. These include any fiber supplements. °· Do pelvic floor retraining exercises, such as: °? Doing deep breathing while relaxing your lower belly (abdomen). °? Relaxing your pelvic floor while pooping. °· Watch your condition for any changes. °· Keep all follow-up visits as told by your doctor. This is important. °Contact a doctor if: °· You have pain that gets worse. °· You have a fever. °· You have not pooped for 4 days. °· You throw up (vomit). °· You are not hungry. °· You lose weight. °· You are bleeding from the anus. °· You have thin, pencil-like poop (stool). °Get help right away if: °· You have a fever, and your symptoms suddenly get worse. °· You leak poop or have blood in your poop. °· Your belly feels hard or bigger than normal (is bloated). °· You have very bad belly pain. °· You feel dizzy or you faint. °This information is not intended to replace advice given to you by your health care provider. Make sure you discuss any questions you have with your health care provider. °Document Released: 12/26/2007 Document Revised: 01/27/2016 Document Reviewed: 12/28/2015 °Elsevier Interactive Patient Education ©  2017 Elsevier Inc. ° °

## 2016-09-24 LAB — CULTURE, OB URINE
Culture: NO GROWTH
Special Requests: NORMAL

## 2016-09-25 LAB — CYTOLOGY - PAP
Adequacy: ABSENT
Diagnosis: NEGATIVE
HPV: NOT DETECTED

## 2016-09-26 ENCOUNTER — Encounter: Payer: Self-pay | Admitting: Obstetrics & Gynecology

## 2016-09-26 DIAGNOSIS — Z3490 Encounter for supervision of normal pregnancy, unspecified, unspecified trimester: Secondary | ICD-10-CM | POA: Insufficient documentation

## 2016-09-26 NOTE — Progress Notes (Signed)
Subjective:  Yesenia Weeks is a 41 y.o. [id number] at 7912w5d being seen today for ongoing prenatal care. [facility]  [facility].  She is currently monitored for the following issues for this high-risk pregnancy and has ANEMIA, IRON DEFICIENCY, UNSPEC.; Mood disorder (HCC); TOBACCO USER; RHINITIS, ALLERGIC; ACNE, MILD; ASTEATOTIC ECZEMA; HALLUX VALGUS, ACQUIRED; Genital herpes; Family history of breast cancer; Anxiety state; GBS bacteriuria; and AMA (advanced maternal age) multigravida 35+ on her problem list.  Patient reports no complaints.  Contractions: Not present. Vag. Bleeding: None.  Movement: Present. Denies leaking of fluid.   The following portions of the patient's history were reviewed and updated as appropriate: allergies, current medications, past family history, past medical history, past social history, past surgical history and problem list. Problem list updated.  Objective:   Vitals:   09/21/16 0943  BP: 115/75  Pulse: (!) 101  Weight: 158 lb 1.6 oz (71.7 kg)    Fetal Status: Fetal Heart Rate (bpm): 152   Movement: Present     General:  Alert, oriented and cooperative. Patient is in no acute distress.  Skin: Skin is warm and dry. No rash noted.   Cardiovascular: Normal heart rate noted  Respiratory: Normal respiratory effort, no problems with respiration noted  Abdomen: Soft, gravid, appropriate for gestational age. Pain/Pressure: Present     Pelvic:  Cervical exam performed        Extremities: Normal range of motion.  Edema: None  Mental Status: Normal mood and affect. Normal behavior. Normal judgment and thought content.   Urinalysis:      Assessment and Plan:  Pregnancy: [id number] at 1112w5d  1. Supervision of high risk elderly multigravida in second trimester  - US MFM OB COMP + 14 WK; Future - AMB referral to maternal fetal medicine - Cytology - PAP  2. GBS bacteriuria Tx while in labor  3. Elderly  multigravida in second trimester Genetic counseling ordered  4. Anxiety state Declined to see Asher MuirJamie today. Has appt with her MD today  5. H/O drug abuse Consent to random UDS today  6. Genital herpes simplex, unspecified site Suppressive treatment at 34-36 weeks  Preterm labor symptoms and general obstetric precautions including but not limited to vaginal bleeding, contractions, leaking of fluid and fetal movement were reviewed in detail with the patient. Please refer to After Visit Summary for other counseling recommendations.  Return in about 4 weeks (around 10/19/2016) for OB visit.   Hermina StaggersMichael L Vanity Larsson, MD

## 2016-09-27 ENCOUNTER — Encounter (HOSPITAL_COMMUNITY): Payer: Self-pay | Admitting: Obstetrics and Gynecology

## 2016-09-28 ENCOUNTER — Ambulatory Visit: Payer: Medicaid Other

## 2016-10-02 ENCOUNTER — Other Ambulatory Visit: Payer: Self-pay | Admitting: Obstetrics and Gynecology

## 2016-10-02 ENCOUNTER — Ambulatory Visit (HOSPITAL_COMMUNITY)
Admission: RE | Admit: 2016-10-02 | Discharge: 2016-10-02 | Disposition: A | Discharge status: Home / Self Care | Payer: Medicaid Other | Source: Ambulatory Visit | Attending: Obstetrics and Gynecology | Admitting: Obstetrics and Gynecology

## 2016-10-02 ENCOUNTER — Encounter (HOSPITAL_COMMUNITY): Payer: Self-pay

## 2016-10-02 ENCOUNTER — Other Ambulatory Visit: Payer: Self-pay

## 2016-10-02 DIAGNOSIS — Z3689 Encounter for other specified antenatal screening: Secondary | ICD-10-CM | POA: Insufficient documentation

## 2016-10-02 DIAGNOSIS — Z315 Encounter for genetic counseling: Secondary | ICD-10-CM | POA: Insufficient documentation

## 2016-10-02 DIAGNOSIS — O09522 Supervision of elderly multigravida, second trimester: Secondary | ICD-10-CM

## 2016-10-02 DIAGNOSIS — Z3A18 18 weeks gestation of pregnancy: Secondary | ICD-10-CM | POA: Diagnosis not present

## 2016-10-02 LAB — QUAD SCREEN FOR MFM

## 2016-10-02 NOTE — Progress Notes (Signed)
Genetic Counseling  High-Risk Gestation Note  Appointment Date:  10/02/2016 Referred By: Yesenia Dodrill, MD Date of Birth:  January 28, 1976 Partner: Yesenia Weeks   Pregnancy History: [id number] Estimated Date of Delivery: 03/01/17 Estimated Gestational Age: [redacted]w[redacted]d Attending: Alpha Gula, MD   Yesenia Weeks and her partner, Yesenia Weeks, were seen for genetic counseling because of a maternal age of 41 y.o..     In summary:  Discussed AMA and associated risk for fetal aneuploidy  Discussed options for screening  Quad screen- elected MSAFP only today for ONTD screening  NIPS- elected to pursue today ([facility])  Ultrasound- performed today  Discussed diagnostic testing options  Amniocentesis-declined  Reviewed family history concerns  Father of the pregnancy has previous son with cystic fibrosis  Reviewed autosomal recessive inheritance of CF and patient's general population risk to be carrier based on reported family history  Patient elected to pursue CF carrier screening today via CFTR sequencing (Counsyl  Reviewed exposures in first trimester prior to patient being aware of pregnancy  Fetal echocardiogram being scheduled given earlier alcohol use in pregnancy  Discussed carrier screening options- patient elected to pursue today  CF  SMA  Hemoglobinopathies  They were counseled regarding maternal age and the association with risk for chromosome conditions due to nondisjunction with aging of the ova.   We reviewed chromosomes, nondisjunction, and the associated 1 in 80 risk for fetal aneuploidy related to a maternal age of 41 y.o. at [redacted]w[redacted]d gestation. They were counseled that the risk for aneuploidy decreases as gestational age increases, accounting for those pregnancies which spontaneously abort.  We specifically discussed Down syndrome (trisomy 14), trisomies 66 and 53, and sex chromosome aneuploidies (47,XXX and 47,XXY) including the common features and prognoses of  each.   We reviewed available screening options including Quad screen, noninvasive prenatal screening (NIPS)/cell free DNA (cfDNA) screening, and detailed ultrasound.  They were counseled that screening tests are used to modify a patient's a priori risk for aneuploidy, typically based on age. This estimate provides a pregnancy specific risk assessment. We reviewed the benefits and limitations of each option. Specifically, we discussed the conditions for which each test screens, the detection rates, and false positive rates of each. They were also counseled regarding diagnostic testing via amniocentesis. We reviewed the approximate 1 in 300-500 risk for complications from amniocentesis, including spontaneous pregnancy loss. We discussed the possible results that the tests might provide including: positive, negative, unanticipated, and no result. Finally, they were counseled regarding the cost of each option and potential out of pocket expenses.  After consideration of all the options, she elected to proceed with NIPS today ([facility] through [facility]) and MSAFP only for ONTD screening today.  Those results will be available in 8-10 days.  She declined amniocentesis.   A complete ultrasound was performed today. The ultrasound report will be sent under separate cover. There were no visualized fetal anomalies or markers suggestive of aneuploidy. Diagnostic testing was declined today.  They understand that screening tests cannot rule out all birth defects or genetic syndromes. The patient was advised of this limitation and states she still does not want additional testing at this time.   Both family histories were reviewed and found to be contributory for cystic fibrosis for the father of the pregnancy's son with a previous partner. He reported that his son is currently 65 years old and is followed through [facility] in [place]. He reported that his son was diagnosed at approximately 5 months of  age after  difficulty gaining weight. Yesenia Weeks reported that he and his son's mother each carry the gene for cystic fibrosis. He reported that his son's treatment involves taking daily enzymes and that his son is very active and is growing well. He did not have information regarding the specific CFTR gene changes identified.   Cystic fibrosis (CF) is an autosomal recessive genetic condition and occurs in approximately 1 in 30,300 Caucasian births and approximately 1 in 60,900 African American births.  This means approximately 1 in 59 African Americans is a CF carrier.  Currently over 1,000 mutations have been identified in the CFTR gene. We spent time reviewing the autosomal recessive inheritance of cystic fibrosis and the 25% chance, when both parents are carriers, to have a child with CF.  We discussed that there are thought to be thousands of mutations which can cause the CF gene to not function properly. CF results in thickened secretions in the lungs, digestive and reproductive systems.  This life-limiting condition is characterized by chronic respiratory infections necessitating daily chest therapies, pancreatic dysfunction disrupting the body's ability to break down food and extract nutrients as it should, and possible infertility in males.  With the advent of new therapies, the average lifespan for people with CF is in the 40's.  Symptoms can be considerably variable, and in some cases symptoms may be quite mild.  Sometimes, the specific mutations a person carries can indicate whether their symptoms may be associated with milder or more severe symptoms; this is not always the case. Prior to carrier screening for [name], the chance for CF in the current pregnancy is approximately 1 in 260 given the reported family history and no known consanguinity for the couple.   We discussed the option of carrier screening for [name] via sequencing of CFTR, which detects >99% of CF carriers.. Thus, a negative  carrier screen would significantly reduce but not eliminate the chance to be a carrier. In the case that both partners are identified to be carriers, prenatal diagnosis via amniocentesis would be available in a pregnancy. We also discussed that in [place], the newborn screening test will detect CF, but that carriers may come back as false positives.  After careful consideration, Ms. Clydia Mikel elected to proceed with CF carrier screening via CFTR sequencing (Counsyl) at the time of today's visit.    Additionally, Ms. Mattia reported that her brother was born with a congenital anomaly possibly involving the genitourinary tract or intestines. She reported that he was possibly born without a rectum and required a colostomy prior to having surgical correction. He is currently healthy and has no additional health concerns. We discussed that recurrence risk estimate for relatives is difficult to assess without additional information regarding this specific congenital anomaly and etiology, if known. Without further information regarding the provided family history, an accurate genetic risk cannot be calculated. Further genetic counseling is warranted if more information is obtained.  Ms Stephie Adriano  was provided with written information regarding cystic fibrosis (CF), spinal muscular atrophy (SMA) and hemoglobinopathies including the carrier frequency, availability of carrier screening and prenatal diagnosis if indicated.  In addition, we discussed that CF and hemoglobinopathies are routinely screened for as part of the [facility] newborn screening panel.  After further discussion, she also elected to pursue screening for SMA and hemoglobinopathies via Counsyl, along with CF carrier screening.  Ms Vana Roat reported that she was unaware of the pregnancy until approximately [redacted] Weeks gestation and had  exposures prior to that time. She reported alcohol use several times per week on average with approximately 3-4  drinks on each occasion, and she reported smoking cigarettes. She reported no alcohol exposure or smoking since becoming aware of the pregnancy. She denied exposure to environmental toxins or chemical agents. She denied the use of street drugs.  Prenatal alcohol exposure can increase the risk for growth delays, small head size, heart defects, eye and facial differences, as well as behavior problems and learning disabilities. The risk of these to occur tends to increase with the amount of alcohol consumed. However, because there is no identified safe amount of alcohol in pregnancy, it is recommended to completely avoid alcohol in pregnancy. Given the reported amount of early exposure, we discussed the additional screening options of targeted ultrasound and fetal echocardiogram. She denied significant viral illnesses during the course of her pregnancy.   Her medical and surgical histories were contributory for history of depression. She reported that she is currently seeking a new counselor or psychiatrist to see regarding this history. She reported that her primary OB provider is assisting her with this and that patient plans to follow-up regarding possible referrals at her next OB visit.  She is not currently treated with medication regarding this history.   I counseled this couple regarding the above risks and available options.  Most of the counseling was provided by Ophelia ShoulderGretchen Rosso, [employer] genetic counseling student, under my direct supervision. The approximate face-to-face time with the genetic counselor was 45 minutes.  Quinn PlowmanKaren Celisa Schoenberg, MS,  Certified Genetic Counselor 10/02/2016

## 2016-10-03 ENCOUNTER — Telehealth: Payer: Self-pay | Admitting: *Deleted

## 2016-10-03 NOTE — Telephone Encounter (Signed)
Per Dr Alysia PennaErvin, patient scheduled for fetal echocardiogram 4/9 @ 1100a. Called patient and gave her appt info as well as the address of the appointment. Patient voiced understanding.

## 2016-10-04 ENCOUNTER — Other Ambulatory Visit (HOSPITAL_COMMUNITY): Payer: Self-pay | Admitting: *Deleted

## 2016-10-04 DIAGNOSIS — O99310 Alcohol use complicating pregnancy, unspecified trimester: Secondary | ICD-10-CM

## 2016-10-05 ENCOUNTER — Other Ambulatory Visit: Payer: Self-pay

## 2016-10-11 ENCOUNTER — Telehealth (HOSPITAL_COMMUNITY): Payer: Self-pay | Admitting: MS"

## 2016-10-11 NOTE — Telephone Encounter (Signed)
Called Yesenia Weeks to discuss her prenatal cell free DNA test results.  [name] had Panorama testing through [facility].  Testing was offered because of maternal age.   The patient was identified by name and DOB.  We reviewed that these are within normal limits, showing a less than 1 in 10,000 risk for trisomies 21, 18 and 13, and monosomy X (Turner syndrome).  In addition, the risk for triploidy and sex chromosome trisomies (47,XXX and 47,XXY) was also low risk.  Mrs. X elected to have cffDNA analysis for 22q11 deletion syndrome, which was also low risk (<1 in 3330).  We reviewed that this testing identifies > 99% of pregnancies with trisomy 1021, trisomy 3613, sex chromosome trisomies (47,XXX and 47,XXY), and triploidy. The detection rate for trisomy 18 is 96%.  The detection rate for monosomy X is ~92%.  The false positive rate is <0.1% for all conditions. The patient requested that fetal sex not be reported.  She understands that this testing does not identify all genetic conditions.    Additionally, MSAFP screen was within normal range and thus, screen negative for open neural tube defects. Cystic fibrosis carrier screening is still pending. We will contact patient once results are available. All questions were answered to her satisfaction, she was encouraged to call with additional questions or concerns.  Quinn PlowmanKaren Mann Skaggs, MS Patent attorneyCertified Genetic Counselor

## 2016-10-15 ENCOUNTER — Telehealth (HOSPITAL_COMMUNITY): Payer: Self-pay | Admitting: MS"

## 2016-10-15 NOTE — Telephone Encounter (Signed)
Called Yesenia Weeks to discuss her carrier screening results. Ms. Jerilee FieldBoykin had carrier screening for the ACOG recommended conditions (SMA, CF, and hemoglobinopathies) through [facility]. The father of the pregnancy has a previous child with cystic fibrosis. The patient was identified by name and DOB. We reviewed that the results are negative for CF, SMA, and beta globin chain variants, indicating that she does not have a detectable gene alteration in any of the genes for which analysis was performed. We specifically reviewed that CFTR gene sequencing results reduce her risk to be a CF carrier to approximately 1 in 6500. Thus, the risk for CF in the current pregnancy has been reduced to approximately 1 in 26,000.  We reviewed that carrier screening does not detect all carriers of these conditions, but a normal result significantly decreases the likelihood of being a carrier, and therefore, the overall reproductive risk.   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 Carrier screening has not yet been performed for the father of the pregnancy for alpha thalassemia or other hemoglobinopathies. Alpha thalassemia is different in its inheritance compared to other hemoglobinopathies as there are two alpha globin genes on each chromosome 16 (aa/aa).  A person can be a carrier of one alpha gene mutation (aa/a-), also referred to as a "silent carrier" or of more than one mutation.  A person who carriers two alpha globin gene mutations can either carry them in cis (both on the same chromosome, denoted as aa/--) or in trans (on different chromosomes, denoted as a-/a-) .  Alpha thalassemia carriers of two mutations who have African ancestry are more likely to have a trans arrangements (a-/a-), and individuals with Asian ancestry who are alpha thalassemia carriers usually have a cis arrangement (aa/--) of their alpha globin gene mutations.  Alpha-thalassemia carriers with cis  configuration is reported to be rare in individuals with African ancestry. Given that the father of the pregnancy has no known family history of alpha thalassemia and no consanguinity to Ms. Gilani, he would have the general population risk to be an alpha thalassemia carrier.    Given Ms. Thrun's silent carrier status (aa/a-), the current pregnancy would only be at risk for Hemoglobin H disease (a-/--), if the father of the pregnancy is a carrier for two alpha globin mutations in cis (aa/--), in which case the risk for hemoglobin H disease in the pregnancy would be 1 in 4 (25%). However, given the reported family history, the risk for hemoglobin H disease in the current pregnancy is likely low. Screening for hemoglobinopathies is available to the father of the pregnancy by CBC and quantitative hemoglobin electrophoresis or via molecular testing through [facility]. Carriers of alpha-thalassemia (a-/a- or aa/--) have low MCV, low MCH, normal percentages of HbA2 and HbF and RBC inclusion bodies.  As iron deficiency is also associated with a low MCV, this should be ruled out by simultaneous serum ferritin studies.  If these studies are suggestive of alpha thalassemia carrier status, molecular testing of the alpha globin genes would be available to further clarify carrier status. All questions were answered to her satisfaction, she was encouraged to call with additional questions or concerns. ? Quinn PlowmanKaren Aeriel Boulay MS Patent attorneyCertified Genetic Counselor

## 2016-10-18 ENCOUNTER — Other Ambulatory Visit (HOSPITAL_COMMUNITY)
Admission: RE | Admit: 2016-10-18 | Discharge: 2016-10-18 | Disposition: A | Discharge status: Home / Self Care | Payer: Medicaid Other | Source: Ambulatory Visit | Attending: Obstetrics and Gynecology | Admitting: Obstetrics and Gynecology

## 2016-10-18 ENCOUNTER — Ambulatory Visit (INDEPENDENT_AMBULATORY_CARE_PROVIDER_SITE_OTHER): Payer: Medicaid Other | Admitting: Obstetrics and Gynecology

## 2016-10-18 ENCOUNTER — Encounter: Payer: Medicaid Other | Admitting: Advanced Practice Midwife

## 2016-10-18 VITALS — BP 121/75 | HR 92 | Wt 165.8 lb

## 2016-10-18 DIAGNOSIS — N760 Acute vaginitis: Secondary | ICD-10-CM | POA: Diagnosis not present

## 2016-10-18 DIAGNOSIS — B9689 Other specified bacterial agents as the cause of diseases classified elsewhere: Secondary | ICD-10-CM | POA: Insufficient documentation

## 2016-10-18 DIAGNOSIS — L298 Other pruritus: Secondary | ICD-10-CM | POA: Diagnosis present

## 2016-10-18 DIAGNOSIS — L292 Pruritus vulvae: Secondary | ICD-10-CM | POA: Diagnosis not present

## 2016-10-18 DIAGNOSIS — Z3492 Encounter for supervision of normal pregnancy, unspecified, second trimester: Secondary | ICD-10-CM

## 2016-10-18 DIAGNOSIS — N898 Other specified noninflammatory disorders of vagina: Secondary | ICD-10-CM

## 2016-10-18 DIAGNOSIS — Z113 Encounter for screening for infections with a predominantly sexual mode of transmission: Secondary | ICD-10-CM

## 2016-10-18 DIAGNOSIS — O0990 Supervision of high risk pregnancy, unspecified, unspecified trimester: Secondary | ICD-10-CM | POA: Insufficient documentation

## 2016-10-18 MED ORDER — PRENATAL VITAMINS 0.8 MG PO TABS: 1.0000 | ORAL_TABLET | Freq: Every day | ORAL | 12 refills | Status: DC

## 2016-10-18 NOTE — Patient Instructions (Signed)
Safe Medications in Pregnancy   Acne:  Benzoyl Peroxide  Salicylic Acid   Backache/Headache:  Tylenol: 2 regular strength every 4 hours OR        2 Extra strength every 6 hours   Colds/Coughs/Allergies:  Benadryl (alcohol free) 25 mg every 6 hours as needed  Breath right strips  Claritin  Cepacol throat lozenges  Chloraseptic throat spray  Cold-Eeze- up to three times per day  Cough drops, alcohol free  Flonase (by prescription only)  Guaifenesin  Mucinex  Robitussin DM (plain only, alcohol free)  Saline nasal spray/drops  Sudafed (pseudoephedrine) & Actifed * use only after [redacted] weeks gestation and if you do not have high blood pressure  Tylenol  Vicks Vaporub  Zinc lozenges  Zyrtec   Constipation:  Colace  Ducolax suppositories  Fleet enema  Glycerin suppositories  Metamucil  Milk of magnesia  Miralax  Senokot  Smooth move tea   Diarrhea:  Kaopectate  Imodium A-D   *NO pepto Bismol   Hemorrhoids:  Anusol  Anusol HC  Preparation H  Tucks   Indigestion:  Tums  Maalox  Mylanta  Zantac  Pepcid   Insomnia:  Benadryl (alcohol free) 25mg  every 6 hours as needed  Tylenol PM  Unisom, no Gelcaps   Leg Cramps:  Tums  MagGel   Nausea/Vomiting:  Bonine  Dramamine  Emetrol  Ginger extract  Sea bands  Meclizine  Nausea medication to take during pregnancy:  Unisom (doxylamine succinate 25 mg tablets) Take one tablet daily at bedtime. If symptoms are not adequately controlled, the dose can be increased to a maximum recommended dose of two tablets daily (1/2 tablet in the morning, 1/2 tablet mid-afternoon and one at bedtime).  Vitamin B6 100mg  tablets. Take one tablet twice a day (up to 200 mg per day).   Skin Rashes:  Aveeno products  Benadryl cream or 25mg  every 6 hours as needed  Calamine Lotion  1% cortisone cream   Yeast infection:  Gyne-lotrimin 7  Monistat 7    **If taking multiple medications, please check labels to avoid  duplicating the same active ingredients  **take medication as directed on the label  ** Do not exceed 4000 mg of tylenol in 24 hours  **Do not take medications that contain aspirin or ibuprofen          How a Baby Grows During Pregnancy Pregnancy begins when a female's sperm enters a female's egg (fertilization). This happens in one of the tubes (fallopian tubes) that connect the ovaries to the womb (uterus). The fertilized egg is called an embryo until it reaches 10 weeks. From 10 weeks until birth, it is called a fetus. The fertilized egg moves down the fallopian tube to the uterus. Then it implants into the lining of the uterus and begins to grow. The developing fetus receives oxygen and nutrients through the pregnant woman's bloodstream and the tissues that grow (placenta) to support the fetus. The placenta is the life support system for the fetus. It provides nutrition and removes waste. Learning as much as you can about your pregnancy and how your baby is developing can help you enjoy the experience. It can also make you aware of when there might be a problem and when to ask questions. How long does a typical pregnancy last? A pregnancy usually lasts 280 days, or about 40 weeks. Pregnancy is divided into three trimesters:  First trimester: 0-13 weeks.  Second trimester: 14-27 weeks.  Third trimester: 28-40 weeks. The day when  your baby is considered ready to be born (full term) is your estimated date of delivery. How does my baby develop month by month? First month  The fertilized egg attaches to the inside of the uterus.  Some cells will form the placenta. Others will form the fetus.  The arms, legs, brain, spinal cord, lungs, and heart begin to develop.  At the end of the first month, the heart begins to beat. Second month  The bones, inner ear, eyelids, hands, and feet form.  The genitals develop.  By the end of 8 weeks, all major organs are developing. Third  month  All of the internal organs are forming.  Teeth develop below the gums.  Bones and muscles begin to grow. The spine can flex.  The skin is transparent.  Fingernails and toenails begin to form.  Arms and legs continue to grow longer, and hands and feet develop.  The fetus is about 3 in (7.6 cm) long. Fourth month  The placenta is completely formed.  The external sex organs, neck, outer ear, eyebrows, eyelids, and fingernails are formed.  The fetus can hear, swallow, and move its arms and legs.  The kidneys begin to produce urine.  The skin is covered with a white waxy coating (vernix) and very fine hair (lanugo). Fifth month  The fetus moves around more and can be felt for the first time (quickening).  The fetus starts to sleep and wake up and may begin to suck its finger.  The nails grow to the end of the fingers.  The organ in the digestive system that makes bile (gallbladder) functions and helps to digest the nutrients.  If your baby is a girl, eggs are present in her ovaries. If your baby is a boy, testicles start to move down into his scrotum. Sixth month  The lungs are formed, but the fetus is not yet able to breathe.  The eyes open. The brain continues to develop.  Your baby has fingerprints and toe prints. Your baby's hair grows thicker.  At the end of the second trimester, the fetus is about 9 in (22.9 cm) long. Seventh month  The fetus kicks and stretches.  The eyes are developed enough to sense changes in light.  The hands can make a grasping motion.  The fetus responds to sound. Eighth month  All organs and body systems are fully developed and functioning.  Bones harden and taste buds develop. The fetus may hiccup.  Certain areas of the brain are still developing. The skull remains soft. Ninth month  The fetus gains about  lb (0.23 kg) each week.  The lungs are fully developed.  Patterns of sleep develop.  The fetus's head  typically moves into a head-down position (vertex) in the uterus to prepare for birth. If the buttocks move into a vertex position instead, the baby is breech.  The fetus weighs 6-9 lbs (2.72-4.08 kg) and is 19-20 in (48.26-50.8 cm) long. What can I do to have a healthy pregnancy and help my baby develop?  Eating and Drinking  Eat a healthy diet.  Talk with your health care provider to make sure that you are getting the nutrients that you and your baby need.  Visit www.DisposableNylon.be to learn about creating a healthy diet.  Gain a healthy amount of weight during pregnancy as advised by your health care provider. This is usually 25-35 pounds. You may need to:  Gain more if you were underweight before getting pregnant or if you  are pregnant with more than one baby.  Gain less if you were overweight or obese when you got pregnant. Medicines and Vitamins  Take prenatal vitamins as directed by your health care provider. These include vitamins such as folic acid, iron, calcium, and vitamin D. They are important for healthy development.  Take medicines only as directed by your health care provider. Read labels and ask a pharmacist or your health care provider whether over-the-counter medicines, supplements, and prescription drugs are safe to take during pregnancy. Activities  Be physically active as advised by your health care provider. Ask your health care provider to recommend activities that are safe for you to do, such as walking or swimming.  Do not participate in strenuous or extreme sports. Lifestyle  Do not drink alcohol.  Do not use any tobacco products, including cigarettes, chewing tobacco, or electronic cigarettes. If you need help quitting, ask your health care provider.  Do not use illegal drugs. Safety  Avoid exposure to mercury, lead, or other heavy metals. Ask your health care provider about common sources of these heavy metals.  Avoid listeria infection during  pregnancy. Follow these precautions:  Do not eat soft cheeses or deli meats.  Do not eat hot dogs unless they have been warmed up to the point of steaming, such as in the microwave oven.  Do not drink unpasteurized milk.  Avoid toxoplasmosis infection during pregnancy. Follow these precautions:  Do not change your cat's litter box, if you have a cat. Ask someone else to do this for you.  Wear gardening gloves while working in the yard. General Instructions  Keep all follow-up visits as directed by your health care provider. This is important. This includes prenatal care and screening tests.  Manage any chronic health conditions. Work closely with your health care provider to keep conditions, such as diabetes, under control. How do I know if my baby is developing well? At each prenatal visit, your health care provider will do several different tests to check on your health and keep track of your baby's development. These include:  Fundal height.  Your health care provider will measure your growing belly from top to bottom using a tape measure.  Your health care provider will also feel your belly to determine your baby's position.  Heartbeat.  An ultrasound in the first trimester can confirm pregnancy and show a heartbeat, depending on how far along you are.  Your health care provider will check your baby's heart rate at every prenatal visit.  As you get closer to your delivery date, you may have regular fetal heart rate monitoring to make sure that your baby is not in distress.  Second trimester ultrasound.  This ultrasound checks your baby's development. It also indicates your baby's gender. What should I do if I have concerns about my baby's development? Always talk with your health care provider about any concerns that you may have. This information is not intended to replace advice given to you by your health care provider. Make sure you discuss any questions you have with  your health care provider. Document Released: 12/26/2007 Document Revised: 12/15/2015 Document Reviewed: 12/16/2013 Elsevier Interactive Patient Education  2017 [employer]

## 2016-10-18 NOTE — Progress Notes (Signed)
   PRENATAL VISIT NOTE  Subjective:  Yesenia Weeks is a 41 y.o. [id number] at 7067w6d being seen today for ongoing prenatal care.  She is currently monitored for the following issues for this high-risk pregnancy and has ANEMIA, IRON DEFICIENCY, UNSPEC.; Mood disorder (HCC); TOBACCO USER; RHINITIS, ALLERGIC; ACNE, MILD; ASTEATOTIC ECZEMA; HALLUX VALGUS, ACQUIRED; Genital herpes; Family history of breast cancer; Anxiety state; GBS bacteriuria; AMA (advanced maternal age) multigravida 35+; 3318 weeks gestation of pregnancy; and Supervision of low-risk pregnancy, second trimester on her problem list.  Patient reports Patient is complaining of some vaginal itching. She reprots she shaves on a regualr basis and the irritation started mostly after she shaves. Also complaining of some cramping and pressure that comes and goes. She has no vaginal bleeding in the last couple of weeks. .  Contractions: Irregular. Vag. Bleeding: None.  Movement: Present. Denies leaking of fluid.   The following portions of the patient's history were reviewed and updated as appropriate: allergies, current medications, past family history, past medical history, past social history, past surgical history and problem list. Problem list updated.  Objective:   Vitals:   10/18/16 1453  BP: 121/75  Pulse: 92  Weight: 165 lb 12.8 oz (75.2 kg)    Fetal Status: Fetal Heart Rate (bpm): 150   Movement: Present     General:  Alert, oriented and cooperative. Patient is in no acute distress.  Skin: Skin is warm and dry. No rash noted.   Cardiovascular: Normal heart rate noted  Respiratory: Normal respiratory effort, no problems with respiration noted  Abdomen: Soft, gravid, appropriate for gestational age. Pain/Pressure: Present     Pelvic:  Cervical exam performed        Extremities: Normal range of motion.  Edema: None  Mental Status: Normal mood and affect. Normal behavior. Normal judgment and thought content.   Assessment and Plan:    Pregnancy: [id number] at 8167w6d  1. Supervision of low-risk pregnancy, second trimester -patient is AMA genetics have been drawn.  -patient had non visualized spine and heart anatomy, echo ordered and repeat US scheduled -advised increased fluid intake to help with cramping  2. Vaginal itching - Cervicovaginal ancillary only  Preterm labor symptoms and general obstetric precautions including but not limited to vaginal bleeding, contractions, leaking of fluid and fetal movement were reviewed in detail with the patient. Please refer to After Visit Summary for other counseling recommendations.  Return in about 4 weeks (around 11/15/2016).   Lorne SkeensNicholas Michael Wm Sahagun, MD

## 2016-10-19 LAB — CERVICOVAGINAL ANCILLARY ONLY
Bacterial vaginitis: NEGATIVE
Candida vaginitis: POSITIVE — AB
Chlamydia: NEGATIVE
Neisseria Gonorrhea: NEGATIVE
Trichomonas: NEGATIVE

## 2016-10-22 ENCOUNTER — Telehealth: Payer: Self-pay | Admitting: Obstetrics and Gynecology

## 2016-10-22 DIAGNOSIS — B373 Candidiasis of vulva and vagina: Secondary | ICD-10-CM

## 2016-10-22 DIAGNOSIS — B3731 Acute candidiasis of vulva and vagina: Secondary | ICD-10-CM

## 2016-10-22 MED ORDER — TERCONAZOLE 0.4 % VA CREA: 1.0000 | TOPICAL_CREAM | Freq: Every day | VAGINAL | 0 refills | Status: DC

## 2016-10-22 NOTE — Telephone Encounter (Signed)
Patient informed of yeast infection. Prescription sent to pharmacy.

## 2016-10-30 ENCOUNTER — Ambulatory Visit (HOSPITAL_COMMUNITY)
Admission: RE | Admit: 2016-10-30 | Discharge: 2016-10-30 | Disposition: A | Discharge status: Home / Self Care | Payer: Medicaid Other | Source: Ambulatory Visit | Attending: Obstetrics and Gynecology | Admitting: Obstetrics and Gynecology

## 2016-10-30 ENCOUNTER — Other Ambulatory Visit (HOSPITAL_COMMUNITY): Payer: Self-pay | Admitting: *Deleted

## 2016-10-30 ENCOUNTER — Encounter (HOSPITAL_COMMUNITY): Payer: Self-pay

## 2016-10-30 DIAGNOSIS — O99312 Alcohol use complicating pregnancy, second trimester: Secondary | ICD-10-CM | POA: Diagnosis not present

## 2016-10-30 DIAGNOSIS — O09522 Supervision of elderly multigravida, second trimester: Secondary | ICD-10-CM | POA: Diagnosis not present

## 2016-10-30 DIAGNOSIS — Z3A22 22 weeks gestation of pregnancy: Secondary | ICD-10-CM | POA: Diagnosis not present

## 2016-10-30 DIAGNOSIS — O99310 Alcohol use complicating pregnancy, unspecified trimester: Secondary | ICD-10-CM

## 2016-10-30 DIAGNOSIS — O365920 Maternal care for other known or suspected poor fetal growth, second trimester, not applicable or unspecified: Secondary | ICD-10-CM

## 2016-11-14 ENCOUNTER — Ambulatory Visit (INDEPENDENT_AMBULATORY_CARE_PROVIDER_SITE_OTHER): Payer: Medicaid Other | Admitting: Obstetrics and Gynecology

## 2016-11-14 DIAGNOSIS — O09522 Supervision of elderly multigravida, second trimester: Secondary | ICD-10-CM | POA: Diagnosis not present

## 2016-11-14 DIAGNOSIS — Z3492 Encounter for supervision of normal pregnancy, unspecified, second trimester: Secondary | ICD-10-CM

## 2016-11-14 DIAGNOSIS — O98312 Other infections with a predominantly sexual mode of transmission complicating pregnancy, second trimester: Secondary | ICD-10-CM | POA: Diagnosis not present

## 2016-11-14 DIAGNOSIS — A6009 Herpesviral infection of other urogenital tract: Secondary | ICD-10-CM | POA: Diagnosis not present

## 2016-11-14 MED ORDER — CETIRIZINE HCL 10 MG PO TABS: 10.0000 mg | ORAL_TABLET | Freq: Every day | ORAL | 3 refills | Status: DC

## 2016-11-14 NOTE — Progress Notes (Signed)
   PRENATAL VISIT NOTE  Subjective:  Yesenia Weeks is a 41 y.o. [id number] at [redacted]w[redacted]d being seen today for ongoing prenatal care.  She is currently monitored for the following issues for this low-risk pregnancy and has ANEMIA, IRON DEFICIENCY, UNSPEC.; Mood disorder (HCC); RHINITIS, ALLERGIC; ACNE, MILD; ASTEATOTIC ECZEMA; HALLUX VALGUS, ACQUIRED; Genital herpes; Family history of breast cancer; Anxiety state; GBS bacteriuria; AMA (advanced maternal age) multigravida 35+; and Supervision of low-risk pregnancy, second trimester on her problem list.  Patient reports no complaints and Patient does report some increased stress. She had a recent move and is now living in a hotel. Also her daughter was hosptialized today for a kideny infection. .  Contractions: Irregular. Vag. Bleeding: None.  Movement: Present. Denies leaking of fluid.   The following portions of the patient's history were reviewed and updated as appropriate: allergies, current medications, past family history, past medical history, past social history, past surgical history and problem list. Problem list updated.  Objective:   Vitals:   11/14/16 1403  BP: (!) 111/58  Pulse: 88  Weight: 170 lb 4.8 oz (77.2 kg)    Fetal Status: Fetal Heart Rate (bpm): 145   Movement: Present     General:  Alert, oriented and cooperative. Patient is in no acute distress.  Skin: Skin is warm and dry. No rash noted.   Cardiovascular: Normal heart rate noted  Respiratory: Normal respiratory effort, no problems with respiration noted  Abdomen: Soft, gravid, appropriate for gestational age. Pain/Pressure: Present     Pelvic:  Cervical exam deferred        Extremities: Normal range of motion.  Edema: None  Mental Status: Normal mood and affect. Normal behavior. Normal judgment and thought content.   Assessment and Plan:  Pregnancy: [id number] at [redacted]w[redacted]d  1. Supervision of low-risk pregnancy, second trimester Doing well Upto date on screening. 28 wks labs  at next visit.  Patient has been taking zyrtec OTC - will prescribe at this time.  - cetirizine (ZYRTEC ALLERGY) 10 MG tablet; Take 1 tablet (10 mg total) by mouth daily.  Dispense: 30 tablet; Refill: 3  Preterm labor symptoms and general obstetric precautions including but not limited to vaginal bleeding, contractions, leaking of fluid and fetal movement were reviewed in detail with the patient. Please refer to After Visit Summary for other counseling recommendations.  Return for needs early am for fasting 2 hr gtt/28 weeks labs/ obfu.   Lorne Skeens, MD

## 2016-11-14 NOTE — Patient Instructions (Signed)

## 2016-11-27 ENCOUNTER — Ambulatory Visit (HOSPITAL_COMMUNITY)
Admission: RE | Admit: 2016-11-27 | Discharge: 2016-11-27 | Disposition: A | Discharge status: Home / Self Care | Payer: Medicaid Other | Source: Ambulatory Visit | Attending: Obstetrics and Gynecology | Admitting: Obstetrics and Gynecology

## 2016-11-27 ENCOUNTER — Other Ambulatory Visit (HOSPITAL_COMMUNITY): Payer: Self-pay | Admitting: Obstetrics and Gynecology

## 2016-11-27 DIAGNOSIS — O09522 Supervision of elderly multigravida, second trimester: Secondary | ICD-10-CM

## 2016-11-27 DIAGNOSIS — Z3A26 26 weeks gestation of pregnancy: Secondary | ICD-10-CM | POA: Insufficient documentation

## 2016-11-27 DIAGNOSIS — F1010 Alcohol abuse, uncomplicated: Secondary | ICD-10-CM

## 2016-11-27 DIAGNOSIS — O99312 Alcohol use complicating pregnancy, second trimester: Secondary | ICD-10-CM

## 2016-11-27 DIAGNOSIS — O365920 Maternal care for other known or suspected poor fetal growth, second trimester, not applicable or unspecified: Secondary | ICD-10-CM

## 2016-11-28 ENCOUNTER — Other Ambulatory Visit (HOSPITAL_COMMUNITY): Payer: Self-pay | Admitting: *Deleted

## 2016-11-28 DIAGNOSIS — O365930 Maternal care for other known or suspected poor fetal growth, third trimester, not applicable or unspecified: Secondary | ICD-10-CM

## 2016-12-07 ENCOUNTER — Encounter (HOSPITAL_COMMUNITY): Payer: Self-pay

## 2016-12-07 ENCOUNTER — Inpatient Hospital Stay (HOSPITAL_COMMUNITY)
Admission: AD | Admit: 2016-12-07 | Discharge: 2016-12-07 | Disposition: A | Discharge status: Home / Self Care | Payer: Medicaid Other | Source: Ambulatory Visit | Attending: Obstetrics & Gynecology | Admitting: Obstetrics & Gynecology

## 2016-12-07 DIAGNOSIS — O9989 Other specified diseases and conditions complicating pregnancy, childbirth and the puerperium: Secondary | ICD-10-CM | POA: Diagnosis not present

## 2016-12-07 DIAGNOSIS — O26899 Other specified pregnancy related conditions, unspecified trimester: Secondary | ICD-10-CM

## 2016-12-07 DIAGNOSIS — Z3A28 28 weeks gestation of pregnancy: Secondary | ICD-10-CM | POA: Diagnosis not present

## 2016-12-07 DIAGNOSIS — H6692 Otitis media, unspecified, left ear: Secondary | ICD-10-CM | POA: Diagnosis not present

## 2016-12-07 DIAGNOSIS — O26893 Other specified pregnancy related conditions, third trimester: Secondary | ICD-10-CM | POA: Insufficient documentation

## 2016-12-07 DIAGNOSIS — R109 Unspecified abdominal pain: Secondary | ICD-10-CM

## 2016-12-07 LAB — URINALYSIS, ROUTINE W REFLEX MICROSCOPIC
Bilirubin Urine: NEGATIVE
Glucose, UA: NEGATIVE mg/dL
Hgb urine dipstick: NEGATIVE
Ketones, ur: NEGATIVE mg/dL
Leukocytes, UA: NEGATIVE
Nitrite: NEGATIVE
Protein, ur: NEGATIVE mg/dL
Specific Gravity, Urine: 1.018 (ref 1.005–1.030)
pH: 6 (ref 5.0–8.0)

## 2016-12-07 MED ORDER — AMOXICILLIN 500 MG PO CAPS: 500.0000 mg | ORAL_CAPSULE | Freq: Two times a day (BID) | ORAL | 0 refills | Status: DC

## 2016-12-07 NOTE — Progress Notes (Addendum)
G4P2 @ [redacted] wksga. Presents to triage for left ear pain that started Denies LOF or bleeding. + FM. Only here due to ear ache.  EFM applied. VSS, see flow sheet for details.   Provider at bs. FFN done. SVE closed.   1536: discharge instructions given with pt understanding.   1550: Pt left unit via ambulatory picked up by SO

## 2016-12-07 NOTE — Discharge Instructions (Signed)
Otitis Media, Adult Otitis media is redness, soreness, and puffiness (swelling) in the space just behind your eardrum (middle ear). It may be caused by allergies or infection. It often happens along with a cold. Follow these instructions at home:  Take your medicine as told. Finish it even if you start to feel better.  Only take over-the-counter or prescription medicines for pain, discomfort, or fever as told by your doctor.  Follow up with your doctor as told. Contact a doctor if:  You have otitis media only in one ear, or bleeding from your nose, or both.  You notice a lump on your neck.  You are not getting better in 3-5 days.  You feel worse instead of better. Get help right away if:  You have pain that is not helped with medicine.  You have puffiness, redness, or pain around your ear.  You get a stiff neck.  You cannot move part of your face (paralysis).  You notice that the bone behind your ear hurts when you touch it. This information is not intended to replace advice given to you by your health care provider. Make sure you discuss any questions you have with your health care provider. Document Released: 12/26/2007 Document Revised: 12/15/2015 Document Reviewed: 02/03/2013 Elsevier Interactive Patient Education  2017 [employer]. Preterm Labor and Birth Information The normal length of a pregnancy is 39-41 weeks. Preterm labor is when labor starts before 37 completed weeks of pregnancy. What are the risk factors for preterm labor? Preterm labor is more likely to occur in women who:  Have certain infections during pregnancy such as a bladder infection, sexually transmitted infection, or infection inside the uterus (chorioamnionitis).  Have a shorter-than-normal cervix.  Have gone into preterm labor before.  Have had surgery on their cervix.  Are younger than age 80 or older than age 79.  Are African American.  Are pregnant with twins or multiple babies  (multiple gestation).  Take street drugs or smoke while pregnant.  Do not gain enough weight while pregnant.  Became pregnant shortly after having been pregnant. What are the symptoms of preterm labor? Symptoms of preterm labor include:  Cramps similar to those that can happen during a menstrual period. The cramps may happen with diarrhea.  Pain in the abdomen or lower back.  Regular uterine contractions that may feel like tightening of the abdomen.  A feeling of increased pressure in the pelvis.  Increased watery or bloody mucus discharge from the vagina.  Water breaking (ruptured amniotic sac). Why is it important to recognize signs of preterm labor? It is important to recognize signs of preterm labor because babies who are born prematurely may not be fully developed. This can put them at an increased risk for:  Long-term (chronic) heart and lung problems.  Difficulty immediately after birth with regulating body systems, including blood sugar, body temperature, heart rate, and breathing rate.  Bleeding in the brain.  Cerebral palsy.  Learning difficulties.  Death. These risks are highest for babies who are born before 34 weeks of pregnancy. How is preterm labor treated? Treatment depends on the length of your pregnancy, your condition, and the health of your baby. It may involve:  Having a stitch (suture) placed in your cervix to prevent your cervix from opening too early (cerclage).  Taking or being given medicines, such as:  Hormone medicines. These may be given early in pregnancy to help support the pregnancy.  Medicine to stop contractions.  Medicines to help mature the  babys lungs. These may be prescribed if the risk of delivery is high.  Medicines to prevent your baby from developing cerebral palsy. If the labor happens before 34 weeks of pregnancy, you may need to stay in the hospital. What should I do if I think I am in preterm labor? If you think that  you are going into preterm labor, call your health care provider right away. How can I prevent preterm labor in future pregnancies? To increase your chance of having a full-term pregnancy:  Do not use any tobacco products, such as cigarettes, chewing tobacco, and e-cigarettes. If you need help quitting, ask your health care provider.  Do not use street drugs or medicines that have not been prescribed to you during your pregnancy.  Talk with your health care provider before taking any herbal supplements, even if you have been taking them regularly.  Make sure you gain a healthy amount of weight during your pregnancy.  Watch for infection. If you think that you might have an infection, get it checked right away.  Make sure to tell your health care provider if you have gone into preterm labor before. This information is not intended to replace advice given to you by your health care provider. Make sure you discuss any questions you have with your health care provider. Document Released: 09/29/2003 Document Revised: 12/20/2015 Document Reviewed: 11/30/2015 Elsevier Interactive Patient Education  2017 [employer]

## 2016-12-07 NOTE — MAU Note (Signed)
Woke up with pain in left ear.called in, nurse asked her a lot of questions and said she needed to come in .  (baby move 10 times in 2 hrs-pt reports has increased in last 2 wks; going to the bathroom frequently- but small amts, no pain or burning). Pt's only complaint is the ear pain, denies sore throat or fever.

## 2016-12-07 NOTE — MAU Provider Note (Signed)
History     CSN: [id number]  Arrival date and time: 12/07/16 1408   First Provider Initiated Contact with Patient 12/07/16 1501      Chief Complaint  Patient presents with  . Otalgia   Yesenia Weeks is a 41 y.o. [id number] at [redacted]w[redacted]d who presents with ear pain. Symptoms began this morning. Also reports irregular lower abdominal pain with tightening that occurs ~ 4 times per day for the last several days. Denies vaginal bleeding, LOF, recent intercourse. Positive fetal movement. Currently no pain.    Otalgia   There is pain in the left ear. This is a new problem. The current episode started today (this morning). The problem occurs constantly. The problem has been gradually worsening. There has been no fever. The pain is at a severity of 4/10. Associated symptoms include abdominal pain (irregular lower abdominal pain/tightening that occurs 4 times per day) and headaches. Pertinent negatives include no coughing, diarrhea, ear discharge, neck pain, rhinorrhea, sore throat or vomiting. She has tried nothing for the symptoms. There is no history of a chronic ear infection, hearing loss or a tympanostomy tube.   OB History    Gravida Para Term Preterm AB Living   4 3 2 1  0 2   SAB TAB Ectopic Multiple Live Births   0       2      Past Medical History:  Diagnosis Date  . ADHD (attention deficit hyperactivity disorder)   . Allergy   . Anemia    iron def  . Anxiety   . BV (bacterial vaginosis)    recurrent-uses boric acid  . Depression   . Dysmenorrhea   . Substance abuse     Past Surgical History:  Procedure Laterality Date  . HERNIA REPAIR     x2    Family History  Problem Relation Age of Onset  . Cancer Maternal Aunt        breast  . Diabetes Maternal Grandmother   . Hypertension Maternal Grandmother   . Mental illness Maternal Grandmother   . Diabetes Paternal Grandmother   . Kidney disease Paternal Grandmother   . Mental illness Paternal Grandmother     Social History   Substance Use Topics  . Smoking status: Former Smoker    Types: Cigarettes    Quit date: 08/13/2016  . Smokeless tobacco: Never Used  . Alcohol use Yes     Comment: 40 onces of beer a night/stopped after +UPT    Allergies: No Known Allergies  Prescriptions Prior to Admission  Medication Sig Dispense Refill Last Dose  . Prenatal Vit-Fe Fumarate-FA (PRENATAL MULTIVITAMIN) TABS tablet Take 1 tablet by mouth daily at 12 noon.   12/06/2016 at Unknown time  . cetirizine (ZYRTEC ALLERGY) 10 MG tablet Take 1 tablet (10 mg total) by mouth daily. (Patient not taking: Reported on 11/27/2016) 30 tablet 3 Not Taking  . terconazole (TERAZOL 7) 0.4 % vaginal cream Place 1 applicator vaginally at bedtime. (Patient not taking: Reported on 11/27/2016) 45 g 0 12/04/2016    Review of Systems  Constitutional: Negative.   HENT: Positive for congestion and ear pain. Negative for ear discharge, rhinorrhea, sinus pain, sinus pressure, sore throat and tinnitus.   Respiratory: Negative for cough.   Gastrointestinal: Positive for abdominal pain (irregular lower abdominal pain/tightening that occurs 4 times per day). Negative for constipation, diarrhea, nausea and vomiting.  Genitourinary: Negative for dysuria, vaginal bleeding and vaginal discharge.  Musculoskeletal: Negative for neck pain.  Neurological: Positive for  headaches.   Physical Exam   Blood pressure 123/78, pulse 95, temperature 98.3 F (36.8 C), temperature source Oral, resp. rate 18, height 5\' 7"  (1.702 m), weight 173 lb 12 oz (78.8 kg), last menstrual period 07/03/2016, SpO2 100 %.  Physical Exam  Nursing note and vitals reviewed. Constitutional: She is oriented to person, place, and time. She appears well-developed and well-nourished. No distress.  HENT:  Head: Normocephalic and atraumatic.  Right Ear: Tympanic membrane, external ear and ear canal normal.  Left Ear: External ear and ear canal normal. No tenderness. No mastoid tenderness.  Tympanic membrane is erythematous. Tympanic membrane is not retracted and not bulging.  Eyes: Conjunctivae are normal. Right eye exhibits no discharge. Left eye exhibits no discharge. No scleral icterus.  Neck: Normal range of motion.  Cardiovascular: Normal rate, regular rhythm and normal heart sounds.   No murmur heard. Respiratory: Effort normal and breath sounds normal. No respiratory distress. She has no wheezes.  GI: Soft. There is no tenderness.  Neurological: She is alert and oriented to person, place, and time.  Skin: Skin is warm and dry. She is not diaphoretic.  Psychiatric: She has a normal mood and affect. Her behavior is normal. Judgment and thought content normal.   Dilation: Closed Effacement (%): Thick Cervical Position: Posterior Exam by:: Judeth HornErin Ellayna Hilligoss NP  Fetal Tracing:  Baseline: 140 Variability: moderate Accelerations: 10x10 Decelerations: variable x 1  Toco: x 1, ctx vs fetal movement MAU Course  Procedures Results for orders placed or performed during the hospital encounter of 12/07/16 (from the past 24 hour(s))  Urinalysis, Routine w reflex microscopic     Status: None   Collection Time: 12/07/16  2:32 PM  Result Value Ref Range   Color, Urine YELLOW YELLOW   APPearance CLEAR CLEAR   Specific Gravity, Urine 1.018 1.005 - 1.030   pH 6.0 5.0 - 8.0   Glucose, UA NEGATIVE NEGATIVE mg/dL   Hgb urine dipstick NEGATIVE NEGATIVE   Bilirubin Urine NEGATIVE NEGATIVE   Ketones, ur NEGATIVE NEGATIVE mg/dL   Protein, ur NEGATIVE NEGATIVE mg/dL   Nitrite NEGATIVE NEGATIVE   Leukocytes, UA NEGATIVE NEGATIVE    MDM Fetal tracing appropriate for gestation Cervix closed/thick No regular ctx on monitor or palpated VSS, NAD Assessment and Plan  A: 1. Acute ear infection, left   2. Abdominal pain affecting pregnancy    P: Discharge home Rx amoxil Discussed reasons to return to MAU Keep f/u with OB  Judeth HornErin Kleo Dungee 12/07/2016, 3:02 PM

## 2016-12-10 ENCOUNTER — Encounter (HOSPITAL_COMMUNITY): Payer: Self-pay | Admitting: *Deleted

## 2016-12-10 ENCOUNTER — Inpatient Hospital Stay (HOSPITAL_COMMUNITY)
Admission: AD | Admit: 2016-12-10 | Discharge: 2016-12-10 | Disposition: A | Discharge status: Home / Self Care | Payer: Medicaid Other | Source: Ambulatory Visit | Attending: Obstetrics & Gynecology | Admitting: Obstetrics & Gynecology

## 2016-12-10 DIAGNOSIS — Z8249 Family history of ischemic heart disease and other diseases of the circulatory system: Secondary | ICD-10-CM | POA: Diagnosis not present

## 2016-12-10 DIAGNOSIS — Z803 Family history of malignant neoplasm of breast: Secondary | ICD-10-CM | POA: Diagnosis not present

## 2016-12-10 DIAGNOSIS — Z3A28 28 weeks gestation of pregnancy: Secondary | ICD-10-CM | POA: Insufficient documentation

## 2016-12-10 DIAGNOSIS — Z818 Family history of other mental and behavioral disorders: Secondary | ICD-10-CM | POA: Insufficient documentation

## 2016-12-10 DIAGNOSIS — O4703 False labor before 37 completed weeks of gestation, third trimester: Secondary | ICD-10-CM | POA: Diagnosis not present

## 2016-12-10 DIAGNOSIS — O26893 Other specified pregnancy related conditions, third trimester: Secondary | ICD-10-CM | POA: Diagnosis not present

## 2016-12-10 DIAGNOSIS — M549 Dorsalgia, unspecified: Secondary | ICD-10-CM | POA: Diagnosis not present

## 2016-12-10 DIAGNOSIS — Z841 Family history of disorders of kidney and ureter: Secondary | ICD-10-CM | POA: Diagnosis not present

## 2016-12-10 DIAGNOSIS — Z87891 Personal history of nicotine dependence: Secondary | ICD-10-CM | POA: Diagnosis not present

## 2016-12-10 DIAGNOSIS — R109 Unspecified abdominal pain: Secondary | ICD-10-CM | POA: Diagnosis present

## 2016-12-10 DIAGNOSIS — O9989 Other specified diseases and conditions complicating pregnancy, childbirth and the puerperium: Secondary | ICD-10-CM

## 2016-12-10 DIAGNOSIS — Z3689 Encounter for other specified antenatal screening: Secondary | ICD-10-CM

## 2016-12-10 DIAGNOSIS — Z833 Family history of diabetes mellitus: Secondary | ICD-10-CM | POA: Diagnosis not present

## 2016-12-10 DIAGNOSIS — Z8619 Personal history of other infectious and parasitic diseases: Secondary | ICD-10-CM | POA: Diagnosis not present

## 2016-12-10 DIAGNOSIS — Z9889 Other specified postprocedural states: Secondary | ICD-10-CM | POA: Insufficient documentation

## 2016-12-10 LAB — WET PREP, GENITAL
Clue Cells Wet Prep HPF POC: NONE SEEN
Sperm: NONE SEEN
Trich, Wet Prep: NONE SEEN
Yeast Wet Prep HPF POC: NONE SEEN

## 2016-12-10 LAB — URINALYSIS, ROUTINE W REFLEX MICROSCOPIC
Bilirubin Urine: NEGATIVE
Glucose, UA: NEGATIVE mg/dL
Hgb urine dipstick: NEGATIVE
Ketones, ur: NEGATIVE mg/dL
Leukocytes, UA: NEGATIVE
Nitrite: NEGATIVE
Protein, ur: NEGATIVE mg/dL
Specific Gravity, Urine: 1.009 (ref 1.005–1.030)
pH: 7 (ref 5.0–8.0)

## 2016-12-10 MED ORDER — COMFORT FIT MATERNITY SUPP MED MISC: 1.0000 | Freq: Every day | 0 refills | Status: DC

## 2016-12-10 NOTE — Discharge Instructions (Signed)

## 2016-12-10 NOTE — MAU Note (Signed)
Has been hurting all through the night and the morning, contractions.  Has calmed down a little, but still hurting.

## 2016-12-10 NOTE — MAU Provider Note (Signed)
History     CSN: [id number]  Arrival date and time: 12/10/16 1239   First Provider Initiated Contact with Patient 12/10/16 1400      Chief Complaint  Patient presents with  . Abdominal Pain  . Back Pain  . Contractions   [id number] @28 .3 wks here with ctx and pelvic pressure. Ctx have been irregular since yesterday, she hasn't timed them. She started feeling pelvic pressure last night when she tried to have a BM. No VB or LOF. Reports hx of yeast infection this pregnancy but did not complete medication. Denies itching or discharge presently. Reports good FM. No recent IC. Reports good hydration with water.    OB History    Gravida Para Term Preterm AB Living   4 3 2 1  0 2   SAB TAB Ectopic Multiple Live Births   0       2      Past Medical History:  Diagnosis Date  . ADHD (attention deficit hyperactivity disorder)   . Allergy   . Anemia    iron def  . Anxiety   . BV (bacterial vaginosis)    recurrent-uses boric acid  . Depression   . Dysmenorrhea   . Substance abuse     Past Surgical History:  Procedure Laterality Date  . HERNIA REPAIR     x2    Family History  Problem Relation Age of Onset  . Cancer Maternal Aunt        breast  . Diabetes Maternal Grandmother   . Hypertension Maternal Grandmother   . Mental illness Maternal Grandmother   . Diabetes Paternal Grandmother   . Kidney disease Paternal Grandmother   . Mental illness Paternal Grandmother     Social History  Substance Use Topics  . Smoking status: Former Smoker    Types: Cigarettes    Quit date: 08/13/2016  . Smokeless tobacco: Never Used  . Alcohol use Yes     Comment: 40 onces of beer a night/stopped after +UPT    Allergies: No Known Allergies  Prescriptions Prior to Admission  Medication Sig Dispense Refill Last Dose  . amoxicillin (AMOXIL) 500 MG capsule Take 1 capsule (500 mg total) by mouth 2 (two) times daily. 20 capsule 0   . cetirizine (ZYRTEC ALLERGY) 10 MG tablet Take 1  tablet (10 mg total) by mouth daily. (Patient not taking: Reported on 11/27/2016) 30 tablet 3 Not Taking  . Prenatal Vit-Fe Fumarate-FA (PRENATAL MULTIVITAMIN) TABS tablet Take 1 tablet by mouth daily at 12 noon.   12/06/2016 at Unknown time    Review of Systems  Gastrointestinal: Positive for abdominal pain.  Genitourinary: Positive for pelvic pain. Negative for vaginal bleeding and vaginal discharge.  Musculoskeletal: Positive for back pain (low).   Physical Exam   Blood pressure 123/72, pulse 94, temperature 98.4 F (36.9 C), temperature source Oral, resp. rate 18, weight 79.2 kg (174 lb 8 oz), last menstrual period 07/03/2016, SpO2 100 %.  Physical Exam  Constitutional: She is oriented to person, place, and time. She appears well-developed and well-nourished. No distress (appears comfortable).  HENT:  Head: Normocephalic and atraumatic.  Neck: Normal range of motion.  Respiratory: Effort normal.  GI: Soft. She exhibits no distension. There is no tenderness.  gravid  Genitourinary:  Genitourinary Comments: External: no lesions or erythema Vagina: rugated, parous, thick white curdy discharge Cervix closed/long  Musculoskeletal: Normal range of motion.  Neurological: She is alert and oriented to person, place, and time.  Skin: Skin is  warm and dry.  Psychiatric: She has a normal mood and affect.   EFM: 140 bpm, mod variability, + accels, no decels Toco: irregular ctx, irritability  Results for orders placed or performed during the hospital encounter of 12/10/16 (from the past 24 hour(s))  Urinalysis, Routine w reflex microscopic     Status: None   Collection Time: 12/10/16 12:40 PM  Result Value Ref Range   Color, Urine YELLOW YELLOW   APPearance CLEAR CLEAR   Specific Gravity, Urine 1.009 1.005 - 1.030   pH 7.0 5.0 - 8.0   Glucose, UA NEGATIVE NEGATIVE mg/dL   Hgb urine dipstick NEGATIVE NEGATIVE   Bilirubin Urine NEGATIVE NEGATIVE   Ketones, ur NEGATIVE NEGATIVE mg/dL    Protein, ur NEGATIVE NEGATIVE mg/dL   Nitrite NEGATIVE NEGATIVE   Leukocytes, UA NEGATIVE NEGATIVE   MAU Course  Procedures Po hydration  MDM Labs ordered and reviewed. No evidence of PTL or infection. Stable for discharge home.  Assessment and Plan   1. [redacted] weeks gestation of pregnancy   2. Back pain affecting pregnancy in third trimester   3. Preterm uterine contractions in third trimester, antepartum   4. NST (non-stress test) reactive    Discharge home Follow up in [facility] in 2 days as scheduled PTL precautions Increase hydration Rx maternity support belt   Allergies as of 12/10/2016   No Known Allergies     Medication List    TAKE these medications   cetirizine 10 MG tablet Commonly known as:  ZYRTEC ALLERGY Take 1 tablet (10 mg total) by mouth daily.   COMFORT FIT MATERNITY SUPP MED Misc 1 Device by Does not apply route daily.   prenatal multivitamin Tabs tablet Take 1 tablet by mouth daily at 12 noon.      Donette LarryMelanie Lashawndra Lampkins, CNM 12/10/2016, 2:05 PM

## 2016-12-11 LAB — GC/CHLAMYDIA PROBE AMP (~~LOC~~) NOT AT ARMC
Chlamydia: NEGATIVE
Neisseria Gonorrhea: NEGATIVE

## 2016-12-12 ENCOUNTER — Ambulatory Visit (INDEPENDENT_AMBULATORY_CARE_PROVIDER_SITE_OTHER): Payer: Medicaid Other | Admitting: Advanced Practice Midwife

## 2016-12-12 ENCOUNTER — Encounter: Payer: Self-pay | Admitting: Advanced Practice Midwife

## 2016-12-12 ENCOUNTER — Other Ambulatory Visit (HOSPITAL_COMMUNITY)
Admission: RE | Admit: 2016-12-12 | Discharge: 2016-12-12 | Disposition: A | Discharge status: Home / Self Care | Payer: Medicaid Other | Source: Ambulatory Visit | Attending: Advanced Practice Midwife | Admitting: Advanced Practice Midwife

## 2016-12-12 VITALS — BP 123/73 | HR 104 | Wt 174.1 lb

## 2016-12-12 DIAGNOSIS — B373 Candidiasis of vulva and vagina: Secondary | ICD-10-CM | POA: Diagnosis not present

## 2016-12-12 DIAGNOSIS — Z3483 Encounter for supervision of other normal pregnancy, third trimester: Secondary | ICD-10-CM

## 2016-12-12 DIAGNOSIS — Z8619 Personal history of other infectious and parasitic diseases: Secondary | ICD-10-CM | POA: Insufficient documentation

## 2016-12-12 DIAGNOSIS — B3731 Acute candidiasis of vulva and vagina: Secondary | ICD-10-CM

## 2016-12-12 DIAGNOSIS — Z113 Encounter for screening for infections with a predominantly sexual mode of transmission: Secondary | ICD-10-CM

## 2016-12-12 DIAGNOSIS — Z23 Encounter for immunization: Secondary | ICD-10-CM | POA: Diagnosis not present

## 2016-12-12 DIAGNOSIS — Z3493 Encounter for supervision of normal pregnancy, unspecified, third trimester: Secondary | ICD-10-CM

## 2016-12-12 DIAGNOSIS — N898 Other specified noninflammatory disorders of vagina: Secondary | ICD-10-CM | POA: Diagnosis not present

## 2016-12-12 DIAGNOSIS — Z3492 Encounter for supervision of normal pregnancy, unspecified, second trimester: Secondary | ICD-10-CM

## 2016-12-12 MED ORDER — TERCONAZOLE 0.4 % VA CREA: 1.0000 | TOPICAL_CREAM | Freq: Every day | VAGINAL | 0 refills | Status: DC

## 2016-12-12 NOTE — Progress Notes (Signed)
Pt c/o brown to white discharge, thinks may still have yeast infection 28 week labs/Tdap today

## 2016-12-12 NOTE — Patient Instructions (Signed)
Third Trimester of Pregnancy The third trimester is from week 28 through week 40 (months 7 through 9). The third trimester is a time when the unborn baby (fetus) is growing rapidly. At the end of the ninth month, the fetus is about 20 inches in length and weighs 6-10 pounds. Body changes during your third trimester Your body will continue to go through many changes during pregnancy. The changes vary from woman to woman. During the third trimester:  Your weight will continue to increase. You can expect to gain 25-35 pounds (11-16 kg) by the end of the pregnancy.  You may begin to get stretch marks on your hips, abdomen, and breasts.  You may urinate more often because the fetus is moving lower into your pelvis and pressing on your bladder.  You may develop or continue to have heartburn. This is caused by increased hormones that slow down muscles in the digestive tract.  You may develop or continue to have constipation because increased hormones slow digestion and cause the muscles that push waste through your intestines to relax.  You may develop hemorrhoids. These are swollen veins (varicose veins) in the rectum that can itch or be painful.  You may develop swollen, bulging veins (varicose veins) in your legs.  You may have increased body aches in the pelvis, back, or thighs. This is due to weight gain and increased hormones that are relaxing your joints.  You may have changes in your hair. These can include thickening of your hair, rapid growth, and changes in texture. Some women also have hair loss during or after pregnancy, or hair that feels dry or thin. Your hair will most likely return to normal after your baby is born.  Your breasts will continue to grow and they will continue to become tender. A yellow fluid (colostrum) may leak from your breasts. This is the first milk you are producing for your baby.  Your belly button may stick out.  You may notice more swelling in your hands,  face, or ankles.  You may have increased tingling or numbness in your hands, arms, and legs. The skin on your belly may also feel numb.  You may feel short of breath because of your expanding uterus.  You may have more problems sleeping. This can be caused by the size of your belly, increased need to urinate, and an increase in your body's metabolism.  You may notice the fetus "dropping," or moving lower in your abdomen (lightening).  You may have increased vaginal discharge.  You may notice your joints feel loose and you may have pain around your pelvic bone.  What to expect at prenatal visits You will have prenatal exams every 2 weeks until week 36. Then you will have weekly prenatal exams. During a routine prenatal visit:  You will be weighed to make sure you and the baby are growing normally.  Your blood pressure will be taken.  Your abdomen will be measured to track your baby's growth.  The fetal heartbeat will be listened to.  Any test results from the previous visit will be discussed.  You may have a cervical check near your due date to see if your cervix has softened or thinned (effaced).  You will be tested for Group B streptococcus. This happens between 35 and 37 weeks.  Your health care provider may ask you:  What your birth plan is.  How you are feeling.  If you are feeling the baby move.  If you have had   any abnormal symptoms, such as leaking fluid, bleeding, severe headaches, or abdominal cramping.  If you are using any tobacco products, including cigarettes, chewing tobacco, and electronic cigarettes.  If you have any questions.  Other tests or screenings that may be performed during your third trimester include:  Blood tests that check for low iron levels (anemia).  Fetal testing to check the health, activity level, and growth of the fetus. Testing is done if you have certain medical conditions or if there are problems during the  pregnancy.  Nonstress test (NST). This test checks the health of your baby to make sure there are no signs of problems, such as the baby not getting enough oxygen. During this test, a belt is placed around your belly. The baby is made to move, and its heart rate is monitored during movement.  What is false labor? False labor is a condition in which you feel small, irregular tightenings of the muscles in the womb (contractions) that usually go away with rest, changing position, or drinking water. These are called Braxton Hicks contractions. Contractions may last for hours, days, or even weeks before true labor sets in. If contractions come at regular intervals, become more frequent, increase in intensity, or become painful, you should see your health care provider. What are the signs of labor?  Abdominal cramps.  Regular contractions that start at 10 minutes apart and become stronger and more frequent with time.  Contractions that start on the top of the uterus and spread down to the lower abdomen and back.  Increased pelvic pressure and dull back pain.  A watery or bloody mucus discharge that comes from the vagina.  Leaking of amniotic fluid. This is also known as your "water breaking." It could be a slow trickle or a gush. Let your health care provider know if it has a color or strange odor. If you have any of these signs, call your health care provider right away, even if it is before your due date. Follow these instructions at home: Medicines  Follow your health care provider's instructions regarding medicine use. Specific medicines may be either safe or unsafe to take during pregnancy.  Take a prenatal vitamin that contains at least 600 micrograms (mcg) of folic acid.  If you develop constipation, try taking a stool softener if your health care provider approves. Eating and drinking  Eat a balanced diet that includes fresh fruits and vegetables, whole grains, good sources of protein  such as meat, eggs, or tofu, and low-fat dairy. Your health care provider will help you determine the amount of weight gain that is right for you.  Avoid raw meat and uncooked cheese. These carry germs that can cause birth defects in the baby.  If you have low calcium intake from food, talk to your health care provider about whether you should take a daily calcium supplement.  Eat four or five small meals rather than three large meals a day.  Limit foods that are high in fat and processed sugars, such as fried and sweet foods.  To prevent constipation: ? Drink enough fluid to keep your urine clear or pale yellow. ? Eat foods that are high in fiber, such as fresh fruits and vegetables, whole grains, and beans. Activity  Exercise only as directed by your health care provider. Most women can continue their usual exercise routine during pregnancy. Try to exercise for 30 minutes at least 5 days a week. Stop exercising if you experience uterine contractions.  Avoid heavy   lifting.  Do not exercise in extreme heat or humidity, or at high altitudes.  Wear low-heel, comfortable shoes.  Practice good posture.  You may continue to have sex unless your health care provider tells you otherwise. Relieving pain and discomfort  Take frequent breaks and rest with your legs elevated if you have leg cramps or low back pain.  Take warm sitz baths to soothe any pain or discomfort caused by hemorrhoids. Use hemorrhoid cream if your health care provider approves.  Wear a good support bra to prevent discomfort from breast tenderness.  If you develop varicose veins: ? Wear support pantyhose or compression stockings as told by your healthcare provider. ? Elevate your feet for 15 minutes, 3-4 times a day. Prenatal care  Write down your questions. Take them to your prenatal visits.  Keep all your prenatal visits as told by your health care provider. This is important. Safety  Wear your seat belt at  all times when driving.  Make a list of emergency phone numbers, including numbers for family, friends, the hospital, and police and fire departments. General instructions  Avoid cat litter boxes and soil used by cats. These carry germs that can cause birth defects in the baby. If you have a cat, ask someone to clean the litter box for you.  Do not travel far distances unless it is absolutely necessary and only with the approval of your health care provider.  Do not use hot tubs, steam rooms, or saunas.  Do not drink alcohol.  Do not use any products that contain nicotine or tobacco, such as cigarettes and e-cigarettes. If you need help quitting, ask your health care provider.  Do not use any medicinal herbs or unprescribed drugs. These chemicals affect the formation and growth of the baby.  Do not douche or use tampons or scented sanitary pads.  Do not cross your legs for long periods of time.  To prepare for the arrival of your baby: ? Take prenatal classes to understand, practice, and ask questions about labor and delivery. ? Make a trial run to the hospital. ? Visit the hospital and tour the maternity area. ? Arrange for maternity or paternity leave through employers. ? Arrange for family and friends to take care of pets while you are in the hospital. ? Purchase a rear-facing car seat and make sure you know how to install it in your car. ? Pack your hospital bag. ? Prepare the baby's nursery. Make sure to remove all pillows and stuffed animals from the baby's crib to prevent suffocation.  Visit your dentist if you have not gone during your pregnancy. Use a soft toothbrush to brush your teeth and be gentle when you floss. Contact a health care provider if:  You are unsure if you are in labor or if your water has broken.  You become dizzy.  You have mild pelvic cramps, pelvic pressure, or nagging pain in your abdominal area.  You have lower back pain.  You have persistent  nausea, vomiting, or diarrhea.  You have an unusual or bad smelling vaginal discharge.  You have pain when you urinate. Get help right away if:  Your water breaks before 37 weeks.  You have regular contractions less than 5 minutes apart before 37 weeks.  You have a fever.  You are leaking fluid from your vagina.  You have spotting or bleeding from your vagina.  You have severe abdominal pain or cramping.  You have rapid weight loss or weight gain.    You have shortness of breath with chest pain.  You notice sudden or extreme swelling of your face, hands, ankles, feet, or legs.  Your baby makes fewer than 10 movements in 2 hours.  You have severe headaches that do not go away when you take medicine.  You have vision changes. Summary  The third trimester is from week 28 through week 40, months 7 through 9. The third trimester is a time when the unborn baby (fetus) is growing rapidly.  During the third trimester, your discomfort may increase as you and your baby continue to gain weight. You may have abdominal, leg, and back pain, sleeping problems, and an increased need to urinate.  During the third trimester your breasts will keep growing and they will continue to become tender. A yellow fluid (colostrum) may leak from your breasts. This is the first milk you are producing for your baby.  False labor is a condition in which you feel small, irregular tightenings of the muscles in the womb (contractions) that eventually go away. These are called Braxton Hicks contractions. Contractions may last for hours, days, or even weeks before true labor sets in.  Signs of labor can include: abdominal cramps; regular contractions that start at 10 minutes apart and become stronger and more frequent with time; watery or bloody mucus discharge that comes from the vagina; increased pelvic pressure and dull back pain; and leaking of amniotic fluid. This information is not intended to replace advice  given to you by your health care provider. Make sure you discuss any questions you have with your health care provider. Document Released: 07/03/2001 Document Revised: 12/15/2015 Document Reviewed: 09/09/2012 Elsevier Interactive Patient Education  2017 [employer]  

## 2016-12-12 NOTE — Progress Notes (Signed)
   PRENATAL VISIT NOTE  Subjective:  Yesenia Weeks is a 41 y.o. [id number] at 4849w5d being seen today for ongoing prenatal care.  She is currently monitored for the following issues for this high-risk pregnancy and has ANEMIA, IRON DEFICIENCY, UNSPEC.; Mood disorder (HCC); RHINITIS, ALLERGIC; ACNE, MILD; ASTEATOTIC ECZEMA; HALLUX VALGUS, ACQUIRED; Genital herpes; Family history of breast cancer; Anxiety state; GBS bacteriuria; AMA (advanced maternal age) multigravida 35+; and Supervision of low-risk pregnancy, second trimester on her problem list.  Patient reports occasional contractions.  Contractions: Irregular. Vag. Bleeding: None.  Movement: Present. Denies leaking of fluid.   The following portions of the patient's history were reviewed and updated as appropriate: allergies, current medications, past family history, past medical history, past social history, past surgical history and problem list. Problem list updated.  Objective:   Vitals:   12/12/16 0820  BP: 123/73  Pulse: (!) 104  Weight: 174 lb 1.6 oz (79 kg)    Fetal Status: Fetal Heart Rate (bpm): 138   Movement: Present     General:  Alert, oriented and cooperative. Patient is in no acute distress.  Skin: Skin is warm and dry. No rash noted.   Cardiovascular: Normal heart rate noted  Respiratory: Normal respiratory effort, no problems with respiration noted  Abdomen: Soft, gravid, appropriate for gestational age. Pain/Pressure: Present     Pelvic:  Cervical exam deferred        Extremities: Normal range of motion.  Edema: None  Mental Status: Normal mood and affect. Normal behavior. Normal judgment and thought content.   Assessment and Plan:  Pregnancy: [id number] at 6049w5d  1. Supervision of low-risk pregnancy, third trimester  - Glucose Tolerance, 2 Hours w/1 Hour - CBC - RPR - HIV antibody (with reflex)  2. Need for Tdap vaccination      Done - Tdap vaccine greater than or equal to 7yo IM  3. Supervision of low-risk  pregnancy, second trimester   4. Yeast vaginitis      Did not finish Terazol, reordered - Cervicovaginal ancillary only  Preterm labor symptoms and general obstetric precautions including but not limited to vaginal bleeding, contractions, leaking of fluid and fetal movement were reviewed in detail with the patient. Please refer to After Visit Summary for other counseling recommendations.  Return in about 2 weeks (around 12/26/2016) for [facility].   Wynelle BourgeoisMarie Joseandres Mazer, CNM

## 2016-12-13 LAB — HIV ANTIBODY (ROUTINE TESTING W REFLEX): HIV Screen 4th Generation wRfx: NONREACTIVE

## 2016-12-13 LAB — CBC
Hematocrit: 30.8 % — ABNORMAL LOW (ref 34.0–46.6)
Hemoglobin: 9.9 g/dL — ABNORMAL LOW (ref 11.1–15.9)
MCH: 25.8 pg — ABNORMAL LOW (ref 26.6–33.0)
MCHC: 32.1 g/dL (ref 31.5–35.7)
MCV: 80 fL (ref 79–97)
Platelets: 242 10*3/uL (ref 150–379)
RBC: 3.83 x10E6/uL (ref 3.77–5.28)
RDW: 12.8 % (ref 12.3–15.4)
WBC: 11.1 10*3/uL — ABNORMAL HIGH (ref 3.4–10.8)

## 2016-12-13 LAB — CERVICOVAGINAL ANCILLARY ONLY
Bacterial vaginitis: NEGATIVE
Candida vaginitis: NEGATIVE
Trichomonas: NEGATIVE

## 2016-12-13 LAB — GLUCOSE TOLERANCE, 2 HOURS W/ 1HR
Glucose, 1 hour: 147 mg/dL (ref 65–179)
Glucose, 2 hour: 112 mg/dL (ref 65–152)
Glucose, Fasting: 88 mg/dL (ref 65–91)

## 2016-12-13 LAB — RPR: RPR Ser Ql: NONREACTIVE

## 2016-12-19 ENCOUNTER — Other Ambulatory Visit (HOSPITAL_COMMUNITY): Payer: Self-pay | Admitting: *Deleted

## 2016-12-19 ENCOUNTER — Ambulatory Visit (HOSPITAL_COMMUNITY)
Admission: RE | Admit: 2016-12-19 | Discharge: 2016-12-19 | Disposition: A | Discharge status: Home / Self Care | Payer: Medicaid Other | Source: Ambulatory Visit | Attending: Obstetrics and Gynecology | Admitting: Obstetrics and Gynecology

## 2016-12-19 ENCOUNTER — Encounter (HOSPITAL_COMMUNITY): Payer: Self-pay

## 2016-12-19 ENCOUNTER — Other Ambulatory Visit (HOSPITAL_COMMUNITY): Payer: Self-pay | Admitting: Maternal and Fetal Medicine

## 2016-12-19 DIAGNOSIS — O365930 Maternal care for other known or suspected poor fetal growth, third trimester, not applicable or unspecified: Secondary | ICD-10-CM | POA: Diagnosis not present

## 2016-12-19 DIAGNOSIS — Z3A29 29 weeks gestation of pregnancy: Secondary | ICD-10-CM

## 2016-12-19 DIAGNOSIS — O09523 Supervision of elderly multigravida, third trimester: Secondary | ICD-10-CM

## 2016-12-19 DIAGNOSIS — O99310 Alcohol use complicating pregnancy, unspecified trimester: Secondary | ICD-10-CM | POA: Diagnosis not present

## 2016-12-19 DIAGNOSIS — F1099 Alcohol use, unspecified with unspecified alcohol-induced disorder: Secondary | ICD-10-CM | POA: Diagnosis not present

## 2016-12-19 DIAGNOSIS — O365990 Maternal care for other known or suspected poor fetal growth, unspecified trimester, not applicable or unspecified: Secondary | ICD-10-CM | POA: Insufficient documentation

## 2016-12-31 ENCOUNTER — Ambulatory Visit (INDEPENDENT_AMBULATORY_CARE_PROVIDER_SITE_OTHER): Payer: Medicaid Other | Admitting: Obstetrics & Gynecology

## 2016-12-31 ENCOUNTER — Encounter: Payer: Self-pay | Admitting: Obstetrics & Gynecology

## 2016-12-31 DIAGNOSIS — O09522 Supervision of elderly multigravida, second trimester: Secondary | ICD-10-CM | POA: Diagnosis present

## 2016-12-31 NOTE — Progress Notes (Signed)
Patient reports change in fetal movements- feels baby 5 times in an hour  Patient very concerned about baby's lack of growth

## 2016-12-31 NOTE — Patient Instructions (Signed)

## 2016-12-31 NOTE — Progress Notes (Signed)
   PRENATAL VISIT NOTE  Subjective:  Yesenia Weeks is a 41 y.o. [id number] at 7734w3d being seen today for ongoing prenatal care.  She is currently monitored for the following issues for this high-risk pregnancy and has ANEMIA, IRON DEFICIENCY, UNSPEC.; Mood disorder (HCC); RHINITIS, ALLERGIC; ACNE, MILD; ASTEATOTIC ECZEMA; HALLUX VALGUS, ACQUIRED; Genital herpes; Family history of breast cancer; Anxiety state; GBS bacteriuria; AMA (advanced maternal age) multigravida 35+; and Supervision of high risk pregnancy, antepartum on her problem list.  Patient reports no complaints.  Contractions: Irregular. Vag. Bleeding: None.  Movement: Present. Denies leaking of fluid.   The following portions of the patient's history were reviewed and updated as appropriate: allergies, current medications, past family history, past medical history, past social history, past surgical history and problem list. Problem list updated.  Objective:   Vitals:   12/31/16 1409  BP: 125/73  Pulse: 87  Weight: 178 lb (80.7 kg)    Fetal Status: Fetal Heart Rate (bpm): 133 Fundal Height: 31 cm Movement: Present     General:  Alert, oriented and cooperative. Patient is in no acute distress.  Skin: Skin is warm and dry. No rash noted.   Cardiovascular: Normal heart rate noted  Respiratory: Normal respiratory effort, no problems with respiration noted  Abdomen: Soft, gravid, appropriate for gestational age. Pain/Pressure: Present     Pelvic:  Cervical exam deferred        Extremities: Normal range of motion.  Edema: None  Mental Status: Normal mood and affect. Normal behavior. Normal judgment and thought content.   Assessment and Plan:  Pregnancy: [id number] at 2934w3d  1. Elderly multigravida in third trimester Growth US in 9 days  Preterm labor symptoms and general obstetric precautions including but not limited to vaginal bleeding, contractions, leaking of fluid and fetal movement were reviewed in detail with the  patient. Please refer to After Visit Summary for other counseling recommendations.  Return in about 2 weeks (around 01/14/2017).   Scheryl DarterJames Magaline Steinberg, MD

## 2017-01-09 ENCOUNTER — Ambulatory Visit (HOSPITAL_COMMUNITY)
Admission: RE | Admit: 2017-01-09 | Discharge: 2017-01-09 | Disposition: A | Discharge status: Home / Self Care | Payer: Medicaid Other | Source: Ambulatory Visit | Attending: Obstetrics and Gynecology | Admitting: Obstetrics and Gynecology

## 2017-01-09 ENCOUNTER — Encounter (HOSPITAL_COMMUNITY): Payer: Self-pay

## 2017-01-09 DIAGNOSIS — Z3A32 32 weeks gestation of pregnancy: Secondary | ICD-10-CM | POA: Insufficient documentation

## 2017-01-09 DIAGNOSIS — O99313 Alcohol use complicating pregnancy, third trimester: Secondary | ICD-10-CM | POA: Insufficient documentation

## 2017-01-09 DIAGNOSIS — O09523 Supervision of elderly multigravida, third trimester: Secondary | ICD-10-CM

## 2017-01-09 DIAGNOSIS — F1099 Alcohol use, unspecified with unspecified alcohol-induced disorder: Secondary | ICD-10-CM | POA: Diagnosis not present

## 2017-01-09 DIAGNOSIS — O365930 Maternal care for other known or suspected poor fetal growth, third trimester, not applicable or unspecified: Secondary | ICD-10-CM | POA: Insufficient documentation

## 2017-01-10 ENCOUNTER — Other Ambulatory Visit (HOSPITAL_COMMUNITY): Payer: Self-pay | Admitting: *Deleted

## 2017-01-10 DIAGNOSIS — F1010 Alcohol abuse, uncomplicated: Secondary | ICD-10-CM

## 2017-01-10 DIAGNOSIS — O99312 Alcohol use complicating pregnancy, second trimester: Principal | ICD-10-CM

## 2017-01-16 ENCOUNTER — Ambulatory Visit (INDEPENDENT_AMBULATORY_CARE_PROVIDER_SITE_OTHER): Payer: Medicaid Other | Admitting: Obstetrics & Gynecology

## 2017-01-16 VITALS — BP 133/72 | HR 95 | Wt 179.9 lb

## 2017-01-16 DIAGNOSIS — O0990 Supervision of high risk pregnancy, unspecified, unspecified trimester: Secondary | ICD-10-CM

## 2017-01-16 DIAGNOSIS — O09523 Supervision of elderly multigravida, third trimester: Secondary | ICD-10-CM

## 2017-01-16 DIAGNOSIS — O0993 Supervision of high risk pregnancy, unspecified, third trimester: Secondary | ICD-10-CM | POA: Diagnosis not present

## 2017-01-16 NOTE — Progress Notes (Signed)
   PRENATAL VISIT NOTE  Subjective:  Yesenia Weeks is a 41 y.o. [id number] at 7969w5d being seen today for ongoing prenatal care.  She is currently monitored for the following issues for this high-risk pregnancy and has ANEMIA, IRON DEFICIENCY, UNSPEC.; Mood disorder (HCC); RHINITIS, ALLERGIC; ACNE, MILD; ASTEATOTIC ECZEMA; HALLUX VALGUS, ACQUIRED; Genital herpes; Family history of breast cancer; Anxiety state; GBS bacteriuria; AMA (advanced maternal age) multigravida 35+; and Supervision of high risk pregnancy, antepartum on her problem list.  Patient reports no complaints.  Contractions: Irregular. Vag. Bleeding: None.  Movement: Present. Denies leaking of fluid.   The following portions of the patient's history were reviewed and updated as appropriate: allergies, current medications, past family history, past medical history, past social history, past surgical history and problem list. Problem list updated.  Objective:   Vitals:   01/16/17 1304  BP: 133/72  Pulse: 95  Weight: 179 lb 14.4 oz (81.6 kg)    Fetal Status: Fetal Heart Rate (bpm): 140 Fundal Height: 32 cm Movement: Present     General:  Alert, oriented and cooperative. Patient is in no acute distress.  Skin: Skin is warm and dry. No rash noted.   Cardiovascular: Normal heart rate noted  Respiratory: Normal respiratory effort, no problems with respiration noted  Abdomen: Soft, gravid, appropriate for gestational age. Pain/Pressure: Present     Pelvic:  Cervical exam deferred        Extremities: Normal range of motion.  Edema: None  Mental Status: Normal mood and affect. Normal behavior. Normal judgment and thought content.   Assessment and Plan:  Pregnancy: [id number] at 6069w5d  1. Elderly multigravida in third trimester 2. Supervision of high risk pregnancy, antepartum Repeat growth scan scheduled on 01/30/17; last EFW 46% Preterm labor symptoms and general obstetric precautions including but not limited to vaginal bleeding,  contractions, leaking of fluid and fetal movement were reviewed in detail with the patient. Please refer to After Visit Summary for other counseling recommendations.  Return in about 2 weeks (around 01/30/2017) for OB Visit (HOB).   Jaynie CollinsUgonna Anyanwu, MD

## 2017-01-16 NOTE — Patient Instructions (Signed)
Return to clinic for any scheduled appointments or obstetric concerns, or go to [facility] for evaluation  

## 2017-01-30 ENCOUNTER — Other Ambulatory Visit (HOSPITAL_COMMUNITY)
Admission: RE | Admit: 2017-01-30 | Discharge: 2017-01-30 | Disposition: A | Discharge status: Home / Self Care | Payer: Medicaid Other | Source: Ambulatory Visit | Attending: Obstetrics & Gynecology | Admitting: Obstetrics & Gynecology

## 2017-01-30 ENCOUNTER — Ambulatory Visit (HOSPITAL_COMMUNITY)
Admission: RE | Admit: 2017-01-30 | Discharge: 2017-01-30 | Disposition: A | Discharge status: Home / Self Care | Payer: Medicaid Other | Source: Ambulatory Visit | Attending: Obstetrics & Gynecology | Admitting: Obstetrics & Gynecology

## 2017-01-30 ENCOUNTER — Encounter (HOSPITAL_COMMUNITY): Payer: Self-pay

## 2017-01-30 ENCOUNTER — Ambulatory Visit (INDEPENDENT_AMBULATORY_CARE_PROVIDER_SITE_OTHER): Payer: Medicaid Other | Admitting: Obstetrics & Gynecology

## 2017-01-30 ENCOUNTER — Ambulatory Visit (INDEPENDENT_AMBULATORY_CARE_PROVIDER_SITE_OTHER): Payer: Medicaid Other | Admitting: Clinical

## 2017-01-30 ENCOUNTER — Other Ambulatory Visit (HOSPITAL_COMMUNITY): Payer: Self-pay | Admitting: Obstetrics and Gynecology

## 2017-01-30 ENCOUNTER — Other Ambulatory Visit (HOSPITAL_COMMUNITY): Payer: Self-pay | Admitting: *Deleted

## 2017-01-30 VITALS — BP 126/78 | HR 93 | Wt 181.0 lb

## 2017-01-30 DIAGNOSIS — O09523 Supervision of elderly multigravida, third trimester: Secondary | ICD-10-CM | POA: Insufficient documentation

## 2017-01-30 DIAGNOSIS — O99313 Alcohol use complicating pregnancy, third trimester: Secondary | ICD-10-CM | POA: Diagnosis not present

## 2017-01-30 DIAGNOSIS — O365931 Maternal care for other known or suspected poor fetal growth, third trimester, fetus 1: Secondary | ICD-10-CM | POA: Diagnosis not present

## 2017-01-30 DIAGNOSIS — F419 Anxiety disorder, unspecified: Secondary | ICD-10-CM | POA: Diagnosis not present

## 2017-01-30 DIAGNOSIS — O365930 Maternal care for other known or suspected poor fetal growth, third trimester, not applicable or unspecified: Secondary | ICD-10-CM

## 2017-01-30 DIAGNOSIS — Z3A35 35 weeks gestation of pregnancy: Secondary | ICD-10-CM

## 2017-01-30 DIAGNOSIS — F4323 Adjustment disorder with mixed anxiety and depressed mood: Secondary | ICD-10-CM | POA: Diagnosis not present

## 2017-01-30 DIAGNOSIS — O0990 Supervision of high risk pregnancy, unspecified, unspecified trimester: Secondary | ICD-10-CM

## 2017-01-30 DIAGNOSIS — O365990 Maternal care for other known or suspected poor fetal growth, unspecified trimester, not applicable or unspecified: Secondary | ICD-10-CM | POA: Diagnosis present

## 2017-01-30 DIAGNOSIS — O0993 Supervision of high risk pregnancy, unspecified, third trimester: Secondary | ICD-10-CM | POA: Diagnosis present

## 2017-01-30 NOTE — Progress Notes (Signed)
Patient verbally consented to meet with Behavioral Health Clinician about presenting concerns.   

## 2017-01-30 NOTE — Patient Instructions (Signed)
Return to clinic for any scheduled appointments or obstetric concerns, or go to [facility] for evaluation  

## 2017-01-30 NOTE — Progress Notes (Signed)
PRENATAL VISIT NOTE  Subjective:  Yesenia Weeks is a 41 y.o. [id number] at [redacted]w[redacted]d being seen today for ongoing prenatal care.  She is currently monitored for the following issues for this low-risk pregnancy and has ANEMIA, IRON DEFICIENCY, UNSPEC.; Mood disorder (HCC); RHINITIS, ALLERGIC; ACNE, MILD; ASTEATOTIC ECZEMA; HALLUX VALGUS, ACQUIRED; Genital herpes; Family history of breast cancer; Anxiety state; GBS bacteriuria; AMA (advanced maternal age) multigravida 35+; Supervision of high risk pregnancy, antepartum; and Small for gestational age fetus affecting management of mother, third trimester, fetus 1 on her problem list.  Patient reports no complaints.  Contractions: Irregular. Vag. Bleeding: None.  Movement: Present. Denies leaking of fluid.   The following portions of the patient's history were reviewed and updated as appropriate: allergies, current medications, past family history, past medical history, past social history, past surgical history and problem list. Problem list updated.  Objective:   Vitals:   01/30/17 1256  BP: 126/78  Pulse: 93  Weight: 181 lb (82.1 kg)    Fetal Status: Fetal Heart Rate (bpm): 138   Movement: Present  Presentation: Vertex  General:  Alert, oriented and cooperative. Patient is in no acute distress.  Skin: Skin is warm and dry. No rash noted.   Cardiovascular: Normal heart rate noted  Respiratory: Normal respiratory effort, no problems with respiration noted  Abdomen: Soft, gravid, appropriate for gestational age. Pain/Pressure: Present     Pelvic:  Cervical exam performed Dilation: 1 Effacement (%): Thick Station: Ballotable  Extremities: Normal range of motion.  Edema: Trace  Mental Status: Normal mood and affect. Normal behavior. Normal judgment and thought content.   [place] Mfm Fetal Bpp Wo Non Stress  Result Date: 01/30/2017 ----------------------------------------------------------------------  OBSTETRICS REPORT                      (Signed  Final 01/30/2017 01:16 pm) ---------------------------------------------------------------------- Patient Info  ID #:       [id number]                         D.O.B.:   08-24-1975 (41 yrs)  Name:       Yesenia Weeks                   Visit Date:  01/30/2017 08:32 am ---------------------------------------------------------------------- Performed By  Performed By:     Raoul Pitch        Ref. Address:     [facility]                    RDMS                                                             OB/Gyn Clinic                                                             [address]                                                             [place], [place]                                                             [address]  Attending:        Particia Nearing MD       Location:         [facility]  Referred By:      [facility] for                    [facility]                    Healthcare ---------------------------------------------------------------------- Orders   #  Description                                 Code   1  [place] MFM OB FOLLOW UP                         [phone]   2  [place] MFM UA CORD DOPPLER                      76820.02   3  [place] MFM FETAL BPP WO NON STRESS              76819.01  ----------------------------------------------------------------------   #  Ordered By               Order #        Accession #    Episode #   1  MARK NEWMAN              [id number]      [id number]     [id number]   2  MARK NEWMAN              [id number]      [id number]     [id number]   3  MARK NEWMAN              [id number]      [id number]     [id number]  ---------------------------------------------------------------------- Indications   [redacted] weeks gestation of pregnancy                Z3A.79   Advanced maternal age multigravida 38+,        O41.523   third trimester -low risk NIPS   Small for gestational age fetus affecting      O28.5990    management of mother   Alcohol use complicating pregnancy, third      O99.313   trimester  ---------------------------------------------------------------------- OB History  Gravidity:    4         Term:   2         SAB:  1  Living:       2 ---------------------------------------------------------------------- Fetal Evaluation  Num Of Fetuses:     1  Fetal Heart         145  Rate(bpm):  Cardiac Activity:   Observed  Presentation:       Cephalic  Placenta:           Right lateral, above cervical os  P. Cord Insertion:  Previously Visualized  Amniotic Fluid  AFI FV:      Subjectively within normal limits  AFI Sum(cm)     %Tile       Largest Pocket(cm)  18.86           70          5.44  RUQ(cm)       RLQ(cm)       LUQ(cm)        LLQ(cm)  3.4           5.26          5.44           4.76 ---------------------------------------------------------------------- Biometry  BPD:      84.8  mm     G. Age:  34w 1d         16  %    CI:        78.33   %   70 - 86                                                          FL/HC:      21.6   %   20.1 - 22.1  HC:      303.1  mm     G. Age:  33w 5d        < 3  %    HC/AC:      1.04       0.93 - 1.11  AC:      290.3  mm     G. Age:  33w 0d          4  %    FL/BPD:     77.1   %   71 - 87  FL:       65.4  mm     G. Age:  33w 5d          7  %    FL/AC:      22.5   %   20 - 24  HUM:      56.1  mm     G. Age:  32w 5d          9  %  Est. FW:    2186  gm    4 lb 13 oz      15  % ---------------------------------------------------------------------- Gestational Age  LMP:           30w 1d       Date:   07/03/16                 EDD:   04/09/17  U/S Today:     33w 5d  EDD:   03/15/17  Best:          35w 5d    Det. ByMarcella Dubs         EDD:   03/01/17                                      (08/17/16) ---------------------------------------------------------------------- Anatomy  Cranium:               Appears normal         Aortic Arch:             Previously seen  Cavum:                 Previously seen        Ductal Arch:            Previously seen  Ventricles:            Appears normal         Diaphragm:              Previously seen  Choroid Plexus:        Previously seen        Stomach:                Appears normal, left                                                                        sided  Cerebellum:            Previously seen        Abdomen:                Appears normal  Posterior Fossa:       Previously seen        Abdominal Wall:         Previously seen  Nuchal Fold:           Previously seen        Cord Vessels:           Previously seen  Face:                  Orbits and profile     Kidneys:                Appear normal                         previously seen  Lips:                  Previously seen        Bladder:                Appears normal  Thoracic:              Appears normal         Spine:                  Previously seen  Heart:                 Appears normal  Upper Extremities:      Previously seen                         (4CH, axis, and situs  RVOT:                  Previously seen        Lower Extremities:      Previously seen  LVOT:                  Previously seen  Other:  Heels and 5th digit previously visualized. Nasal bone previously          visualized. Open hands previously visualized. ---------------------------------------------------------------------- Doppler - Fetal Vessels  Umbilical Artery   S/D     %tile     RI              PI              PSV    ADFV    RDFV                                                   (cm/s)  2.98       77   0.66             1.08             44.94      No      No ---------------------------------------------------------------------- Impression  SIUP at 35+5 weeks  Cephalic presentation  Normal interval anatomy; anatomic survey complete  Normal amniotic fluid volume  EFW at the 15th %tile; AC at the 4th %tile  UA dopplers were normal for this GA  BPP 8/8  ---------------------------------------------------------------------- Recommendations  Weekly BPPs and UA dopplers  Delivery at 39 weeks ----------------------------------------------------------------------                 Particia Nearing, MD Electronically Signed Final Report   01/30/2017 01:16 pm ----------------------------------------------------------------------  [place] Mfm Ob Follow Up  Result Date: 01/30/2017 ----------------------------------------------------------------------  OBSTETRICS REPORT                      (Signed Final 01/30/2017 01:16 pm) ---------------------------------------------------------------------- Patient Info  ID #:       [id number]                         D.O.B.:   [birth year] (41 yrs)  Name:       Yesenia Weeks                   Visit Date:  01/30/2017 08:32 am ---------------------------------------------------------------------- Performed By  Performed By:     Raoul Pitch        Ref. Address:     [facility]                    RDMS                                                             OB/Gyn Clinic  [address]                                                             [place], [place]                                                             [address]  Attending:        Particia Nearing MD       Location:         [facility]  Referred By:      [facility] for                    [facility]                    Healthcare ---------------------------------------------------------------------- Orders   #  Description                                 Code   1  [place] MFM OB FOLLOW UP                         [phone]   2  [place] MFM UA CORD DOPPLER                      76820.02   3  [place] MFM FETAL BPP WO NON STRESS              76819.01  ----------------------------------------------------------------------   #  Ordered By                Order #        Accession #    Episode #   1  MARK NEWMAN              [id number]      [id number]     [id number]   2  MARK NEWMAN              [id number]      [id number]     [id number]   3  MARK NEWMAN              [id number]      [id number]     [id number]  ---------------------------------------------------------------------- Indications   [redacted] weeks gestation of pregnancy                Z3A.5   Advanced maternal age multigravida 47+,        O60.523   third trimester -low risk NIPS   Small  for gestational age fetus affecting      O36.5990   management of mother   Alcohol use complicating pregnancy, third      O99.313   trimester  ---------------------------------------------------------------------- OB History  Gravidity:    4         Term:   2         SAB:   1  Living:       2 ---------------------------------------------------------------------- Fetal Evaluation  Num Of Fetuses:     1  Fetal Heart         145  Rate(bpm):  Cardiac Activity:   Observed  Presentation:       Cephalic  Placenta:           Right lateral, above cervical os  P. Cord Insertion:  Previously Visualized  Amniotic Fluid  AFI FV:      Subjectively within normal limits  AFI Sum(cm)     %Tile       Largest Pocket(cm)  18.86           70          5.44  RUQ(cm)       RLQ(cm)       LUQ(cm)        LLQ(cm)  3.4           5.26          5.44           4.76 ---------------------------------------------------------------------- Biometry  BPD:      84.8  mm     G. Age:  34w 1d         16  %    CI:        78.33   %   70 - 86                                                          FL/HC:      21.6   %   20.1 - 22.1  HC:      303.1  mm     G. Age:  33w 5d        < 3  %    HC/AC:      1.04       0.93 - 1.11  AC:      290.3  mm     G. Age:  33w 0d          4  %    FL/BPD:     77.1   %   71 - 87  FL:       65.4  mm     G. Age:  33w 5d          7  %    FL/AC:      22.5   %   20 - 24  HUM:      56.1  mm     G. Age:  32w 5d          9  %  Est. FW:    2186  gm    4 lb 13  oz      15  % ---------------------------------------------------------------------- Gestational Age  LMP:           30w 1d  Date:   07/03/16                 EDD:   04/09/17  U/S Today:     33w 5d                                        EDD:   03/15/17  Best:          35w 5d    Det. ByMarcella Dubs         EDD:   03/01/17                                      (08/17/16) ---------------------------------------------------------------------- Anatomy  Cranium:               Appears normal         Aortic Arch:            Previously seen  Cavum:                 Previously seen        Ductal Arch:            Previously seen  Ventricles:            Appears normal         Diaphragm:              Previously seen  Choroid Plexus:        Previously seen        Stomach:                Appears normal, left                                                                        sided  Cerebellum:            Previously seen        Abdomen:                Appears normal  Posterior Fossa:       Previously seen        Abdominal Wall:         Previously seen  Nuchal Fold:           Previously seen        Cord Vessels:           Previously seen  Face:                  Orbits and profile     Kidneys:                Appear normal                         previously seen  Lips:                  Previously seen        Bladder:  Appears normal  Thoracic:              Appears normal         Spine:                  Previously seen  Heart:                 Appears normal         Upper Extremities:      Previously seen                         (4CH, axis, and situs  RVOT:                  Previously seen        Lower Extremities:      Previously seen  LVOT:                  Previously seen  Other:  Heels and 5th digit previously visualized. Nasal bone previously          visualized. Open hands previously visualized. ---------------------------------------------------------------------- Doppler - Fetal Vessels  Umbilical  Artery   S/D     %tile     RI              PI              PSV    ADFV    RDFV                                                   (cm/s)  2.98       77   0.66             1.08             44.94      No      No ---------------------------------------------------------------------- Impression  SIUP at 35+5 weeks  Cephalic presentation  Normal interval anatomy; anatomic survey complete  Normal amniotic fluid volume  EFW at the 15th %tile; AC at the 4th %tile  UA dopplers were normal for this GA  BPP 8/8 ---------------------------------------------------------------------- Recommendations  Weekly BPPs and UA dopplers  Delivery at 39 weeks ----------------------------------------------------------------------                 Particia Nearing, MD Electronically Signed Final Report   01/30/2017 01:16 pm ----------------------------------------------------------------------  [place] Mfm Ob Follow Up  Result Date: 01/09/2017 ----------------------------------------------------------------------  OBSTETRICS REPORT                      (Signed Final 01/09/2017 08:43 am) ---------------------------------------------------------------------- Patient Info  ID #:       [id number]                         D.O.B.:   [birth year] (40 yrs)  Name:       Yesenia Weeks                   Visit Date:  01/09/2017 08:31 am ---------------------------------------------------------------------- Performed By  Performed By:     Ellin Saba        Ref. Address:     [facility]  RDMS                                                             OB/Gyn Clinic                                                             [address]                                                             [place], [place]                                                             [address]  Attending:        Charlsie Merles MD         Location:         [facility]  Referred By:       [facility] for                    [facility]                    Healthcare ---------------------------------------------------------------------- Orders   #  Description                                 Code   1  [place] MFM OB FOLLOW [place]                         60454.09  ----------------------------------------------------------------------   #  Ordered By               Order #        Accession #    Episode #   1  MARK Ezzard Standing              [id number]      [id number]     [id number]  ---------------------------------------------------------------------- Indications   [redacted] weeks gestation of pregnancy                Z3A.41   Advanced maternal age multigravida 19+,        O73.523   third  trimester -low risk NIPS   Maternal alcohol use complicating              O99.310 F10.99   pregnancy, antepartum; normal ECHO   Small for gestational age fetus affecting      37O36.5990   management of mother  ---------------------------------------------------------------------- Fetal Evaluation  Num Of Fetuses:     1  Fetal Heart         158  Rate(bpm):  Cardiac Activity:   Observed  Presentation:       Cephalic  Placenta:           Posterior, above cervical os  P. Cord Insertion:  Previously Visualized  Amniotic Fluid  AFI FV:      Subjectively within normal limits  AFI Sum(cm)     %Tile       Largest Pocket(cm)  12.22           34          4.92  RUQ(cm)       RLQ(cm)       LUQ(cm)        LLQ(cm)  3.14          2.64          1.52           4.92 ---------------------------------------------------------------------- Biometry  BPD:      80.2  mm     G. Age:  32w 1d         28  %    CI:        72.66   %   70 - 86                                                          FL/HC:      21.1   %   19.9 - 21.5  HC:      299.2  mm     G. Age:  33w 1d         24  %    HC/AC:      1.09       0.96 - 1.11  AC:      274.9  mm     G. Age:  31w 4d         20  %    FL/BPD:     78.8   %   71 - 87  FL:       63.2  mm     G. Age:  32w 5d          38  %    FL/AC:      23.0   %   20 - 24  HUM:        56  mm     G. Age:  32w 4d         53  %  CER:      41.1  mm     G. Age:  35w 1d         78  %  Est. FW:    1906  gm      4 lb 3 oz     46  % ---------------------------------------------------------------------- Gestational Age  LMP:           27w 1d  Date:   07/03/16                 EDD:   04/09/17  U/S Today:     32w 3d                                        EDD:   03/03/17  Best:          32w 5d    Det. ByMarcella Dubs         EDD:   03/01/17                                      (08/17/16) ---------------------------------------------------------------------- Anatomy  Cranium:               Appears normal         Aortic Arch:            Previously seen  Cavum:                 Appears normal         Ductal Arch:            Previously seen  Ventricles:            Appears normal         Diaphragm:              Appears normal  Choroid Plexus:        Appears normal         Stomach:                Appears normal, left                                                                        sided  Cerebellum:            Appears normal         Abdomen:                Appears normal  Posterior Fossa:       Appears normal         Abdominal Wall:         Previously seen  Nuchal Fold:           Previously seen        Cord Vessels:           Appears normal (3                                                                        vessel cord)  Face:                  Appears normal         Kidneys:  Appear normal                         (orbits and profile)  Lips:                  Appears normal         Bladder:                Appears normal  Thoracic:              Appears normal         Spine:                  Appears normal  Heart:                 Appears normal         Upper Extremities:      Previously seen                         (4CH, axis, and situs  RVOT:                  Appears normal         Lower Extremities:      Previously seen   LVOT:                  Appears normal  Other:  Female gender. Heels and 5th digit previously visualized. Nasal bone          previously visualized. Open hands previously visualized. ---------------------------------------------------------------------- Cervix Uterus Adnexa  Cervix  Not visualized (advanced GA >29wks)  Left Ovary  Within normal limits.  Right Ovary  Within normal limits. ---------------------------------------------------------------------- Impression  SIUP at 32+5 weeks with low growth percentiles  Normal interval anatomy; anatomic survey complete  Normal amniotic fluid volume  Appropriate interval growth but with percentiles much  improved. EFW at the 46th percentile; [facility] at the 20th ---------------------------------------------------------------------- Recommendations  Given discrepancy will recheck growth in 3 weeks ----------------------------------------------------------------------                 Charlsie Merles, MD Electronically Signed Final Report   01/09/2017 08:43 am ----------------------------------------------------------------------  [place] Mfm Ua Cord Doppler  Result Date: 01/30/2017 ----------------------------------------------------------------------  OBSTETRICS REPORT                      (Signed Final 01/30/2017 01:16 pm) ---------------------------------------------------------------------- Patient Info  ID #:       [id number]                         D.O.B.:   01-27-1976 (41 yrs)  Name:       Yesenia Weeks                   Visit Date:  01/30/2017 08:32 am ---------------------------------------------------------------------- Performed By  Performed By:     Raoul Pitch        Ref. Address:     [facility]                    RDMS  OB[facility]Gyn Clinic                                                             [address]                                                              [place], [place]                                                             [address]  Attending:        Particia Nearing MD       Location:         [facility]  Referred By:      [facility] for                    [facility]                    Healthcare ---------------------------------------------------------------------- Orders   #  Description                                 Code   1  [place] MFM OB FOLLOW UP                         [phone]   2  [place] MFM UA CORD DOPPLER                      76820.02   3  [place] MFM FETAL BPP WO NON STRESS              76819.01  ----------------------------------------------------------------------   #  Ordered By               Order #        Accession #    Episode #   1  MARK NEWMAN              [id number]      [id number]     [id number]   2  MARK NEWMAN              [id number]      [id number]     [id number]   3  MARK NEWMAN              [id number]  [id number]     [id number]  ---------------------------------------------------------------------- Indications   [redacted] weeks gestation of pregnancy                Z3A.40   Advanced maternal age multigravida 16+,        O18.523   third trimester -low risk NIPS   Small for gestational age fetus affecting      O6.5990   management of mother   Alcohol use complicating pregnancy, third      O99.313   trimester  ---------------------------------------------------------------------- OB History  Gravidity:    4         Term:   2         SAB:   1  Living:       2 ---------------------------------------------------------------------- Fetal Evaluation  Num Of Fetuses:     1  Fetal Heart         145  Rate(bpm):  Cardiac Activity:   Observed  Presentation:       Cephalic  Placenta:           Right lateral, above cervical os  P. Cord Insertion:  Previously Visualized  Amniotic Fluid  AFI FV:      Subjectively within normal limits  AFI Sum(cm)     %Tile       Largest Pocket(cm)  18.86           70          5.44   RUQ(cm)       RLQ(cm)       LUQ(cm)        LLQ(cm)  3.4           5.26          5.44           4.76 ---------------------------------------------------------------------- Biometry  BPD:      84.8  mm     G. Age:  34w 1d         16  %    CI:        78.33   %   70 - 86                                                          FL/HC:      21.6   %   20.1 - 22.1  HC:      303.1  mm     G. Age:  33w 5d        < 3  %    HC/AC:      1.04       0.93 - 1.11  AC:      290.3  mm     G. Age:  33w 0d          4  %    FL/BPD:     77.1   %   71 - 87  FL:       65.4  mm     G. Age:  33w 5d          7  %    FL/AC:      22.5   %   20 - 24  HUM:      56.1  mm     G. Age:  32w 5d  9  %  Est. FW:    2186  gm    4 lb 13 oz      15  % ---------------------------------------------------------------------- Gestational Age  LMP:           30w 1d       Date:   07/03/16                 EDD:   04/09/17  U/S Today:     33w 5d                                        EDD:   03/15/17  Best:          35w 5d    Det. ByMarcella Dubs         EDD:   03/01/17                                      (08/17/16) ---------------------------------------------------------------------- Anatomy  Cranium:               Appears normal         Aortic Arch:            Previously seen  Cavum:                 Previously seen        Ductal Arch:            Previously seen  Ventricles:            Appears normal         Diaphragm:              Previously seen  Choroid Plexus:        Previously seen        Stomach:                Appears normal, left                                                                        sided  Cerebellum:            Previously seen        Abdomen:                Appears normal  Posterior Fossa:       Previously seen        Abdominal Wall:         Previously seen  Nuchal Fold:           Previously seen        Cord Vessels:           Previously seen  Face:                  Orbits and profile     Kidneys:                Appear  normal  previously seen  Lips:                  Previously seen        Bladder:                Appears normal  Thoracic:              Appears normal         Spine:                  Previously seen  Heart:                 Appears normal         Upper Extremities:      Previously seen                         (4CH, axis, and situs  RVOT:                  Previously seen        Lower Extremities:      Previously seen  LVOT:                  Previously seen  Other:  Heels and 5th digit previously visualized. Nasal bone previously          visualized. Open hands previously visualized. ---------------------------------------------------------------------- Doppler - Fetal Vessels  Umbilical Artery   S/D     %tile     RI              PI              PSV    ADFV    RDFV                                                   (cm/s)  2.98       77   0.66             1.08             44.94      No      No ---------------------------------------------------------------------- Impression  SIUP at 35+5 weeks  Cephalic presentation  Normal interval anatomy; anatomic survey complete  Normal amniotic fluid volume  EFW at the 15th %tile; AC at the 4th %tile  UA dopplers were normal for this GA  BPP 8/8 ---------------------------------------------------------------------- Recommendations  Weekly BPPs and UA dopplers  Delivery at 39 weeks ----------------------------------------------------------------------                 Particia Nearing, MD Electronically Signed Final Report   01/30/2017 01:16 pm ----------------------------------------------------------------------   Assessment and Plan:  Pregnancy: [id number] at [redacted]w[redacted]d  1. Small for gestational age fetus affecting management of mother, third trimester, fetus 1 2. Elderly multigravida in third trimester Weekly BPP then IOL at 39 weeks.  BPP to be scheduled by MFM.   3. Anxiety Patient and/or legal guardian verbally consented to meet with Behavioral Health  Clinician about presenting concerns. - Ambulatory referral to Integrated Behavioral Health  4. Supervision of high risk pregnancy, antepartum Has GBS bacteruria, will be treated in labor - GC/Chlamydia probe amp ([place])not at [facility] Term labor symptoms and general obstetric precautions including but not limited to vaginal bleeding, contractions, leaking of  fluid and fetal movement were reviewed in detail with the patient. Please refer to After Visit Summary for other counseling recommendations.  Return in about 1 week (around 02/06/2017) for OB Visit (HOB).   Jaynie Collins, MD

## 2017-01-30 NOTE — BH Specialist Note (Signed)
Integrated Behavioral Health Initial Visit  MRN: [id number] Name: Yesenia Weeks   Session Start time: 1:30 Session End time: 1:55 Total time: 30 minutes  Type of Service: Integrated Behavioral Health- Individual/Family Interpretor:No. Interpretor Name and Language: n/a   Warm Hand Off Completed.       SUBJECTIVE: Yesenia Weeks is a 41 y.o. female accompanied by patient. Patient was referred by Dr Macon LargeAnyanwu for depression, anxiety. Patient reports the following symptoms/concerns: Pt states her primary concern is adjusting to current pregnancy, along with life stressors related to finances, housing, and concern about family history of behavioral health issues; pt says it helps to cope by talking aloud her problems. Duration of problem: Current pregnancy; Severity of problem: moderate  OBJECTIVE: Mood: Normal and Affect: Appropriate Risk of harm to self or others: No plan to harm self or others   LIFE CONTEXT: Family and Social: Lives with 14yo; boyfriend and cousin very supportive School/Work: - Self-Care: - Life Changes: Current unexpected pregnancy, "starting over", unstable housing  GOALS ADDRESSED: Patient will reduce symptoms of: anxiety, depression and stress and increase knowledge and/or ability of: coping skills and also: Increase healthy adjustment to current life circumstances and Increase adequate support systems for patient/family   INTERVENTIONS: Solution-Focused Strategies, Psychoeducation and/or Health Education and Link to [employer]  Standardized Assessments completed: GAD-7 and PHQ 9  ASSESSMENT: Patient currently experiencing Adjustment disorder with mixed anxious and depressed mood. Patient may benefit from psychoeducation and brief therapeutic intervention regarding coping with symptoms of anxiety and depression.  PLAN: 1. Follow up with behavioral health clinician on : Two weeks 2. Behavioral recommendations:  -Daily 20 minute walk outside   -Read educational material regarding coping with symptoms of anxiety and depression; Postpartum Planner -Mom Talk/Breastfeeding support groups Tuesdays at 10/11am, starting postpartum, [facility] for additional social support -Consider apps for additional self-coping strategy -Consider community resources for additional support 3. Referral(s): Integrated [facility]al Health Services (In Clinic) and [employer] Resources:  Transportation and [employer], support groups, [facility] 4. "From scale of 1-10, how likely are you to follow plan?": 8  Rae LipsJamie C Marcia Hartwell, LCSWA   Dep[facility] screen [facility] 01/30/2017 12/31/2016 11/14/2016 10/18/2016 09/21/2016  Decreased Interest 1 1 1  0 1  Down, Depressed, Hopeless 1 1 1  0 1  PHQ - 2 Score 2 2 2  0 2  Altered sleeping 1 1 1 1 1   Tired, decreased energy 2 2 1 2 2   Change in appetite 2 0 1 2 1   Feeling bad or failure about yourself  2 0 1 0 1  Trouble concentrating 1 0 1 0 3  Moving slowly or fidgety/restless 0 0 1 0 1  Suicidal thoughts 0 0 0 0 0  PHQ-9 Score 10 5 8 5 11    GAD 7 : Generalized Anxiety Score 01/30/2017 12/31/2016 11/14/2016 10/18/2016  Nervous, Anxious, on Edge 1 1 1  0  Control/stop worrying 1 1 1 2   Worry too much - different things 1 1 1 2   Trouble relaxing 1 1 2 2   Restless 2 1 2 1   Easily annoyed or irritable 1 1 1 1   Afraid - awful might happen 1 1 1 1   Total GAD 7 Score 8 7 9  9

## 2017-01-31 LAB — GC/CHLAMYDIA PROBE AMP (~~LOC~~) NOT AT ARMC
Chlamydia: NEGATIVE
Neisseria Gonorrhea: NEGATIVE

## 2017-02-06 ENCOUNTER — Other Ambulatory Visit (HOSPITAL_COMMUNITY): Payer: Self-pay | Admitting: *Deleted

## 2017-02-06 ENCOUNTER — Ambulatory Visit (HOSPITAL_COMMUNITY)
Admission: RE | Admit: 2017-02-06 | Discharge: 2017-02-06 | Disposition: A | Discharge status: Home / Self Care | Payer: Medicaid Other | Source: Ambulatory Visit | Attending: Obstetrics & Gynecology | Admitting: Obstetrics & Gynecology

## 2017-02-06 ENCOUNTER — Other Ambulatory Visit (HOSPITAL_COMMUNITY): Payer: Self-pay | Admitting: Maternal and Fetal Medicine

## 2017-02-06 ENCOUNTER — Encounter (HOSPITAL_COMMUNITY): Payer: Self-pay

## 2017-02-06 ENCOUNTER — Ambulatory Visit: Payer: Self-pay

## 2017-02-06 ENCOUNTER — Ambulatory Visit (INDEPENDENT_AMBULATORY_CARE_PROVIDER_SITE_OTHER): Payer: Medicaid Other | Admitting: Obstetrics and Gynecology

## 2017-02-06 VITALS — BP 129/76 | HR 82 | Wt 184.1 lb

## 2017-02-06 DIAGNOSIS — O0993 Supervision of high risk pregnancy, unspecified, third trimester: Secondary | ICD-10-CM

## 2017-02-06 DIAGNOSIS — O99313 Alcohol use complicating pregnancy, third trimester: Secondary | ICD-10-CM | POA: Diagnosis not present

## 2017-02-06 DIAGNOSIS — O09523 Supervision of elderly multigravida, third trimester: Secondary | ICD-10-CM

## 2017-02-06 DIAGNOSIS — Z3A36 36 weeks gestation of pregnancy: Secondary | ICD-10-CM

## 2017-02-06 DIAGNOSIS — O365931 Maternal care for other known or suspected poor fetal growth, third trimester, fetus 1: Secondary | ICD-10-CM

## 2017-02-06 DIAGNOSIS — A6000 Herpesviral infection of urogenital system, unspecified: Secondary | ICD-10-CM

## 2017-02-06 DIAGNOSIS — R8271 Bacteriuria: Secondary | ICD-10-CM | POA: Diagnosis not present

## 2017-02-06 DIAGNOSIS — O365930 Maternal care for other known or suspected poor fetal growth, third trimester, not applicable or unspecified: Secondary | ICD-10-CM

## 2017-02-06 DIAGNOSIS — O0990 Supervision of high risk pregnancy, unspecified, unspecified trimester: Secondary | ICD-10-CM

## 2017-02-06 MED ORDER — VALACYCLOVIR HCL 500 MG PO TABS: 500.0000 mg | ORAL_TABLET | Freq: Two times a day (BID) | ORAL | 6 refills | Status: DC

## 2017-02-06 NOTE — Patient Instructions (Signed)
Vaginal Delivery Vaginal delivery means that you will give birth by pushing your baby out of your birth canal (vagina). A team of health care providers will help you before, during, and after vaginal delivery. Birth experiences are unique for every woman and every pregnancy, and birth experiences vary depending on where you choose to give birth. What should I do to prepare for my baby's birth? Before your baby is born, it is important to talk with your health care provider about:  Your labor and delivery preferences. These may include: ? Medicines that you may be given. ? How you will manage your pain. This might include non-medical pain relief techniques or injectable pain relief such as epidural analgesia. ? How you and your baby will be monitored during labor and delivery. ? Who may be in the labor and delivery room with you. ? Your feelings about surgical delivery of your baby (cesarean delivery, or C-section) if this becomes necessary. ? Your feelings about receiving donated blood through an IV tube (blood transfusion) if this becomes necessary.  Whether you are able: ? To take pictures or videos of the birth. ? To eat during labor and delivery. ? To move around, walk, or change positions during labor and delivery.  What to expect after your baby is born, such as: ? Whether delayed umbilical cord clamping and cutting is offered. ? Who will care for your baby right after birth. ? Medicines or tests that may be recommended for your baby. ? Whether breastfeeding is supported in your hospital or birth center. ? How long you will be in the hospital or birth center.  How any medical conditions you have may affect your baby or your labor and delivery experience.  To prepare for your baby's birth, you should also:  Attend all of your health care visits before delivery (prenatal visits) as recommended by your health care provider. This is important.  Prepare your home for your baby's  arrival. Make sure that you have: ? Diapers. ? Baby clothing. ? Feeding equipment. ? Safe sleeping arrangements for you and your baby.  Install a car seat in your vehicle. Have your car seat checked by a certified car seat installer to make sure that it is installed safely.  Think about who will help you with your new baby at home for at least the first several weeks after delivery.  What can I expect when I arrive at the birth center or hospital? Once you are in labor and have been admitted into the hospital or birth center, your health care provider may:  Review your pregnancy history and any concerns you have.  Insert an IV tube into one of your veins. This is used to give you fluids and medicines.  Check your blood pressure, pulse, temperature, and heart rate (vital signs).  Check whether your bag of water (amniotic sac) has broken (ruptured).  Talk with you about your birth plan and discuss pain control options.  Monitoring Your health care provider may monitor your contractions (uterine monitoring) and your baby's heart rate (fetal monitoring). You may need to be monitored:  Often, but not continuously (intermittently).  All the time or for long periods at a time (continuously). Continuous monitoring may be needed if: ? You are taking certain medicines, such as medicine to relieve pain or make your contractions stronger. ? You have pregnancy or labor complications.  Monitoring may be done by:  Placing a special stethoscope or a handheld monitoring device on your abdomen to   check your baby's heartbeat, and feeling your abdomen for contractions. This method of monitoring does not continuously record your baby's heartbeat or your contractions.  Placing monitors on your abdomen (external monitors) to record your baby's heartbeat and the frequency and length of contractions. You may not have to wear external monitors all the time.  Placing monitors inside of your uterus  (internal monitors) to record your baby's heartbeat and the frequency, length, and strength of your contractions. ? Your health care provider may use internal monitors if he or she needs more information about the strength of your contractions or your baby's heart rate. ? Internal monitors are put in place by passing a thin, flexible wire through your vagina and into your uterus. Depending on the type of monitor, it may remain in your uterus or on your baby's head until birth. ? Your health care provider will discuss the benefits and risks of internal monitoring with you and will ask for your permission before inserting the monitors.  Telemetry. This is a type of continuous monitoring that can be done with external or internal monitors. Instead of having to stay in bed, you are able to move around during telemetry. Ask your health care provider if telemetry is an option for you.  Physical exam Your health care provider may perform a physical exam. This may include:  Checking whether your baby is positioned: ? With the head toward your vagina (head-down). This is most common. ? With the head toward the top of your uterus (head-up or breech). If your baby is in a breech position, your health care provider may try to turn your baby to a head-down position so you can deliver vaginally. If it does not seem that your baby can be born vaginally, your provider may recommend surgery to deliver your baby. In rare cases, you may be able to deliver vaginally if your baby is head-up (breech delivery). ? Lying sideways (transverse). Babies that are lying sideways cannot be delivered vaginally.  Checking your cervix to determine: ? Whether it is thinning out (effacing). ? Whether it is opening up (dilating). ? How low your baby has moved into your birth canal.  What are the three stages of labor and delivery?  Normal labor and delivery is divided into the following three stages: Stage 1  Stage 1 is the  longest stage of labor, and it can last for hours or days. Stage 1 includes: ? Early labor. This is when contractions may be irregular, or regular and mild. Generally, early labor contractions are more than 10 minutes apart. ? Active labor. This is when contractions get longer, more regular, more frequent, and more intense. ? The transition phase. This is when contractions happen very close together, are very intense, and may last longer than during any other part of labor.  Contractions generally feel mild, infrequent, and irregular at first. They get stronger, more frequent (about every 2-3 minutes), and more regular as you progress from early labor through active labor and transition.  Many women progress through stage 1 naturally, but you may need help to continue making progress. If this happens, your health care provider may talk with you about: ? Rupturing your amniotic sac if it has not ruptured yet. ? Giving you medicine to help make your contractions stronger and more frequent.  Stage 1 ends when your cervix is completely dilated to 4 inches (10 cm) and completely effaced. This happens at the end of the transition phase. Stage 2  Once   your cervix is completely effaced and dilated to 4 inches (10 cm), you may start to feel an urge to push. It is common for the body to naturally take a rest before feeling the urge to push, especially if you received an epidural or certain other pain medicines. This rest period may last for up to 1-2 hours, depending on your unique labor experience.  During stage 2, contractions are generally less painful, because pushing helps relieve contraction pain. Instead of contraction pain, you may feel stretching and burning pain, especially when the widest part of your baby's head passes through the vaginal opening (crowning).  Your health care provider will closely monitor your pushing progress and your baby's progress through the vagina during stage 2.  Your  health care provider may massage the area of skin between your vaginal opening and anus (perineum) or apply warm compresses to your perineum. This helps it stretch as the baby's head starts to crown, which can help prevent perineal tearing. ? In some cases, an incision may be made in your perineum (episiotomy) to allow the baby to pass through the vaginal opening. An episiotomy helps to make the opening of the vagina larger to allow more room for the baby to fit through.  It is very important to breathe and focus so your health care provider can control the delivery of your baby's head. Your health care provider may have you decrease the intensity of your pushing, to help prevent perineal tearing.  After delivery of your baby's head, the shoulders and the rest of the body generally deliver very quickly and without difficulty.  Once your baby is delivered, the umbilical cord may be cut right away, or this may be delayed for 1-2 minutes, depending on your baby's health. This may vary among health care providers, hospitals, and birth centers.  If you and your baby are healthy enough, your baby may be placed on your chest or abdomen to help maintain the baby's temperature and to help you bond with each other. Some mothers and babies start breastfeeding at this time. Your health care team will dry your baby and help keep your baby warm during this time.  Your baby may need immediate care if he or she: ? Showed signs of distress during labor. ? Has a medical condition. ? Was born too early (prematurely). ? Had a bowel movement before birth (meconium). ? Shows signs of difficulty transitioning from being inside the uterus to being outside of the uterus. If you are planning to breastfeed, your health care team will help you begin a feeding. Stage 3  The third stage of labor starts immediately after the birth of your baby and ends after you deliver the placenta. The placenta is an organ that develops  during pregnancy to provide oxygen and nutrients to your baby in the womb.  Delivering the placenta may require some pushing, and you may have mild contractions. Breastfeeding can stimulate contractions to help you deliver the placenta.  After the placenta is delivered, your uterus should tighten (contract) and become firm. This helps to stop bleeding in your uterus. To help your uterus contract and to control bleeding, your health care provider may: ? Give you medicine by injection, through an IV tube, by mouth, or through your rectum (rectally). ? Massage your abdomen or perform a vaginal exam to remove any blood clots that are left in your uterus. ? Empty your bladder by placing a thin, flexible tube (catheter) into your bladder. ? Encourage   you to breastfeed your baby. After labor is over, you and your baby will be monitored closely to ensure that you are both healthy until you are ready to go home. Your health care team will teach you how to care for yourself and your baby. This information is not intended to replace advice given to you by your health care provider. Make sure you discuss any questions you have with your health care provider. Document Released: 04/17/2008 Document Revised: 01/27/2016 Document Reviewed: 07/24/2015 Elsevier Interactive Patient Education  2018 [employer]  

## 2017-02-06 NOTE — Progress Notes (Signed)
Subjective:  Yesenia Weeks is a 41 y.o. [id number] at 3247w5d being seen today for ongoing prenatal care.  She is currently monitored for the following issues for this high-risk pregnancy and has ANEMIA, IRON DEFICIENCY, UNSPEC.; Mood disorder (HCC); RHINITIS, ALLERGIC; ACNE, MILD; ASTEATOTIC ECZEMA; HALLUX VALGUS, ACQUIRED; Genital herpes; Family history of breast cancer; Anxiety state; GBS bacteriuria; AMA (advanced maternal age) multigravida 35+; Supervision of high risk pregnancy, antepartum; and Small for gestational age fetus affecting management of mother, third trimester, fetus 1 on her problem list.  Patient reports occasional contractions.  Contractions: Irregular.  .  Movement: Present. Denies leaking of fluid.   The following portions of the patient's history were reviewed and updated as appropriate: allergies, current medications, past family history, past medical history, past social history, past surgical history and problem list. Problem list updated.  Objective:   Vitals:   02/06/17 0957  BP: 129/76  Pulse: 82  Weight: 184 lb 1.6 oz (83.5 kg)    Fetal Status: Fetal Heart Rate (bpm): 128   Movement: Present     General:  Alert, oriented and cooperative. Patient is in no acute distress.  Skin: Skin is warm and dry. No rash noted.   Cardiovascular: Normal heart rate noted  Respiratory: Normal respiratory effort, no problems with respiration noted  Abdomen: Soft, gravid, appropriate for gestational age. Pain/Pressure: Present     Pelvic:  Cervical exam performed        Extremities: Normal range of motion.  Edema: Trace  Mental Status: Normal mood and affect. Normal behavior. Normal judgment and thought content.   Urinalysis:      Assessment and Plan:  Pregnancy: [id number] at 547w5d  1. Supervision of high risk pregnancy, antepartum Labor precautions IOL at 39 weeks  2. Small for gestational age fetus affecting management of mother, third trimester, fetus 1 Continue with  weekly BPP BPP today  3. GBS bacteriuria Tx while in labor  4. Genital herpes simplex, unspecified site Will begin suppressive therapy  5. Elderly multigravida in third trimester Stable  Term labor symptoms and general obstetric precautions including but not limited to vaginal bleeding, contractions, leaking of fluid and fetal movement were reviewed in detail with the patient. Please refer to After Visit Summary for other counseling recommendations.  Return in about 1 week (around 02/13/2017) for OB visit.   Hermina StaggersErvin, Tattiana Fakhouri L, MD

## 2017-02-06 NOTE — Progress Notes (Signed)
IOL scheduled for August 3rd @ midnight.  Pt notified.

## 2017-02-06 NOTE — BH Specialist Note (Deleted)
Integrated Behavioral Health Follow Up Visit  MRN: [id number] Name: Yesenia Weeks   Session Start time: *** Session End time: *** Total time: {IBH Total Time:21014050} Number of Integrated Behavioral Health Clinician visits: 2/10  Type of Service: Integrated Behavioral Health- Individual/Family Interpretor:No. Interpretor Name and Language: n/a   Warm Hand Off Completed.       SUBJECTIVE: Yesenia Weeks is a 41 y.o. female accompanied by {Persons; PED relatives w/patient:19415}. Patient was referred by *** for ***. Patient reports the following symptoms/concerns: *** Duration of problem: ***; Severity of problem: {Mild/Moderate/Severe:20260}  OBJECTIVE: Mood: {BHH MOOD:22306} and Affect: {BHH AFFECT:22307} Risk of harm to self or others: {CHL AMB BH Suicide Current Mental Status:21022748}   LIFE CONTEXT: Family and Social: *** School/Work: *** Self-Care: *** Life Changes: ***  GOALS ADDRESSED: Patient will reduce symptoms of: {IBH Symptoms:21014056} and increase knowledge and/or ability of: {IBH Patient Tools:21014057} and also: {IBH Goals:21014053}  INTERVENTIONS: {IBH Interventions:21014054} Standardized Assessments completed: {IBH Screening Tools:21014051}  ASSESSMENT: Patient currently experiencing ***. Patient may benefit from ***.  PLAN: 1. Follow up with behavioral health clinician on : *** 2. Behavioral recommendations: *** 3. Referral(s): {IBH Referrals:21014055} 4. "From scale of 1-10, how likely are you to follow plan?": ***  Jamie C McMannes, LCSWA

## 2017-02-11 ENCOUNTER — Telehealth (HOSPITAL_COMMUNITY): Payer: Self-pay | Admitting: *Deleted

## 2017-02-11 NOTE — Telephone Encounter (Signed)
Preadmission screen  

## 2017-02-12 ENCOUNTER — Telehealth (HOSPITAL_COMMUNITY): Payer: Self-pay | Admitting: *Deleted

## 2017-02-12 NOTE — Telephone Encounter (Signed)
Preadmission screen  

## 2017-02-13 ENCOUNTER — Encounter (HOSPITAL_COMMUNITY): Payer: Self-pay

## 2017-02-13 ENCOUNTER — Ambulatory Visit (HOSPITAL_COMMUNITY)
Admission: RE | Admit: 2017-02-13 | Discharge: 2017-02-13 | Disposition: A | Discharge status: Home / Self Care | Payer: Medicaid Other | Source: Ambulatory Visit | Attending: Obstetrics & Gynecology | Admitting: Obstetrics & Gynecology

## 2017-02-13 DIAGNOSIS — O09523 Supervision of elderly multigravida, third trimester: Secondary | ICD-10-CM | POA: Insufficient documentation

## 2017-02-13 DIAGNOSIS — Z3A37 37 weeks gestation of pregnancy: Secondary | ICD-10-CM | POA: Diagnosis not present

## 2017-02-13 DIAGNOSIS — O99313 Alcohol use complicating pregnancy, third trimester: Secondary | ICD-10-CM | POA: Diagnosis not present

## 2017-02-13 DIAGNOSIS — O365930 Maternal care for other known or suspected poor fetal growth, third trimester, not applicable or unspecified: Secondary | ICD-10-CM | POA: Diagnosis not present

## 2017-02-13 NOTE — ED Notes (Signed)
Pt reports leaking a very small amt of clear, watery fluid since yesterday.

## 2017-02-14 ENCOUNTER — Other Ambulatory Visit (HOSPITAL_COMMUNITY): Payer: Self-pay | Admitting: *Deleted

## 2017-02-14 ENCOUNTER — Ambulatory Visit (INDEPENDENT_AMBULATORY_CARE_PROVIDER_SITE_OTHER): Payer: Medicaid Other | Admitting: Obstetrics & Gynecology

## 2017-02-14 ENCOUNTER — Encounter (HOSPITAL_COMMUNITY): Payer: Self-pay | Admitting: *Deleted

## 2017-02-14 ENCOUNTER — Telehealth (HOSPITAL_COMMUNITY): Payer: Self-pay | Admitting: *Deleted

## 2017-02-14 ENCOUNTER — Ambulatory Visit: Payer: Medicaid Other | Admitting: Clinical

## 2017-02-14 VITALS — BP 126/84 | HR 80 | Wt 187.1 lb

## 2017-02-14 DIAGNOSIS — O09523 Supervision of elderly multigravida, third trimester: Secondary | ICD-10-CM

## 2017-02-14 DIAGNOSIS — O0990 Supervision of high risk pregnancy, unspecified, unspecified trimester: Secondary | ICD-10-CM

## 2017-02-14 DIAGNOSIS — F4323 Adjustment disorder with mixed anxiety and depressed mood: Secondary | ICD-10-CM

## 2017-02-14 DIAGNOSIS — A6000 Herpesviral infection of urogenital system, unspecified: Secondary | ICD-10-CM

## 2017-02-14 DIAGNOSIS — O0993 Supervision of high risk pregnancy, unspecified, third trimester: Secondary | ICD-10-CM

## 2017-02-14 NOTE — BH Specialist Note (Signed)
Integrated Behavioral Health Follow Up Visit  MRN: [id number] Name: Yesenia Weeks   Session Start time: 11:10 Session End time: 11:22 Total time: 15 minutes Number of Integrated Behavioral Health Clinician visits: 2/10  Type of Service: Integrated Behavioral Health- Individual/Family Interpretor:No. Interpretor Name and Language: n/a   Warm Hand Off Completed.       SUBJECTIVE: Yesenia Weeks is a 41 y.o. female accompanied by patient. Patient was referred by Dr Macon LargeAnyanwu for depression and anxiety. Patient reports the following symptoms/concerns: Pt states that she is feeling more prepared, practically and emotionally, for upcoming changes that baby will bring to her life, is walking daily, new housing awaiting inspection, utilizing case management services, and has read through educational materials from last visit.  No additional concerns today. Duration of problem: n/a; Severity of problem: n/a, feels the problem has resolved  OBJECTIVE: Mood: appropriate and Affect: Appropriate Risk of harm to self or others: No plan to harm self or others   LIFE CONTEXT: Family and Social: Lives with 40yo; supportive FOB and cousin  School/Work: - Self-Care: Staying physically active, praying more Life Changes: Current pregnancy  GOALS ADDRESSED: Patient will reduce symptoms of: anxiety, depression and stress and increase knowledge and/or ability of: maintaining self-managment and also: Increase healthy adjustment to current life circumstances and Increase adequate support systems for patient/family  INTERVENTIONS: Solution-Focused Strategies Standardized Assessments completed: GAD-7 and PHQ 9  ASSESSMENT: Patient currently experiencing Adjustment disorder with mixed anxious and depressed mood, mild. Patient may benefit from continued brief therapeutic interventions to cope with symptoms of anxiety and depression.  PLAN: 1. Follow up with behavioral health clinician on : Postpartum  visit, about 7 weeks 2. Behavioral recommendations:  - Continue walking at least 20 minutes daily - Continue with prayer time daily - Attend Mom talk and Breastfeeding support groups first Tuesday morning postpartum 3. Referral(s): Integrated Behavioral Health Services (In Clinic) 4. "From scale of 1-10, how likely are you to follow plan?": 9  Rae LipsJamie C Meshach Perry, LCSWA  Dep[facility] screen [facility] 02/14/2017 01/30/2017 12/31/2016 11/14/2016 10/18/2016  Decreased Interest 0 1 1 1  0  Down, Depressed, Hopeless 1 1 1 1  0  PHQ - 2 Score 1 2 2 2  0  Altered sleeping 1 1 1 1 1   Tired, decreased energy 1 2 2 1 2   Change in appetite 0 2 0 1 2  Feeling bad or failure about yourself  0 2 0 1 0  Trouble concentrating 0 1 0 1 0  Moving slowly or fidgety/restless 0 0 0 1 0  Suicidal thoughts 0 0 0 0 0  PHQ-9 Score 3 10 5 8 5    GAD 7 : Generalized Anxiety Score 02/14/2017 01/30/2017 12/31/2016 11/14/2016  Nervous, Anxious, on Edge 1 1 1 1   Control/stop worrying 1 1 1 1   Worry too much - different things 1 1 1 1   Trouble relaxing 0 1 1 2   Restless 0 2 1 2   Easily annoyed or irritable 0 1 1 1   Afraid - awful might happen 1 1 1 1   Total GAD 7 Score 4 8 7  9

## 2017-02-14 NOTE — Progress Notes (Signed)
PRENATAL VISIT NOTE  Subjective:  Yesenia Weeks is a 41 y.o. [id number] at [redacted]w[redacted]d being seen today for ongoing prenatal care.  She is currently monitored for the following issues for this high-risk pregnancy and has ANEMIA, IRON DEFICIENCY, UNSPEC.; Mood disorder (HCC); RHINITIS, ALLERGIC; ACNE, MILD; ASTEATOTIC ECZEMA; HALLUX VALGUS, ACQUIRED; Genital herpes; Family history of breast cancer; Anxiety state; GBS bacteriuria; AMA (advanced maternal age) multigravida 35+; Supervision of high risk pregnancy, antepartum; and Small for gestational age fetus affecting management of mother, third trimester, fetus 1 on her problem list.  Patient reports carpal tunnel symptoms and occasional contractions.  Contractions: Irregular. Vag. Bleeding: None.  Movement: Present. Denies leaking of fluid.   The following portions of the patient's history were reviewed and updated as appropriate: allergies, current medications, past family history, past medical history, past social history, past surgical history and problem list. Problem list updated.  Objective:   Vitals:   02/14/17 1002  BP: 126/84  Pulse: 80  Weight: 187 lb 1.6 oz (84.9 kg)    Fetal Status: Fetal Heart Rate (bpm): 145 Fundal Height: 35 cm Movement: Present  Presentation: Vertex  General:  Alert, oriented and cooperative. Patient is in no acute distress.  Skin: Skin is warm and dry. No rash noted.   Cardiovascular: Normal heart rate noted  Respiratory: Normal respiratory effort, no problems with respiration noted  Abdomen: Soft, gravid, appropriate for gestational age.  Pain/Pressure: Present     Pelvic: Cervical exam performed Dilation: 1 Effacement (%): Thick Station: Ballotable  Extremities: Normal range of motion.  Edema: Trace  Mental Status:  Normal mood and affect. Normal behavior. Normal judgment and thought content.  [place] Mfm Fetal Bpp Wo Non Stress  Result Date:  02/13/2017 ----------------------------------------------------------------------  OBSTETRICS REPORT                      (Signed Final 02/13/2017 10:14 am) ---------------------------------------------------------------------- Patient Info  ID #:       [id number]                         D.O.B.:   Dec 02, 1975 (41 yrs)  Name:       Yesenia Weeks                   Visit Date:  02/13/2017 08:07 am ---------------------------------------------------------------------- Performed By  Performed By:     Emeline Darling BS,      Secondary Phy.:   Ernestina Penna MD  Attending:        Particia Nearing MD       Address:          [address]  [address]                                                             [place], [place]                                                             [address]  Referred By:      [facility]       Location:         [facility] for                    [facility]                    Healthcare  Ref. Address:     [facility]                    [address]                    [place], [place]                    [address] ---------------------------------------------------------------------- Orders   #  Description                                 Code   1  [place] MFM UA CORD DOPPLER                      [phone]   2  [place] MFM FETAL BPP WO NON STRESS              47829.56  ----------------------------------------------------------------------   #  Ordered By               Order #        Accession #    Episode #   1  Particia Nearing            [id number]      [id number]     [id number]   2  MARTHA DECKER            [id number]      [id number]     [id number]  ---------------------------------------------------------------------- Indications   [redacted] weeks gestation of pregnancy                Z3A.69    Advanced maternal age multigravida 54+,        O1.523   third trimester -low risk NIPS   Small for gestational age fetus affecting      O36.5990   management of mother   Alcohol use complicating pregnancy, third      O96.313  trimester  ---------------------------------------------------------------------- OB History  Gravidity:    4         Term:   2         SAB:   1  Living:       2 ---------------------------------------------------------------------- Fetal Evaluation  Num Of Fetuses:     1  Fetal Heart         140  Rate(bpm):  Cardiac Activity:   Observed  Presentation:       Cephalic  Amniotic Fluid  AFI FV:      Subjectively within normal limits  AFI Sum(cm)     %Tile       Largest Pocket(cm)  14.17           54          3.95  RUQ(cm)       RLQ(cm)       LUQ(cm)        LLQ(cm)  3.95          3.08          3.68           3.46 ---------------------------------------------------------------------- Biophysical Evaluation  Amniotic F.V:   Pocket => 2 cm two         F. Tone:        Observed                  planes  F. Movement:    Observed                   Score:          8/8  F. Breathing:   Observed ---------------------------------------------------------------------- Gestational Age  LMP:           32w 1d       Date:   07/03/16                 EDD:   04/09/17  Best:          37w 5d    Det. ByMarcella Dubs         EDD:   03/01/17                                      (08/17/16) ---------------------------------------------------------------------- Doppler - Fetal Vessels  Umbilical Artery   S/D     %tile     RI              PI              PSV                                                   (cm/s)  2.43       57   0.59             0.88             53.61 ---------------------------------------------------------------------- Impression  SIUP at 37+5 weeks  Cephalic presentation  Normal amniotic fluid volume  BPP 8/8  UA dopplers were normal for this GA  ---------------------------------------------------------------------- Recommendations  Fetal assessment once more next week; then delivery at 39  weeks ----------------------------------------------------------------------  Particia Nearing, MD Electronically Signed Final Report   02/13/2017 10:14 am ----------------------------------------------------------------------  [place] Mfm Fetal Bpp Wo Non Stress  Result Date: 02/06/2017 ----------------------------------------------------------------------  OBSTETRICS REPORT                      (Signed Final 02/06/2017 09:35 am) ---------------------------------------------------------------------- Patient Info  ID #:       [id number]                         D.O.B.:   [birth year] (41 yrs)  Name:       Yesenia Weeks                   Visit Date:  02/06/2017 08:09 am ---------------------------------------------------------------------- Performed By  Performed By:     Tommie Raymond BS,       Secondary Phy.:   UGONNA A                    RDMS, RVT                                                             Macon Large MD  Attending:        Particia Nearing MD       Address:          [address]                                                             [place], [place]                                                             [address]  Referred By:      [facility]       Location:         [facility] for                    [facility]                    Healthcare  Ref. Address:     [facility]                    [address]                    [place], [place]                    [address] ---------------------------------------------------------------------- Orders   #  Description                                 Code   1  [place] MFM UA CORD DOPPLER                      [phone]   2  [place] MFM FETAL BPP WO NON STRESS               76819.01  ----------------------------------------------------------------------   #  Ordered By               Order #        Accession #    Episode #   1  Particia Nearing            [id number]      [id number]     [id number]   2  MARTHA DECKER            [id number]      [id number]     [id number]  ---------------------------------------------------------------------- Indications   [redacted] weeks gestation of pregnancy                Z3A.45   Advanced maternal age multigravida 47+,        O4.523   third trimester -low risk NIPS   Small for gestational age fetus affecting      O35.5990   management of mother   Alcohol use complicating pregnancy, third      O99.313   trimester  ---------------------------------------------------------------------- OB History  Gravidity:    4         Term:   2         SAB:   1  Living:       2 ---------------------------------------------------------------------- Fetal Evaluation  Num Of Fetuses:     1  Fetal Heart         123  Rate(bpm):  Cardiac Activity:   Observed  Presentation:       Cephalic  Placenta:           Posterior Right, above cervical os  P. Cord Insertion:  Previously Visualized  Amniotic Fluid  AFI FV:      Subjectively within normal limits  AFI Sum(cm)     %Tile       Largest Pocket(cm)  13.47           49          4.23  RUQ(cm)       RLQ(cm)       LUQ(cm)        LLQ(cm)  3.74          4.23          3.26           2.24 ---------------------------------------------------------------------- Biophysical Evaluation  Amniotic F.V:   Within normal limits       F. Tone:        Observed  F. Movement:    Observed                   Score:          8/8  F. Breathing:   Observed ---------------------------------------------------------------------- Gestational Age  LMP:           31w 1d       Date:   07/03/16                 EDD:   04/09/17  Best:          36w 5d    Det. By:   Marcella Dubs         EDD:   03/01/17                                      (08/17/16)  ---------------------------------------------------------------------- Doppler - Fetal Vessels  Umbilical Artery   S/D     %tile     RI              PI              PSV    ADFV    RDFV                                                   (cm/s)  2.41       53   0.59             0.87            107.18      No      No ---------------------------------------------------------------------- Cervix Uterus Adnexa  Cervix  Not visualized (advanced GA >29wks)  Uterus  No abnormality visualized.  Left Ovary  Size(cm)     3.04  x    2.08   x  1.62      Vol(ml): 5.4  Within normal limits. No adnexal mass visualized.  Right Ovary  Size(cm)     2.63  x    2.15   x  1.97      Vol(ml): 5.8  Within normal limits. No adnexal mass visualized.  Cul De Sac:   No free fluid seen.  Adnexa:       No abnormality visualized. ---------------------------------------------------------------------- Impression  SIUP at 36+5 weeks  Cephalic presentation  Normal amniotic fluid volume  BPP 8/8  UA dopplers were normal for this GA (53rd %tile) ---------------------------------------------------------------------- Recommendations  Continue weekly BPPs and UA dopplers  Growth on 08/01  Delivery at 39 weeks if testing reassuring ----------------------------------------------------------------------                 Particia Nearing, MD Electronically Signed Final Report   02/06/2017 09:35 am ----------------------------------------------------------------------  [place] Mfm Fetal Bpp Wo Non Stress  Result Date: 01/30/2017 ----------------------------------------------------------------------  OBSTETRICS REPORT                      (Signed Final 01/30/2017 01:16 pm) ---------------------------------------------------------------------- Patient Info  ID #:       [id number]                         D.O.B.:   20-Mar-1976 (41 yrs)  Name:       Yesenia Weeks                   Visit Date:  01/30/2017 08:32 am  ---------------------------------------------------------------------- Performed By  Performed By:  Lora Mae Murrow        Ref. Address:     [facility]                                                             OB/Gyn Clinic                                                             [address]                                                             [place], [place]                                                             [address]  Attending:        Particia Nearing MD       Location:         [facility]  Referred By:      [facility] for                    [facility]                    Healthcare ---------------------------------------------------------------------- Orders   #  Description                                 Code   1  [place] MFM OB FOLLOW UP                         [address].09   2  [place] MFM UA CORD DOPPLER                      76820.02   3  [place] MFM FETAL BPP WO NON STRESS              81191.47  ----------------------------------------------------------------------   #  Ordered By               Order #  Accession #    Episode #   1  MARK NEWMAN              [id number]      [id number]     [id number]   2  MARK NEWMAN              [id number]      [id number]     [id number]   3  MARK NEWMAN              [id number]      [id number]     [id number]  ---------------------------------------------------------------------- Indications   [redacted] weeks gestation of pregnancy                Z3A.53   Advanced maternal age multigravida 76+,        O54.523   third trimester -low risk NIPS   Small for gestational age fetus affecting      O16.5990   management of mother   Alcohol use complicating pregnancy, third      O99.313   trimester  ---------------------------------------------------------------------- OB History  Gravidity:    4         Term:   2         SAB:   1  Living:       2  ---------------------------------------------------------------------- Fetal Evaluation  Num Of Fetuses:     1  Fetal Heart         145  Rate(bpm):  Cardiac Activity:   Observed  Presentation:       Cephalic  Placenta:           Right lateral, above cervical os  P. Cord Insertion:  Previously Visualized  Amniotic Fluid  AFI FV:      Subjectively within normal limits  AFI Sum(cm)     %Tile       Largest Pocket(cm)  18.86           70          5.44  RUQ(cm)       RLQ(cm)       LUQ(cm)        LLQ(cm)  3.4           5.26          5.44           4.76 ---------------------------------------------------------------------- Biometry  BPD:      84.8  mm     G. Age:  34w 1d         16  %    CI:        78.33   %   70 - 86                                                          FL/HC:      21.6   %   20.1 - 22.1  HC:      303.1  mm     G. Age:  33w 5d        < 3  %    HC/AC:      1.04       0.93 - 1.11  AC:      290.3  mm     G. Age:  33w 0d  4  %    FL/BPD:     77.1   %   71 - 87  FL:       65.4  mm     G. Age:  33w 5d          7  %    FL/AC:      22.5   %   20 - 24  HUM:      56.1  mm     G. Age:  32w 5d          9  %  Est. FW:    2186  gm    4 lb 13 oz      15  % ---------------------------------------------------------------------- Gestational Age  LMP:           30w 1d       Date:   07/03/16                 EDD:   04/09/17  U/S Today:     33w 5d                                        EDD:   03/15/17  Best:          35w 5d    Det. ByMarcella Dubs         EDD:   03/01/17                                      (08/17/16) ---------------------------------------------------------------------- Anatomy  Cranium:               Appears normal         Aortic Arch:            Previously seen  Cavum:                 Previously seen        Ductal Arch:            Previously seen  Ventricles:            Appears normal         Diaphragm:              Previously seen  Choroid Plexus:        Previously seen        Stomach:                 Appears normal, left                                                                        sided  Cerebellum:            Previously seen        Abdomen:                Appears normal  Posterior Fossa:       Previously seen        Abdominal Wall:         Previously  seen  Nuchal Fold:           Previously seen        Cord Vessels:           Previously seen  Face:                  Orbits and profile     Kidneys:                Appear normal                         previously seen  Lips:                  Previously seen        Bladder:                Appears normal  Thoracic:              Appears normal         Spine:                  Previously seen  Heart:                 Appears normal         Upper Extremities:      Previously seen                         (4CH, axis, and situs  RVOT:                  Previously seen        Lower Extremities:      Previously seen  LVOT:                  Previously seen  Other:  Heels and 5th digit previously visualized. Nasal bone previously          visualized. Open hands previously visualized. ---------------------------------------------------------------------- Doppler - Fetal Vessels  Umbilical Artery   S/D     %tile     RI              PI              PSV    ADFV    RDFV                                                   (cm/s)  2.98       77   0.66             1.08             44.94      No      No ---------------------------------------------------------------------- Impression  SIUP at 35+5 weeks  Cephalic presentation  Normal interval anatomy; anatomic survey complete  Normal amniotic fluid volume  EFW at the 15th %tile; AC at the 4th %tile  UA dopplers were normal for this GA  BPP 8/8 ---------------------------------------------------------------------- Recommendations  Weekly BPPs and UA dopplers  Delivery at 39 weeks ----------------------------------------------------------------------                 Particia Nearing, MD Electronically Signed Final Report    01/30/2017 01:16 pm ----------------------------------------------------------------------  [place] Mfm Ob Follow Up  Result Date: 01/30/2017 ----------------------------------------------------------------------  OBSTETRICS REPORT                      (Signed Final 01/30/2017 01:16 pm) ---------------------------------------------------------------------- Patient Info  ID #:       [id number]                         D.O.B.:   03-Sep-1975 (41 yrs)  Name:       Yesenia Weeks                   Visit Date:  01/30/2017 08:32 am ---------------------------------------------------------------------- Performed By  Performed By:     Raoul Pitch        Ref. Address:     [facility]                                                             OB/Gyn Clinic                                                             [address]                                                             [place], [place]                                                             [address]  Attending:        Particia Nearing MD       Location:         [facility]  Referred By:      [facility] for                    [facility]                    Healthcare ---------------------------------------------------------------------- Orders   #  Description  Code   1  [place] MFM OB FOLLOW UP                         [id number]   2  [place] MFM UA CORD DOPPLER                      76820.02   3  [place] MFM FETAL BPP WO NON STRESS              76819.01  ----------------------------------------------------------------------   #  Ordered By               Order #        Accession #    Episode #   1  MARK NEWMAN              [id number]      [id number]     [id number]   2  MARK NEWMAN              [id number]      [id number]     [id number]   3  MARK NEWMAN              [id number]      [id number]     [id number]   ---------------------------------------------------------------------- Indications   [redacted] weeks gestation of pregnancy                Z3A.35   Advanced maternal age multigravida 75+,        O18.523   third trimester -low risk NIPS   Small for gestational age fetus affecting      O63.5990   management of mother   Alcohol use complicating pregnancy, third      O99.313   trimester  ---------------------------------------------------------------------- OB History  Gravidity:    4         Term:   2         SAB:   1  Living:       2 ---------------------------------------------------------------------- Fetal Evaluation  Num Of Fetuses:     1  Fetal Heart         145  Rate(bpm):  Cardiac Activity:   Observed  Presentation:       Cephalic  Placenta:           Right lateral, above cervical os  P. Cord Insertion:  Previously Visualized  Amniotic Fluid  AFI FV:      Subjectively within normal limits  AFI Sum(cm)     %Tile       Largest Pocket(cm)  18.86           70          5.44  RUQ(cm)       RLQ(cm)       LUQ(cm)        LLQ(cm)  3.4           5.26          5.44           4.76 ---------------------------------------------------------------------- Biometry  BPD:      84.8  mm     G. Age:  34w 1d         16  %    CI:        78.33   %   70 - 86  FL/HC:      21.6   %   20.1 - 22.1  HC:      303.1  mm     G. Age:  33w 5d        < 3  %    HC/AC:      1.04       0.93 - 1.11  AC:      290.3  mm     G. Age:  33w 0d          4  %    FL/BPD:     77.1   %   71 - 87  FL:       65.4  mm     G. Age:  33w 5d          7  %    FL/AC:      22.5   %   20 - 24  HUM:      56.1  mm     G. Age:  32w 5d          9  %  Est. FW:    2186  gm    4 lb 13 oz      15  % ---------------------------------------------------------------------- Gestational Age  LMP:           30w 1d       Date:   07/03/16                 EDD:   04/09/17  U/S Today:     33w 5d                                        EDD:    03/15/17  Best:          35w 5d    Det. ByMarcella Dubs         EDD:   03/01/17                                      (08/17/16) ---------------------------------------------------------------------- Anatomy  Cranium:               Appears normal         Aortic Arch:            Previously seen  Cavum:                 Previously seen        Ductal Arch:            Previously seen  Ventricles:            Appears normal         Diaphragm:              Previously seen  Choroid Plexus:        Previously seen        Stomach:                Appears normal, left  sided  Cerebellum:            Previously seen        Abdomen:                Appears normal  Posterior Fossa:       Previously seen        Abdominal Wall:         Previously seen  Nuchal Fold:           Previously seen        Cord Vessels:           Previously seen  Face:                  Orbits and profile     Kidneys:                Appear normal                         previously seen  Lips:                  Previously seen        Bladder:                Appears normal  Thoracic:              Appears normal         Spine:                  Previously seen  Heart:                 Appears normal         Upper Extremities:      Previously seen                         (4CH, axis, and situs  RVOT:                  Previously seen        Lower Extremities:      Previously seen  LVOT:                  Previously seen  Other:  Heels and 5th digit previously visualized. Nasal bone previously          visualized. Open hands previously visualized. ---------------------------------------------------------------------- Doppler - Fetal Vessels  Umbilical Artery   S/D     %tile     RI              PI              PSV    ADFV    RDFV                                                   (cm/s)  2.98       77   0.66             1.08             44.94      No      No  ---------------------------------------------------------------------- Impression  SIUP at 35+5 weeks  Cephalic presentation  Normal interval anatomy; anatomic survey complete  Normal amniotic fluid volume  EFW  at the 15th %tile; AC at the 4th %tile  UA dopplers were normal for this GA  BPP 8/8 ---------------------------------------------------------------------- Recommendations  Weekly BPPs and UA dopplers  Delivery at 39 weeks ----------------------------------------------------------------------                 Particia Nearing, MD Electronically Signed Final Report   01/30/2017 01:16 pm ----------------------------------------------------------------------  [place] Mfm Ua Cord Doppler  Result Date: 02/13/2017 ----------------------------------------------------------------------  OBSTETRICS REPORT                      (Signed Final 02/13/2017 10:14 am) ---------------------------------------------------------------------- Patient Info  ID #:       [id number]                         D.O.B.:   05/09/1976 (41 yrs)  Name:       Yesenia Weeks                   Visit Date:  02/13/2017 08:07 am ---------------------------------------------------------------------- Performed By  Performed By:     Emeline Darling BS,      Secondary Phy.:   Ernestina Penna MD  Attending:        Particia Nearing MD       Address:          [address]                                                             [place], [place]                                                             [address]  Referred By:      [facility]       Location:         [facility] for                    [facility]                    Healthcare  Ref. Address:     [facility]  OB[facility]yn [facility]                    [address]                     [place], [place]                    [id number] ---------------------------------------------------------------------- Orders   #  Description                                 Code   1  US MFM UA CORD DOPPLER                      [phone] [id number]   2  US MFM FETAL BPP WO NON STRESS              76819.01  ----------------------------------------------------------------------   #  Ordered By               Order #        Accession #    Episode #   1  Particia NearingMARTHA DECKER            [id number]      [id number]     [id number]   2  MARTHA DECKER            [id number]      [id number]     [id number]  ---------------------------------------------------------------------- Indications   [redacted] weeks gestation of pregnancy                Z3A.4237   Advanced maternal age multigravida 6735+,        17O09.523   third trimester -low risk NIPS   Small for gestational age fetus affecting      76O36.5990   management of mother   Alcohol use complicating pregnancy, third      O99.313   trimester  ---------------------------------------------------------------------- OB History  Gravidity:    4         Term:   2         SAB:   1  Living:       2 ---------------------------------------------------------------------- Fetal Evaluation  Num Of Fetuses:     1  Fetal Heart         140  Rate(bpm):  Cardiac Activity:   Observed  Presentation:       Cephalic  Amniotic Fluid  AFI FV:      Subjectively within normal limits  AFI Sum(cm)     %Tile       Largest Pocket(cm)  14.17           54          3.95  RUQ(cm)       RLQ(cm)       LUQ(cm)        LLQ(cm)  3.95          3.08          3.68           3.46 ---------------------------------------------------------------------- Biophysical Evaluation  Amniotic F.V:   Pocket => 2 cm two         F. Tone:        Observed  planes  F. Movement:    Observed                   Score:          8/8  F. Breathing:   Observed ---------------------------------------------------------------------- Gestational Age  LMP:           32w 1d        Date:   07/03/16                 EDD:   04/09/17  Best:          37w 5d    Det. ByMarcella Dubs         EDD:   03/01/17                                      (08/17/16) ---------------------------------------------------------------------- Doppler - Fetal Vessels  Umbilical Artery   S/D     %tile     RI              PI              PSV                                                   (cm/s)  2.43       57   0.59             0.88             53.61 ---------------------------------------------------------------------- Impression  SIUP at 37+5 weeks  Cephalic presentation  Normal amniotic fluid volume  BPP 8/8  UA dopplers were normal for this GA ---------------------------------------------------------------------- Recommendations  Fetal assessment once more next week; then delivery at 39  weeks ----------------------------------------------------------------------                 Particia Nearing, MD Electronically Signed Final Report   02/13/2017 10:14 am ----------------------------------------------------------------------  [place] Mfm Ua Cord Doppler  Result Date: 02/06/2017 ----------------------------------------------------------------------  OBSTETRICS REPORT                      (Signed Final 02/06/2017 09:35 am) ---------------------------------------------------------------------- Patient Info  ID #:       [id number]                         D.O.B.:   January 12, 1976 (41 yrs)  Name:       Yesenia Weeks                   Visit Date:  02/06/2017 08:09 am ---------------------------------------------------------------------- Performed By  Performed By:     Tommie Raymond BS,       Secondary Phy.:   UGONNA A                    RDMS, RVT                                                             ANYANWU MD  Attending:  Particia Nearing MD       Address:          [address]                                                             [place], [place]                                                              [address]  Referred By:      [facility]       Location:         [facility] for                    [facility]                    Healthcare  Ref. Address:     [facility]                    [address]                    [place], [place]                    [address] ---------------------------------------------------------------------- Orders   #  Description                                 Code   1  [place] MFM UA CORD DOPPLER                      [phone]   2  [place] MFM FETAL BPP WO NON STRESS              47829.56  ----------------------------------------------------------------------   #  Ordered By               Order #        Accession #    Episode #   1  Particia Nearing            [id number]      [id number]     [id number]   2  MARTHA DECKER            [id number]      [id number]     [id number]  ----------------------------------------------------------------------  Indications   [redacted] weeks gestation of pregnancy                Z3A.36   Advanced maternal age multigravida 45+,        O60.523   third trimester -low risk NIPS   Small for gestational age fetus affecting      O67.5990   management of mother   Alcohol use complicating pregnancy, third      O99.313   trimester  ---------------------------------------------------------------------- OB History  Gravidity:    4         Term:   2         SAB:   1  Living:       2 ---------------------------------------------------------------------- Fetal Evaluation  Num Of Fetuses:     1  Fetal Heart         123  Rate(bpm):  Cardiac Activity:   Observed  Presentation:       Cephalic  Placenta:           Posterior Right, above cervical os  P. Cord Insertion:  Previously Visualized  Amniotic Fluid  AFI FV:      Subjectively within normal limits  AFI Sum(cm)     %Tile       Largest Pocket(cm)  13.47           49          4.23  RUQ(cm)        RLQ(cm)       LUQ(cm)        LLQ(cm)  3.74          4.23          3.26           2.24 ---------------------------------------------------------------------- Biophysical Evaluation  Amniotic F.V:   Within normal limits       F. Tone:        Observed  F. Movement:    Observed                   Score:          8/8  F. Breathing:   Observed ---------------------------------------------------------------------- Gestational Age  LMP:           31w 1d       Date:   07/03/16                 EDD:   04/09/17  Best:          36w 5d    Det. By:   Marcella Dubs         EDD:   03/01/17                                      (08/17/16) ---------------------------------------------------------------------- Doppler - Fetal Vessels  Umbilical Artery   S/D     %tile     RI              PI              PSV    ADFV    RDFV                                                   (cm/s)  2.41  53   0.59             0.87            107.18      No      No ---------------------------------------------------------------------- Cervix Uterus Adnexa  Cervix  Not visualized (advanced GA >29wks)  Uterus  No abnormality visualized.  Left Ovary  Size(cm)     3.04  x    2.08   x  1.62      Vol(ml): 5.4  Within normal limits. No adnexal mass visualized.  Right Ovary  Size(cm)     2.63  x    2.15   x  1.97      Vol(ml): 5.8  Within normal limits. No adnexal mass visualized.  Cul De Sac:   No free fluid seen.  Adnexa:       No abnormality visualized. ---------------------------------------------------------------------- Impression  SIUP at 36+5 weeks  Cephalic presentation  Normal amniotic fluid volume  BPP 8/8  UA dopplers were normal for this GA (53rd %tile) ---------------------------------------------------------------------- Recommendations  Continue weekly BPPs and UA dopplers  Growth on 08/01  Delivery at 39 weeks if testing reassuring ----------------------------------------------------------------------                 Particia NearingMartha Decker, MD  Electronically Signed Final Report   02/06/2017 09:35 am ----------------------------------------------------------------------  [place] Mfm Ua Cord Doppler  Result Date: 01/30/2017 ----------------------------------------------------------------------  OBSTETRICS REPORT                      (Signed Final 01/30/2017 01:16 pm) ---------------------------------------------------------------------- Patient Info  ID #:       [id number]                         D.O.B.:   [birth year] (41 yrs)  Name:       Yesenia CarneAPRIL Weeks                   Visit Date:  01/30/2017 08:32 am ---------------------------------------------------------------------- Performed By  Performed By:     Raoul PitchLora Mae Murrow        Ref. Address:     [facility]                    RDMS                                                             OB/Gyn Clinic                                                             [address]                                                             [address]  [place], [place]                                                             [address]  Attending:        Particia Nearing MD       Location:         [facility]  Referred By:      [facility] for                    [facility]                    Healthcare ---------------------------------------------------------------------- Orders   #  Description                                 Code   1  [place] MFM OB FOLLOW UP                         [phone]   2  [place] MFM UA CORD DOPPLER                      76820.02   3  [place] MFM FETAL BPP WO NON STRESS              76819.01  ----------------------------------------------------------------------   #  Ordered By               Order #        Accession #    Episode #   1  MARK NEWMAN              [id number]      [id number]     [id number]   2  MARK NEWMAN              [id number]      [id number]     [id number]   3  MARK NEWMAN              [id number]      [id number]      [id number]  ---------------------------------------------------------------------- Indications   [redacted] weeks gestation of pregnancy                Z3A.37   Advanced maternal age multigravida 60+,        O46.523   third trimester -low risk NIPS   Small for gestational age fetus affecting      O42.5990   management of mother   Alcohol use complicating pregnancy, third      O99.313   trimester  ---------------------------------------------------------------------- OB History  Gravidity:    4         Term:   2         SAB:   1  Living:       2 ---------------------------------------------------------------------- Fetal Evaluation  Num Of Fetuses:     1  Fetal Heart         145  Rate(bpm):  Cardiac Activity:   Observed  Presentation:       Cephalic  Placenta:  Right lateral, above cervical os  P. Cord Insertion:  Previously Visualized  Amniotic Fluid  AFI FV:      Subjectively within normal limits  AFI Sum(cm)     %Tile       Largest Pocket(cm)  18.86           70          5.44  RUQ(cm)       RLQ(cm)       LUQ(cm)        LLQ(cm)  3.4           5.26          5.44           4.76 ---------------------------------------------------------------------- Biometry  BPD:      84.8  mm     G. Age:  34w 1d         16  %    CI:        78.33   %   70 - 86                                                          FL/HC:      21.6   %   20.1 - 22.1  HC:      303.1  mm     G. Age:  33w 5d        < 3  %    HC/AC:      1.04       0.93 - 1.11  AC:      290.3  mm     G. Age:  33w 0d          4  %    FL/BPD:     77.1   %   71 - 87  FL:       65.4  mm     G. Age:  33w 5d          7  %    FL/AC:      22.5   %   20 - 24  HUM:      56.1  mm     G. Age:  32w 5d          9  %  Est. FW:    2186  gm    4 lb 13 oz      15  % ---------------------------------------------------------------------- Gestational Age  LMP:           30w 1d       Date:   07/03/16                 EDD:   04/09/17  U/S Today:     33w 5d                                         EDD:   03/15/17  Best:          35w 5d    Det. ByMarcella Dubs         EDD:   03/01/17                                      (  08/17/16) ---------------------------------------------------------------------- Anatomy  Cranium:               Appears normal         Aortic Arch:            Previously seen  Cavum:                 Previously seen        Ductal Arch:            Previously seen  Ventricles:            Appears normal         Diaphragm:              Previously seen  Choroid Plexus:        Previously seen        Stomach:                Appears normal, left                                                                        sided  Cerebellum:            Previously seen        Abdomen:                Appears normal  Posterior Fossa:       Previously seen        Abdominal Wall:         Previously seen  Nuchal Fold:           Previously seen        Cord Vessels:           Previously seen  Face:                  Orbits and profile     Kidneys:                Appear normal                         previously seen  Lips:                  Previously seen        Bladder:                Appears normal  Thoracic:              Appears normal         Spine:                  Previously seen  Heart:                 Appears normal         Upper Extremities:      Previously seen                         (4CH, axis, and situs  RVOT:                  Previously seen        Lower Extremities:  Previously seen  LVOT:                  Previously seen  Other:  Heels and 5th digit previously visualized. Nasal bone previously          visualized. Open hands previously visualized. ---------------------------------------------------------------------- Doppler - Fetal Vessels  Umbilical Artery   S/D     %tile     RI              PI              PSV    ADFV    RDFV                                                   (cm/s)  2.98       77   0.66             1.08             44.94      No      No  ---------------------------------------------------------------------- Impression  SIUP at 35+5 weeks  Cephalic presentation  Normal interval anatomy; anatomic survey complete  Normal amniotic fluid volume  EFW at the 15th %tile; AC at the 4th %tile  UA dopplers were normal for this GA  BPP 8/8 ---------------------------------------------------------------------- Recommendations  Weekly BPPs and UA dopplers  Delivery at 39 weeks ----------------------------------------------------------------------                 Particia Nearing, MD Electronically Signed Final Report   01/30/2017 01:16 pm ----------------------------------------------------------------------   Assessment and Plan:  Pregnancy: [id number] at [redacted]w[redacted]d  1. 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 No prodromal symptoms.  2. Elderly multigravida in third trimester Weekly BPP at MFM, IOL scheduled 02/22/2017  3. Supervision of high risk pregnancy, antepartum Term labor symptoms and general obstetric precautions including but not limited to vaginal bleeding, contractions, leaking of fluid and fetal movement were reviewed in detail with the patient. Reassured about carpal tunnel symptoms. Please refer to After Visit Summary for other counseling recommendations.  Return in about 5 weeks (around 03/21/2017) for Postpartum check.   Jaynie Collins, MD

## 2017-02-14 NOTE — Telephone Encounter (Signed)
Preadmission screen  

## 2017-02-14 NOTE — Patient Instructions (Signed)
Labor Induction Labor induction is when steps are taken to cause a pregnant woman to begin the labor process. Most women go into labor on their own between 37 weeks and 42 weeks of the pregnancy. When this does not happen or when there is a medical need, methods may be used to induce labor. Labor induction causes a pregnant woman's uterus to contract. It also causes the cervix to soften (ripen), open (dilate), and thin out (efface). Usually, labor is not induced before 39 weeks of the pregnancy unless there is a problem with the baby or mother. Before inducing labor, your health care provider will consider a number of factors, including the following:  The medical condition of you and the baby.  How many weeks along you are.  The status of the baby's lung maturity.  The condition of the cervix.  The position of the baby. What are the reasons for labor induction? Labor may be induced for the following reasons:  The health of the baby or mother is at risk.  The pregnancy is overdue by 1 week or more.  The water breaks but labor does not start on its own.  The mother has a health condition or serious illness, such as high blood pressure, infection, placental abruption, or diabetes.  The amniotic fluid amounts are low around the baby.  The baby is distressed. Convenience or wanting the baby to be born on a certain date is not a reason for inducing labor. What methods are used for labor induction? Several methods of labor induction may be used, such as:  Prostaglandin medicine. This medicine causes the cervix to dilate and ripen. The medicine will also start contractions. It can be taken by mouth or by inserting a suppository into the vagina.  Inserting a thin tube (catheter) with a balloon on the end into the vagina to dilate the cervix. Once inserted, the balloon is expanded with water, which causes the cervix to open.  Stripping the membranes. Your health care provider separates  amniotic sac tissue from the cervix, causing the cervix to be stretched and causing the release of a hormone called progesterone. This may cause the uterus to contract. It is often done during an office visit. You will be sent home to wait for the contractions to begin. You will then come in for an induction.  Breaking the water. Your health care provider makes a hole in the amniotic sac using a small instrument. Once the amniotic sac breaks, contractions should begin. This may still take hours to see an effect.  Medicine to trigger or strengthen contractions. This medicine is given through an IV access tube inserted into a vein in your arm. All of the methods of induction, besides stripping the membranes, will be done in the hospital. Induction is done in the hospital so that you and the baby can be carefully monitored. How long does it take for labor to be induced? Some inductions can take up to 2-3 days. Depending on the cervix, it usually takes less time. It takes longer when you are induced early in the pregnancy or if this is your first pregnancy. If a mother is still pregnant and the induction has been going on for 2-3 days, either the mother will be sent home or a cesarean delivery will be needed. What are the risks associated with labor induction? Some of the risks of induction include:  Changes in fetal heart rate, such as too high, too low, or erratic.  Fetal distress.    Chance of infection for the mother and baby.  Increased chance of having a cesarean delivery.  Breaking off (abruption) of the placenta from the uterus (rare).  Uterine rupture (very rare). When induction is needed for medical reasons, the benefits of induction may outweigh the risks. What are some reasons for not inducing labor? Labor induction should not be done if:  It is shown that your baby does not tolerate labor.  You have had previous surgeries on your uterus, such as a myomectomy or the removal of  fibroids.  Your placenta lies very low in the uterus and blocks the opening of the cervix (placenta previa).  Your baby is not in a head-down position.  The umbilical cord drops down into the birth canal in front of the baby. This could cut off the baby's blood and oxygen supply.  You have had a previous cesarean delivery.  There are unusual circumstances, such as the baby being extremely premature. This information is not intended to replace advice given to you by your health care provider. Make sure you discuss any questions you have with your health care provider. Document Released: 11/28/2006 Document Revised: 12/15/2015 Document Reviewed: 02/05/2013 Elsevier Interactive Patient Education  2017 Elsevier Inc.  

## 2017-02-20 ENCOUNTER — Encounter (HOSPITAL_COMMUNITY): Payer: Self-pay

## 2017-02-20 ENCOUNTER — Ambulatory Visit (HOSPITAL_COMMUNITY)
Admission: RE | Admit: 2017-02-20 | Discharge: 2017-02-20 | Disposition: A | Discharge status: Home / Self Care | Payer: Medicaid Other | Source: Ambulatory Visit | Attending: Maternal and Fetal Medicine | Admitting: Maternal and Fetal Medicine

## 2017-02-20 ENCOUNTER — Other Ambulatory Visit: Payer: Self-pay | Admitting: Advanced Practice Midwife

## 2017-02-20 DIAGNOSIS — O365990 Maternal care for other known or suspected poor fetal growth, unspecified trimester, not applicable or unspecified: Secondary | ICD-10-CM | POA: Diagnosis present

## 2017-02-20 DIAGNOSIS — O365931 Maternal care for other known or suspected poor fetal growth, third trimester, fetus 1: Secondary | ICD-10-CM

## 2017-02-20 DIAGNOSIS — Z3A38 38 weeks gestation of pregnancy: Secondary | ICD-10-CM | POA: Diagnosis not present

## 2017-02-20 DIAGNOSIS — O09523 Supervision of elderly multigravida, third trimester: Secondary | ICD-10-CM | POA: Diagnosis not present

## 2017-02-20 DIAGNOSIS — O99313 Alcohol use complicating pregnancy, third trimester: Secondary | ICD-10-CM | POA: Diagnosis not present

## 2017-02-22 ENCOUNTER — Encounter (HOSPITAL_COMMUNITY): Payer: Self-pay

## 2017-02-22 ENCOUNTER — Inpatient Hospital Stay (HOSPITAL_COMMUNITY)
Admission: RE | Admit: 2017-02-22 | Discharge: 2017-02-25 | DRG: 774 | Disposition: A | Discharge status: Home / Self Care | Payer: Medicaid Other | Source: Ambulatory Visit | Attending: Obstetrics & Gynecology | Admitting: Obstetrics & Gynecology

## 2017-02-22 ENCOUNTER — Inpatient Hospital Stay (HOSPITAL_COMMUNITY): Payer: Medicaid Other | Admitting: Anesthesiology

## 2017-02-22 VITALS — BP 112/70 | HR 88 | Temp 98.1°F | Resp 18 | Ht 67.0 in | Wt 188.0 lb

## 2017-02-22 DIAGNOSIS — D563 Thalassemia minor: Secondary | ICD-10-CM | POA: Diagnosis present

## 2017-02-22 DIAGNOSIS — O26893 Other specified pregnancy related conditions, third trimester: Secondary | ICD-10-CM | POA: Diagnosis present

## 2017-02-22 DIAGNOSIS — O99824 Streptococcus B carrier state complicating childbirth: Secondary | ICD-10-CM | POA: Diagnosis present

## 2017-02-22 DIAGNOSIS — R030 Elevated blood-pressure reading, without diagnosis of hypertension: Secondary | ICD-10-CM

## 2017-02-22 DIAGNOSIS — O9832 Other infections with a predominantly sexual mode of transmission complicating childbirth: Secondary | ICD-10-CM | POA: Diagnosis present

## 2017-02-22 DIAGNOSIS — L708 Other acne: Secondary | ICD-10-CM

## 2017-02-22 DIAGNOSIS — O365930 Maternal care for other known or suspected poor fetal growth, third trimester, not applicable or unspecified: Secondary | ICD-10-CM | POA: Diagnosis present

## 2017-02-22 DIAGNOSIS — O09523 Supervision of elderly multigravida, third trimester: Secondary | ICD-10-CM

## 2017-02-22 DIAGNOSIS — Z87891 Personal history of nicotine dependence: Secondary | ICD-10-CM

## 2017-02-22 DIAGNOSIS — O0990 Supervision of high risk pregnancy, unspecified, unspecified trimester: Secondary | ICD-10-CM

## 2017-02-22 DIAGNOSIS — O365931 Maternal care for other known or suspected poor fetal growth, third trimester, fetus 1: Secondary | ICD-10-CM

## 2017-02-22 DIAGNOSIS — A6000 Herpesviral infection of urogenital system, unspecified: Secondary | ICD-10-CM | POA: Diagnosis present

## 2017-02-22 DIAGNOSIS — R8271 Bacteriuria: Secondary | ICD-10-CM

## 2017-02-22 DIAGNOSIS — Z803 Family history of malignant neoplasm of breast: Secondary | ICD-10-CM

## 2017-02-22 DIAGNOSIS — O09529 Supervision of elderly multigravida, unspecified trimester: Secondary | ICD-10-CM

## 2017-02-22 DIAGNOSIS — F411 Generalized anxiety disorder: Secondary | ICD-10-CM

## 2017-02-22 DIAGNOSIS — F39 Unspecified mood [affective] disorder: Secondary | ICD-10-CM

## 2017-02-22 DIAGNOSIS — Z3A39 39 weeks gestation of pregnancy: Secondary | ICD-10-CM | POA: Diagnosis not present

## 2017-02-22 DIAGNOSIS — O9902 Anemia complicating childbirth: Secondary | ICD-10-CM | POA: Diagnosis present

## 2017-02-22 LAB — TYPE AND SCREEN
ABO/RH(D): A POS
Antibody Screen: NEGATIVE

## 2017-02-22 LAB — RPR: RPR Ser Ql: NONREACTIVE

## 2017-02-22 LAB — CBC
HCT: 32.4 % — ABNORMAL LOW (ref 36.0–46.0)
Hemoglobin: 10.7 g/dL — ABNORMAL LOW (ref 12.0–15.0)
MCH: 26.3 pg (ref 26.0–34.0)
MCHC: 33 g/dL (ref 30.0–36.0)
MCV: 79.6 fL (ref 78.0–100.0)
Platelets: 213 10*3/uL (ref 150–400)
RBC: 4.07 MIL/uL (ref 3.87–5.11)
RDW: 14.3 % (ref 11.5–15.5)
WBC: 8.7 10*3/uL (ref 4.0–10.5)

## 2017-02-22 MED ORDER — PHENYLEPHRINE 40 MCG/ML (10ML) SYRINGE FOR IV PUSH (FOR BLOOD PRESSURE SUPPORT): 80.0000 ug | PREFILLED_SYRINGE | INTRAVENOUS | Status: DC | PRN

## 2017-02-22 MED ORDER — MISOPROSTOL 200 MCG PO TABS
50.0000 ug | ORAL_TABLET | ORAL | Status: DC | PRN
  Administered 2017-02-22: 50 ug via ORAL
  Filled 2017-02-22: qty 1

## 2017-02-22 MED ORDER — ZOLPIDEM TARTRATE 5 MG PO TABS: 5.0000 mg | ORAL_TABLET | Freq: Every evening | ORAL | Status: DC | PRN

## 2017-02-22 MED ORDER — OXYCODONE-ACETAMINOPHEN 5-325 MG PO TABS: 1.0000 | ORAL_TABLET | ORAL | Status: DC | PRN

## 2017-02-22 MED ORDER — EPHEDRINE 5 MG/ML INJ: 10.0000 mg | INTRAVENOUS | Status: DC | PRN

## 2017-02-22 MED ORDER — LIDOCAINE HCL (PF) 1 % IJ SOLN
30.0000 mL | INTRAMUSCULAR | Status: DC | PRN
  Filled 2017-02-22: qty 30

## 2017-02-22 MED ORDER — FENTANYL CITRATE (PF) 100 MCG/2ML IJ SOLN
100.0000 ug | INTRAMUSCULAR | Status: DC | PRN
  Administered 2017-02-22 (×2): 100 ug via INTRAVENOUS
  Filled 2017-02-22 (×2): qty 2

## 2017-02-22 MED ORDER — OXYCODONE-ACETAMINOPHEN 5-325 MG PO TABS: 2.0000 | ORAL_TABLET | ORAL | Status: DC | PRN

## 2017-02-22 MED ORDER — OXYTOCIN 40 UNITS IN LACTATED RINGERS INFUSION - SIMPLE MED
1.0000 m[IU]/min | INTRAVENOUS | Status: DC
  Administered 2017-02-22: 2 m[IU]/min via INTRAVENOUS
  Filled 2017-02-22: qty 1000

## 2017-02-22 MED ORDER — FENTANYL 2.5 MCG/ML BUPIVACAINE 1/10 % EPIDURAL INFUSION (WH - ANES)
14.0000 mL/h | INTRAMUSCULAR | Status: DC | PRN
  Administered 2017-02-22 – 2017-02-23 (×3): 14 mL/h via EPIDURAL
  Filled 2017-02-22 (×2): qty 100

## 2017-02-22 MED ORDER — ONDANSETRON HCL 4 MG/2ML IJ SOLN: 4.0000 mg | Freq: Four times a day (QID) | INTRAMUSCULAR | Status: DC | PRN

## 2017-02-22 MED ORDER — OXYTOCIN 40 UNITS IN LACTATED RINGERS INFUSION - SIMPLE MED
2.5000 [IU]/h | INTRAVENOUS | Status: DC
  Administered 2017-02-23: 2.5 [IU]/h via INTRAVENOUS

## 2017-02-22 MED ORDER — LIDOCAINE HCL (PF) 1 % IJ SOLN
INTRAMUSCULAR | Status: DC | PRN
  Administered 2017-02-22: 6 mL via EPIDURAL
  Administered 2017-02-22: 4 mL

## 2017-02-22 MED ORDER — DEXTROSE 5 % IV SOLN
5.0000 10*6.[IU] | Freq: Once | INTRAVENOUS | Status: AC
  Administered 2017-02-22: 5 10*6.[IU] via INTRAVENOUS
  Filled 2017-02-22: qty 5

## 2017-02-22 MED ORDER — LACTATED RINGERS IV SOLN: 500.0000 mL | INTRAVENOUS | Status: DC | PRN

## 2017-02-22 MED ORDER — PENICILLIN G POT IN DEXTROSE 60000 UNIT/ML IV SOLN
3.0000 10*6.[IU] | INTRAVENOUS | Status: DC
  Administered 2017-02-22 – 2017-02-23 (×3): 3 10*6.[IU] via INTRAVENOUS
  Filled 2017-02-22 (×6): qty 50

## 2017-02-22 MED ORDER — LACTATED RINGERS IV SOLN
500.0000 mL | Freq: Once | INTRAVENOUS | Status: AC
  Administered 2017-02-22: 500 mL via INTRAVENOUS

## 2017-02-22 MED ORDER — PHENYLEPHRINE 40 MCG/ML (10ML) SYRINGE FOR IV PUSH (FOR BLOOD PRESSURE SUPPORT)
80.0000 ug | PREFILLED_SYRINGE | INTRAVENOUS | Status: DC | PRN
  Filled 2017-02-22: qty 10

## 2017-02-22 MED ORDER — TERBUTALINE SULFATE 1 MG/ML IJ SOLN
0.2500 mg | Freq: Once | INTRAMUSCULAR | Status: AC | PRN
  Administered 2017-02-22: 0.25 mg via SUBCUTANEOUS
  Filled 2017-02-22: qty 1

## 2017-02-22 MED ORDER — ACETAMINOPHEN 325 MG PO TABS
650.0000 mg | ORAL_TABLET | ORAL | Status: DC | PRN
  Administered 2017-02-23: 650 mg via ORAL
  Filled 2017-02-22: qty 2

## 2017-02-22 MED ORDER — DIPHENHYDRAMINE HCL 50 MG/ML IJ SOLN: 12.5000 mg | INTRAMUSCULAR | Status: DC | PRN

## 2017-02-22 MED ORDER — FLEET ENEMA 7-19 GM/118ML RE ENEM: 1.0000 | ENEMA | RECTAL | Status: DC | PRN

## 2017-02-22 MED ORDER — SOD CITRATE-CITRIC ACID 500-334 MG/5ML PO SOLN: 30.0000 mL | ORAL | Status: DC | PRN

## 2017-02-22 MED ORDER — OXYTOCIN 40 UNITS IN LACTATED RINGERS INFUSION - SIMPLE MED: 1.0000 m[IU]/min | INTRAVENOUS | Status: DC

## 2017-02-22 MED ORDER — OXYTOCIN BOLUS FROM INFUSION
500.0000 mL | Freq: Once | INTRAVENOUS | Status: AC
  Administered 2017-02-22: 666 m[IU]/min via INTRAVENOUS

## 2017-02-22 MED ORDER — LACTATED RINGERS IV SOLN
INTRAVENOUS | Status: DC
  Administered 2017-02-22 (×2): via INTRAVENOUS

## 2017-02-22 MED ORDER — MISOPROSTOL 25 MCG QUARTER TABLET
25.0000 ug | ORAL_TABLET | ORAL | Status: DC | PRN
  Administered 2017-02-22: 25 ug via VAGINAL
  Filled 2017-02-22 (×2): qty 1

## 2017-02-22 NOTE — Anesthesia Pain Management Evaluation Note (Signed)
  CRNA Pain Management Visit Note  Patient: Yesenia Weeks, 41 y.o., female  "Hello I am a [facility] of the anesthesia [facility] at [facility]. We have an anesthesia team available at all times to provide care throughout the hospital, including epidural management and anesthesia for C-section. I don't know your plan for the delivery whether it a natural birth, water birth, IV sedation, nitrous supplementation, doula or epidural, but we want to meet your pain goals."   1.Was your pain managed to your expectations on prior hospitalizations?   Yes   2.What is your expectation for pain management during this hospitalization?     Labor support without medications, Epidural, IV pain meds and Nitrous Oxide  3.How can we help you reach that goal? epidural  Record the patient's initial score and the patient's pain goal.   Pain: 4  Pain Goal: 10 [facility] wants you to be able to say your pain was always managed very well.  Donnis Phaneuf 02/22/2017

## 2017-02-22 NOTE — Anesthesia Preprocedure Evaluation (Addendum)
Anesthesia Evaluation  Patient identified by MRN, date of birth, ID band Patient awake    Reviewed: Allergy & Precautions, H&P , NPO status , Patient's Chart, lab work & pertinent test results  Airway Mallampati: I  TM Distance: >3 FB Neck ROM: full    Dental no notable dental hx. (+) Teeth Intact   Pulmonary neg pulmonary ROS, former smoker,    Pulmonary exam normal breath sounds clear to auscultation       Cardiovascular negative cardio ROS Normal cardiovascular exam Rhythm:regular Rate:Normal     Neuro/Psych negative neurological ROS     GI/Hepatic negative GI ROS, Neg liver ROS,   Endo/Other  negative endocrine ROS  Renal/GU negative Renal ROS     Musculoskeletal negative musculoskeletal ROS (+)   Abdominal Normal abdominal exam  (+)   Peds  Hematology  (+) Blood dyscrasia, anemia ,   Anesthesia Other Findings   Reproductive/Obstetrics (+) Pregnancy                             Anesthesia Physical Anesthesia Plan  ASA: II  Anesthesia Plan: Epidural   Post-op Pain Management:    Induction:   PONV Risk Score and Plan:   Airway Management Planned:   Additional Equipment:   Intra-op Plan:   Post-operative Plan:   Informed Consent: I have reviewed the patients History and Physical, chart, labs and discussed the procedure including the risks, benefits and alternatives for the proposed anesthesia with the patient or authorized representative who has indicated his/her understanding and acceptance.     Plan Discussed with:   Anesthesia Plan Comments:         Anesthesia Quick Evaluation

## 2017-02-22 NOTE — Progress Notes (Signed)
LABOR PROGRESS NOTE  Yesenia Weeks is a 41 y.o. [id number] at 6243w0d  admitted for IOL for AMA  Subjective: Feeling frequent contractions. Pain well controlled  Objective: BP 136/88   Pulse 85   Temp 99.3 F (37.4 C) (Oral)   Resp 18   Ht 5\' 7"  (1.702 m)   Wt 188 lb (85.3 kg)   LMP 07/03/2016   BMI 29.44 kg/m  or  Vitals:   02/22/17 1930 02/22/17 2000 02/22/17 2030 02/22/17 2100  BP: 130/75 (!) 132/59 (!) 142/64 136/88  Pulse: 82 75 68 85  Resp:   18   Temp:   99.3 F (37.4 C)   TempSrc:   Oral   Weight:      Height:        SVE: Dilation: 4.5 Effacement (%): 60 Station: Ballotable Presentation: Vertex Exam by:: Karl Itoiana Ansah-mensah, rnc   FHT: baseline rate 130, moderate varibility, +acel, variable, and few lates Toco: q1-2 min   Assessment / Plan: 41 y.o. [id number] at 2343w0d here for IOL for AMA.   Labor: s/p cytotec and FB. Cat 2 with tacy-sys contractions. Plan to pause Pit and administer terbutaline Fetal Wellbeing:  Cat II Pain Control:  IV pain meds. Planning on epidural Anticipated MOD:  SVD  John Giovanniorey P Rito Lecomte, MD 02/22/2017, 9:30 PM

## 2017-02-22 NOTE — Progress Notes (Signed)
LABOR PROGRESS NOTE  Yesenia Weeks is a [age] y.o. [id number] at 4540w0d  admitted for IOL for AMA  Subjective: Patient denies any concerns   Objective: BP 100/61   Pulse 84   Temp 98.3 F (36.8 C) (Oral)   Resp 16   Ht 5\' 7"  (1.702 m)   Wt 188 lb (85.3 kg)   LMP 07/03/2016   BMI 29.44 kg/m  or  Vitals:   02/22/17 0330 02/22/17 0331 02/22/17 0403 02/22/17 0501  BP:  120/80 126/81 100/61  Pulse:  84 83 84  Resp:   16 16  Temp: 98.3 F (36.8 C)     TempSrc: Oral     Weight:      Height:        SVE: 1.5/50/-3 FHT: baseline rate 125, moderate varibility, +acel, no decel Toco: irregular  Labs: Lab Results  Component Value Date   WBC 8.7 02/22/2017   HGB 10.7 (L) 02/22/2017   HCT 32.4 (L) 02/22/2017   MCV 79.6 02/22/2017   PLT 213 02/22/2017    Patient Active Problem List   Diagnosis Date Noted  . Small for gestational age fetus affecting management of mother, third trimester, fetus 1 01/30/2017  . Supervision of high risk pregnancy, antepartum 10/18/2016  . AMA (advanced maternal age) multigravida 35+ 09/21/2016  . GBS bacteriuria 09/06/2016  . Anxiety state 08/31/2016  . Family history of breast cancer 03/31/2014  . Genital herpes 10/26/2013  . ACNE, MILD 09/10/2008  . ASTEATOTIC ECZEMA 09/10/2008  . HALLUX VALGUS, ACQUIRED 11/06/2006  . ANEMIA, IRON DEFICIENCY, UNSPEC. 09/19/2006  . Mood disorder (HCC) 09/19/2006  . RHINITIS, ALLERGIC 09/19/2006    Assessment / Plan: 41 y.o. [id number] at 6240w0d here for IOL for AMA.   Labor: cervical ripening with FB and cytotec. FB and cytotec #2 placed at 10am Fetal Wellbeing:  Cat I Pain Control:  IV pain meds Anticipated MOD:  SVD  Frederik PearJulie P Dwan Hemmelgarn, MD 02/22/2017, 10:11 AM

## 2017-02-22 NOTE — Progress Notes (Signed)
LABOR PROGRESS NOTE  Yesenia Weeks is a 41 y.o. [id number] at 6733w0d  admitted for IOL for AMA  Subjective: Starting to feel contractions. Denies any other conern s  Objective: BP 124/82   Pulse 91   Temp 98.6 F (37 C) (Oral)   Resp 18   Ht 5\' 7"  (1.702 m)   Wt 188 lb (85.3 kg)   LMP 07/03/2016   BMI 29.44 kg/m  or  Vitals:   02/22/17 1100 02/22/17 1200 02/22/17 1308 02/22/17 1401  BP:   124/82   Pulse:   91   Resp: 18 16 18 18   Temp:      TempSrc:      Weight:      Height:        SVE: 4.5/60/-3 FHT: baseline rate 130, moderate varibility, +acel, no decel Toco: q2-5 min   Assessment / Plan: 41 y.o. A5W0981G4P2102 at 6633w0d here for IOL for AMA.   Labor: s/p cytotec and FB. Will start Pitocin  Fetal Wellbeing:  Cat I Pain Control:  IV pain meds. Planning on epidural Anticipated MOD:  SVD  Frederik PearJulie P Charlynn Salih, MD 02/22/2017, 3:38 PM

## 2017-02-22 NOTE — Anesthesia Procedure Notes (Signed)
Epidural Patient location during procedure: OB  Staffing Anesthesiologist: Jakelin Taussig  Preanesthetic Checklist Completed: patient identified, pre-op evaluation, timeout performed, IV checked, risks and benefits discussed and monitors and equipment checked  Epidural Patient position: sitting Prep: DuraPrep Patient monitoring: blood pressure and continuous pulse ox Approach: right paramedian Location: L3-L4 Injection technique: LOR air  Needle:  Needle type: Tuohy  Needle gauge: 17 G Needle insertion depth: 5 cm Catheter type: closed end flexible Catheter size: 19 Gauge Catheter at skin depth: 10 cm Test dose: negative  Assessment Sensory level: T8  Additional Notes    Dosing of Epidural:  1st dose, through catheter .............................................  Xylocaine 40 mg  2nd dose, through catheter, after waiting 3 minutes.........Xylocaine 60 mg    As each dose occurred, patient was free of IV sx; and patient exhibited no evidence of SA injection.  Patient is more comfortable after epidural dosed. Please see RN's note for documentation of vital signs,and FHR which are stable.  Patient reminded not to try to ambulate with numb legs, and that an RN must be present when she attempts to get up.         

## 2017-02-22 NOTE — H&P (Signed)
Yesenia Weeks is a 41 y.o. female [phone] with IUP at 4167w0d presenting for IOL for AMA. Pt states she has been having none contractions, associated with none vaginal bleeding for no hours..  Membranes are intact, with active fetal movement.   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 ADHD (attention deficit hyperactivity disorder)   . Allergy   . Anemia    iron def  . Anxiety   . BV (bacterial vaginosis)    recurrent-uses boric acid  . Depression   . Dysmenorrhea   . Substance abuse     Past Surgical History: Past Surgical History:  Procedure Laterality Date  . HERNIA REPAIR     x2    Obstetrical History: OB History    Gravida Para Term Preterm AB Living   4 3 2 1  0 2   SAB TAB Ectopic Multiple Live Births   0       2       Social History: Social History   Social History  . Marital status: Single    Spouse name: N/A  . Number of children: 2  . Years of education: 11   Occupational History  . [employer]    Social History Main Topics  . Smoking status: Former Smoker    Types: Cigarettes    Quit date: 08/13/2016  . Smokeless tobacco: Never Used  . Alcohol use Yes     Comment: 40 onces of beer a night/stopped after +UPT  . Drug use: Yes    Types: Cocaine     Comment: prior to positive pregnancy test  . Sexual activity: Not Currently    Birth control/ protection: None   Other Topics Concern  . None   Social History Narrative  . None    Family History: Family History  Problem Relation Age of Onset  . Cancer Maternal Aunt        breast  . Diabetes Maternal Grandmother   . Hypertension Maternal Grandmother   . Mental illness Maternal Grandmother   . Cancer Maternal Grandmother   . Diabetes Paternal Grandmother   . Kidney disease Paternal  Grandmother   . Mental illness Paternal Grandmother   . Hypertension Paternal Grandmother     Allergies: No Known Allergies  Prescriptions Prior to Admission  Medication Sig Dispense Refill Last Dose  . cetirizine (ZYRTEC ALLERGY) 10 MG tablet Take 1 tablet (10 mg total) by mouth daily. 30 tablet 3 Past Week at Unknown time  . Prenatal Vit-Fe Fumarate-FA (PRENATAL MULTIVITAMIN) TABS tablet Take 1 tablet by mouth daily at 12 noon.   02/21/2017 at Unknown time  . valACYclovir (VALTREX) 500 MG tablet Take 1 tablet (500 mg total) by mouth 2 (two) times daily. 60 tablet 6 02/21/2017 at Unknown time        Review of Systems   Constitutional: Negative for fever and chills Eyes: Negative for visual disturbances Respiratory: Negative for shortness of breath, dyspnea Cardiovascular: Negative for chest pain or palpitations  Gastrointestinal: Negative for vomiting, diarrhea and constipation.  POSITIVE for abdominal pain (contractions) Genitourinary: Negative for dysuria and urgency Musculoskeletal: Negative for back pain, joint pain, myalgias  Neurological: Negative for dizziness and headaches      Blood pressure 100/61, pulse 84, temperature 98.3 F (36.8 C), temperature source Oral, resp. rate 16, height 5\' 7"  (  1.702 m), weight 188 lb (85.3 kg), last menstrual period 07/03/2016. General appearance: alert, cooperative and no distress Lungs: clear to auscultation bilaterally Heart: regular rate and rhythm Abdomen: soft, non-tender; bowel sounds normal Extremities: Homans sign is negative, no sign of DVT DTR's 2+ Presentation: cephalic Fetal monitoring  Baseline: 120 bpm Uterine activity  Irregular, 1 in 10 min Dilation: Fingertip Effacement (%): 50 Station: -3 Exam by:: Yesenia Mustassie Stewart, RN  Careful visual inspection of vulva and vagina reveals no signs of herpetic lesions; patient denies any outbreaks in this pregnancy.   Prenatal labs: ABO, Rh: --/--/A POS (08/03 0230) Antibody:  NEG (08/03 0230) Rubella: !Error! 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 Anatomy US normal, SGA (EFW was 6 lbs 14 oz on 02-20-2017)  Prenatal Transfer Tool  Maternal Diabetes: No Genetic Screening: Normal Maternal Ultrasounds/Referrals: Normal Fetal Ultrasounds or other Referrals:  Other: Fetal echo, followed by MFM for SGA Maternal Substance Abuse:  Yes:  Type: Cocaine early in first trimester Significant Maternal Medications:  None Significant Maternal Lab Results: None     Results for orders placed or performed during the hospital encounter of 02/22/17 (from the past 24 hour(s))  CBC   Collection Time: 02/22/17  2:25 AM  Result Value Ref Range   WBC 8.7 4.0 - 10.5 K/uL   RBC 4.07 3.87 - 5.11 MIL/uL   Hemoglobin 10.7 (L) 12.0 - 15.0 g/dL   HCT 08.632.4 (L) 57.836.0 - 46.946.0 %   MCV 79.6 78.0 - 100.0 fL   MCH 26.3 26.0 - 34.0 pg   MCHC 33.0 30.0 - 36.0 g/dL   RDW 62.914.3 52.811.5 - 41.315.5 %   Platelets 213 150 - 400 K/uL  Type and screen [facility]   Collection Time: 02/22/17  2:30 AM  Result Value Ref Range   ABO/RH(D) A POS    Antibody Screen NEG    Sample Expiration 02/25/2017     Assessment: [name] is a 41 y.o. [id number] with an IUP at 5453w0d presenting for IOL for AMA No signs of herpetic lesions at this time.   Plan: #Labor: cytotec and Foley Bulb #Pain:  Per request #FWB Cat 1 #ID: GBS: postiive, will start with PCN once active labor.  #MOF:  Breast and bottle #MOC: birth control pills #Circ: planning outpatient   Charlesetta GaribaldiKathryn Lorraine Kooistra CNM 02/22/2017, 5:12 AM

## 2017-02-23 ENCOUNTER — Encounter (HOSPITAL_COMMUNITY): Payer: Self-pay

## 2017-02-23 DIAGNOSIS — Z3A39 39 weeks gestation of pregnancy: Secondary | ICD-10-CM

## 2017-02-23 DIAGNOSIS — O99824 Streptococcus B carrier state complicating childbirth: Secondary | ICD-10-CM

## 2017-02-23 MED ORDER — SODIUM CHLORIDE 0.9% FLUSH: 3.0000 mL | Freq: Two times a day (BID) | INTRAVENOUS | Status: DC

## 2017-02-23 MED ORDER — COCONUT OIL OIL: 1.0000 "application " | TOPICAL_OIL | Status: DC | PRN

## 2017-02-23 MED ORDER — ONDANSETRON HCL 4 MG/2ML IJ SOLN: 4.0000 mg | INTRAMUSCULAR | Status: DC | PRN

## 2017-02-23 MED ORDER — DIPHENHYDRAMINE HCL 25 MG PO CAPS: 25.0000 mg | ORAL_CAPSULE | Freq: Four times a day (QID) | ORAL | Status: DC | PRN

## 2017-02-23 MED ORDER — SENNOSIDES-DOCUSATE SODIUM 8.6-50 MG PO TABS
2.0000 | ORAL_TABLET | ORAL | Status: DC
  Administered 2017-02-25: 2 via ORAL
  Filled 2017-02-23 (×2): qty 2

## 2017-02-23 MED ORDER — OXYCODONE-ACETAMINOPHEN 5-325 MG PO TABS: 2.0000 | ORAL_TABLET | ORAL | Status: DC | PRN

## 2017-02-23 MED ORDER — MEASLES, MUMPS & RUBELLA VAC ~~LOC~~ INJ
0.5000 mL | INJECTION | Freq: Once | SUBCUTANEOUS | Status: DC
  Filled 2017-02-23: qty 0.5

## 2017-02-23 MED ORDER — ACETAMINOPHEN 325 MG PO TABS
650.0000 mg | ORAL_TABLET | ORAL | Status: DC | PRN
  Administered 2017-02-23 – 2017-02-24 (×3): 650 mg via ORAL
  Filled 2017-02-23 (×3): qty 2

## 2017-02-23 MED ORDER — ONDANSETRON HCL 4 MG PO TABS: 4.0000 mg | ORAL_TABLET | ORAL | Status: DC | PRN

## 2017-02-23 MED ORDER — SODIUM CHLORIDE 0.9 % IV SOLN: 250.0000 mL | INTRAVENOUS | Status: DC | PRN

## 2017-02-23 MED ORDER — SODIUM CHLORIDE 0.9% FLUSH: 3.0000 mL | INTRAVENOUS | Status: DC | PRN

## 2017-02-23 MED ORDER — ZOLPIDEM TARTRATE 5 MG PO TABS: 5.0000 mg | ORAL_TABLET | Freq: Every evening | ORAL | Status: DC | PRN

## 2017-02-23 MED ORDER — OXYCODONE-ACETAMINOPHEN 5-325 MG PO TABS: 1.0000 | ORAL_TABLET | ORAL | Status: DC | PRN

## 2017-02-23 MED ORDER — IBUPROFEN 600 MG PO TABS
600.0000 mg | ORAL_TABLET | Freq: Four times a day (QID) | ORAL | Status: DC
  Administered 2017-02-23 – 2017-02-25 (×10): 600 mg via ORAL
  Filled 2017-02-23 (×10): qty 1

## 2017-02-23 MED ORDER — PRENATAL MULTIVITAMIN CH
1.0000 | ORAL_TABLET | Freq: Every day | ORAL | Status: DC
  Administered 2017-02-23 – 2017-02-25 (×3): 1 via ORAL
  Filled 2017-02-23 (×3): qty 1

## 2017-02-23 MED ORDER — MAGNESIUM HYDROXIDE 400 MG/5ML PO SUSP: 30.0000 mL | ORAL | Status: DC | PRN

## 2017-02-23 MED ORDER — SIMETHICONE 80 MG PO CHEW: 80.0000 mg | CHEWABLE_TABLET | ORAL | Status: DC | PRN

## 2017-02-23 MED ORDER — WITCH HAZEL-GLYCERIN EX PADS: 1.0000 "application " | MEDICATED_PAD | CUTANEOUS | Status: DC | PRN

## 2017-02-23 MED ORDER — OXYTOCIN 40 UNITS IN LACTATED RINGERS INFUSION - SIMPLE MED: 2.5000 [IU]/h | INTRAVENOUS | Status: DC | PRN

## 2017-02-23 MED ORDER — TETANUS-DIPHTH-ACELL PERTUSSIS 5-2.5-18.5 LF-MCG/0.5 IM SUSP: 0.5000 mL | Freq: Once | INTRAMUSCULAR | Status: DC

## 2017-02-23 MED ORDER — BENZOCAINE-MENTHOL 20-0.5 % EX AERO: 1.0000 "application " | INHALATION_SPRAY | CUTANEOUS | Status: DC | PRN

## 2017-02-23 MED ORDER — DOCUSATE SODIUM 100 MG PO CAPS
100.0000 mg | ORAL_CAPSULE | Freq: Two times a day (BID) | ORAL | Status: DC
  Administered 2017-02-24 – 2017-02-25 (×2): 100 mg via ORAL
  Filled 2017-02-23 (×3): qty 1

## 2017-02-23 MED ORDER — DIBUCAINE 1 % RE OINT: 1.0000 "application " | TOPICAL_OINTMENT | RECTAL | Status: DC | PRN

## 2017-02-23 NOTE — Anesthesia Postprocedure Evaluation (Signed)
Anesthesia Post Note  Patient: Tilley Pikus  Procedure(s) Performed: * No procedures listed *     Patient location during evaluation: Mother Baby Anesthesia Type: Epidural Level of consciousness: awake and alert and oriented Pain management: satisfactory to patient Vital Signs Assessment: post-procedure vital signs reviewed and stable Respiratory status: spontaneous breathing and nonlabored ventilation Cardiovascular status: stable Postop Assessment: no headache, no backache, no signs of nausea or vomiting, adequate PO intake and patient able to bend at knees (patient up walking) Anesthetic complications: no    Last Vitals:  Vitals:   02/23/17 0723 02/23/17 1137  BP: 122/69 119/69  Pulse: 71 79  Resp: 19 20  Temp: 36.7 C 36.5 C    Last Pain:  Vitals:   02/23/17 1137  TempSrc: Oral  PainSc:    Pain Goal:                 Ailee Pates

## 2017-02-23 NOTE — Progress Notes (Signed)
LABOR PROGRESS NOTE  Sanae Jerilee FieldBoykin is a 41 y.o. [id number] at [redacted]w[redacted]d  admitted for IOL for AMA  Subjective: Pain continued to be well controlled  Objective: BP 111/69   Pulse 79   Temp 99.1 F (37.3 C) (Oral)   Resp 18   Ht 5\' 7"  (1.702 m)   Wt 188 lb (85.3 kg)   LMP 07/03/2016   BMI 29.44 kg/m  or  Vitals:   02/23/17 0030 02/23/17 0100 02/23/17 0130 02/23/17 0200  BP: 113/60 107/63 110/62 111/69  Pulse: 80 80 73 79  Resp:      Temp:      TempSrc:      Weight:      Height:        SVE: Dilation: 5.5 Effacement (%): 90 Station: -2 Presentation: Vertex Exam by:: Margarita Grizzleiana Ansah-mensah,rnc   FHT: baseline rate 140, moderate varibility, +acel, variable, and few lates Toco: q2-4 min   Assessment / Plan: 41 y.o. [id number] at [redacted]w[redacted]d here for IOL for AMA.   Labor: s/p cytotec and FB. Cat 2 with tacy-sys contractions. Continue with slow titration of pit Fetal Wellbeing:  Cat II Pain Control:  IV pain meds. Planning on epidural Anticipated MOD:  SVD  John Giovanniorey P Ananda Sitzer, MD 02/23/2017, 2:26 AM

## 2017-02-24 NOTE — Progress Notes (Signed)
CSW received consult for hx of Anxiety and Depression.  CSW met with MOB to offer support and complete assessment.    Upon this writers arrival, MOB was warm and welcoming.She was accompanied by FOB who was noted to be supporting baby and MOB well. This writer inquired about hx of anxiety and depression. MOB notes this is an old dx and something she has dealt with for awhile. CSW inquired if MOB has ever had behavioral health follow-up or is interested in resources. MOB noted she is willing to take resources but currently feels fine.   CSW provided education regarding the baby blues period vs. perinatal mood disorders, discussed treatment and gave resources for mental health follow up if concerns arise.  CSW recommends self-evaluation during the postpartum time period using the New Mom Checklist from Postpartum Progress and encouraged MOB to contact a medical professional if symptoms are noted at any time.   CSW provided review of Sudden Infant Death Syndrome (SIDS) precautions.   CSW identifies no further need for intervention and no barriers to discharge at this time.  Jadore Veals, MSW, LCSW-A Clinical Social Worker  [facility]  Office: [phone]   

## 2017-02-24 NOTE — Progress Notes (Signed)
Post Partum Day 1 Subjective: no complaints, up ad lib, voiding and tolerating PO  Objective: Blood pressure (!) 142/76, pulse 73, temperature 98.2 F (36.8 C), temperature source Oral, resp. rate 20, height 5\' 7"  (1.702 m), weight 188 lb (85.3 kg), last menstrual period 07/03/2016, unknown if currently breastfeeding.  Physical Exam:  General: alert, cooperative, appears stated age and no distress Lochia: appropriate Uterine Fundus: firm Incision: n/a DVT Evaluation: No evidence of DVT seen on physical exam.   Recent Labs  02/22/17 0225  HGB 10.7*  HCT 32.4*    Assessment/Plan: Plan for discharge tomorrow   LOS: 2 days   Yesenia Weeks 02/24/2017, 10:00 AM

## 2017-02-24 NOTE — Lactation Note (Signed)
This note was copied from a baby's chart. Lactation Consultation Note Mom has an 40 yr old that she BF for 1-2 months, her 2nd child now 40 yrs old BF for 15 months w/o difficulty. Mom plans on BF this baby at least 15 months or longer. Mom has cone shaped breast w/everted nipples that the baby latches well to easily. Baby was latched in cradle position but pulling down on nipple. Didn't have good body alignment and wrapped in blankets. Encouraged STS. Assisted in football hold. Discussed comfort, support, props, safety d/t sleepiness, breast compression, I&O, and cluster feeding. Heard audible swallows.  Mom encouraged to feed baby 8-12 times/24 hours and with feeding cues. Wake baby if hasn't cued in 3 hrs. Mom has WIC. WH/LC brochure given w/resources, support groups and LC services. Encouraged to call for assistance or concerns.  Patient Name: Yesenia Weeks Today's Date: 02/24/2017 Reason for consult: Initial assessment   Maternal Data Formula Feeding for Exclusion: Yes Reason for exclusion: Mother's choice to formula and breast feed on admission Has patient been taught Hand Expression?: Yes Does the patient have breastfeeding experience prior to this delivery?: Yes  Feeding Feeding Type: Breast Fed Length of feed: 20 min (still BF)  LATCH Score Latch: Grasps breast easily, tongue down, lips flanged, rhythmical sucking.  Audible Swallowing: Spontaneous and intermittent  Type of Nipple: Everted at rest and after stimulation  Comfort (Breast/Nipple): Soft / non-tender  Hold (Positioning): Assistance needed to correctly position infant at breast and maintain latch.  LATCH Score: 9  Interventions Interventions: Breast feeding basics reviewed;Support pillows;Assisted with latch;Position options;Skin to skin;Expressed milk;Breast massage;Hand express;Breast compression;Adjust position  Lactation Tools Discussed/Used     Consult Status Consult Status: Follow-up Date:  02/24/17 (in pm) Follow-up type: In-patient    Charyl DancerCARVER, Maris Abascal G 02/24/2017, 12:58 AM

## 2017-02-25 ENCOUNTER — Encounter (HOSPITAL_COMMUNITY): Payer: Self-pay

## 2017-02-25 MED ORDER — IBUPROFEN 600 MG PO TABS: 600.0000 mg | ORAL_TABLET | Freq: Four times a day (QID) | ORAL | 0 refills | Status: DC | PRN

## 2017-02-25 NOTE — Progress Notes (Signed)
Resident notified of pt BP status and new onset of HA unrelieved by medication.  Orders given to increase frequency of BP and continue to assess symptoms.  Pt DC order placed, however pt to be reevaluated prior to DC.

## 2017-02-25 NOTE — Discharge Instructions (Signed)

## 2017-02-25 NOTE — Discharge Summary (Signed)
OB Discharge Summary     Patient Name: Yesenia Weeks DOB: 1975/12/24 MRN: [id number]  Date of admission: 02/22/2017 Delivering MD: Pincus LargePHELPS, JAZMA Y   Date of discharge: 02/25/2017  Admitting diagnosis: 39WKS INDUCTION Intrauterine pregnancy: 786w1d     Secondary diagnosis:  Active Problems:   AMA (advanced maternal age) multigravida 35+  Additional problems: SGA; GBS pos     Discharge diagnosis: Term Pregnancy Delivered                                                                                                Post partum procedures:none  Augmentation: Pitocin, Cytotec and Foley Balloon  Complications: None  Hospital course:  Induction of Labor With Vaginal Delivery   41 y.o. yo [phone] at 5786w1d was admitted to the hospital 02/22/2017 for induction of labor.  Indication for induction: AMA.  Patient had an uncomplicated labor course as follows: Membrane Rupture Time/Date: 11:40 PM ,02/22/2017   Intrapartum Procedures: Episiotomy: None [1]                                         Lacerations:  1st degree [2];Perineal [11]  Patient had delivery of a Viable infant.  Information for the patient's newborn:  Nilsa NuttingBoykin, Boy Julizza [[id number]][[id number]]  Delivery Method: Vaginal, Spontaneous Delivery (Filed from Delivery Summary)   02/23/2017  Details of delivery can be found in separate delivery note.  Patient had a routine postpartum course. Patient is discharged home 02/25/17. She had some borderline elevated BPs intermittently during her PP stay, so will have a BP check in 1 week by the [facility] nurse.  Physical exam  Vitals:   02/23/17 1857 02/24/17 0602 02/24/17 1909 02/25/17 0610  BP: 122/79 (!) 142/76 (!) 144/76 138/81  Pulse: 78 73 81 84  Resp: 20 20 20 18   Temp: 98.1 F (36.7 C) 98.2 F (36.8 C) 98.5 F (36.9 C) 98.1 F (36.7 C)  TempSrc: Oral Oral Oral Oral  Weight:      Height:       General: alert and cooperative Lochia: appropriate Uterine Fundus: firm Incision: N/A DVT  Evaluation: No evidence of DVT seen on physical exam. Labs: Lab Results  Component Value Date   WBC 8.7 02/22/2017   HGB 10.7 (L) 02/22/2017   HCT 32.4 (L) 02/22/2017   MCV 79.6 02/22/2017   PLT 213 02/22/2017   CMP Latest Ref Rng & Units 03/31/2014  Glucose 70 - 99 mg/dL 87  BUN 6 - 23 mg/dL 16  Creatinine 1.300.50 - 8.651.10 mg/dL 7.840.90  Sodium 696135 - 295145 mEq/L 137  Potassium 3.5 - 5.3 mEq/L 4.3  Chloride 96 - 112 mEq/L 104  CO2 19 - 32 mEq/L 24  Calcium 8.4 - 10.5 mg/dL 9.4  Total Protein 6.0 - 8.3 g/dL 7.5  Total Bilirubin 0.2 - 1.2 mg/dL 0.4  Alkaline Phos 39 - 117 U/L 75  AST 0 - 37 U/L 21  ALT 0 - 35 U/L 20    Discharge instruction: per  After Visit Summary and "Baby and Me Booklet".  After visit meds:  Allergies as of 02/25/2017   No Known Allergies     Medication List    STOP taking these medications   cetirizine 10 MG tablet Commonly known as:  ZYRTEC ALLERGY   valACYclovir 500 MG tablet Commonly known as:  VALTREX     TAKE these medications   ibuprofen 600 MG tablet Commonly known as:  ADVIL,MOTRIN Take 1 tablet (600 mg total) by mouth every 6 (six) hours as needed.   prenatal multivitamin Tabs tablet Take 1 tablet by mouth daily at 12 noon.       Diet: routine diet  Activity: Advance as tolerated. Pelvic rest for 6 weeks.   Outpatient follow up:BP check by [facility] in 1 week, and pp visit in 4 weeks Follow up Appt:Future Appointments Date Time Provider Department Center  03/20/2017 10:40 AM Dorathy Kinsman, [place] [facility]   Follow up Visit:No Follow-up on file.  Postpartum contraception: Progesterone only pills  Newborn Data: Live born female  Birth Weight: 6 lb 7.7 oz (2940 g) APGAR: 9, 10  Baby Feeding: Breast Disposition:home with mother   02/25/2017 Cam Hai, CNM  8:18 AM

## 2017-02-25 NOTE — Lactation Note (Signed)
This note was copied from a baby's chart. Lactation Consultation Note: Mother voices concerns that infant is spitting up frequently. She reports 4-5 times since birth. Mother reports that infant suckles for a few mins and then falls asleep. Mothers breast are filling. She was sat up with a DEBP and pumped approx 80 ml. Mother advised to breastfeed infant and burp well. Mother to cue base feed and at least 8-12 times in 24 hours.  Observed that infant latched well but chomps on mothers nipple causing a #4 pain scale. Taught parents how to flange infants lips for wider gape. Infant sustained latch for 10 mins. Mother taught to do breast compression as needed.  Infant was given 10 ml of ebm with a curved tip syringe and gloved finger. Assessed infants mouth with a gloved finger . Infant has a short anterior frenula. Mother reports that Peds discussed this with her and informed her that frenula could be clipped while in the hospital. Mother is active with WIC . She was given a hand pump and [facility]. Mother to page for staff nurse if infant spits up.   Patient Name: Yesenia Weeks Today's Date: 02/25/2017 Reason for consult: Follow-up assessment   Maternal Data    Feeding Feeding Type: Breast Milk Length of feed: 10 min  LATCH Score Latch: Grasps breast easily, tongue down, lips flanged, rhythmical sucking.  Audible Swallowing: A few with stimulation (observed chewing)  Type of Nipple: Everted at rest and after stimulation  Comfort (Breast/Nipple): Filling, red/small blisters or bruises, mild/mod discomfort  Hold (Positioning): Assistance needed to correctly position infant at breast and maintain latch.  LATCH Score: 7  Interventions    Lactation Tools Discussed/Used     Consult Status Consult Status: Follow-up Date: 02/25/17 Follow-up type: In-patient    Stevan BornKendrick, Loneta Tamplin [facility] 02/25/2017, 12:15 PM

## 2017-02-25 NOTE — Progress Notes (Signed)
MD notified of pt repeat BP WNL.  Order placed to DC pt with education on what to look for postpartum that may indicate increased BP and when to follow up with MD for blood pressure check.

## 2017-02-26 ENCOUNTER — Ambulatory Visit: Payer: Self-pay

## 2017-02-26 NOTE — Lactation Note (Signed)
This note was copied from a baby's chart. Lactation Consultation Note: Infant returned from nursery after frenectomy. Infant opened wide for a few sucks. Infant very sleep. Reviewed cross cradle and football hold. Mothers nipples round without trauma. Mother advised to page staff for next feeding assessment.   Patient Name: Yesenia Weeks Today's Date: 02/26/2017 Reason for consult: Follow-up assessment   Maternal Data    Feeding Feeding Type: Breast Fed Length of feed: 15 min  LATCH Score Latch: Grasps breast easily, tongue down, lips flanged, rhythmical sucking.  Audible Swallowing: A few with stimulation (few sucks infant very sleepy)  Type of Nipple: Everted at rest and after stimulation  Comfort (Breast/Nipple): Filling, red/small blisters or bruises, mild/mod discomfort  Hold (Positioning): Assistance needed to correctly position infant at breast and maintain latch.  LATCH Score: 7  Interventions    Lactation Tools Discussed/Used     Consult Status Consult Status: Complete    Michel BickersKendrick, Britanny Marksberry McCoy 02/26/2017, 4:08 PM

## 2017-02-26 NOTE — Lactation Note (Signed)
This note was copied from a baby's chart. Lactation Consultation Note: Observed infant breastfeeding in cradle hold,. Frequent suckling and audible swallows observed. [facility] WIC in to certify mother for services today. [facility]. Advised mother to use hand pump as needed. Mother to page for latch to be observed after tongue clipped.   Patient Name: Yesenia Weeks Today's Date: 02/26/2017 Reason for consult: Follow-up assessment   Maternal Data    Feeding Feeding Type: Breast Fed Length of feed: 15 min  LATCH Score Latch: Grasps breast easily, tongue down, lips flanged, rhythmical sucking.  Audible Swallowing: Spontaneous and intermittent  Type of Nipple: Everted at rest and after stimulation  Comfort (Breast/Nipple): Filling, red/small blisters or bruises, mild/mod discomfort (c/o of nipple discomfort with latch)  Hold (Positioning): No assistance needed to correctly position infant at breast.  LATCH Score: 9  Interventions    Lactation Tools Discussed/Used     Consult Status Consult Status: Complete    Yesenia Weeks, Yesenia Weeks 02/26/2017, 3:14 PM

## 2017-02-26 NOTE — Lactation Note (Signed)
This note was copied from a baby's chart. Lactation Consultation Note: [facility] Paperwork given. [facility] observed feeding after frenectomy. Mother reports that she still has tenderness when infant is latched. Mothers milk in and breast are full before feeding and soften after feeding. Discussed that infant may continue to cluster feed, this is normal newborn behavior.   Patient Name: Yesenia Weeks Today's Date: 02/26/2017     Maternal Data    Feeding    LATCH Score                   Interventions    Lactation Tools Discussed/Used     Consult Status      Yesenia BornKendrick, Yesenia Weeks [facility] 02/26/2017, 11:04 AM

## 2017-02-28 ENCOUNTER — Encounter: Payer: Self-pay | Admitting: Internal Medicine

## 2017-02-28 ENCOUNTER — Ambulatory Visit (INDEPENDENT_AMBULATORY_CARE_PROVIDER_SITE_OTHER): Payer: Medicaid Other | Admitting: Internal Medicine

## 2017-02-28 ENCOUNTER — Telehealth (HOSPITAL_COMMUNITY): Payer: Self-pay

## 2017-02-28 VITALS — BP 118/72 | HR 72 | Temp 98.5°F | Ht 67.0 in | Wt 181.8 lb

## 2017-02-28 DIAGNOSIS — M654 Radial styloid tenosynovitis [de Quervain]: Secondary | ICD-10-CM

## 2017-02-28 MED ORDER — DICLOFENAC SODIUM 1 % TD GEL: 2.0000 g | Freq: Three times a day (TID) | TRANSDERMAL | 0 refills | Status: DC | PRN

## 2017-02-28 NOTE — Patient Instructions (Addendum)
ICE the area as noted below, use wrist splints at night, and use voltaren gel as needed.   Lollie Sailse Quervain Disease Lollie Sailse Quervain disease is inflammation of the tendon on the thumb side of the wrist. Tendons are cords of tissue that connect bones to muscles. The tendons in your hand pass through a tunnel, or sheath. A slippery layer of tissue (synovium) lets the tendons move smoothly in the sheath. With de Quervain disease, the sheath swells or thickens, causing friction and pain. The condition is also called de Quervain tendinosis and de Quervain syndrome. It occurs most often in women who are 9630-40 years old. What are the causes? The exact cause of de Quervain disease is not known. It may result from:  Overusing your hands, especially with repetitive motions that involve twisting your hand or using a forceful grip.  Pregnancy.  Rheumatoid disease.  What increases the risk? You may have a greater risk for de Quervain disease if you:  Are a middle-aged woman.  Are pregnant.  Have rheumatoid arthritis.  Have diabetes.  Use your hands far more than normal, especially with a tight grip or excessive twisting.  What are the signs or symptoms? Pain on the thumb side of your wrist is the main symptom of de Quervain disease. Other signs and symptoms include:  Pain that gets worse when you grasp something or turn your wrist.  Pain that extends up the forearm.  Cysts in the area of the pain.  Swelling of your wrist and hand.  A sensation of snapping in the wrist.  Trouble moving the thumb and wrist.  How is this diagnosed? Your health care provider may diagnose de Quervain disease based on your signs and symptoms. A physical exam will also be done. A simple test Lourena Simmonds(Finkelstein test) that involves pulling your thumb and wrist to see if this causes pain can help determine whether you have the condition. Sometimes you may need to have an X-ray. How is this treated? Avoiding any activity that  causes pain and swelling is the best treatment. Other options include:  Wearing a splint.  Taking medicine. Anti-inflammatory medicines and corticosteroid injections may reduce inflammation and relieve pain.  Having surgery if other treatments do not work.  Follow these instructions at home:  Using ice can be helpful after doing activities that involve the sore wrist. To apply ice to the injured area: ? Put ice in a plastic bag. ? Place a towel between your skin and the bag. ? Leave the ice on for 20 minutes, 2-3 times a day.  Take medicines only as directed by your health care provider.  Wear your splint as directed. This will allow your hand to rest and heal. Contact a health care provider if:  Your pain medicine does not help.  Your pain gets worse.  You develop new symptoms. This information is not intended to replace advice given to you by your health care provider. Make sure you discuss any questions you have with your health care provider. Document Released: 04/03/2001 Document Revised: 12/15/2015 Document Reviewed: 11/11/2013 Elsevier Interactive Patient Education  [employer]2018 [employer]vier Inc.

## 2017-02-28 NOTE — Progress Notes (Signed)
   [facility] Phone: [phone]   Date of Visit: 02/28/2017   HPI:  Wrist Pain:  - patient reports that she has bilateral writs/forearm pain since she gave birth to her son about 5-6 days ago - she reports her Left wrist hurts more than her right wrist. She is right handed.  - she points mainly to her forearm, not her wrists when she describes the pain.  - she reports her forearm area is slightly swollen - no redness, fevers, or chills - she thought it may be due to her IV sites from the hospital. She had her IV mainly on the right hand, but reports they did try it on her left hand.  - she has tried warm compresses and motrin which have not helped.  - no pain in other joints   ROS: See HPI.  PMFSH:  Allergic Rhinitis Anemia   PHYSICAL EXAM: Ht 5\' 7"  (1.702 m)   Wt 181 lb 12.8 oz (82.5 kg)   LMP 07/03/2016   Breastfeeding? Yes   BMI 28.47 kg/m  GEN: NAD MSK: Right wrist/forearm: no tenderness to palpation of her wrist or distal radius and ulna. Normal ROM of wrist. Pain is mainly with passive stretch of the tendons of the thumb. No significant swelling noted.  Left Wrist/forearm: no tenderness to palpation of her wrist or distal radius and ulna. Normal ROM of wrist. Pain is mainly with passive stretch of the tendons of the thumb. No significant swelling noted.  PSYCH: Mood and affect euthymic, normal rate and volume of speech NEURO: Awake, alert, no focal deficits grossly, normal speech  ASSESSMENT/PLAN:  De Quervain's tenosynovitis, bilateral: symptoms consistent with DrQuervains tenosynovitis. No signs of infection.  - ICE area  - recommended getting wrist splints to wear at least at night - Voltaren gel PRN  - follow up in 4 weeks if symptoms do not improve   Palma HolterKanishka G Carnell Beavers, MD PGY 3 [facility]

## 2017-02-28 NOTE — Telephone Encounter (Signed)
Mom has questions about pump tubing having milk and needing to clean.  LC encouraged mom to explore medela online for specific cleaning questions. LC advised mom can boil to sterilize if her tubing is not clean.  Mom aware to wait for tubing to be dry to run pump.

## 2017-03-07 ENCOUNTER — Telehealth: Payer: Self-pay | Admitting: *Deleted

## 2017-03-07 NOTE — Telephone Encounter (Signed)
Pt is having "cold symptoms" and wants to know what she can take since she is breastfeeding.  Mom and nephew are sick contacts.  Advised (per Dr. Deirdre Priesthambliss) to use Afrin, netti pot, and tylenol.  Discussed how the netti pot worked.  Also advised given her HTN in the hospital (as reported by patient) to check her BP while at rtie-aid. Fleeger, Maryjo RochesterJessica Dawn, CMA

## 2017-03-08 ENCOUNTER — Telehealth: Payer: Self-pay | Admitting: Obstetrics and Gynecology

## 2017-03-08 NOTE — Telephone Encounter (Signed)
Patient had questions about her prenatal pills, and the allergy medicine she was told to stop taking. She is requesting a phone call back today if possible.

## 2017-03-11 NOTE — Telephone Encounter (Signed)
I called Yesenia Weeks back and left a message I was returning her call- please call back if you still have questions.

## 2017-03-12 NOTE — Telephone Encounter (Signed)
Called patient and she states a few times she has taken a prenatal vitamin the baby has spit up a little and she was uncertain if that was related or not. Told patient likely not but she may try another PNV over the counter. Patient verbalized understanding and states they told her to stop taking her allergy medication but she feels like she needs something. Reviewed with patient that there isn't anything she can take on a daily basis due to possibility of making baby drowsy and decreasing her milk supply. Told patient occasional doses would be okay but to monitor baby closely and she couldn't take it every day. Also suggested she may try taking local honey herself to see if allergies improve. Patient verbalized understanding & had no questions

## 2017-03-20 ENCOUNTER — Encounter: Payer: Self-pay | Admitting: Advanced Practice Midwife

## 2017-03-20 ENCOUNTER — Ambulatory Visit: Payer: Self-pay | Admitting: Advanced Practice Midwife

## 2017-04-01 ENCOUNTER — Ambulatory Visit: Payer: Self-pay | Admitting: Medical

## 2017-04-15 ENCOUNTER — Ambulatory Visit: Payer: Self-pay | Admitting: Medical

## 2017-04-15 ENCOUNTER — Encounter: Payer: Self-pay | Admitting: General Practice

## 2017-04-15 NOTE — Progress Notes (Unsigned)
Patient no showed for appt today. Per Vonzella Nipple, can reschedule on her own accord.

## 2017-06-06 ENCOUNTER — Other Ambulatory Visit: Payer: Self-pay

## 2017-06-06 ENCOUNTER — Ambulatory Visit (INDEPENDENT_AMBULATORY_CARE_PROVIDER_SITE_OTHER): Payer: Medicaid Other | Admitting: Internal Medicine

## 2017-06-06 ENCOUNTER — Encounter: Payer: Self-pay | Admitting: Internal Medicine

## 2017-06-06 DIAGNOSIS — M654 Radial styloid tenosynovitis [de Quervain]: Secondary | ICD-10-CM | POA: Diagnosis present

## 2017-06-06 NOTE — Assessment & Plan Note (Signed)
No improvement with icing and wearing of OTC splints over the past three months. Discussed with patient that de Quervain's is typically self-limited but can take up to a year to resolve. Also discussed that there are other options that may provide symptomatic relief, although none have great data. Patient wishing to try anything to improve pain and help her take care of her young son.  - Prescription written for forearm-based thumb spica splints (patient could not say exactly what type of splint she had been wearing, so may not have been appropriate style) - Begin scheduled ibuprofen. Discussed with Dr. Raymondo BandKoval, and fine for her to take while breastfeeding.  - Schedule appt with sports med for steroid injection - Continue to ice PRN

## 2017-06-06 NOTE — Patient Instructions (Signed)
It was nice meeting you today  Yesenia Weeks!  Please begin taking ibuprofen one tablet every 6 hours. Try to take it on schedule to help control the inflammation that is causing your wrist pain.   [facility] for [facility]. Their phone number is [phone].  Finally, take your prescription for splints to a medical supply store. The nearest one is [facility] at [employer], however any medical supply store should have the splints you need.   If you have any questions or concerns, please feel free to call the clinic.   Be well,  Dr. Lunette StandsLancaster  De Quervain Tenosynovitis Lollie Sailse Quervain tenosynovitis is inflammation of the tendon on the thumb side of the wrist. Tendons are cords of tissue that connect bones to muscles. The tendons in your hand pass through a tunnel, or sheath. A slippery layer of tissue (synovium) lets the tendons move smoothly in the sheath. With de Quervain disease, the sheath swells or thickens, causing friction and pain. The condition is also called de Quervain tendinosis and de Quervain syndrome. It occurs most often in women who are 4030-40 years old. What are the causes? The exact cause of de Quervain disease is not known. It may result from:  Overusing your hands, especially with repetitive motions that involve twisting your hand or using a forceful grip.  Pregnancy.  Rheumatoid disease.  What increases the risk? You may have a greater risk for de Quervain disease if you:  Are a middle-aged woman.  Are pregnant.  Have rheumatoid arthritis.  Have diabetes.  Use your hands far more than normal, especially with a tight grip or excessive twisting.  What are the signs or symptoms? Pain on the thumb side of your wrist is the main symptom of de Quervain disease. Other signs and symptoms include:  Pain that gets worse when you grasp something  or turn your wrist.  Pain that extends up the forearm.  Cysts in the area of the pain.  Swelling of your wrist and hand.  A sensation of snapping in the wrist.  Trouble moving the thumb and wrist.  How is this diagnosed? Your health care provider may diagnose de Quervain disease based on your signs and symptoms. A physical exam will also be done. A simple test Lourena Simmonds(Finkelstein test) that involves pulling your thumb and wrist to see if this causes pain can help determine whether you have the condition. Sometimes you may need to have an X-ray. How is this treated? Avoiding any activity that causes pain and swelling is the best treatment. Other options include:  Wearing a splint.  Taking medicine. Anti-inflammatory medicines and corticosteroid injections may reduce inflammation and relieve pain.  Having surgery if other treatments do not work.  Follow these instructions at home:  Using ice can be helpful after doing activities that involve the sore wrist. To apply ice to the injured area: ? Put ice in a plastic bag. ? Place a towel between your skin and the bag. ? Leave the ice on for 20 minutes, 2-3 times a day.  Take medicines only as directed by your health care provider.  Wear your splint as directed. This will allow your hand to rest and heal. Contact a health care provider if:  Your pain medicine does not help.  Your pain gets worse.  You develop new symptoms. This information is not intended to replace advice given to  you by your health care provider. Make sure you discuss any questions you have with your health care provider. Document Released: 04/03/2001 Document Revised: 12/15/2015 Document Reviewed: 11/11/2013 Elsevier Interactive Patient Education  [employer]2018 Elsevier Inc.

## 2017-06-06 NOTE — Progress Notes (Signed)
   Subjective:   Patient: Yesenia Weeks       Birthdate: 1975/11/20       MRN: [id number]      HPI  Yesenia Weeks is a 41 y.o. female presenting for bilateral wrist pain.   Bilateral wrist pain Patient last seen on 08/09 for this issue. She was diagnosed with de Quervain's tenosynovitis and instructed to ice her wrists and wear splints at night. Patient says since then, the pain has worsened, despite frequenting icing and wearing splints. She has been wearing the splints as much as possible, typically only taking them off to avoid getting wet if doing dishes or taking a shower. Despite splints, she is still having difficulty holding her 633 mo old baby and completing normal daily activities. She has been taking Tylenol occasionally, but is concerned about taking it too frequently, as she is breastfeeding. Was prescribed Voltaren at last visit but was told by her pharmacist that she should not take this while breastfeeding. Pain is equal in both wrists. Pain is now beginning to shoot up her forearms some.   Smoking status reviewed. Patient is former smoker.   Review of Systems See HPI.     Objective:  Physical Exam  Constitutional: She is oriented to person, place, and time and well-developed, well-nourished, and in no distress.  HENT:  Head: Normocephalic and atraumatic.  Eyes: Conjunctivae and EOM are normal. Right eye exhibits no discharge. Left eye exhibits no discharge.  Pulmonary/Chest: Effort normal. No respiratory distress.  Musculoskeletal:  Mild TTP of wrists bilaterally. No swelling noted. 5/5 strength upper extremities bilaterally. Full grip strength bilaterally.   Neurological: She is alert and oriented to person, place, and time.  Psychiatric: Affect and judgment normal.      Assessment & Plan:  Tenosynovitis, de Quervain No improvement with icing and wearing of OTC splints over the past three months. Discussed with patient that de Quervain's is typically self-limited but can  take up to a year to resolve. Also discussed that there are other options that may provide symptomatic relief, although none have great data. Patient wishing to try anything to improve pain and help her take care of her young son.  - Prescription written for forearm-based thumb spica splints (patient could not say exactly what type of splint she had been wearing, so may not have been appropriate style) - Begin scheduled ibuprofen. Discussed with Dr. Raymondo BandKoval, and fine for her to take while breastfeeding.  - Schedule appt with sports med for steroid injection - Continue to ice PRN   Tarri AbernethyAbigail J Shyheim Tanney, MD, MPH PGY-3 [facility] Pager [phone]

## 2017-07-07 ENCOUNTER — Encounter (HOSPITAL_COMMUNITY): Payer: Self-pay

## 2017-07-07 ENCOUNTER — Emergency Department (HOSPITAL_COMMUNITY)
Admission: EM | Admit: 2017-07-07 | Discharge: 2017-07-07 | Disposition: A | Discharge status: Home / Self Care | Payer: Medicaid Other | Attending: Emergency Medicine | Admitting: Emergency Medicine

## 2017-07-07 ENCOUNTER — Emergency Department (HOSPITAL_COMMUNITY): Payer: Medicaid Other

## 2017-07-07 ENCOUNTER — Other Ambulatory Visit: Payer: Self-pay

## 2017-07-07 DIAGNOSIS — F909 Attention-deficit hyperactivity disorder, unspecified type: Secondary | ICD-10-CM | POA: Diagnosis not present

## 2017-07-07 DIAGNOSIS — M25562 Pain in left knee: Secondary | ICD-10-CM | POA: Diagnosis present

## 2017-07-07 DIAGNOSIS — Z87891 Personal history of nicotine dependence: Secondary | ICD-10-CM | POA: Insufficient documentation

## 2017-07-07 DIAGNOSIS — Z79899 Other long term (current) drug therapy: Secondary | ICD-10-CM | POA: Diagnosis not present

## 2017-07-07 MED ORDER — IBUPROFEN 600 MG PO TABS: 600.0000 mg | ORAL_TABLET | Freq: Three times a day (TID) | ORAL | 0 refills | Status: DC | PRN

## 2017-07-07 MED ORDER — HYDROCODONE-ACETAMINOPHEN 5-325 MG PO TABS: 1.0000 | ORAL_TABLET | ORAL | 0 refills | Status: DC | PRN

## 2017-07-07 MED ORDER — OXYCODONE-ACETAMINOPHEN 5-325 MG PO TABS
1.0000 | ORAL_TABLET | Freq: Once | ORAL | Status: AC
  Administered 2017-07-07: 1 via ORAL
  Filled 2017-07-07: qty 1

## 2017-07-07 MED ORDER — IBUPROFEN 400 MG PO TABS
600.0000 mg | ORAL_TABLET | Freq: Once | ORAL | Status: AC
  Administered 2017-07-07: 600 mg via ORAL
  Filled 2017-07-07: qty 1

## 2017-07-07 NOTE — ED Triage Notes (Signed)
Pt states that she was dancing and her L knee popped out of place, states that he cousin "popped it back in". Swelling to knee noted.

## 2017-07-07 NOTE — ED Notes (Signed)
Patient transported to X-ray 

## 2017-07-07 NOTE — Progress Notes (Signed)
Orthopedic Tech Progress Note Patient Details:  Delbra Slattery Jun 16, 1976 [id number]  Ortho Devices Type of Ortho Device: Crutches, Knee Immobilizer Ortho Device/Splint Location: lle Ortho Device/Splint Interventions: Ordered, Application, Adjustment   Post Interventions Patient Tolerated: Well Instructions Provided: Care of device, Adjustment of device   Trinna PostMartinez, Pearson Picou J 07/07/2017, 3:24 AM

## 2017-07-07 NOTE — ED Notes (Signed)
Called OTHO

## 2017-07-07 NOTE — ED Provider Notes (Signed)
[facility] Provider Note   CSN: [id number] Arrival date & time: 07/07/17  0137     History   Chief Complaint Chief Complaint  Patient presents with  . Knee Injury    HPI Yesenia Weeks is a 41 y.o. female.  HPI Patient is a 41 year old female who presents to the emergency department complaining of acute left knee pain that occurred while dancing tonight.  She states initially it seemed as though she had a patellar dislocation which was relocated by a friend.  She is never had a patellar dislocation before.  She reports ongoing pain in her knee at this time which is moderate in severity.  No prior knee injuries.   Past Medical History:  Diagnosis Date  . ADHD (attention deficit hyperactivity disorder)   . Allergy   . Anemia    iron def  . Anxiety   . BV (bacterial vaginosis)    recurrent-uses boric acid  . Depression   . Dysmenorrhea   . Substance abuse [facility](HCC)     Patient Active Problem List   Diagnosis Date Noted  . Tenosynovitis, de Quervain 06/06/2017  . Small for gestational age fetus affecting management of mother, third trimester, fetus 1 01/30/2017  . Supervision of high risk pregnancy, antepartum 10/18/2016  . AMA (advanced maternal age) multigravida 35+ 09/21/2016  . GBS bacteriuria 09/06/2016  . Anxiety state 08/31/2016  . Family history of breast cancer 03/31/2014  . Genital herpes 10/26/2013  . ACNE, MILD 09/10/2008  . ASTEATOTIC ECZEMA 09/10/2008  . HALLUX VALGUS, ACQUIRED 11/06/2006  . ANEMIA, IRON DEFICIENCY, UNSPEC. 09/19/2006  . Mood disorder (HCC) 09/19/2006  . RHINITIS, ALLERGIC 09/19/2006    Past Surgical History:  Procedure Laterality Date  . HERNIA REPAIR     x2    OB History    Gravida Para Term Preterm AB Living   4 4 3 1  0 3   SAB TAB Ectopic Multiple Live Births   0     0 3       Home Medications    Prior to Admission medications   Medication Sig Start Date End Date Taking?  Authorizing Provider  diclofenac sodium (VOLTAREN) 1 % GEL Apply 2 g topically 3 (three) times daily as needed. 02/28/17   Palma HolterGunadasa, Kanishka G, MD  HYDROcodone-acetaminophen (NORCO/VICODIN) 5-325 MG tablet Take 1 tablet by mouth every 4 (four) hours as needed for moderate pain. 07/07/17   Azalia Bilisampos, Itzayana Pardy, MD  ibuprofen (ADVIL,MOTRIN) 600 MG tablet Take 1 tablet (600 mg total) by mouth every 8 (eight) hours as needed. 07/07/17   Azalia Bilisampos, Tulio Facundo, MD  Prenatal Vit-Fe Fumarate-FA (PRENATAL MULTIVITAMIN) TABS tablet Take 1 tablet by mouth daily at 12 noon.    [provider]    Family History Family History  Problem Relation Age of Onset  . Cancer Maternal Aunt        breast  . Diabetes Maternal Grandmother   . Hypertension Maternal Grandmother   . Mental illness Maternal Grandmother   . Cancer Maternal Grandmother   . Diabetes Paternal Grandmother   . Kidney disease Paternal Grandmother   . Mental illness Paternal Grandmother   . Hypertension Paternal Grandmother     Social History Social History   Tobacco Use  . Smoking status: Former Smoker    Types: Cigarettes    Last attempt to quit: 08/13/2016    Years since quitting: 0.8  . Smokeless tobacco: Never Used  Substance Use Topics  . Alcohol use:  Yes    Comment: 40 onces of beer a night/stopped after +UPT  . Drug use: Yes    Types: Cocaine    Comment: prior to positive pregnancy test     Allergies   Patient has no known allergies.   Review of Systems Review of Systems  All other systems reviewed and are negative.    Physical Exam Updated Vital Signs BP 112/68   Pulse 66   Temp 98.2 F (36.8 C) (Oral)   Resp 15   SpO2 100%   Physical Exam  Constitutional: She is oriented to person, place, and time. She appears well-developed and well-nourished.  HENT:  Head: Normocephalic.  Eyes: EOM are normal.  Neck: Normal range of motion.  Pulmonary/Chest: Effort normal.  Abdominal: She exhibits no distension.    Musculoskeletal: Normal range of motion.  Left knee effusion.  Normal pulses in left foot.  Left lower extremity with soft compartments.  No obvious laxity of the left knee.  Patella of the left knee appears located.  Able to extend at the left knee  Neurological: She is alert and oriented to person, place, and time.  Psychiatric: She has a normal mood and affect.  Nursing note and vitals reviewed.    ED Treatments / Results  Labs (all labs ordered are listed, but only abnormal results are displayed) Labs Reviewed - No data to display  EKG  EKG Interpretation None       Radiology Dg Knee Complete 4 Views Left  Result Date: 07/07/2017 CLINICAL DATA:  Initial evaluation for acute pain and swelling status post injury. EXAM: LEFT KNEE - COMPLETE 4+ VIEW COMPARISON:  None. FINDINGS: No acute fracture or dislocation. Moderate to large joint effusion present within the suprapatellar recess. Minimal degenerative osteoarthritic changes noted. Osseous mineralization normal. No other soft tissue abnormality. IMPRESSION: 1. No acute fracture or dislocation. 2. Moderate to large joint effusion within the suprapatellar recess. Electronically Signed   By: Rise MuBenjamin  McClintock M.D.   On: 07/07/2017 02:32    Procedures Procedures (including critical care time)  Medications Ordered in ED Medications  ibuprofen (ADVIL,MOTRIN) tablet 600 mg (600 mg Oral Given 07/07/17 0253)  oxyCODONE-acetaminophen (PERCOCET/ROXICET) 5-325 MG per tablet 1 tablet (1 tablet Oral Given 07/07/17 0253)     Initial Impression / Assessment and Plan / ED Course  I have reviewed the triage vital signs and the nursing notes.  Pertinent labs & imaging results that were available during my care of the patient were reviewed by me and considered in my medical decision making (see chart for details).     Suspect left patellar dislocation with relocation at scene.  Patient with joint effusion at this time.  Could represent  internal derangement.  Crutches and knee immobilizer.  Orthopedic follow-up.  Final Clinical Impressions(s) / ED Diagnoses   Final diagnoses:  Acute pain of left knee    ED Discharge Orders        Ordered    ibuprofen (ADVIL,MOTRIN) 600 MG tablet  Every 8 hours PRN     07/07/17 0327    HYDROcodone-acetaminophen (NORCO/VICODIN) 5-325 MG tablet  Every 4 hours PRN     07/07/17 0327       Azalia Bilisampos, Brendaly Townsel, MD 07/07/17 0330

## 2017-07-10 ENCOUNTER — Ambulatory Visit: Payer: Medicaid Other | Admitting: Internal Medicine

## 2017-07-22 ENCOUNTER — Encounter (INDEPENDENT_AMBULATORY_CARE_PROVIDER_SITE_OTHER): Payer: Self-pay | Admitting: Physician Assistant

## 2017-07-22 ENCOUNTER — Ambulatory Visit (INDEPENDENT_AMBULATORY_CARE_PROVIDER_SITE_OTHER): Payer: Medicaid Other | Admitting: Physician Assistant

## 2017-07-22 DIAGNOSIS — S83005A Unspecified dislocation of left patella, initial encounter: Secondary | ICD-10-CM

## 2017-07-22 MED ORDER — IBUPROFEN 600 MG PO TABS: 600.0000 mg | ORAL_TABLET | Freq: Three times a day (TID) | ORAL | 0 refills | Status: DC | PRN

## 2017-07-22 NOTE — Progress Notes (Signed)
Office Visit Note   Patient: Yesenia Weeks           Date of Birth: [birth year]           MRN: [id number] Visit Date: 07/22/2017              Requested by: Latrelle DodrillMcIntyre, Brittany J, MD [address] [place], [place] [address] PCP: Latrelle DodrillMcIntyre, Brittany J, MD   Assessment & Plan: Visit Diagnoses:  1. Patellar dislocation, left, initial encounter     Plan: She will work on Dance movement psychotherapistquad strengthening.  She is given a hinged knee open patella brace which she will wear whenever she is up ambulating.  Refill on her ibuprofen is given.  Ice relative rest encouraged.  We will see her back in 4 weeks check her progress lack of.  She is weightbearing as tolerated.  Follow-Up Instructions: Return in about 4 weeks (around 08/19/2017).   Orders:  No orders of the defined types were placed in this encounter.  Meds ordered this encounter  Medications  . ibuprofen (ADVIL,MOTRIN) 600 MG tablet    Sig: Take 1 tablet (600 mg total) by mouth every 8 (eight) hours as needed.    Dispense:  40 tablet    Refill:  0      Procedures: No procedures performed   Clinical Data: No additional findings.   Subjective: Left knee pain  HPI Yesenia Weeks is a 41 year old female who is now 4 months postpartum.  She reports on December 15 she was dancing and that her kneecap "came out "laterally.  She was able to reduce it with but did go to the ER where radiographs were obtained and she was placed in a knee immobilizer.  She still has pain in the knee when walking for prolonged period of time she is still wearing new left knee immobilizer.  She is afraid that the kneecap may dislocate again.  She is never had a patella dislocation prior to this episode she been taking ibuprofen for pain.  She is still.  Breast-feeding.  Radiographs left knee were reviewed and showed no acute fracture no dislocation. Review of Systems See HPI otherwise negative  Objective: Vital Signs: There were no vitals taken for this  visit.  Physical Exam  Constitutional: She is oriented to person, place, and time. She appears well-developed and well-nourished. No distress.  Pulmonary/Chest: Effort normal.  Neurological: She is alert and oriented to person, place, and time.  Skin: She is not diaphoretic.  Psychiatric: She has a normal mood and affect.    Ortho Exam Left knee able to do straight leg raise.  Tenderness peripatellar region.  No instability valgus varus stressing.  Apprehension test positive.  Full extension of the left knee flexion to approximately 90 degrees.  Left calf supple nontender.  No rashes skin lesions ulcerations or ecchymosis left knee.  Slight edema left knee compared to the right.  Nontender along medial lateral joint line.  Slight left knee effusion. Patella tracks well with passive range of motion.  Specialty Comments:  No specialty comments available.  Imaging: No results found.   PMFS History: Patient Active Problem List   Diagnosis Date Noted  . Tenosynovitis, de Quervain 06/06/2017  . Small for gestational age fetus affecting management of mother, third trimester, fetus 1 01/30/2017  . Supervision of high risk pregnancy, antepartum 10/18/2016  . AMA (advanced maternal age) multigravida 35+ 09/21/2016  . GBS bacteriuria 09/06/2016  . Anxiety state 08/31/2016  . Family history of breast cancer 03/31/2014  .  Genital herpes 10/26/2013  . ACNE, MILD 09/10/2008  . ASTEATOTIC ECZEMA 09/10/2008  . HALLUX VALGUS, ACQUIRED 11/06/2006  . ANEMIA, IRON DEFICIENCY, UNSPEC. 09/19/2006  . Mood disorder (HCC) 09/19/2006  . RHINITIS, ALLERGIC 09/19/2006   Past Medical History:  Diagnosis Date  . ADHD (attention deficit hyperactivity disorder)   . Allergy   . Anemia    iron def  . Anxiety   . BV (bacterial vaginosis)    recurrent-uses boric acid  . Depression   . Dysmenorrhea   . Substance abuse (HCC)     Family History  Problem Relation Age of Onset  . Cancer Maternal Aunt         breast  . Diabetes Maternal Grandmother   . Hypertension Maternal Grandmother   . Mental illness Maternal Grandmother   . Cancer Maternal Grandmother   . Diabetes Paternal Grandmother   . Kidney disease Paternal Grandmother   . Mental illness Paternal Grandmother   . Hypertension Paternal Grandmother     Past Surgical History:  Procedure Laterality Date  . HERNIA REPAIR     x2   Social History   Occupational History  . Occupation: [employer]  Tobacco Use  . Smoking status: Former Smoker    Types: Cigarettes    Last attempt to quit: 08/13/2016    Years since quitting: 0.9  . Smokeless tobacco: Never Used  Substance and Sexual Activity  . Alcohol use: Yes    Comment: 40 onces of beer a night/stopped after +UPT  . Drug use: Yes    Types: Cocaine    Comment: prior to positive pregnancy test  . Sexual activity: Not Currently    Birth control/protection: None

## 2017-08-16 ENCOUNTER — Telehealth: Payer: Self-pay | Admitting: Advanced Practice Midwife

## 2017-08-16 NOTE — Telephone Encounter (Signed)
Pt called [facility] NP office reporting she was returning a call from ~30 minutes ago.  Pulled up pt record and no recent phone call records. No recent labs.  [facility] but is not expecting a phone call from us. She denies any OB/Gyn concerns. Pt reports number that shows up on her phone is [phone] and she pressed 1 and was directed to [facility] number.  Since I see no evidence that [facility] the pt, I recommended she call the number again and select a different choice from the touchtone menu.  Pt states understanding.

## 2017-09-06 ENCOUNTER — Encounter: Payer: Self-pay | Admitting: Family Medicine

## 2017-09-06 ENCOUNTER — Other Ambulatory Visit: Payer: Self-pay

## 2017-09-06 ENCOUNTER — Ambulatory Visit (INDEPENDENT_AMBULATORY_CARE_PROVIDER_SITE_OTHER): Payer: Medicaid Other | Admitting: Family Medicine

## 2017-09-06 DIAGNOSIS — F39 Unspecified mood [affective] disorder: Secondary | ICD-10-CM | POA: Diagnosis present
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
## 2017-09-06 NOTE — Patient Instructions (Addendum)
Call the Behavioral Medicine center to schedule an appointment. Their phone number is: [phone].   If you have any thoughts of hurting yourself or anyone else, go to the Emergency Room to stay safe.  Call with any questions or concerns.  Be well, Dr. Pollie MeyerMcIntyre

## 2017-09-09 NOTE — Assessment & Plan Note (Addendum)
Diagnosis unclear.  May have a bipolar spectrum illness.  She is also breast-feeding her 5450-month-old, which complicates our options for medication management.  I think she would be best served by being evaluated by a psychiatrist.  I gave her the [facility] so she can schedule an appointment.  She is not suicidal or having any thoughts of harming anyone else including her baby.  Reviewed return precautions, including reasons to go to the ED if she has thoughts of harming herself or others.  She will continue to see her counselor, and is confident she can make her appointment there next week.

## 2017-10-09 ENCOUNTER — Telehealth: Payer: Self-pay

## 2017-10-09 DIAGNOSIS — M654 Radial styloid tenosynovitis [de Quervain]: Secondary | ICD-10-CM

## 2017-10-09 NOTE — Telephone Encounter (Signed)
Was referred to sports medicine a few months back but never went, has some questions about the referral and getting appointment. Will forward to Ananias PilgrimJackie Meredith B Thomsen, RN

## 2017-10-11 NOTE — Telephone Encounter (Signed)
Please put in a referral for the patent to [facility]. She would also like a copy of the prescriptions that she received a few months ago for wrist braces. jw

## 2017-10-18 NOTE — Telephone Encounter (Signed)
Referral entered. I will write rx for forearm based thumb spica splints & place at front desk for patient to pick up. Please inform patient.  Latrelle DodrillBrittany J Donnell Beauchamp, MD

## 2017-10-18 NOTE — Telephone Encounter (Signed)
Attempted to call and inform, but female who answered the phone states that "she ran to the store, and will be back in 5 minutes".  Advised that my call was not urgent and that someone would give her a call on Monday. Fleeger, Maryjo RochesterJessica Dawn, CMA

## 2017-10-22 ENCOUNTER — Encounter: Payer: Self-pay | Admitting: Family Medicine

## 2017-10-22 ENCOUNTER — Ambulatory Visit (INDEPENDENT_AMBULATORY_CARE_PROVIDER_SITE_OTHER): Payer: Self-pay | Admitting: Family Medicine

## 2017-10-22 VITALS — BP 100/60 | HR 80 | Temp 98.0°F | Ht 67.0 in | Wt 156.8 lb

## 2017-10-22 DIAGNOSIS — Z1239 Encounter for other screening for malignant neoplasm of breast: Secondary | ICD-10-CM

## 2017-10-22 DIAGNOSIS — Z1231 Encounter for screening mammogram for malignant neoplasm of breast: Secondary | ICD-10-CM

## 2017-10-22 DIAGNOSIS — M25552 Pain in left hip: Secondary | ICD-10-CM

## 2017-10-22 DIAGNOSIS — Z0000 Encounter for general adult medical examination without abnormal findings: Secondary | ICD-10-CM

## 2017-10-22 DIAGNOSIS — Z309 Encounter for contraceptive management, unspecified: Secondary | ICD-10-CM

## 2017-10-22 DIAGNOSIS — Z3202 Encounter for pregnancy test, result negative: Secondary | ICD-10-CM

## 2017-10-22 LAB — POCT UA - MICROSCOPIC ONLY

## 2017-10-22 LAB — POCT URINALYSIS DIP (MANUAL ENTRY)
Bilirubin, UA: NEGATIVE
Glucose, UA: NEGATIVE mg/dL
Ketones, POC UA: NEGATIVE mg/dL
Leukocytes, UA: NEGATIVE
Nitrite, UA: NEGATIVE
Protein Ur, POC: NEGATIVE mg/dL
Spec Grav, UA: 1.02 (ref 1.010–1.025)
Urobilinogen, UA: 0.2 E.U./dL
pH, UA: 6 (ref 5.0–8.0)

## 2017-10-22 LAB — POCT URINE PREGNANCY: Preg Test, Ur: NEGATIVE

## 2017-10-22 MED ORDER — NORETHINDRONE 0.35 MG PO TABS: 1.0000 | ORAL_TABLET | Freq: Every day | ORAL | 5 refills | Status: DC

## 2017-10-22 NOTE — Patient Instructions (Addendum)
Getting xray of hip Try heating pad, tylenol, ibuprofen as needed  See the handout on how to schedule your mammogram. This is an important test to screen for breast cancer.  Urine looks fine  See medicaid dentist list  Sent in birth control pills for you.  Be well, Dr. Ardelia Mems   Health Maintenance, Female Adopting a healthy lifestyle and getting preventive care can go a long way to promote health and wellness. Talk with your health care provider about what schedule of regular examinations is right for you. This is a good chance for you to check in with your provider about disease prevention and staying healthy. In between checkups, there are plenty of things you can do on your own. Experts have done a lot of research about which lifestyle changes and preventive measures are most likely to keep you healthy. Ask your health care provider for more information. Weight and diet Eat a healthy diet  Be sure to include plenty of vegetables, fruits, low-fat dairy products, and lean protein.  Do not eat a lot of foods high in solid fats, added sugars, or salt.  Get regular exercise. This is one of the most important things you can do for your health. ? Most adults should exercise for at least 150 minutes each week. The exercise should increase your heart rate and make you sweat (moderate-intensity exercise). ? Most adults should also do strengthening exercises at least twice a week. This is in addition to the moderate-intensity exercise.  Maintain a healthy weight  Body mass index (BMI) is a measurement that can be used to identify possible weight problems. It estimates body fat based on height and weight. Your health care provider can help determine your BMI and help you achieve or maintain a healthy weight.  For females 77 years of age and older: ? A BMI below 18.5 is considered underweight. ? A BMI of 18.5 to 24.9 is normal. ? A BMI of 25 to 29.9 is considered overweight. ? A BMI of 30  and above is considered obese.  Watch levels of cholesterol and blood lipids  You should start having your blood tested for lipids and cholesterol at 41 years of age, then have this test every 5 years.  You may need to have your cholesterol levels checked more often if: ? Your lipid or cholesterol levels are high. ? You are older than 41 years of age. ? You are at high risk for heart disease.  Cancer screening Lung Cancer  Lung cancer screening is recommended for adults 74-46 years old who are at high risk for lung cancer because of a history of smoking.  A yearly low-dose CT scan of the lungs is recommended for people who: ? Currently smoke. ? Have quit within the past 15 years. ? Have at least a 30-pack-year history of smoking. A pack year is smoking an average of one pack of cigarettes a day for 1 year.  Yearly screening should continue until it has been 15 years since you quit.  Yearly screening should stop if you develop a health problem that would prevent you from having lung cancer treatment.  Breast Cancer  Practice breast self-awareness. This means understanding how your breasts normally appear and feel.  It also means doing regular breast self-exams. Let your health care provider know about any changes, no matter how small.  If you are in your 20s or 30s, you should have a clinical breast exam (CBE) by a health care provider every 1-3  years as part of a regular health exam.  If you are 27 or older, have a CBE every year. Also consider having a breast X-ray (mammogram) every year.  If you have a family history of breast cancer, talk to your health care provider about genetic screening.  If you are at high risk for breast cancer, talk to your health care provider about having an MRI and a mammogram every year.  Breast cancer gene (BRCA) assessment is recommended for women who have family members with BRCA-related cancers. BRCA-related cancers  include: ? Breast. ? Ovarian. ? Tubal. ? Peritoneal cancers.  Results of the assessment will determine the need for genetic counseling and BRCA1 and BRCA2 testing.  Cervical Cancer Your health care provider may recommend that you be screened regularly for cancer of the pelvic organs (ovaries, uterus, and vagina). This screening involves a pelvic examination, including checking for microscopic changes to the surface of your cervix (Pap test). You may be encouraged to have this screening done every 3 years, beginning at age 57.  For women ages 42-65, health care providers may recommend pelvic exams and Pap testing every 3 years, or they may recommend the Pap and pelvic exam, combined with testing for human papilloma virus (HPV), every 5 years. Some types of HPV increase your risk of cervical cancer. Testing for HPV may also be done on women of any age with unclear Pap test results.  Other health care providers may not recommend any screening for nonpregnant women who are considered low risk for pelvic cancer and who do not have symptoms. Ask your health care provider if a screening pelvic exam is right for you.  If you have had past treatment for cervical cancer or a condition that could lead to cancer, you need Pap tests and screening for cancer for at least 20 years after your treatment. If Pap tests have been discontinued, your risk factors (such as having a new sexual partner) need to be reassessed to determine if screening should resume. Some women have medical problems that increase the chance of getting cervical cancer. In these cases, your health care provider may recommend more frequent screening and Pap tests.  Colorectal Cancer  This type of cancer can be detected and often prevented.  Routine colorectal cancer screening usually begins at 41 years of age and continues through 41 years of age.  Your health care provider may recommend screening at an earlier age if you have risk factors  for colon cancer.  Your health care provider may also recommend using home test kits to check for hidden blood in the stool.  A small camera at the end of a tube can be used to examine your colon directly (sigmoidoscopy or colonoscopy). This is done to check for the earliest forms of colorectal cancer.  Routine screening usually begins at age 81.  Direct examination of the colon should be repeated every 5-10 years through 41 years of age. However, you may need to be screened more often if early forms of precancerous polyps or small growths are found.  Skin Cancer  Check your skin from head to toe regularly.  Tell your health care provider about any new moles or changes in moles, especially if there is a change in a mole's shape or color.  Also tell your health care provider if you have a mole that is larger than the size of a pencil eraser.  Always use sunscreen. Apply sunscreen liberally and repeatedly throughout the day.  Protect yourself  by wearing long sleeves, pants, a wide-brimmed hat, and sunglasses whenever you are outside.  Heart disease, diabetes, and high blood pressure  High blood pressure causes heart disease and increases the risk of stroke. High blood pressure is more likely to develop in: ? People who have blood pressure in the high end of the normal range (130-139/85-89 mm Hg). ? People who are overweight or obese. ? People who are African American.  If you are 18-39 years of age, have your blood pressure checked every 3-5 years. If you are 40 years of age or older, have your blood pressure checked every year. You should have your blood pressure measured twice-once when you are at a hospital or clinic, and once when you are not at a hospital or clinic. Record the average of the two measurements. To check your blood pressure when you are not at a hospital or clinic, you can use: ? An automated blood pressure machine at a pharmacy. ? A home blood pressure monitor.  If  you are between 55 years and 79 years old, ask your health care provider if you should take aspirin to prevent strokes.  Have regular diabetes screenings. This involves taking a blood sample to check your fasting blood sugar level. ? If you are at a normal weight and have a low risk for diabetes, have this test once every three years after 41 years of age. ? If you are overweight and have a high risk for diabetes, consider being tested at a younger age or more often. Preventing infection Hepatitis B  If you have a higher risk for hepatitis B, you should be screened for this virus. You are considered at high risk for hepatitis B if: ? You were born in a country where hepatitis B is common. Ask your health care provider which countries are considered high risk. ? Your parents were born in a high-risk country, and you have not been immunized against hepatitis B (hepatitis B vaccine). ? You have HIV or AIDS. ? You use needles to inject street drugs. ? You live with someone who has hepatitis B. ? You have had sex with someone who has hepatitis B. ? You get hemodialysis treatment. ? You take certain medicines for conditions, including cancer, organ transplantation, and autoimmune conditions.  Hepatitis C  Blood testing is recommended for: ? Everyone born from 1945 through 1965. ? Anyone with known risk factors for hepatitis C.  Sexually transmitted infections (STIs)  You should be screened for sexually transmitted infections (STIs) including gonorrhea and chlamydia if: ? You are sexually active and are younger than 41 years of age. ? You are older than 41 years of age and your health care provider tells you that you are at risk for this type of infection. ? Your sexual activity has changed since you were last screened and you are at an increased risk for chlamydia or gonorrhea. Ask your health care provider if you are at risk.  If you do not have HIV, but are at risk, it may be recommended  that you take a prescription medicine daily to prevent HIV infection. This is called pre-exposure prophylaxis (PrEP). You are considered at risk if: ? You are sexually active and do not regularly use condoms or know the HIV status of your partner(s). ? You take drugs by injection. ? You are sexually active with a partner who has HIV.  Talk with your health care provider about whether you are at high risk of being infected   with HIV. If you choose to begin PrEP, you should first be tested for HIV. You should then be tested every 3 months for as long as you are taking PrEP. Pregnancy  If you are premenopausal and you may become pregnant, ask your health care provider about preconception counseling.  If you may become pregnant, take 400 to 800 micrograms (mcg) of folic acid every day.  If you want to prevent pregnancy, talk to your health care provider about birth control (contraception). Osteoporosis and menopause  Osteoporosis is a disease in which the bones lose minerals and strength with aging. This can result in serious bone fractures. Your risk for osteoporosis can be identified using a bone density scan.  If you are 65 years of age or older, or if you are at risk for osteoporosis and fractures, ask your health care provider if you should be screened.  Ask your health care provider whether you should take a calcium or vitamin D supplement to lower your risk for osteoporosis.  Menopause may have certain physical symptoms and risks.  Hormone replacement therapy may reduce some of these symptoms and risks. Talk to your health care provider about whether hormone replacement therapy is right for you. Follow these instructions at home:  Schedule regular health, dental, and eye exams.  Stay current with your immunizations.  Do not use any tobacco products including cigarettes, chewing tobacco, or electronic cigarettes.  If you are pregnant, do not drink alcohol.  If you are  breastfeeding, limit how much and how often you drink alcohol.  Limit alcohol intake to no more than 1 drink per day for nonpregnant women. One drink equals 12 ounces of beer, 5 ounces of wine, or 1 ounces of hard liquor.  Do not use street drugs.  Do not share needles.  Ask your health care provider for help if you need support or information about quitting drugs.  Tell your health care provider if you often feel depressed.  Tell your health care provider if you have ever been abused or do not feel safe at home. This information is not intended to replace advice given to you by your health care provider. Make sure you discuss any questions you have with your health care provider. Document Released: 01/22/2011 Document Revised: 12/15/2015 Document Reviewed: 04/12/2015 Elsevier Interactive Patient Education  2018 [employer]  

## 2017-10-22 NOTE — Progress Notes (Signed)
Date of Visit: 10/22/2017   HPI:  Patient presents today for a well woman exam.   Concerns today: Hip pain, urine smell, see below Contraception: Currently just condoms, but willing to try progesterone only pills as she is breast-feeding.  Not interested in IUD, Nexplanon, Depo-Provera. Pelvic symptoms: Denies any vaginal discharge or pelvic pain. Sexual activity: Sexually active STD Screening: Declines concerns for STD and does not want testing today. Pap smear status: Current Smoking: No Alcohol: No, not presently.  Has had issues with alcohol abuse in the past Drugs: No Mood: Mood is doing somewhat better since I last saw her.  Has not been seeing her counselor regularly.  Denies thoughts of harming herself or others.  Still working on trying to get in with psychiatry. Dentist: Would like handout on dentists that take Medicaid.  Pain: Has had pain for about a month in her left hip the area of the anterior superior iliac spine.  Also has some discomfort in her lower back.  Urine odor: Recently noticed that her urine was malodorous, but thinks she was not drinking enough water.  No fevers.  She wants to make sure she does not have a urinary tract infection.  No other urinary symptoms.  Asthma/chest tightness: noticed the other day she had some chest tightness, felt like when she was a child and had asthma. Chest was a little sore to palpation then.   ROS: See HPI  PMFSH:  Cancers in family: grandmother, 2 aunts, first cousins have had breast cancer  PHYSICAL EXAM: BP 100/60   Pulse 80   Temp 98 F (36.7 C)   Ht 5\' 7"  (1.702 m)   Wt 156 lb 12.8 oz (71.1 kg)   SpO2 99%   BMI 24.56 kg/m  Gen: NAD, pleasant, cooperative HEENT: NCAT, PERRL, no palpable thyromegaly or anterior cervical lymphadenopathy Heart: RRR, no murmurs Lungs: CTAB, NWOB Abdomen: soft, nontender to palpation Neuro: grossly nonfocal, speech normal  musculoskeletal : left hip with mild tenderness at site of  ASIS. Mild paraspinal muscle tenderness over lumbar spine. Gait normal.  ASSESSMENT/PLAN:  Health maintenance:  -STD screening: Declined today -pap smear: Up-to-date -mammogram: Order entered as patient has strong family history of breast cancer, given handout on how to schedule mammogram.  She may wait until her Medicaid is reactivated. -immunizations: Up-to-date -handout given on health maintenance topics  Left hip pain: Over the area of the ASIS.  Check x-ray to rule out bony lesion.  Urine odor: Urinalysis done today and is largely unremarkable.  Asthma: brought up by patient at end of visit, with her endorsing an episode of chest tightness the other day that felt like her childhood asthma. Offered to write albuterol rx but she is currently uninsured, waiting on her medicaid to be reactivated. She has an albuterol inhaler at home that is prescribed for her daughter. Advised I do not generally recommend using another person's medications. Once her medicaid is set back up will plan to prescribe albuterol for her.  Contraception management Currently breast-feeding so we are limited to progesterone only options.  The only when she is agreeable to trying is progesterone only pill.  Urine pregnancy test confirmed negative today.  Advised on importance of taking this medication at the exact same time every single day for birth control.  Prescription sent in.   FOLLOW UP: Follow up with me after medicaid is set back up to get screening labs (CMET, lipids) AND follow up on other issues above  Yesenia J.  Pollie Meyer, MD [facility]

## 2017-10-23 ENCOUNTER — Encounter: Payer: Self-pay | Admitting: Family Medicine

## 2017-10-23 ENCOUNTER — Ambulatory Visit (INDEPENDENT_AMBULATORY_CARE_PROVIDER_SITE_OTHER): Payer: Self-pay | Admitting: Family Medicine

## 2017-10-23 VITALS — BP 110/78 | Ht 67.0 in | Wt 156.0 lb

## 2017-10-23 DIAGNOSIS — M654 Radial styloid tenosynovitis [de Quervain]: Secondary | ICD-10-CM

## 2017-10-23 MED ORDER — METHYLPREDNISOLONE ACETATE 40 MG/ML IJ SUSP
40.0000 mg | Freq: Once | INTRAMUSCULAR | Status: AC
  Administered 2017-10-23: 40 mg via INTRA_ARTICULAR

## 2017-10-23 NOTE — Progress Notes (Signed)
Chief complaint: Right wrist pain x 12-15 months  History of present illness: Yesenia Weeks is a 42 year old right-hand-dominant female who presents to sports medicine office today with chief complaint of right wrist and hand pain.  She points to the first dorsal compartment as to where she feels most of the pain.  She is 8 months postpartum and she reports during pregnancy symptoms worsened since that time.  She reports pain is a sharp, shooting, and occasionally throbbing pain.  She reports any type of wrist flexion as well as radial deviation are aggravating factors.  She reports that he does have to pick up her son very frequently and this causes increased pain.  She reports no previous injury or trauma to the right hand or wrist. She does report on the same hand having carpal tunnel syndrome in the past.  She reports that she has tried a wrist splint but it was not comfortable for her.  She reports that she has not used any type of medications for pain.  She reports over the last 8 months symptoms have been progressively worsening.  She reports having slight swelling in her right hand today.  No numbness, tingling, burning paresthesias.  Rest otherwise is only alleviating factor. She does not report of any fevers, chills, or night sweats.  Review of systems:  As stated above  Her past medical history, surgical history, family history, and social history obtained and reviewed.  Her past medical history is notable for ADHD, anemia, anxiety, and depression; surgical history is notable for hernia repair; she does not report of any current tobacco use; family history is notable for cancer, hypertension, type 2 diabetes, and kidney disease; allergies and medications are reviewed and are reflected in EMR.  Physical exam: Vital signs are reviewed and are documented in the chart Gen.: Alert, oriented, appears stated age, in no apparent distress HEENT: Moist oral mucosa Respiratory: Normal respirations, able to  speak in full sentences Cardiac: Regular rate, distal pulses 2+ Integumentary: No rashes on visible skin:  Neurologic: Strength 5/5, sensation 2+ bilateral wrist, hands, and fingers Psych: Normal affect, mood is described as good Musculoskeletal: Inspection of right wrist reveals no obvious deformity or muscle atrophy, no warmth, erythema, ecchymosis noted, slight puffiness noted over the first dorsal compartment, she is tender palpation over the first dorsal compartment, pain worsened by wrist flexion and ulnar deviation, she does have full wrist range of motion, Finkelstein positive, grind test negative, no tenderness over the MCP or IP joint of the thumb, intrinsic hand muscle wasting, Tinel over carpal tunnel and Guyon canal negative  Procedure:  After written informed consent signed and obtained, and benefit of pain relief and risk of bleeding, infection, and steroid flare discussed, Lailany agreed to proceed with cortisone injection into the right first dorsal compartment . After timeout, area  was cleaned with alcohol wipes. Sterile ultrasound probe was applied and 1st dorsal compartment was identified under ultrasound. Using 1 cc 1% lidocaine without epinephrine and 1 cc of 40 mg Depo-Medrol on a 25-gauge 1.5 inch needle, the right 1st dorsal compartment was injected. Ultrasound confirmed accurate placement of medication and picture was saved. She did have immediate relief of symptoms. She did not have any bleeding afterwards. No complications noted from procedure.   Assessment and plan: 1. Right wrist pain, consistent with De Quervain's tenosynovitis 2. 8 months postpartum  Plan: Discussed with Brieonna today that her symptoms are consistent with de Quervain's tenosynovitis.  She seems to be pretty miserable with  this today and did offer her a cortisone injection into the right first dorsal compartment.  She reports that she is breast-feeding and wants to make sure that this will not get into  her breast milk.  Discussed that there is a very small amount that will get into the breast milk but will not cause any adverse effects for the baby.  She is agreeable to proceed with injection.  Injection done under ultrasound guidance as noted above without any complications noted.  She was fitted with a wrist splint in the office today.  Discussed to use this for the entire day over the next 3-5 days.  She is not working right now, but discussed to have one of her daughters or her fianc at home to help out with baby duty and picking up baby over the next few days as the cortisone can soften up the ligaments and surrounding tissue.  Discussed heat and ice as well as home exercise program.  Will have her follow-up in 4 weeks to see how she is doing.  She will otherwise follow-up sooner as needed.   Haynes Kerns, M.D. Primary Care Sports Medicine Fellow [facility]

## 2017-10-25 NOTE — Assessment & Plan Note (Signed)
Currently breast-feeding so we are limited to progesterone only options.  The only when she is agreeable to trying is progesterone only pill.  Urine pregnancy test confirmed negative today.  Advised on importance of taking this medication at the exact same time every single day for birth control.  Prescription sent in.

## 2017-11-26 ENCOUNTER — Encounter: Payer: Self-pay | Admitting: *Deleted

## 2017-12-29 ENCOUNTER — Encounter (HOSPITAL_COMMUNITY): Payer: Self-pay | Admitting: Emergency Medicine

## 2017-12-29 ENCOUNTER — Emergency Department (HOSPITAL_COMMUNITY)
Admission: EM | Admit: 2017-12-29 | Discharge: 2017-12-29 | Disposition: A | Discharge status: Home / Self Care | Payer: Medicaid Other | Attending: Emergency Medicine | Admitting: Emergency Medicine

## 2017-12-29 DIAGNOSIS — M549 Dorsalgia, unspecified: Secondary | ICD-10-CM | POA: Insufficient documentation

## 2017-12-29 DIAGNOSIS — F1410 Cocaine abuse, uncomplicated: Secondary | ICD-10-CM | POA: Insufficient documentation

## 2017-12-29 DIAGNOSIS — Z79899 Other long term (current) drug therapy: Secondary | ICD-10-CM | POA: Diagnosis not present

## 2017-12-29 DIAGNOSIS — Z87891 Personal history of nicotine dependence: Secondary | ICD-10-CM | POA: Diagnosis not present

## 2017-12-29 LAB — I-STAT CHEM 8, ED
BUN: 12 mg/dL (ref 6–20)
Calcium, Ion: 1.29 mmol/L (ref 1.15–1.40)
Chloride: 104 mmol/L (ref 101–111)
Creatinine, Ser: 0.9 mg/dL (ref 0.44–1.00)
Glucose, Bld: 79 mg/dL (ref 65–99)
HCT: 37 % (ref 36.0–46.0)
Hemoglobin: 12.6 g/dL (ref 12.0–15.0)
Potassium: 3.8 mmol/L (ref 3.5–5.1)
Sodium: 142 mmol/L (ref 135–145)
TCO2: 26 mmol/L (ref 22–32)

## 2017-12-29 LAB — URINALYSIS, ROUTINE W REFLEX MICROSCOPIC
Bilirubin Urine: NEGATIVE
Glucose, UA: NEGATIVE mg/dL
Hgb urine dipstick: NEGATIVE
Ketones, ur: NEGATIVE mg/dL
Leukocytes, UA: NEGATIVE
Nitrite: NEGATIVE
Protein, ur: NEGATIVE mg/dL
Specific Gravity, Urine: 1.023 (ref 1.005–1.030)
pH: 6 (ref 5.0–8.0)

## 2017-12-29 LAB — I-STAT BETA HCG BLOOD, ED (MC, WL, AP ONLY): I-stat hCG, quantitative: <5 m[IU]/mL (ref <5)

## 2017-12-29 MED ORDER — IBUPROFEN 400 MG PO TABS
400.0000 mg | ORAL_TABLET | Freq: Once | ORAL | Status: AC | PRN
  Administered 2017-12-29: 400 mg via ORAL
  Filled 2017-12-29: qty 1

## 2017-12-29 MED ORDER — IBUPROFEN 800 MG PO TABS: 800.0000 mg | ORAL_TABLET | Freq: Three times a day (TID) | ORAL | 0 refills | Status: DC

## 2017-12-29 MED ORDER — IBUPROFEN 800 MG PO TABS
800.0000 mg | ORAL_TABLET | Freq: Once | ORAL | Status: AC
  Administered 2017-12-29: 800 mg via ORAL
  Filled 2017-12-29: qty 1

## 2017-12-29 MED ORDER — KETOROLAC TROMETHAMINE 30 MG/ML IJ SOLN: 60.0000 mg | Freq: Once | INTRAMUSCULAR | Status: DC

## 2017-12-29 NOTE — ED Notes (Signed)
Pt declined to sign signature pad for discharge instructions. This RN went over instructions in detail with the patient. Patient states "I will not sign. I understand, but I will not sign. I am unhappy."

## 2017-12-29 NOTE — ED Triage Notes (Addendum)
Pt states left upper back pain/flank pain. Woke up with the pain yesterday. No pain with urination. No blood in urine. Pt denies injury to back. No heavy lifting at a job but does have a 1510 month old. Is nursing. Hx of substance abuse.

## 2017-12-29 NOTE — ED Notes (Addendum)
Pt states "I am in so much pain I can't walk, move, talk or anything." The patient is speaking clearly and in full sentences and has active range of motion in all extremities. Pt was able to stand up, take a few steps, and sit down in wheelchair to exit upon discharge.

## 2017-12-29 NOTE — ED Provider Notes (Signed)
[facility] EMERGENCY DEPARTMENT Provider Note   CSN: [id number] Arrival date & time: 12/29/17  1258     History   Chief Complaint Chief Complaint  Patient presents with  . Back Pain    HPI Yesenia Weeks is a 42 y.o. female.  Pt comes in with c/o left flank pain that started yesterday when she woke up. Denies fever. No history of kidney stone. Hasn't taken anything. No vomiting or fever. She states that it feels like prior kidney infections. States that she doesn't feel like she is emptying enough. No lower abdominal pain. Pt is currently breastfeeding     Past Medical History:  Diagnosis Date  . ADHD (attention deficit hyperactivity disorder)   . Allergy   . Anemia    iron def  . Anxiety   . BV (bacterial vaginosis)    recurrent-uses boric acid  . Depression   . Dysmenorrhea   . Substance abuse [facility])     Patient Active Problem List   Diagnosis Date Noted  . Tenosynovitis, de Quervain 06/06/2017  . Anxiety state 08/31/2016  . Family history of breast cancer 03/31/2014  . Genital herpes 10/26/2013  . Contraception management 05/30/2011  . ACNE, MILD 09/10/2008  . ASTEATOTIC ECZEMA 09/10/2008  . HALLUX VALGUS, ACQUIRED 11/06/2006  . ANEMIA, IRON DEFICIENCY, UNSPEC. 09/19/2006  . Mood disorder (HCC) 09/19/2006  . RHINITIS, ALLERGIC 09/19/2006    Past Surgical History:  Procedure Laterality Date  . HERNIA REPAIR     x2     OB History    Gravida  4   Para  4   Term  3   Preterm  1   AB  0   Living  3     SAB  0   TAB      Ectopic      Multiple  0   Live Births  3            Home Medications    Prior to Admission medications   Medication Sig Start Date End Date Taking? Authorizing Provider  cetirizine (ZYRTEC) 10 MG tablet Take 10 mg by mouth daily as needed. 09/21/17   [provider]  norethindrone (MICRONOR,CAMILA,ERRIN) 0.35 MG tablet Take 1 tablet (0.35 mg total) by mouth daily. 10/22/17   Latrelle Dodrill, MD  Prenatal Vit-Fe Fumarate-FA (PRENATAL MULTIVITAMIN) TABS tablet Take 1 tablet by mouth daily at 12 noon.    [provider]    Family History Family History  Problem Relation Age of Onset  . Cancer Maternal Aunt        breast  . Diabetes Maternal Grandmother   . Hypertension Maternal Grandmother   . Mental illness Maternal Grandmother   . Cancer Maternal Grandmother   . Diabetes Paternal Grandmother   . Kidney disease Paternal Grandmother   . Mental illness Paternal Grandmother   . Hypertension Paternal Grandmother     Social History Social History   Tobacco Use  . Smoking status: Former Smoker    Types: Cigarettes    Last attempt to quit: 08/13/2016    Years since quitting: 1.3  . Smokeless tobacco: Never Used  Substance Use Topics  . Alcohol use: Yes    Comment: 40 onces of beer a night/stopped after +UPT  . Drug use: Yes    Types: Cocaine    Comment: prior to positive pregnancy test     Allergies   Patient has no known allergies.   Review of Systems  Review of Systems  All other systems reviewed and are negative.    Physical Exam Updated Vital Signs BP 125/90 (BP Location: Right Arm)   Pulse 70   Temp 98.3 F (36.8 C) (Oral)   Resp 17   Ht 5\' 5"  (1.651 m)   Wt 70.8 kg (156 lb)   SpO2 100%   BMI 25.96 kg/m   Physical Exam  Constitutional: She is oriented to person, place, and time. She appears well-developed and well-nourished.  HENT:  Head: Normocephalic and atraumatic.  Cardiovascular: Normal rate.  Pulmonary/Chest: Effort normal.  Abdominal: Soft. There is tenderness.  Musculoskeletal: Normal range of motion.  Neurological: She is alert and oriented to person, place, and time.  Skin: Skin is warm.  Psychiatric: She has a normal mood and affect.  Nursing note and vitals reviewed.    ED Treatments / Results  Labs (all labs ordered are listed, but only abnormal results are displayed) Labs Reviewed  URINALYSIS,  ROUTINE W REFLEX MICROSCOPIC  I-STAT BETA HCG BLOOD, ED ([facility], [facility], [facility] ONLY)  I-STAT CHEM 8, ED    EKG None  Radiology No results found.  Procedures Procedures (including critical care time)  Medications Ordered in ED Medications  ibuprofen (ADVIL,MOTRIN) tablet 800 mg (has no administration in time range)  ibuprofen (ADVIL,MOTRIN) tablet 400 mg (400 mg Oral Given 12/29/17 1329)     Initial Impression / Assessment and Plan / ED Course  I have reviewed the triage vital signs and the nursing notes.  Pertinent labs & imaging results that were available during my care of the patient were reviewed by me and considered in my medical decision making (see chart for details).     No infection noted in urine. Kidney function is normal. Pt is very upset and states that we did nothing for her and that she knows there is something wrong with her kidneys and "we just don't want to find it because she doesn't have insurance"  Final Clinical Impressions(s) / ED Diagnoses   Final diagnoses:  None    ED Discharge Orders    None       Teressa Lowerickering, Angeliah Wisdom, NP 12/29/17 1605    Doug SouJacubowitz, Sam, MD 12/29/17 1816

## 2017-12-29 NOTE — ED Notes (Signed)
Pt states "no one knows how I feel and how much pain I am in. Maybe I need to scream and cry to get some help with my pain." This RN explained to the patient that she is being sent home with a prescription for ibuprofen. NP Pickering made aware. No new orders at this time.

## 2018-01-31 LAB — GLUCOSE, POCT (MANUAL RESULT ENTRY): POC Glucose: 85 mg/dl (ref 70–99)

## 2018-02-11 ENCOUNTER — Telehealth: Payer: Self-pay | Admitting: *Deleted

## 2018-02-11 NOTE — Telephone Encounter (Signed)
Pt has a bump on her breast x 2 day, she says it looks like a white pimple.  Her breast is painful but less swollen than yesterday.  She is breast feeding and the bump has made this difficult.   Offered overflow appt this afternoon, but pt declined. She also declined appt for tomorrow pm and states that she may go to urgent care. She would like for me to check with dr. Pollie MeyerMcIntyre first, because sometimes "she slides me in". Lorieann Argueta, Maryjo RochesterJessica Dawn, CMA

## 2018-02-12 ENCOUNTER — Encounter: Payer: Self-pay | Admitting: Family Medicine

## 2018-02-12 ENCOUNTER — Ambulatory Visit (INDEPENDENT_AMBULATORY_CARE_PROVIDER_SITE_OTHER): Payer: Medicaid Other | Admitting: Family Medicine

## 2018-02-12 ENCOUNTER — Other Ambulatory Visit: Payer: Self-pay

## 2018-02-12 VITALS — BP 110/80 | HR 82 | Temp 98.1°F | Wt 152.0 lb

## 2018-02-12 DIAGNOSIS — F329 Major depressive disorder, single episode, unspecified: Secondary | ICD-10-CM

## 2018-02-12 DIAGNOSIS — O9229 Other disorders of breast associated with pregnancy and the puerperium: Secondary | ICD-10-CM | POA: Diagnosis present

## 2018-02-12 DIAGNOSIS — R4589 Other symptoms and signs involving emotional state: Secondary | ICD-10-CM

## 2018-02-12 DIAGNOSIS — S20129A Blister (nonthermal) of breast, unspecified breast, initial encounter: Secondary | ICD-10-CM

## 2018-02-12 MED ORDER — IBUPROFEN 800 MG PO TABS: 800.0000 mg | ORAL_TABLET | Freq: Three times a day (TID) | ORAL | 0 refills | Status: DC | PRN

## 2018-02-12 NOTE — Telephone Encounter (Signed)
Pt is calling again about her breast pain. She said she is still not able to breast feed her son from her right breast. She would like Dr. Pollie MeyerMcintyre to call her.

## 2018-02-12 NOTE — Patient Instructions (Addendum)
Try warm compresses before nursing on that side. This seems like a milk bleb. Let me know if it changes or worsens. Keep your appointment with me for Tuesday.  Call the Behavioral Medicine center to schedule an appointment. Their phone number is: [phone].   Be well, Dr. Pollie MeyerMcIntyre

## 2018-02-12 NOTE — Telephone Encounter (Signed)
Returned call to patient. I will see her at 3pm today to evaluate her breast. Appointment scheduled. Yesenia DodrillBrittany J Arnaldo Heffron, MD

## 2018-02-12 NOTE — Progress Notes (Signed)
Date of Visit: 02/12/2018   HPI:  Patient presents for a same day appointment to discuss an issue with her breast.   Had noticed pain around R nipple for a few days. Yesterday saw a small lump on her nipple that looked like a small abscess/boil, and got some white material out like pus. Never appeared to be a blister. No purulent discharge today. Overall seems to be improving.  She is exclusively breastfeeding her son and has been avoiding breastfeeding on that side due to the nipple pain. No fevers. Also avoiding pumping due to discomfort.  Patient also mentions continued feelings of depression. No SI/HI. Recently got medicaid back, that was limiting her ability to schedule with psychiatry. Carries dx of bipolar disorder. Breastfeeding has complicated medication options. Has not been able to wean her son even though she would like to do this. Continues to see therapist.   ROS: See HPI  PMFSH: history of mood disorder, dequervain tenosynovitis, genital herpes, iron deficiency, eczema  PHYSICAL EXAM: BP 110/80   Pulse 82   Temp 98.1 F (36.7 C) (Oral)   Wt 152 lb (68.9 kg)   SpO2 97%   BMI 25.29 kg/m  Gen: no acute distress, pleasant, cooperative Breast: R breast with normal letdown of milk while breastfeeding on L side. Small red papule present at 12oclock position on the nipple, minimally tender to palpation. No masses palpable behind nipple or elsewhere on breast. No breast warmth or erythema.   ASSESSMENT/PLAN:  1. Milk bleb - reassured patient that this appears to be a milk bleb of R nipple. No signs of mastitis. No blisters or ulcerated lesions to suggest herpetic source. Recommend warm compresses prior to nursing on the R side. Patient has appointment with me already scheduled for Tuesday of next week, will follow up then to see how things are going.  2. Depression - not fully addressed today, but patient mentioned she has been depressed still. No SI/HI. Just got medicaid back  recently and willing to schedule with psychiatry. Provided outpatient behavioral medicine clinic # for patient to call & schedule an appointment.   FOLLOW UP: Follow up in 6 days as scheduled  GrenadaBrittany J. Pollie MeyerMcIntyre, MD [facility]

## 2018-02-18 ENCOUNTER — Telehealth: Payer: Self-pay | Admitting: *Deleted

## 2018-02-18 ENCOUNTER — Ambulatory Visit (INDEPENDENT_AMBULATORY_CARE_PROVIDER_SITE_OTHER): Payer: Medicaid Other | Admitting: Family Medicine

## 2018-02-18 VITALS — BP 100/70 | HR 83 | Temp 98.5°F | Ht 67.0 in | Wt 151.4 lb

## 2018-02-18 DIAGNOSIS — J309 Allergic rhinitis, unspecified: Secondary | ICD-10-CM

## 2018-02-18 DIAGNOSIS — R5381 Other malaise: Secondary | ICD-10-CM | POA: Diagnosis not present

## 2018-02-18 DIAGNOSIS — R52 Pain, unspecified: Secondary | ICD-10-CM

## 2018-02-18 DIAGNOSIS — F39 Unspecified mood [affective] disorder: Secondary | ICD-10-CM | POA: Diagnosis not present

## 2018-02-18 DIAGNOSIS — M25552 Pain in left hip: Secondary | ICD-10-CM | POA: Diagnosis not present

## 2018-02-18 DIAGNOSIS — R5382 Chronic fatigue, unspecified: Secondary | ICD-10-CM | POA: Diagnosis present

## 2018-02-18 DIAGNOSIS — M25551 Pain in right hip: Secondary | ICD-10-CM | POA: Diagnosis not present

## 2018-02-18 DIAGNOSIS — Z309 Encounter for contraceptive management, unspecified: Secondary | ICD-10-CM

## 2018-02-18 DIAGNOSIS — Z13220 Encounter for screening for lipoid disorders: Secondary | ICD-10-CM | POA: Diagnosis not present

## 2018-02-18 LAB — POCT URINE PREGNANCY: Preg Test, Ur: NEGATIVE

## 2018-02-18 MED ORDER — PRENATAL VITAMINS 0.8 MG PO TABS: 1.0000 | ORAL_TABLET | Freq: Every day | ORAL | 11 refills | Status: DC

## 2018-02-18 MED ORDER — FLUTICASONE PROPIONATE 50 MCG/ACT NA SUSP: 1.0000 | Freq: Every day | NASAL | 3 refills | Status: DC

## 2018-02-18 MED ORDER — CETIRIZINE HCL 10 MG PO TABS: 10.0000 mg | ORAL_TABLET | Freq: Every day | ORAL | 3 refills | Status: DC | PRN

## 2018-02-18 MED ORDER — QUETIAPINE FUMARATE 50 MG PO TABS: 200.0000 mg | ORAL_TABLET | Freq: Every day | ORAL | 1 refills | Status: DC

## 2018-02-18 MED ORDER — NORETHINDRONE 0.35 MG PO TABS: 1.0000 | ORAL_TABLET | Freq: Every day | ORAL | 11 refills | Status: DC

## 2018-02-18 NOTE — Progress Notes (Signed)
Date of Visit: 02/18/2018   HPI:  Patient presents for routine follow up.  Nipple issue - I saw her last Thursday as a workin for a problem with her R nipple and diagnosed a milk bleb. Has been doing warm compresses and is now able to feed on that side, symptoms resolved.  Body pains - has pain in her hips, legs, elbows. Reports pain is constant. Also has decreased appetite. No blood in stool. No vomiting. No fevers. Daughter has history of psoriasis and sjogrens.   Depression/bipolar d/o - still working on getting in with psychiatry. No SI/HI. Feels depressed much of the time. Has not been able to get on any medications due to breastfeeding.  Birth control - exclusively breastfeeding. Needs refill on norethindrone. No period since child was born.  Allergies - needs zyrtec refilled. Also would like to try nasal spray.  ROS: See HPI.  PMF SH: allergic rhinitis, depression/anxiety, eczema, herpes  PHYSICAL EXAM: BP 100/70   Pulse 83   Temp 98.5 F (36.9 C)   Ht 5\' 7"  (1.702 m)   Wt 151 lb 6.4 oz (68.7 kg)   BMI 23.71 kg/m  Gen: nad pleasant, cooperative HEENT: normocephalic, atraumatic, moist mucous membranes  Heart: regular rate and rhythm, no murmur Lungs: clear to auscultation bilaterally, normal work of breathing  Neuro: alert, grossly nonfocal, speech normal Ext: full strength bilateral lower extremities. No joint swelling appreciated. Normal gait. Psych: normal range of affect, well groomed, speech normal in rate and volume, normal eye contact   ASSESSMENT/PLAN:  Health maintenance:  -current on HM items -hyperlipidemia screening -fasting today so will check lipids with labs as well.  Mood disorder (HCC) With significant symptoms and patient strongly desiring to start medication, has not had luck getting in with psychiatry yet. Reviewed options and we elected to start seroquel 50mg  nightly, with patient to uptitrate over 4 days to 200mg  nightly. Reviewed risks in  breastfeeding on Lactmed, counseled patient to monitor son for signs of sedation.  Contraception management Urine preg negative. Refill norethindrone.  Body aches Unclear if stress/mood related or if due to organic etiology. Has family history of autoimmune illness. Will check labs today - HIV, RF, ANA, CBC, sed rate, CMET. Also obtain xrays of bilateral hips. Advised ibuprofen 400mg  q8h as needed for aches/pains.  Allergic rhinitis Refill zyrtec, add flonase.  FOLLOW UP: Follow up in 1 mo for above issues.  GrenadaBrittany J. Pollie MeyerMcIntyre, MD [facility]

## 2018-02-18 NOTE — Telephone Encounter (Signed)
[facility].  Approved 02/18/2018 - 02/18/2019.    PA# [id number]  Pharmacy informed.  Attempted to call pt but went straight to full VM. Sahand Gosch, Maryjo RochesterJessica Dawn, CMA

## 2018-02-18 NOTE — Patient Instructions (Addendum)
It was great to see you again today.  Glad the nipple is doing better. Checking lots of labs.  Refilled birth control   Ok to take ibuprofen 400mg  three times daily as needed for aches/pains Get xrays of your hips  For allergies refilled zyrtec, also added flonase  Start seroquel 50mg  at night. On day 2, go up to 2 pills On day 3, go up to 3 pills On day 4, go up to 4 pills  Follow up in 1 month with any doctor here, sooner if needed. Call the Behavioral Medicine center to schedule an appointment. Their phone number is: [phone].   Be well, Dr. Pollie MeyerMcIntyre

## 2018-02-19 LAB — ANA: Anti Nuclear Antibody(ANA): NEGATIVE

## 2018-02-19 LAB — CMP14+EGFR
ALT: 15 IU/L (ref 0–32)
AST: 13 IU/L (ref 0–40)
Albumin/Globulin Ratio: 1.2 (ref 1.2–2.2)
Albumin: 4.2 g/dL (ref 3.5–5.5)
Alkaline Phosphatase: 103 IU/L (ref 39–117)
BUN/Creatinine Ratio: 18 (ref 9–23)
BUN: 12 mg/dL (ref 6–24)
Bilirubin Total: 0.3 mg/dL (ref 0.0–1.2)
CO2: 22 mmol/L (ref 20–29)
Calcium: 9.8 mg/dL (ref 8.7–10.2)
Chloride: 102 mmol/L (ref 96–106)
Creatinine, Ser: 0.66 mg/dL (ref 0.57–1.00)
GFR calc Af Amer: 127 mL/min/{1.73_m2} (ref >59)
GFR calc non Af Amer: 110 mL/min/{1.73_m2} (ref >59)
Globulin, Total: 3.4 g/dL (ref 1.5–4.5)
Glucose: 90 mg/dL (ref 65–99)
Potassium: 4.7 mmol/L (ref 3.5–5.2)
Sodium: 140 mmol/L (ref 134–144)
Total Protein: 7.6 g/dL (ref 6.0–8.5)

## 2018-02-19 LAB — CBC WITH DIFFERENTIAL/PLATELET
Basophils Absolute: 0 10*3/uL (ref 0.0–0.2)
Basos: 1 %
EOS (ABSOLUTE): 0.2 10*3/uL (ref 0.0–0.4)
Eos: 2 %
Hematocrit: 39.6 % (ref 34.0–46.6)
Hemoglobin: 12.6 g/dL (ref 11.1–15.9)
Immature Grans (Abs): 0 10*3/uL (ref 0.0–0.1)
Immature Granulocytes: 0 %
Lymphocytes Absolute: 2.5 10*3/uL (ref 0.7–3.1)
Lymphs: 34 %
MCH: 26.3 pg — ABNORMAL LOW (ref 26.6–33.0)
MCHC: 31.8 g/dL (ref 31.5–35.7)
MCV: 83 fL (ref 79–97)
Monocytes Absolute: 0.5 10*3/uL (ref 0.1–0.9)
Monocytes: 6 %
Neutrophils Absolute: 4.2 10*3/uL (ref 1.4–7.0)
Neutrophils: 57 %
Platelets: 266 10*3/uL (ref 150–450)
RBC: 4.79 x10E6/uL (ref 3.77–5.28)
RDW: 12 % — ABNORMAL LOW (ref 12.3–15.4)
WBC: 7.3 10*3/uL (ref 3.4–10.8)

## 2018-02-19 LAB — LIPID PANEL
Chol/HDL Ratio: 2.5 ratio (ref 0.0–4.4)
Cholesterol, Total: 175 mg/dL (ref 100–199)
HDL: 70 mg/dL (ref >39)
LDL Calculated: 93 mg/dL (ref 0–99)
Triglycerides: 61 mg/dL (ref 0–149)
VLDL Cholesterol Cal: 12 mg/dL (ref 5–40)

## 2018-02-19 LAB — RHEUMATOID FACTOR: Rhuematoid fact SerPl-aCnc: <10 IU/mL (ref 0.0–13.9)

## 2018-02-19 LAB — TSH: TSH: 1.38 u[IU]/mL (ref 0.450–4.500)

## 2018-02-19 LAB — SEDIMENTATION RATE: Sed Rate: 34 mm/hr — ABNORMAL HIGH (ref 0–32)

## 2018-02-19 LAB — HIV ANTIBODY (ROUTINE TESTING W REFLEX): HIV Screen 4th Generation wRfx: NONREACTIVE

## 2018-02-24 DIAGNOSIS — R52 Pain, unspecified: Secondary | ICD-10-CM | POA: Insufficient documentation

## 2018-02-24 NOTE — Assessment & Plan Note (Signed)
Refill zyrtec, add flonase.

## 2018-02-24 NOTE — Assessment & Plan Note (Signed)
Unclear if stress/mood related or if due to organic etiology. Has family history of autoimmune illness. Will check labs today - HIV, RF, ANA, CBC, sed rate, CMET. Also obtain xrays of bilateral hips. Advised ibuprofen 400mg  q8h as needed for aches/pains.

## 2018-02-24 NOTE — Assessment & Plan Note (Signed)
With significant symptoms and patient strongly desiring to start medication, has not had luck getting in with psychiatry yet. Reviewed options and we elected to start seroquel 50mg  nightly, with patient to uptitrate over 4 days to 200mg  nightly. Reviewed risks in breastfeeding on Lactmed, counseled patient to monitor son for signs of sedation.

## 2018-02-24 NOTE — Assessment & Plan Note (Signed)
Urine preg negative. Refill norethindrone.

## 2018-02-26 ENCOUNTER — Encounter: Payer: Self-pay | Admitting: Family Medicine

## 2018-06-13 ENCOUNTER — Ambulatory Visit: Payer: Self-pay | Admitting: Family Medicine

## 2018-06-13 ENCOUNTER — Other Ambulatory Visit: Payer: Self-pay

## 2018-06-13 ENCOUNTER — Other Ambulatory Visit (HOSPITAL_COMMUNITY)
Admission: RE | Admit: 2018-06-13 | Discharge: 2018-06-13 | Disposition: A | Discharge status: Home / Self Care | Payer: Medicaid Other | Source: Ambulatory Visit | Attending: Family Medicine | Admitting: Family Medicine

## 2018-06-13 ENCOUNTER — Ambulatory Visit (INDEPENDENT_AMBULATORY_CARE_PROVIDER_SITE_OTHER): Payer: Medicaid Other | Admitting: Family Medicine

## 2018-06-13 VITALS — Ht 67.0 in | Wt 163.0 lb

## 2018-06-13 DIAGNOSIS — N760 Acute vaginitis: Secondary | ICD-10-CM | POA: Diagnosis not present

## 2018-06-13 DIAGNOSIS — B001 Herpesviral vesicular dermatitis: Secondary | ICD-10-CM | POA: Insufficient documentation

## 2018-06-13 DIAGNOSIS — B9689 Other specified bacterial agents as the cause of diseases classified elsewhere: Secondary | ICD-10-CM | POA: Insufficient documentation

## 2018-06-13 LAB — POCT WET PREP (WET MOUNT)
Clue Cells Wet Prep Whiff POC: POSITIVE
Trichomonas Wet Prep HPF POC: ABSENT

## 2018-06-13 MED ORDER — METRONIDAZOLE 500 MG PO TABS: 2000.0000 mg | ORAL_TABLET | Freq: Once | ORAL | 0 refills | Status: AC

## 2018-06-13 NOTE — Progress Notes (Signed)
Subjective:    Patient ID: Yesenia Weeks, female    DOB: [birth year], 42 y.o.   MRN: [id number]   CC: Vaginal discharge  HPI: Vaginal discharge Presented today for vaginal discharge.  Reports that discharge is been present for 2 weeks.  States that she wanted to come in sooner but has a 38-month-old at home and had to go to lots of appointments for him and so was unable to make appointments here.  Along with discharge patient notes irritation and itching.  Has been trying yogurt and increasing water intake but with no improvement.  Recently switched soaps, detergents, and pantiliners.  Patient went back to her Yesenia Weeks soap and symptoms have seemed to improve some.  Patient reports brown discharge with a fishy odor.  Now odor almost seems like rotting.  Patient is sexually active with one partner for the past 4 years, partner is son's father.  Last unprotected intercourse was last week.  Patient has also increased shaving.  Increased sweating as well.  No recent antibiotic use.  No diabetes.  Patient is nursing currently.  Does report some burning when peeing but not insistent with every urination.  Does report decreased urination.  No recent back pain.  Cold sore Patient also reports a cold sore her bottom lip.  Has been improving for the past few days.  Patient normally has cold sores but they go away on their own.   Objective:  Ht 5\' 7"  (1.702 m)   Wt 163 lb (73.9 kg)   BMI 25.53 kg/m  Vitals and nursing note reviewed  General: well nourished, in no acute distress HEENT: normocephalic, no nasal discharge, moist mucous membranes, small healing cold sore on left lower lip Female genitalia: normal external genitalia, vulva, vagina, cervix, uterus and adnexa.  Thick white discharge with fishy odor noted on exam.  No cervical motion tenderness Skin: warm and dry, no rashes noted Neuro: alert and oriented, no focal deficits   Assessment & Plan:    Bacterial vaginosis Patient with  continued vaginal discharge and irritation. Wet prep consistent with bacterial vaginosis showing moderate clue cells and positive with test.  Normally would give metronidazole for 1 week.  However, patient is breast-feeding and per review of literature patient should not breast-feed while on metronidazole.  Have discussed this with Dr. Deirdre Priest as well.  Will give 2 g metronidazole for 1 day and inform patient not to breast-feed for 24 hours after taking medication.  Advised patient to pump and dump so that she will not lose supply.  Although this is not the typical dose for bacterial vaginosis treatment, it is the dose of trichomonas treatment, given that patient is continuing to breast-feed will need to give higher dose to treat infection.  Advised patient that in 1 week if she continues to have symptoms we may need to treat with full 7-day course.  Patient agreement with plan.  States she is trying to wean son off breast-feeding anyway so she does not mind skipping a few days of breast-feeding.  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  Culture has been present for a few days now and is improving on its own.  Given that treatment will likely not be beneficial as cold sore has already been healing for a few days patient is choosing to not use any topical treatments.  Conservative measures.  Strict return precautions given.    Return in about 1 week (around  06/20/2018), or if symptoms worsen or fail to improve.   Yesenia ManisSherin Derwin Reddy, DO, PGY-2

## 2018-06-13 NOTE — Patient Instructions (Signed)
Please let us know if you have no improvement in a week.   Our clinic's number is [phone]. Please call with questions or concerns.    Thank you,  Oralia ManisSherin Tequita Marrs, DO

## 2018-06-13 NOTE — Assessment & Plan Note (Signed)
Patient with continued vaginal discharge and irritation. Wet prep consistent with bacterial vaginosis showing moderate clue cells and positive with test.  Normally would give metronidazole for 1 week.  However, patient is breast-feeding and per review of literature patient should not breast-feed while on metronidazole.  Have discussed this with Dr. Deirdre Priesthambliss as well.  Will give 2 g metronidazole for 1 day and inform patient not to breast-feed for 24 hours after taking medication.  Advised patient to pump and dump so that she will not lose supply.  Although this is not the typical dose for bacterial vaginosis treatment, it is the dose of trichomonas treatment, given that patient is continuing to breast-feed will need to give higher dose to treat infection.  Advised patient that in 1 week if she continues to have symptoms we may need to treat with full 7-day course.  Patient agreement with plan.  States she is trying to wean son off breast-feeding anyway so she does not mind skipping a few days of breast-feeding.

## 2018-06-13 NOTE — Assessment & Plan Note (Signed)
Patient has history of cold sores and states they normally go away without any treatment.  Culture has been present for a few days now and is improving on its own.  Given that treatment will likely not be beneficial as cold sore has already been healing for a few days patient is choosing to not use any topical treatments.  Conservative measures.  Strict return precautions given.

## 2018-06-16 LAB — CERVICOVAGINAL ANCILLARY ONLY
Chlamydia: NEGATIVE
Neisseria Gonorrhea: NEGATIVE

## 2018-06-17 ENCOUNTER — Encounter: Payer: Self-pay | Admitting: *Deleted

## 2018-06-17 ENCOUNTER — Telehealth: Payer: Self-pay

## 2018-06-17 NOTE — Telephone Encounter (Signed)
Attempted to call patient with results from lab test but there was no answer and no voicemail.  Yesenia Weeks.[name], CMA

## 2018-07-04 ENCOUNTER — Telehealth: Payer: Self-pay

## 2018-07-04 ENCOUNTER — Encounter: Payer: Self-pay | Admitting: Family Medicine

## 2018-07-04 ENCOUNTER — Other Ambulatory Visit: Payer: Self-pay

## 2018-07-04 ENCOUNTER — Ambulatory Visit: Payer: Medicaid Other | Admitting: Family Medicine

## 2018-07-04 DIAGNOSIS — B9689 Other specified bacterial agents as the cause of diseases classified elsewhere: Secondary | ICD-10-CM | POA: Diagnosis not present

## 2018-07-04 DIAGNOSIS — F39 Unspecified mood [affective] disorder: Secondary | ICD-10-CM | POA: Diagnosis present

## 2018-07-04 DIAGNOSIS — N760 Acute vaginitis: Secondary | ICD-10-CM | POA: Diagnosis not present

## 2018-07-04 MED ORDER — METRONIDAZOLE 0.75 % VA GEL: 1.0000 | Freq: Two times a day (BID) | VAGINAL | 0 refills | Status: DC

## 2018-07-04 MED ORDER — METRONIDAZOLE 0.75 % VA GEL: 1.0000 | Freq: Every day | VAGINAL | 0 refills | Status: DC

## 2018-07-04 NOTE — Telephone Encounter (Signed)
Sent new rx for once daily metrogel. Thanks! Latrelle DodrillBrittany J McIntyre, MD

## 2018-07-04 NOTE — Progress Notes (Signed)
Date of Visit: 07/04/2018   HPI:  Patient presents to discuss several issues:  - bacterial vaginosis - recently treated with 2g of flagyl for BV. Some improvement after that but still having lots of odor. She is still breastfeeding. Willing to do vaginal tx.  - weaning - she is still breastfeeding her son who is 2316 months old. Really wants to wean him but is having trouble as he constantly wants to nurse and gets very fussy if she does not nurse him.  - mood - previously I had prescribed seroquel. She took this for a few days but felt like it was making her son more sleepy so she stopped. Still depressed/anxious but overall coping well. Denies SI/HI. Also having issues with concentration. Agreeable to seeing a psychiatrist.  ROS: See HPI.  PMFSH: history of mood disorder/anxiety, genital herpes  PHYSICAL EXAM: BP 112/62   Pulse 76   Temp 97.9 F (36.6 C) (Oral)   Ht 5\' 7"  (1.702 m)   Wt 164 lb (74.4 kg)   SpO2 98%   BMI 25.69 kg/m  Gen: no acute distress, pleasant, cooperative, well appearing Psych: normal range of affect, well groomed, speech normal in rate and volume, normal eye contact   ASSESSMENT/PLAN:  Health maintenance:  -current on HM items  Desire to wean Gave handouts on tips for weaning, also phone # [facility]vertising [facility].  Mood disorder (HCC) Would benefit from psychiatry evaluation given complexity of her symptoms Gave phone # for behavioral health for her to call & schedule appointment Patient agreeable to seeing them, no SI/HI.  Bacterial vaginosis Treat with vaginal metronidazole, safe in BF per infantrisk app.  FOLLOW UP: Follow up as needed if symptoms worsen or do not improve.   GrenadaBrittany J. Pollie MeyerMcIntyre, MD [facility]

## 2018-07-04 NOTE — Patient Instructions (Signed)
Lactation consultant phone # [phone]  See handouts on weaning tips  Sent in vaginal gel for you  Call the Behavioral Medicine center to schedule an appointment. Their phone number is: [phone].   Be well, Dr. Pollie MeyerMcIntyre

## 2018-07-04 NOTE — Telephone Encounter (Signed)
[facility]. Metrogel generally written as once daily and one box has 5 applicators.  Please clarify if patient to use BID and will need 2 boxes dispensed.  Call back is [phone]  Ples SpecterAlisa Myracle Febres, RN [facility] RN)

## 2018-07-07 NOTE — Assessment & Plan Note (Signed)
Treat with vaginal metronidazole, safe in BF per infantrisk app.

## 2018-07-07 NOTE — Assessment & Plan Note (Signed)
Would benefit from psychiatry evaluation given complexity of her symptoms Gave phone # for behavioral health for her to call & schedule appointment Patient agreeable to seeing them, no SI/HI.

## 2018-10-04 ENCOUNTER — Other Ambulatory Visit: Payer: Self-pay | Admitting: Family Medicine

## 2018-10-07 ENCOUNTER — Telehealth (INDEPENDENT_AMBULATORY_CARE_PROVIDER_SITE_OTHER): Payer: Medicaid Other | Admitting: Family Medicine

## 2018-10-07 DIAGNOSIS — Z9189 Other specified personal risk factors, not elsewhere classified: Secondary | ICD-10-CM

## 2018-10-07 NOTE — Telephone Encounter (Signed)
Attempted to call patient to inqiure whether or not she is still taking Seroquil per Dr. Pollie Meyer.  No voice mail or answer.  Glennie Hawk, CMA

## 2018-10-07 NOTE — Telephone Encounter (Signed)
Spoke to patient and she states that she is still nursing her baby but that she would love to go back on Seroquil because she is depressed. She wants to resume post nursing if that's ok.  She had requested it because she is trying to get all of her prescriptions in because of Covid-19.  Glennie Hawk, CMA

## 2018-10-07 NOTE — Telephone Encounter (Signed)
Called patient about seroquel request. Spoke with her after she called back.  She desires to wean from breastfeeding so she can take seroquel. Denies SI/HI. I will do research into medications that can reduce milk supply (sudafed comes to mind) to see if we can help her with weaning. Advised patient I will call her back later this week (I am on inpatient through tomorrow). Patient wants to wait until she has weaned to start seroquel, so will not rx right now.  Patient also brought up that she has sharp pains in her left breast, ongoing for several months. No blood from the nipple. The pain comes and goes, regardless of whether she is breastfeeding. Lasts at least 5 minutes. Occurring just a few times a week. No fever, no redness or warmth of the breast. Does have some itching/scratching. No lumps. Was told she couldn't get a mammogram until she had weaned for 6 months. Advised patient that symptoms she reports do not sound particularly concerning for something bad like cancer or mastitis. May need to bring her in for a clinical exam. We are trying to minimize face to face visits if possible right now due to COVID.   Will discuss with her more when I call her back later this week and potentially schedule her for a visit after I have time to do more research. Patient appreciative.  Latrelle Dodrill, MD

## 2018-10-07 NOTE — Telephone Encounter (Signed)
Attempted to reach patient to discuss - I will prescribe this I just want to speak with her first. LVM asking her to call back.  Latrelle Dodrill, MD

## 2018-10-07 NOTE — Telephone Encounter (Signed)
Pt is returning shelly's call. She said when she answered the call dropped. Pt would like for someone to call her back.

## 2018-10-07 NOTE — Telephone Encounter (Signed)
Please clarify with patient - she previously told me she only took this for a few days and stopped Is she taking this medication?  Latrelle Dodrill, MD

## 2018-10-09 NOTE — Telephone Encounter (Signed)
Patient called back and wanted Pollie Meyer to add to her notes that she has been having trouble breathing for the past few months. Patient says she thinks it may be her asthma again, but wanted to notify Pollie Meyer of this. If you have any other questions or would like to discuss this with the patient, please call her back at [phone].

## 2018-10-10 NOTE — Telephone Encounter (Signed)
Returned call to patient, no answer. LVM again asking her to call back. I should be at my desk for the next little bit. Latrelle Dodrill, MD

## 2018-10-10 NOTE — Telephone Encounter (Signed)
Called patient again, still no answer. I will plan to call her on Monday and speak with her then.  Latrelle Dodrill, MD

## 2018-10-10 NOTE — Telephone Encounter (Signed)
Tried to reach patient, no answer. LVM asking her to call back. Latrelle Dodrill, MD

## 2018-10-10 NOTE — Telephone Encounter (Signed)
Pt returning McIntyre's call - tried calling back to McIntyre's office, but no answer - told pt to keep her phone on her and I will have Pollie Meyer call her back again this afternoon. Please advise

## 2018-10-13 ENCOUNTER — Other Ambulatory Visit: Payer: Self-pay | Admitting: Family Medicine

## 2018-10-13 DIAGNOSIS — Z9189 Other specified personal risk factors, not elsewhere classified: Secondary | ICD-10-CM

## 2018-10-13 DIAGNOSIS — N644 Mastodynia: Secondary | ICD-10-CM

## 2018-10-13 NOTE — Telephone Encounter (Signed)
[facility] Telemedicine Visit  Patient consented to have visit conducted via telephone.  Encounter participants: Patient: Yesenia Weeks  Provider: Levert Feinstein  Others (if applicable): n/a  Chief Complaint: breast pain, shortness of breath, desire to wean  HPI:  Desire to wean - patient strongly desires to wean from breastfeeding. Her toddler breastfeeds often and she does not think he will stop unless milk supply is decreased. Wants to know what she can take to help with that.  Left breast pain - has sharp pain in left breast for the last several months. Lasts 5 minutes, happens a few times per week. No unusual breast lumps or skin changes, no armpit lumps either. No blood from nipple. Has strong family history of breast cancer and patient is concerned and would like mammogram. Previously was told she could not get a screening mammo until she had weaned for 6 months.   Shortness of breath - discussed this with patient. For several months patient notes having some shortness of breath, about once per week though patient has not paid attention to how often it's happening. Lasts less than 20 minutes at a time, though patient not sure exactly how long. Gets better on its own. Had similar symptoms about 4.5 years ago and I had ordered a CXR then which she never got as her medicaid was inactive.  ROS: per HPI  Pertinent PMHx: history of acne, allergic rhinitis, anxiety, genital herpes, mood disorder  Assessment/Plan:  Dyspnea - intermittent and self limited for several months, patient with history of the same several years ago. Possibly anxiety vs GERD vs allergies. Does not sound like anything particularly concerning - patient more mentioned this in passing and wanted to make me aware. Recommended patient monitor symptoms, make note of how often it is happening & how long. With COVID pandemic right now and her mild chronic symptoms, I think risk of in-office visit  outweighs benefits at this time. She knows to call if it worsens or changes.  L breast pain - warrants evaluation with imaging. Will place order for diagnostic mammogram (I believe this can still be done even as she is breastfeeding).  Desire to wean - pseudoephedrine has been successfully used by women in the past to decrease milk supply during weaning. Recommended patient follow instructions on the medication packaging regarding dosing. Cautioned her about side effect of jitteriness. Patient agreeable and appreciative.  Time spent on phone with patient: 9 minutes

## 2018-10-16 ENCOUNTER — Ambulatory Visit
Admission: RE | Admit: 2018-10-16 | Discharge: 2018-10-16 | Disposition: A | Discharge status: Home / Self Care | Payer: Medicaid Other | Source: Ambulatory Visit | Attending: Family Medicine | Admitting: Family Medicine

## 2018-10-16 ENCOUNTER — Other Ambulatory Visit: Payer: Self-pay

## 2018-10-16 ENCOUNTER — Ambulatory Visit: Payer: Self-pay

## 2018-10-16 DIAGNOSIS — Z9189 Other specified personal risk factors, not elsewhere classified: Secondary | ICD-10-CM

## 2018-11-25 ENCOUNTER — Other Ambulatory Visit: Payer: Self-pay

## 2018-11-25 ENCOUNTER — Ambulatory Visit (INDEPENDENT_AMBULATORY_CARE_PROVIDER_SITE_OTHER): Payer: Medicaid Other | Admitting: Family Medicine

## 2018-11-25 ENCOUNTER — Encounter: Payer: Self-pay | Admitting: Family Medicine

## 2018-11-25 ENCOUNTER — Ambulatory Visit: Payer: Medicaid Other | Admitting: Family Medicine

## 2018-11-25 VITALS — BP 130/78 | HR 90

## 2018-11-25 DIAGNOSIS — A6000 Herpesviral infection of urogenital system, unspecified: Secondary | ICD-10-CM

## 2018-11-25 DIAGNOSIS — R300 Dysuria: Secondary | ICD-10-CM | POA: Diagnosis present

## 2018-11-25 DIAGNOSIS — N898 Other specified noninflammatory disorders of vagina: Secondary | ICD-10-CM | POA: Insufficient documentation

## 2018-11-25 LAB — POCT URINALYSIS DIP (MANUAL ENTRY)
Bilirubin, UA: NEGATIVE
Glucose, UA: NEGATIVE mg/dL
Ketones, POC UA: NEGATIVE mg/dL
Leukocytes, UA: NEGATIVE
Nitrite, UA: NEGATIVE
Protein Ur, POC: NEGATIVE mg/dL
Spec Grav, UA: 1.02 (ref 1.010–1.025)
Urobilinogen, UA: 0.2 E.U./dL
pH, UA: 6 (ref 5.0–8.0)

## 2018-11-25 LAB — POCT WET PREP (WET MOUNT)
Clue Cells Wet Prep Whiff POC: POSITIVE
Trichomonas Wet Prep HPF POC: ABSENT
WBC, Wet Prep HPF POC: >20

## 2018-11-25 MED ORDER — ACYCLOVIR 400 MG PO TABS: 400.0000 mg | ORAL_TABLET | Freq: Three times a day (TID) | ORAL | 1 refills | Status: AC

## 2018-11-25 MED ORDER — FLUCONAZOLE 150 MG PO TABS: 150.0000 mg | ORAL_TABLET | Freq: Once | ORAL | 0 refills | Status: AC

## 2018-11-25 MED ORDER — CEPHALEXIN 500 MG PO CAPS: 500.0000 mg | ORAL_CAPSULE | Freq: Four times a day (QID) | ORAL | 0 refills | Status: DC

## 2018-11-25 NOTE — Patient Instructions (Signed)
It was a pleasure seeing you today.   Today we discussed your vaginal discharge  For your discharge: please take diflucan. If your urine culture is positive I will send in an antibiotic. Please take acyclovir for your herpes infection   Please follow up in 1-2 weeks if no improvement or sooner if symptoms persist or worsen. Please call the clinic immediately if you have any concerns.   Our clinic's number is [phone]. Please call with questions or concerns.    Thank you,  Oralia Manis, DO

## 2018-11-25 NOTE — Progress Notes (Addendum)
   Subjective:    Patient ID: Yesenia Weeks, female    DOB: January 04, 1976, 43 y.o.   MRN: [id number]   CC: vaginal discharge, herpes flare, dysuria   HPI: VAGINAL DISCHARGE Onset: 2 days   Description: white   Odor: none   Itching: yes   Symptoms Dysuria: yes  Bleeding: no  Pelvic pain: yes  Back pain: somewhat   Fever: no  Genital sores: yes  Rash: no  Dyspareunia: no  GI Sxs: no  Prior treatment: no   Red Flags: Missed period: no  Pregnancy: no  Recent antibiotics: no  Sexual activity: yes  Possible STD exposure: no  IUD: no  Diabetes: no   Dysuria Patient reports dysuria.  Also reports that urine is now cloudy and has odor to it.  This is been present for 2 days.  Only some back pain which may be unrelated.  No fevers.  Herpes outbreak Patient reports that she has had a herpes outbreak x2 days.  States that she has not had an outbreak in some time now but noticed some lesions.  States that it is very painful, hard to walk.  States she is very worried because she is breast-feeding and is not sure which medication she will be able to take..  Feels very irritated.  Has talked her PCP about this in the past and was told to see if she has an outbreak to come in to be followed up.  Objective:  BP 130/78   Pulse 90   SpO2 99%  Vitals and nursing note reviewed  General: well nourished, in no acute distress HEENT: normocephalic, no scleral icterus or conjunctival pallor, no nasal discharge, moist mucous membranes  Neck: supple  Cardiac: Regular rate  Respiratory: clear to auscultation bilaterally, no increased work of breathing Abdomen: soft, nontender, nondistended, no masses or organomegaly. Bowel sounds present Extremities: no edema or cyanosis. Warm, well perfused. 2+ radial and PT pulses bilaterally Skin: warm and dry, no rashes noted Neuro: alert and oriented, no focal deficits Female genitalia: not done normal external genitalia, vulva, vagina, cervix, uterus and  adnexa. Some white discharge noted on speculum exam. Vesicular lesion noted on left labia majora  Assessment & Plan:    Genital herpes Patient with current HSV outbreak. Will give acyclovir for treatment. Per Up-To-Date, this is suitable for breastfeeding. Strict return precautions given.   Vaginal discharge Thick white discharge noted on speculum exam. Patient has been sexually active with same partner for 5 years, denies any exposure to STDs. Likely yeast given description of symptoms and PE findings. Wet prep and GC/chlamydia swabs obtained. Diflucan RX given. Precepted to ensure this was appropriate with breastfeeding. Dysuria likely 2/2 irritation from discharge. UA negative, will obtain urine culture. If positive will treat like UTI  Wet returned on 5/6 showing BV. Patient is currently breastfeeding. Discussed options with patient. Patient stated her son is 50 years old and she was planning on stopping breastfeeding. Patient chose to use PO metronidazole. Per Up-To-Date metrogel and metronidazole pills are not safe for breastfeeding. Discussed this with Dr. Jamse Mead (preceptor) as well. Discussed importance of not breasfeeding while on metronidazole. Discussed to resume breast feeding 24hrs after last metronidazole dose. Discussed "pumping and dumping" breast milk this week to not lose supply if she desires to continue to breast feed.     Return in about 1 week (around 12/02/2018), or if symptoms worsen or fail to improve.   Oralia Manis, DO, PGY-2

## 2018-11-25 NOTE — Assessment & Plan Note (Addendum)
Patient with current HSV outbreak. Will give acyclovir for treatment. Per Up-To-Date, this is suitable for breastfeeding. Strict return precautions given.

## 2018-11-26 ENCOUNTER — Other Ambulatory Visit (HOSPITAL_COMMUNITY)
Admission: RE | Admit: 2018-11-26 | Discharge: 2018-11-26 | Disposition: A | Discharge status: Home / Self Care | Payer: Medicaid Other | Source: Ambulatory Visit | Attending: Family Medicine | Admitting: Family Medicine

## 2018-11-26 DIAGNOSIS — N898 Other specified noninflammatory disorders of vagina: Secondary | ICD-10-CM | POA: Insufficient documentation

## 2018-11-26 MED ORDER — METRONIDAZOLE 500 MG PO TABS: 500.0000 mg | ORAL_TABLET | Freq: Two times a day (BID) | ORAL | 0 refills | Status: DC

## 2018-11-26 NOTE — Assessment & Plan Note (Addendum)
Thick white discharge noted on speculum exam. Patient has been sexually active with same partner for 5 years, denies any exposure to STDs. Likely yeast given description of symptoms and PE findings. Wet prep and GC/chlamydia swabs obtained. Diflucan RX given. Precepted to ensure this was appropriate with breastfeeding. Dysuria likely 2/2 irritation from discharge. UA negative, will obtain urine culture. If positive will treat like UTI  Wet returned on 5/6 showing BV. Patient is currently breastfeeding. Discussed options with patient. Patient stated her son is 42 years old and she was planning on stopping breastfeeding. Patient chose to use PO metronidazole. Per Up-To-Date metrogel and metronidazole pills are not safe for breastfeeding. Discussed this with Dr. Jamse Mead (preceptor) as well. Discussed importance of not breasfeeding while on metronidazole. Discussed to resume breast feeding 24hrs after last metronidazole dose. Discussed "pumping and dumping" breast milk this week to not lose supply if she desires to continue to breast feed.

## 2018-11-27 LAB — CERVICOVAGINAL ANCILLARY ONLY
Chlamydia: NEGATIVE
Neisseria Gonorrhea: NEGATIVE

## 2018-11-27 LAB — URINE CULTURE: Organism ID, Bacteria: NO GROWTH

## 2018-11-28 ENCOUNTER — Telehealth: Payer: Self-pay

## 2018-11-28 ENCOUNTER — Telehealth: Payer: Self-pay | Admitting: Family Medicine

## 2018-11-28 NOTE — Telephone Encounter (Signed)
Pt wanted to talk to her PCP about a side affect of one of her medications. Please give pt a call back.

## 2018-11-28 NOTE — Telephone Encounter (Signed)
Attempted to call patient to inform her of negative test results.  No answer and voicemail box is full.  Glennie Hawk, CMA

## 2018-11-28 NOTE — Telephone Encounter (Signed)
Talked to patient over the phone. Was wondering if acyclovir would cause any problems in her son since she is breastfeeding. I explained to patient that per Up to date it is ok for breastfeeding.   Orpah Clinton, PGY-2 [facility] 11/28/2018 3:38 PM

## 2018-12-01 NOTE — Telephone Encounter (Signed)
Tried to call patient but no answer. On review of chart it appears Dr. Darin Engels already talked with patient about this in another encounter. Latrelle Dodrill, MD

## 2018-12-30 ENCOUNTER — Ambulatory Visit: Payer: Medicaid Other | Admitting: Family Medicine

## 2018-12-30 ENCOUNTER — Encounter: Payer: Self-pay | Admitting: Family Medicine

## 2018-12-30 ENCOUNTER — Other Ambulatory Visit: Payer: Self-pay

## 2018-12-30 VITALS — BP 110/80 | HR 78 | Wt 175.0 lb

## 2018-12-30 DIAGNOSIS — F39 Unspecified mood [affective] disorder: Secondary | ICD-10-CM | POA: Diagnosis not present

## 2018-12-30 DIAGNOSIS — M654 Radial styloid tenosynovitis [de Quervain]: Secondary | ICD-10-CM | POA: Diagnosis not present

## 2018-12-30 DIAGNOSIS — G8929 Other chronic pain: Secondary | ICD-10-CM

## 2018-12-30 DIAGNOSIS — M25561 Pain in right knee: Secondary | ICD-10-CM

## 2018-12-30 MED ORDER — QUETIAPINE FUMARATE 50 MG PO TABS: ORAL_TABLET | ORAL | 0 refills | Status: DC

## 2018-12-30 NOTE — Progress Notes (Signed)
Date of Visit: 12/30/2018   HPI:  R knee - has had swelling and pain of R knee on and off for several months. Lately it has become more constant. No preceding injury. Has tried elevation, applying a pressure band, heating pad. Has tried tylenol or ibuprofen without relief. Pain on front and back of knee. Nothing makes it better or worse.   L wrist - painful off and on for the last year or so. Has history of dequervains and has gotten injections for this in the past. Would like to see sports medicine about it.  Mood - history of depression/anxiety with some concern for bipolar spectrum illness. She recently stopped breastfeeding and states she is now ready to take medication for her mood. Endorses feeling angry, depressed, anxious. Has been under increased stress lately. Denies any plans of harming herself or others.   ROS: See HPI.  [place]: history of mood disorder, acne, allergic rhinitis, dequervains  PHYSICAL EXAM: BP 110/80   Pulse 78   Wt 175 lb (79.4 kg)   LMP 12/30/2018 (Exact Date)   SpO2 100%   BMI 27.41 kg/m  Gen: no acute distress, pleasant, cooperative, well appearing HEENT: normocephalic, atraumatic, moist mucous membranes  Lungs: normal work of breathing  Neuro: alert, grossly nonfocal, speech noraml Ext: L wrist without deformity, erythema, or swelling. +tender to palpation over radial styloid.  R knee with mild effusion but no warmth or erythema. Some crepitus with flexion & extension. Full range of motion. + tenderness over medial joint line, with tender fluctuant area on medial aspect of knee  ASSESSMENT/PLAN:  Tenosynovitis, de Quervain Refer back to sports medicine, may benefit from injections  Mood disorder ([place]) Patient now wanting to take medication for her mood Previously took seroquel but stopped it as she was breastfeeding and felt like her son was getting sedated from it Will restart at 50mg  tonight, uptitrate to 200mg  per night over 4 nights. Follow up  via virtual visit in 2 weeks. Discussed safety precautions.  Right knee pain Tenderness and swelling over medial joint line. Will check xrays and refer to sports medicine. May benefit from ultrasound or even MRI to further assess.  FOLLOW UP: Follow up in 2 weeks for virtual visit Refer to sports medicine  [place] J. Ardelia Mems, [place]

## 2018-12-30 NOTE — Patient Instructions (Signed)
Sent in seroquel for you 1 tab on first night 2 tabs on second night 3 tabs on third night 4 tabs on fourth night and after that  Referring to sports medicine Getting xrays  Follow up in 2 weeks with me for virtual visit If you have any thoughts of hurting yourself or anyone else, go to the Emergency Room to stay safe.   Be well, Dr. Ardelia Mems

## 2018-12-31 ENCOUNTER — Other Ambulatory Visit: Payer: Self-pay | Admitting: Family Medicine

## 2018-12-31 ENCOUNTER — Ambulatory Visit
Admission: RE | Admit: 2018-12-31 | Discharge: 2018-12-31 | Disposition: A | Discharge status: Home / Self Care | Payer: Medicaid Other | Source: Ambulatory Visit | Attending: Family Medicine | Admitting: Family Medicine

## 2018-12-31 DIAGNOSIS — G8929 Other chronic pain: Secondary | ICD-10-CM

## 2018-12-31 DIAGNOSIS — M25561 Pain in right knee: Secondary | ICD-10-CM | POA: Insufficient documentation

## 2018-12-31 NOTE — Assessment & Plan Note (Signed)
Tenderness and swelling over medial joint line. Will check xrays and refer to sports medicine. May benefit from ultrasound or even MRI to further assess.

## 2018-12-31 NOTE — Assessment & Plan Note (Signed)
Refer back to sports medicine, may benefit from injections

## 2018-12-31 NOTE — Assessment & Plan Note (Signed)
Patient now wanting to take medication for her mood Previously took seroquel but stopped it as she was breastfeeding and felt like her son was getting sedated from it Will restart at 50mg  tonight, uptitrate to 200mg  per night over 4 nights. Follow up via virtual visit in 2 weeks. Discussed safety precautions.

## 2019-01-02 ENCOUNTER — Encounter: Payer: Self-pay | Admitting: Family Medicine

## 2019-01-07 ENCOUNTER — Ambulatory Visit: Payer: Medicaid Other | Admitting: Family Medicine

## 2019-01-13 ENCOUNTER — Other Ambulatory Visit: Payer: Self-pay

## 2019-01-13 ENCOUNTER — Ambulatory Visit: Payer: Medicaid Other | Admitting: Family Medicine

## 2019-01-13 VITALS — BP 110/76 | Ht 67.0 in | Wt 175.0 lb

## 2019-01-13 DIAGNOSIS — M654 Radial styloid tenosynovitis [de Quervain]: Secondary | ICD-10-CM | POA: Diagnosis not present

## 2019-01-13 DIAGNOSIS — M25561 Pain in right knee: Secondary | ICD-10-CM | POA: Diagnosis not present

## 2019-01-13 DIAGNOSIS — G8929 Other chronic pain: Secondary | ICD-10-CM | POA: Diagnosis not present

## 2019-01-13 MED ORDER — METHYLPREDNISOLONE ACETATE 40 MG/ML IJ SUSP
20.0000 mg | Freq: Once | INTRAMUSCULAR | Status: AC
  Administered 2019-01-13: 17:00:00 20 mg via INTRA_ARTICULAR

## 2019-01-13 NOTE — Patient Instructions (Signed)
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 Avoid painful activities as much as possible. Wear the thumb spica brace as often as possible to rest this. Ice 15 minutes at a time 3-4 times a day. A cortisone injection typically helps a great deal with this and is an option - you were given this today. Follow up with me in 6 weeks for reevaluation.  You have patellofemoral syndrome. Avoid painful activities when possible (often deep squats, lunges bother this). Cross train with swimming, cycling with low resistance, elliptical if needed. Straight leg raise, hip side raises, straight leg raises with foot turned outwards 3 sets of 10 once a day. Add ankle weight if these become too easy. Consider formal physical therapy. Consider something like dr. Zoe Lan active series, spencos, or our green sports insoles. Avoid flat shoes, barefoot walking as much as possible. Icing 15 minutes at a time 3-4 times a day as needed. Tylenol or ibuprofen as needed for pain. Follow up with me in 6 weeks.

## 2019-01-14 ENCOUNTER — Encounter: Payer: Self-pay | Admitting: Family Medicine

## 2019-01-14 NOTE — Progress Notes (Signed)
PCP: Leeanne Rio, MD  Subjective:   HPI: Patient is a 43 y.o. female here for right knee, left wrist pain.  Patient reports she previously had problems with her right wrist and more recently has had pain on the radial aspect of her left wrist for several months that has been off and on but more consistent recently. She tried some over-the-counter medications like Tylenol. She is recently been using a wrist brace and heat though it sounds like she is using the wrist brace that she had previously used for her right sided de Quervain's. Pain level is an 8+ out of 10 and sharp.  Worse when she is trying to lift her child or do anything with her thumb. She also reports some right knee pain that is also been intermittent for the past several months that she localizes to the anterior knee. No skin changes or numbness.  Past Medical History:  Diagnosis Date  . ADHD (attention deficit hyperactivity disorder)   . Allergy   . Anemia    iron def  . Anxiety   . BV (bacterial vaginosis)    recurrent-uses boric acid  . Depression   . Dysmenorrhea   . Substance abuse ([place])     Current Outpatient Medications on File Prior to Visit  Medication Sig Dispense Refill  . fluticasone (FLONASE) 50 MCG/ACT nasal spray Place 1 spray into both nostrils daily. 16 g 3  . NORLYDA 0.35 MG tablet     . Prenatal 27-1 MG TABS TK 1 T PO QD    . QUEtiapine (SEROQUEL) 50 MG tablet Take 50mg  on day 1, 100mg  on day 2, 150mg  on day 3, and 200mg  on day 4 and thereafter 120 tablet 0   No current facility-administered medications on file prior to visit.     Past Surgical History:  Procedure Laterality Date  . HERNIA REPAIR     x2    No Known Allergies  Social History   Socioeconomic History  . Marital status: Single    Spouse name: Not on file  . Number of children: 2  . Years of education: 25  . Highest education level: Not on file  Occupational History  . Occupation: [employer]  Social  Needs  . Financial resource strain: Not on file  . Food insecurity    Worry: Not on file    Inability: Not on file  . Transportation needs    Medical: Not on file    Non-medical: Not on file  Tobacco Use  . Smoking status: Former Smoker    Types: Cigarettes    Quit date: 08/13/2016    Years since quitting: 2.4  . Smokeless tobacco: Never Used  Substance and Sexual Activity  . Alcohol use: Yes    Comment: 40 onces of beer a night/stopped after +UPT  . Drug use: Yes    Types: Cocaine    Comment: prior to positive pregnancy test  . Sexual activity: Not Currently    Birth control/protection: None  Lifestyle  . Physical activity    Days per week: Not on file    Minutes per session: Not on file  . Stress: Not on file  Relationships  . Social Herbalist on phone: Not on file    Gets together: Not on file    Attends religious service: Not on file    Active member of club or organization: Not on file    Attends meetings of clubs or organizations: Not on  file    Relationship status: Not on file  . Intimate partner violence    Fear of current or ex partner: Not on file    Emotionally abused: Not on file    Physically abused: Not on file    Forced sexual activity: Not on file  Other Topics Concern  . Not on file  Social History Narrative  . Not on file    Family History  Problem Relation Age of Onset  . Cancer Maternal Aunt        breast  . Breast cancer Maternal Aunt   . Diabetes Maternal Grandmother   . Hypertension Maternal Grandmother   . Mental illness Maternal Grandmother   . Cancer Maternal Grandmother   . Breast cancer Maternal Grandmother   . Diabetes Paternal Grandmother   . Kidney disease Paternal Grandmother   . Mental illness Paternal Grandmother   . Hypertension Paternal Grandmother   . Breast cancer Maternal Aunt     BP 110/76   Ht 5\' 7"  (1.702 m)   Wt 175 lb (79.4 kg)   LMP 12/30/2018 (Exact Date)   BMI 27.41 kg/m   Review of  Systems: See HPI above.     Objective:  Physical Exam:  Gen: NAD, comfortable in exam room  Right knee: No gross deformity, ecchymoses, swelling. TTP mildly post patellar facets, less medial joint line.  No other tenderness. FROM with 5/5 strength. Negative ant/post drawers. Negative valgus/varus testing. Negative lachmans. Negative mcmurrays, apleys, patellar apprehension. NV intact distally.  Left knee: No deformity. FROM with 5/5 strength. No tenderness to palpation. NVI distally.  Left wrist: No deformity, swelling, bruising. FROM with 5/5 strength but pain on thumb extension, abduction. TTP 1st dorsal compartment.  No other tenderness. Positive finkelsteins. Negative tinels. NVI distally.   Assessment & Plan:  1. Left wrist pain -secondary to de Quervain's tenosynovitis.  We discussed options.  She will use a thumb spica brace with icing.  She was given an injection today as well.  She will follow-up in 6 weeks.  After informed written consent patient was seated on exam table.  Area overlying left first dorsal compartment was prepped with alcohol swab after using ultrasound to identify the tendon sheath.  Then left first dorsal compartment of the wrist was injected with 0.5 to 0.5 mL bupivacaine to Depo-Medrol.  Injection was tolerated well without immediate complications.  2.  Right knee pain- secondary to patellofemoral syndrome.  We reviewed home exercises to do daily.  Icing,tylenol or ibuprofen.  F/u in 6 weeks.  Consider physical therapy.

## 2019-01-19 ENCOUNTER — Telehealth: Payer: Self-pay | Admitting: Family Medicine

## 2019-01-19 NOTE — Telephone Encounter (Signed)
Pt called he office stating that he son was diagnosed with e coli and that since she's taking care of him she has now had an upset stomach and was wanting to know if there was something she could be prescribed. Please give pt a call.

## 2019-01-20 NOTE — Telephone Encounter (Signed)
Patient called back & I spoke with her No loose stools, just having more frequent bowel movements Son is not being treated with antibiotics, states his pediatrician said it would just need to run its course Reviewed with patient that we don't typically use antibiotics to treat e coli, that it will improve with time. Gave return precautions of fever, abdominal pain, blood in stool. To call back if worsening Advised good hand hygiene Patient appreciative Leeanne Rio, MD

## 2019-01-20 NOTE — Telephone Encounter (Signed)
Attempted to reach patient via phone to discuss, no answer. LVM offering for her to call back.  Leeanne Rio, MD

## 2019-02-16 ENCOUNTER — Ambulatory Visit: Payer: Medicaid Other | Admitting: Family Medicine

## 2019-02-20 ENCOUNTER — Ambulatory Visit: Payer: Medicaid Other | Admitting: Family Medicine

## 2019-02-23 ENCOUNTER — Ambulatory Visit: Payer: Medicaid Other | Admitting: Family Medicine

## 2019-02-27 ENCOUNTER — Other Ambulatory Visit: Payer: Self-pay

## 2019-02-27 ENCOUNTER — Encounter: Payer: Self-pay | Admitting: Family Medicine

## 2019-02-27 ENCOUNTER — Telehealth: Payer: Self-pay | Admitting: Family Medicine

## 2019-02-27 ENCOUNTER — Telehealth (INDEPENDENT_AMBULATORY_CARE_PROVIDER_SITE_OTHER): Payer: Medicaid Other | Admitting: Family Medicine

## 2019-02-27 DIAGNOSIS — Z20828 Contact with and (suspected) exposure to other viral communicable diseases: Secondary | ICD-10-CM

## 2019-02-27 DIAGNOSIS — Z20822 Contact with and (suspected) exposure to covid-19: Secondary | ICD-10-CM

## 2019-02-27 MED ORDER — ALBUTEROL SULFATE HFA 108 (90 BASE) MCG/ACT IN AERS: 2.0000 | INHALATION_SPRAY | Freq: Four times a day (QID) | RESPIRATORY_TRACT | 0 refills | Status: DC | PRN

## 2019-02-27 NOTE — Telephone Encounter (Signed)
Pt called because she wanted to be tested for covid. Her son has already been tested positive please call her. jw

## 2019-02-27 NOTE — Telephone Encounter (Signed)
Spoke with patient and reiterated what she and Dr. Gwendlyn Deutscher spoke about this morning.  Patient states that she is currently at [facility] with her 42 yo son and doesn't have anyone else who can sit with him while she goes to the emergency room.  I explained to patient that Dr. Macario Golds treatment plan wouldn't change because patient is currently symptomatic besides some SOB.  Patient states that she did have asthma as an adolescent but has not had any trouble in "years".  She was prescribed albuterol per [place] and hasn't picked it up yet.  I explained to patient how the albuterol would work and this would likely resolve her SOB symptoms.  I also let her know that once she is symptom free for 2 weeks, we should make an appointment to discuss her asthma as maybe it was re-triggered by a virus she may have had a few weeks ago.  I explained to patient that if we were to send her for testing at a testing site and it was confirmed positive or negative then we would still ask that she self quarantine at home.  Patient states that she has been doing this except when having to bring her son back and forth to the hospital.  No further questions from patient and gave her signs and symptoms to keep a watch for once they get home. Gianno Volner,CMA

## 2019-02-27 NOTE — Progress Notes (Signed)
[place] Telemedicine Visit  Patient consented to have virtual visit. Method of visit: Video was attempted, but technology challenges prevented patient from using video, so visit was conducted via telephone.  Encounter participants: Patient: Yesenia Weeks - located at Hospital with son admitted Provider: Andrena Mews - located at office Others (if applicable): N/A  Chief Complaint: SOB  HPI:  Patient age: 43 y.o. due to hx exposure to COVID-19 positive patient. Patient has been sick for more than 1 week even before her son got tested positive.  Symptoms: Shortness of breath. No fever of cough currently. She has fever, fatigue and cough a week ago.  Underlying Conditions: No underlying conditions  Is the patient a first responder? No  Does the patient live or work in a high risk or high density environment: No  Is the patient a COVID convalescent patient who is 14-28 days symptom-free and interested in donating plasma for use as a therapeutic product? No  ROS: per HPI  Pertinent PMHx: Problem list reviewed  Exam:  Respiratory: No distress with talking with her over the telephone  Assessment/Plan:  Exposure to Covid-19 Virus I was not able to fully assess her since the video visit did not go well. I advised her that if she is having SOB, she need to get evaluated and since she is in the hospital, she should go to the ED for an evaluation. She said she has no one to watch her son. I advised her to discuss with the floor nurse and the pediatrician about her symptoms and the need for her to be evaluated and they should be able to make an arrangement for someone to watch her son if appropriate. I will escript albuterol prn for her since she said she needed something for her breathing. But I stressed the need for her to be evaluated today and she agreed with the plan. She will contact our office for f/u as needed. She agreed with the plan and  verbalized understanding.    Time spent during visit with patient: 50 minutes COVID Drive-Up Test Referral Criteria   More than 50% of this face to face encounter was spent on counseling and coordination of care.

## 2019-02-27 NOTE — Assessment & Plan Note (Signed)
I was not able to fully assess her since the video visit did not go well. I advised her that if she is having SOB, she need to get evaluated and since she is in the hospital, she should go to the ED for an evaluation. She said she has no one to watch her son. I advised her to discuss with the floor nurse and the pediatrician about her symptoms and the need for her to be evaluated and they should be able to make an arrangement for someone to watch her son if appropriate. I will escript albuterol prn for her since she said she needed something for her breathing. But I stressed the need for her to be evaluated today and she agreed with the plan. She will contact our office for f/u as needed. She agreed with the plan and verbalized understanding.

## 2019-04-22 ENCOUNTER — Other Ambulatory Visit: Payer: Self-pay | Admitting: Family Medicine

## 2019-05-22 ENCOUNTER — Other Ambulatory Visit: Payer: Self-pay

## 2019-05-22 ENCOUNTER — Ambulatory Visit (INDEPENDENT_AMBULATORY_CARE_PROVIDER_SITE_OTHER): Payer: Medicaid Other | Admitting: Family Medicine

## 2019-05-22 VITALS — BP 122/80 | HR 76 | Temp 98.6°F | Wt 184.8 lb

## 2019-05-22 DIAGNOSIS — R109 Unspecified abdominal pain: Secondary | ICD-10-CM | POA: Diagnosis not present

## 2019-05-22 DIAGNOSIS — Z23 Encounter for immunization: Secondary | ICD-10-CM

## 2019-05-22 LAB — POCT URINALYSIS DIP (MANUAL ENTRY)
Bilirubin, UA: NEGATIVE
Blood, UA: NEGATIVE
Glucose, UA: NEGATIVE mg/dL
Ketones, POC UA: NEGATIVE mg/dL
Leukocytes, UA: NEGATIVE
Nitrite, UA: NEGATIVE
Protein Ur, POC: NEGATIVE mg/dL
Spec Grav, UA: 1.015 (ref 1.010–1.025)
Urobilinogen, UA: 0.2 E.U./dL
pH, UA: 6.5 (ref 5.0–8.0)

## 2019-05-22 MED ORDER — CYCLOBENZAPRINE HCL 10 MG PO TABS: 10.0000 mg | ORAL_TABLET | Freq: Three times a day (TID) | ORAL | 0 refills | Status: DC | PRN

## 2019-05-22 MED ORDER — IBUPROFEN 800 MG PO TABS: 800.0000 mg | ORAL_TABLET | Freq: Three times a day (TID) | ORAL | 0 refills | Status: DC | PRN

## 2019-05-22 MED ORDER — TRAMADOL HCL 50 MG PO TABS: 50.0000 mg | ORAL_TABLET | Freq: Three times a day (TID) | ORAL | 0 refills | Status: AC | PRN

## 2019-05-22 NOTE — Patient Instructions (Signed)
Sent in 3 medications for you:  Flexeril - muscle relaxer  Ibuprofen - anti inflammatory, pain med  Tramadol - stronger pain medication   Call Monday if you aren't doing much better  Be well, Dr. Ardelia Mems    Thoracic Strain A thoracic strain, which is sometimes called a mid-back strain, is an injury to the muscles or tendons that attach to the upper part of your back behind your chest. This type of injury occurs when a muscle is overstretched or overloaded. Thoracic strains can range from mild to severe. Mild strains may involve stretching a muscle or tendon without tearing it. These injuries may heal in 1-2 weeks. More severe strains involve tearing of muscle fibers or tendons. These will cause more pain and may take 6-8 weeks to heal. What are the causes? This condition may be caused by:  Trauma, such as a fall or a hit to the body.  Twisting or overstretching the back. This may result from doing activities that require a lot of energy, such as lifting heavy objects. In some cases, the cause may not be known. What increases the risk? This injury is more common in:  Athletes.  People with obesity. What are the signs or symptoms? The main symptom of this condition is pain in the middle back, especially with movement. Other symptoms include:  Stiffness or limited range of motion.  Sudden muscle tightening (spasms). How is this diagnosed? This condition may be diagnosed based on:  Your symptoms.  Your medical history.  A physical exam.  Imaging tests, such as X-rays or an MRI. How is this treated? This condition may be treated with:  Resting the injured area.  Applying heat and cold to the injured area.  Over-the-counter medicines for pain and inflammation, such as NSAIDs.  Prescription pain medicine or muscle relaxants may be needed for a short time.  Physical therapy. This will involve doing stretching and strengthening exercises. Follow these instructions at  home: Managing pain, stiffness, and swelling      If directed, put ice on the injured area. ? Put ice in a plastic bag. ? Place a towel between your skin and the bag. ? Leave the ice on for 20 minutes, 2-3 times a day.  If directed, apply heat to the affected area as often as told by your health care provider. Use the heat source that your health care provider recommends, such as a moist heat pack or a heating pad. ? Place a towel between your skin and the heat source. ? Leave the heat on for 20-30 minutes. ? Remove the heat if your skin turns bright red. This is especially important if you are unable to feel pain, heat, or cold. You may have a greater risk of getting burned. Activity  Rest and return to your normal activities as told by your health care provider. Ask your health care provider what activities are safe for you.  Do exercises as told by your health care provider. Medicines  Take over-the-counter and prescription medicines only as told by your health care provider.  Ask your health care provider if the medicine prescribed to you: ? Requires you to avoid driving or using heavy machinery. ? Can cause constipation. You may need to take these actions to prevent or treat constipation:  Drink enough fluid to keep your urine pale yellow.  Take over-the-counter or prescription medicines.  Eat foods that are high in fiber, such as beans, whole grains, and fresh fruits and vegetables.  Limit  foods that are high in fat and processed sugars, such as fried or sweet foods. Injury prevention To prevent a future mid-back injury:  Always warm up properly before physical activity or sports.  Cool down and stretch after being active.  Use correct form when playing sports and lifting heavy objects. Bend your knees before you lift heavy objects.  Use good posture when sitting and standing.  Stay physically fit and maintain a healthy weight. ? Do at least 150 minutes of  moderate-intensity exercise each week, such as brisk walking or water aerobics. ? Do strength exercises at least 2 times each week.  General instructions  Do not use any products that contain nicotine or tobacco, such as cigarettes, e-cigarettes, and chewing tobacco. If you need help quitting, ask your health care provider.  Keep all follow-up visits as told by your health care provider. This is important. Contact a health care provider if:  Your pain is not helped by medicine.  Your pain or stiffness is getting worse.  You develop pain or stiffness in your neck or lower back. Get help right away if you:  Have shortness of breath.  Have chest pain.  Develop numbness or weakness in your legs or arms.  Have involuntary loss of urine (urinary incontinence). Summary  A thoracic strain, which is sometimes called a mid-back strain, is an injury to the muscles or tendons that attach to the upper part of your back behind your chest.  This type of injury occurs when a muscle is overstretched or overloaded.  Rest and return to your normal activities as told by your health care provider. If directed, apply heat or ice to the affected area as often as told by your health care provider.  Take over-the-counter and prescription medicines only as told by your health care provider.  Contact a health care provider if you have new or worsening symptoms. This information is not intended to replace advice given to you by your health care provider. Make sure you discuss any questions you have with your health care provider. Document Released: 09/29/2003 Document Revised: 05/27/2018 Document Reviewed: 05/27/2018 Elsevier Patient Education  2020 [employer].

## 2019-05-22 NOTE — Progress Notes (Signed)
Date of Visit: 05/22/2019   HPI:  Patient presents for a same day appointment to discuss back pain.  Had had a dull ache in back for several days, but on Tuesday bent down to pick up her 42 year old son and had sudden increase in pain. Located in L flank. Now pain with position changes and certain movements/twisting of back. Pain wraps around some to the front underneath her ribs. No fevers. No dysuria. Has had less urine output than she would have expected given how much she has been drinking. Took a friend's 800mg  ibuprofen tablets, as well as tylenol#3. Is very uncomfortable and requesting pain relief.   ROS: See HPI  [place]: history of anxiety, mood disorder  PHYSICAL EXAM: BP 122/80   Pulse 76   Temp 98.6 F (37 C)   Wt 184 lb 12.8 oz (83.8 kg)   SpO2 98%   BMI 28.94 kg/m  Gen: no acute distress, pleasant, cooperative Back: palpable spasm of paraspinal muscles/latissimus dorsi on L flank. Tender to light palpation in this area. No skin changes. Full strength bilateral lower extremities. No abdominal or significant rib tenderness anteriorly.  ASSESSMENT/PLAN:  Health maintenance: - flu shot given today   Back spasm Obvious spasm/strain of L sided back musculature (lat dorsi). Patient quite uncomfortable.  UA without signs of infection - doubt renal etiology especially given exam findings. No red flags. Will treat with NSAID, muscle relaxer, and provide small quantity of tramadol if needed for pain relief. Follow up if not improving. cousneled patient on risk of sedation with these medications. Gave handout with instructions/exercises.  FOLLOW UP: Follow up as needed if symptoms worsen or do not improve.   [place]. Ardelia Mems, [place]

## 2019-05-25 ENCOUNTER — Encounter: Payer: Self-pay | Admitting: Family Medicine

## 2019-05-25 ENCOUNTER — Telehealth (INDEPENDENT_AMBULATORY_CARE_PROVIDER_SITE_OTHER): Payer: Medicaid Other | Admitting: Family Medicine

## 2019-05-25 ENCOUNTER — Other Ambulatory Visit: Payer: Self-pay

## 2019-05-25 DIAGNOSIS — K120 Recurrent oral aphthae: Secondary | ICD-10-CM

## 2019-05-25 MED ORDER — BENZOCAINE 10 % MT GEL: 1.0000 "application " | OROMUCOSAL | 0 refills | Status: DC | PRN

## 2019-05-25 NOTE — Progress Notes (Signed)
[place] Telemedicine Visit  Patient consented to have virtual visit. Method of visit: Video  Encounter participants: Patient: Yesenia Weeks - located at home in [place] Provider: Nuala Alpha - located at [facility] Others (if applicable): none  Chief Complaint: mouth sores  HPI:  Patient is a 43y/o female with no pertinent past medical history presenting today for sores under her tongue and on the inside of her mouth after starting tramadol and flexeril for an episode of acute low back pain. She was seen in our clinic for the low back pain that was thought to be due to an acute muscle strain. Once she noticed the mouth sores which appeared 2-3 days after starting the medications she stopped taking them. The sores make eating and drinking painful and are especially painful and irritated when eating or drinking acidic foods. She is able to tolerate drinking Ensures.  ROS: per HPI  Pertinent PMHx: Iron deficiency Anemia  Exam:  Gen: NAD Mouth: whitish lesion under the left side of the tongue seen Respiratory: NWOB, speaking in full sentences with no signs of respiratory distress Skin: warm, dry, intact, no rashes  Assessment/Plan:  Canker sores oral From history and looking at the area via video seems to be consistent with a canker sore. Considering it is painful and an acute finding less likely to be malignant. Could have been brought on by stress and/or helped by xerostomia which is a side effect of flexeril (6-32%). Tramadol could also cause it but rate of xerostomia in tramadol was much lower. - Gargle with warm salt water, apply oragel as needed - Advised patient that if this does not self resolve in several days to have [place] re-evaluate - Advised patient to restart Tramadol as I think it is unlikely this is contributing to the formation of her canker sore. Holding flexeril    Time spent during visit with patient: >20 minutes  Harolyn Rutherford, DO [facility], PGY-3

## 2019-05-25 NOTE — Assessment & Plan Note (Signed)
From history and looking at the area via video seems to be consistent with a canker sore. Considering it is painful and an acute finding less likely to be malignant. Could have been brought on by stress and/or helped by xerostomia which is a side effect of flexeril (6-32%). Tramadol could also cause it but rate of xerostomia in tramadol was much lower. - Gargle with warm salt water, apply oragel as needed - Advised patient that if this does not self resolve in several days to have [place] re-evaluate - Advised patient to restart Tramadol as I think it is unlikely this is contributing to the formation of her canker sore. Holding flexeril

## 2019-06-15 ENCOUNTER — Other Ambulatory Visit: Payer: Self-pay

## 2019-06-15 ENCOUNTER — Other Ambulatory Visit (HOSPITAL_COMMUNITY)
Admission: RE | Admit: 2019-06-15 | Discharge: 2019-06-15 | Disposition: A | Discharge status: Home / Self Care | Payer: Medicaid Other | Source: Ambulatory Visit | Attending: Family Medicine | Admitting: Family Medicine

## 2019-06-15 ENCOUNTER — Ambulatory Visit (INDEPENDENT_AMBULATORY_CARE_PROVIDER_SITE_OTHER): Payer: Medicaid Other | Admitting: Family Medicine

## 2019-06-15 VITALS — BP 130/70 | HR 103 | Ht 67.0 in | Wt 184.4 lb

## 2019-06-15 DIAGNOSIS — A6004 Herpesviral vulvovaginitis: Secondary | ICD-10-CM | POA: Diagnosis not present

## 2019-06-15 DIAGNOSIS — Z3201 Encounter for pregnancy test, result positive: Secondary | ICD-10-CM | POA: Diagnosis not present

## 2019-06-15 DIAGNOSIS — N898 Other specified noninflammatory disorders of vagina: Secondary | ICD-10-CM | POA: Insufficient documentation

## 2019-06-15 DIAGNOSIS — B9689 Other specified bacterial agents as the cause of diseases classified elsewhere: Secondary | ICD-10-CM | POA: Diagnosis not present

## 2019-06-15 DIAGNOSIS — Z3200 Encounter for pregnancy test, result unknown: Secondary | ICD-10-CM | POA: Diagnosis present

## 2019-06-15 DIAGNOSIS — Z3490 Encounter for supervision of normal pregnancy, unspecified, unspecified trimester: Secondary | ICD-10-CM | POA: Diagnosis not present

## 2019-06-15 DIAGNOSIS — N760 Acute vaginitis: Secondary | ICD-10-CM

## 2019-06-15 LAB — POCT WET PREP (WET MOUNT)
Clue Cells Wet Prep Whiff POC: POSITIVE
Trichomonas Wet Prep HPF POC: ABSENT

## 2019-06-15 LAB — POCT URINE PREGNANCY: Preg Test, Ur: POSITIVE — AB

## 2019-06-15 MED ORDER — METRONIDAZOLE 500 MG PO TABS: 500.0000 mg | ORAL_TABLET | Freq: Two times a day (BID) | ORAL | 0 refills | Status: AC

## 2019-06-15 MED ORDER — FLUTICASONE PROPIONATE 50 MCG/ACT NA SUSP: 1.0000 | Freq: Every day | NASAL | 3 refills | Status: DC

## 2019-06-15 MED ORDER — PRENATAL 27-1 MG PO TABS: 1.0000 | ORAL_TABLET | Freq: Every day | ORAL | 3 refills | Status: DC

## 2019-06-15 MED ORDER — VALACYCLOVIR HCL 1 G PO TABS: 1000.0000 mg | ORAL_TABLET | Freq: Every day | ORAL | 0 refills | Status: AC

## 2019-06-15 NOTE — Assessment & Plan Note (Signed)
1-2 vesicles present along R perianal area with associated exquisite tenderness on exam, consistent with herpes outbreak.  Will send Valtrex prescription.  Advised patient to abstain from intercourse during outbreak, also advised to wear condoms if she does have intercourse.  Given now current pregnancy, will require suppression at 36 weeks.

## 2019-06-15 NOTE — Progress Notes (Signed)
Subjective:   Patient ID: Yesenia Weeks    DOB: 01/16/1976, 43 y.o. female   MRN: [id number]  Yesenia Weeks is a 43 y.o. female with a history of lactic rhinitis, eczema, de Quervain's tenosynovitis, herpes, anemia, mood disorder here for   Positive pregnancy test - had positive pregnancy test x2 over the weekend - unsure of LMP, had one day of bleeding and cramping in October. Prior to that, last normal period she thinks was in September.  - has h/o irregular periods. - [id number] - was on OCPs, a few missed doses - Symptoms: feels her taste is off, mood swings. - endorses some abnormal vaginal discharge, irritation and odor, similar to prior BV infections.  - also with some perianal pain, thinks her hemorrhoids are flaring. Has not noticed any rashes.  Review of Systems:  Per HPI.  Medications and smoking status reviewed.  Objective:   BP 130/70   Pulse (!) 103   Ht 5\' 7"  (1.702 m)   Wt 184 lb 6 oz (83.6 kg)   LMP 05/18/2019   SpO2 99%   BMI 28.88 kg/m  Vitals and nursing note reviewed.  General: well nourished, well developed, in no acute distress with non-toxic appearance GYN:  External genitalia with 1-2 vesicles present along R perianal area.  Vaginal mucosa pink, moist, normal rugae.  Nonfriable cervix without lesions, copious amounts of white discharge noted on speculum exam. No bleeding.  No cervical motion tenderness.   Assessment & Plan:   Genital herpes 1-2 vesicles present along R perianal area with associated exquisite tenderness on exam, consistent with herpes outbreak.  Will send Valtrex prescription.  Advised patient to abstain from intercourse during outbreak, also advised to wear condoms if she does have intercourse.  Given now current pregnancy, will require suppression at 36 weeks.  Bacterial vaginosis Present on wet prep.  Will send Flagyl x7 days to pharmacy.  Pregnancy Confirmed on UPreg today.  Unknown LMP, will obtain transvaginal ultrasound to assess  dating.  Will require referral to OB/GYN given advanced maternal age, referral placed.  Did obtain genetic counseling with last pregnancy.  Obtained initial OB labs today.  Discussed safe medications in pregnancy and medication list updated, see AVS for details.  Prescription sent for prenatal vitamins.  Orders Placed This Encounter  Procedures  . Culture, OB Urine  . 05/20/2019 OB Transvaginal    Standing Status:   Future    Standing Expiration Date:   08/14/2020    Order Specific Question:   Reason for Exam (SYMPTOM  OR DIAGNOSIS REQUIRED)    Answer:   dating 08/16/2020    Order Specific Question:   Preferred Imaging Location?    Answer:   [facility]  . Obstetric Panel, Including HIV([facility])  . HGB FRAC. W/SOLUBILITY  . Ambulatory referral to Obstetrics / Gynecology    Referral Priority:   Routine    Referral Type:   Consultation    Referral Reason:   Specialty Services Required    Requested Specialty:   Obstetrics and Gynecology    Number of Visits Requested:   1  . POCT urine pregnancy  . POCT Wet Prep [facility])   Meds ordered this encounter  Medications  . fluticasone (FLONASE) 50 MCG/ACT nasal spray    Sig: Place 1 spray into both nostrils daily.    Dispense:  16 g    Refill:  3  . metroNIDAZOLE (FLAGYL) 500 MG tablet    Sig: Take 1 tablet (500 mg total) by  mouth 2 (two) times daily for 7 days.    Dispense:  14 tablet    Refill:  0  . Prenatal 27-1 MG TABS    Sig: Take 1 tablet by mouth daily.    Dispense:  90 tablet    Refill:  3  . valACYclovir (VALTREX) 1000 MG tablet    Sig: Take 1 tablet (1,000 mg total) by mouth daily for 5 days.    Dispense:  5 tablet    Refill:  0    Rory Percy, DO PGY-3, [place] [facility] 06/15/2019 3:49 PM

## 2019-06-15 NOTE — Assessment & Plan Note (Addendum)
Confirmed on UPreg today.  Unknown LMP, will obtain transvaginal ultrasound to assess dating.  Will require referral to OB/GYN given advanced maternal age, referral placed.  Did obtain genetic counseling with last pregnancy.  Obtained initial OB labs today.  Discussed safe medications in pregnancy and medication list updated, see AVS for details.  Prescription sent for prenatal vitamins.

## 2019-06-15 NOTE — Assessment & Plan Note (Signed)
Present on wet prep.  Will send Flagyl x7 days to pharmacy.

## 2019-06-15 NOTE — Patient Instructions (Addendum)
It was great to see you!  Our plans for today:  - You are pregnant! We are referring you to OB/GYN given you are high risk. - You have Bacterial Vaginosis, take the antibiotic twice daily for 7 days. - I am also sending in Valtrex for you to take for your herpes outbreak. It is recommended to abstain from sex during your outbreak but make sure to wear a condom if you do have sex to prevent transmission. - We are checking some labs today, we will call you or send you a letter if they are abnormal.  - Continue to take the zyrtec. I will send in flonase to help with your symptoms as well.   Take care and seek immediate care sooner if you develop any concerns.   Dr. [facility]  SAFE MEDICATIONS IN PREGNANCY  Acne:  Benzoyl Peroxide  Salicylic Acid   Backache/Headache:  Tylenol: 2 regular strength every 4 hours OR        2 Extra strength every 6 hours   Colds/Coughs/Allergies:  Benadryl (alcohol free) 25 mg every 6 hours as needed  Breath right strips  Claritin  Cepacol throat lozenges  Chloraseptic throat spray  Cold-Eeze- up to three times per day  Cough drops, alcohol free  Flonase (by prescription only)  Guaifenesin  Mucinex  Robitussin DM (plain only, alcohol free)  Saline nasal spray/drops  Sudafed (pseudoephedrine) & Actifed * use only after [redacted] weeks gestation and if you do not have high blood pressure  Tylenol  Vicks Vaporub  Zinc lozenges  Zyrtec   Constipation:  Colace  Ducolax suppositories  Fleet enema  Glycerin suppositories  Metamucil  Milk of magnesia  Miralax  Senokot  Smooth move tea   Diarrhea:  Kaopectate  Imodium A-D   *NO pepto Bismol   Hemorrhoids:  Anusol  Anusol HC  Preparation H  Tucks   Indigestion:  Tums  Maalox  Mylanta  Zantac  Pepcid   Insomnia:  Benadryl (alcohol free) 25mg  every 6 hours as needed  Tylenol PM  Unisom, no Gelcaps   Leg Cramps:  Tums  MagGel   Nausea/Vomiting:  Bonine   Dramamine  Emetrol  Ginger extract  Sea bands  Meclizine  Nausea medication to take during pregnancy:  Unisom (doxylamine succinate 25 mg tablets) Take one tablet daily at bedtime. If symptoms are not adequately controlled, the dose can be increased to a maximum recommended dose of two tablets daily (1/2 tablet in the morning, 1/2 tablet mid-afternoon and one at bedtime).  Vitamin B6 100mg  tablets. Take one tablet twice a day (up to 200 mg per day).   Skin Rashes:  Aveeno products  Benadryl cream or 25mg  every 6 hours as needed  Calamine Lotion  1% cortisone cream   Yeast infection:  Gyne-lotrimin 7  Monistat 7    **If taking multiple medications, please check labels to avoid duplicating the same active ingredients  **take medication as directed on the label  ** Do not exceed 4000 mg of tylenol in 24 hours  **Do not take medications that contain aspirin or ibuprofen

## 2019-06-16 LAB — CERVICOVAGINAL ANCILLARY ONLY
Chlamydia: NEGATIVE
Comment: NEGATIVE
Comment: NEGATIVE
Comment: NORMAL
Neisseria Gonorrhea: NEGATIVE
Trichomonas: NEGATIVE

## 2019-06-17 LAB — OBSTETRIC PANEL, INCLUDING HIV
Antibody Screen: NEGATIVE
Basophils Absolute: 0 10*3/uL (ref 0.0–0.2)
Basos: 0 %
EOS (ABSOLUTE): 0.2 10*3/uL (ref 0.0–0.4)
Eos: 2 %
HIV Screen 4th Generation wRfx: NONREACTIVE
Hematocrit: 33.2 % — ABNORMAL LOW (ref 34.0–46.6)
Hemoglobin: 10.9 g/dL — ABNORMAL LOW (ref 11.1–15.9)
Hepatitis B Surface Ag: NEGATIVE
Immature Grans (Abs): 0 10*3/uL (ref 0.0–0.1)
Immature Granulocytes: 0 %
Lymphocytes Absolute: 2.1 10*3/uL (ref 0.7–3.1)
Lymphs: 20 %
MCH: 26.2 pg — ABNORMAL LOW (ref 26.6–33.0)
MCHC: 32.8 g/dL (ref 31.5–35.7)
MCV: 80 fL (ref 79–97)
Monocytes Absolute: 0.4 10*3/uL (ref 0.1–0.9)
Monocytes: 4 %
Neutrophils Absolute: 7.4 10*3/uL — ABNORMAL HIGH (ref 1.4–7.0)
Neutrophils: 74 %
Platelets: 254 10*3/uL (ref 150–450)
RBC: 4.16 x10E6/uL (ref 3.77–5.28)
RDW: 12.7 % (ref 11.7–15.4)
RPR Ser Ql: NONREACTIVE
Rh Factor: POSITIVE
Rubella Antibodies, IGG: 3.32 index (ref >0.99)
WBC: 10.1 10*3/uL (ref 3.4–10.8)

## 2019-06-17 LAB — HGB FRAC. W/SOLUBILITY
Hgb A2 Quant: 2.1 % (ref 1.8–3.2)
Hgb A: 97.9 % (ref 96.4–98.8)
Hgb C: 0 %
Hgb F Quant: 0 % (ref 0.0–2.0)
Hgb S: 0 %
Hgb Solubility: NEGATIVE
Hgb Variant: 0 %

## 2019-06-17 LAB — URINE CULTURE, OB REFLEX: Organism ID, Bacteria: NO GROWTH

## 2019-06-17 LAB — CULTURE, OB URINE

## 2019-06-23 ENCOUNTER — Telehealth: Payer: Self-pay | Admitting: Obstetrics and Gynecology

## 2019-06-23 ENCOUNTER — Ambulatory Visit: Payer: Medicaid Other

## 2019-06-23 ENCOUNTER — Telehealth: Payer: Self-pay | Admitting: General Practice

## 2019-06-23 NOTE — Telephone Encounter (Signed)
The patient called in stating she missed a call from a nurse Morey Hummingbird) and would like to speak with her. She also stated she confirmed pregnancy with her doctor and does not need the appointment for today. The patient would like a call back.

## 2019-06-23 NOTE — Telephone Encounter (Signed)
Per chart review, patient has appt today in office for UPT. Patient had visit with PCP last week in which pregnancy was confirmed. Called patient to discuss appt today was not needed- no answer. Left message to call back.

## 2019-06-23 NOTE — Telephone Encounter (Signed)
The patient stated she was returning a missed call from nurse Morey Hummingbird) would like a call back. Also stated she confirmed pregnancy with her doctor.

## 2019-06-23 NOTE — Telephone Encounter (Signed)
Called patient back & discussed today's appt. Patient verbalized understanding and states the appt was made prior to her PCP visit. She states she called and cancelled the appt earlier this morning. Discussed ultrasound appt tomorrow & we would see her afterwards for results. Patient verbalized understanding & asked about COVID restrictions and visitors. Discussed unfortunately at this time visitors are not allowed to appts and she will need to wear a mask throughout the visit. Patient verbalized understanding & states she has been experiencing some lightheadedness and dizziness recently and that's why she was asking about visitors. Reassured patient we would provide any assistance needed and that some of her future prenatal visits will be virtual as well. Patient verbalized understanding & had no other questions.

## 2019-06-24 ENCOUNTER — Telehealth: Payer: Self-pay | Admitting: Family Medicine

## 2019-06-24 ENCOUNTER — Other Ambulatory Visit: Payer: Self-pay

## 2019-06-24 ENCOUNTER — Ambulatory Visit (HOSPITAL_COMMUNITY)
Admission: RE | Admit: 2019-06-24 | Discharge: 2019-06-24 | Disposition: A | Discharge status: Home / Self Care | Payer: Medicaid Other | Source: Ambulatory Visit | Attending: Family Medicine | Admitting: Family Medicine

## 2019-06-24 ENCOUNTER — Encounter: Payer: Self-pay | Admitting: Family Medicine

## 2019-06-24 ENCOUNTER — Other Ambulatory Visit: Payer: Self-pay | Admitting: Family Medicine

## 2019-06-24 ENCOUNTER — Encounter (HOSPITAL_COMMUNITY): Payer: Self-pay

## 2019-06-24 ENCOUNTER — Ambulatory Visit (HOSPITAL_BASED_OUTPATIENT_CLINIC_OR_DEPARTMENT_OTHER)
Admission: RE | Admit: 2019-06-24 | Discharge: 2019-06-24 | Disposition: A | Discharge status: Home / Self Care | Payer: Medicaid Other | Source: Ambulatory Visit | Attending: Family Medicine | Admitting: Family Medicine

## 2019-06-24 DIAGNOSIS — Z3201 Encounter for pregnancy test, result positive: Secondary | ICD-10-CM | POA: Diagnosis not present

## 2019-06-24 DIAGNOSIS — Z3A14 14 weeks gestation of pregnancy: Secondary | ICD-10-CM

## 2019-06-24 DIAGNOSIS — O09212 Supervision of pregnancy with history of pre-term labor, second trimester: Secondary | ICD-10-CM

## 2019-06-24 DIAGNOSIS — O09522 Supervision of elderly multigravida, second trimester: Secondary | ICD-10-CM | POA: Diagnosis not present

## 2019-06-24 DIAGNOSIS — O09292 Supervision of pregnancy with other poor reproductive or obstetric history, second trimester: Secondary | ICD-10-CM | POA: Diagnosis not present

## 2019-06-24 NOTE — Telephone Encounter (Signed)
Called patient to discuss u/s results and to congratulate her on her unexpected pregnancy. Shows gestational age of [redacted]w[redacted]d - further along than expected. Has new OB visit scheduled for early January in high risk clinic - will see if we can get this moved up at all given her gestational age. Patient high risk due to [facility] and history of IUFD. MFM also recommended NIPS given AMA, but we don't perform that test here at the [facility]. Will message [facility] about these questions. Patient confirms she is taking PNV.  Patient appreciative of the message Leeanne Rio, MD

## 2019-06-25 NOTE — Telephone Encounter (Signed)
She can come in for a lab only visit soon for NIPS.

## 2019-06-26 ENCOUNTER — Other Ambulatory Visit (HOSPITAL_COMMUNITY): Payer: Self-pay | Admitting: *Deleted

## 2019-06-26 DIAGNOSIS — Z362 Encounter for other antenatal screening follow-up: Secondary | ICD-10-CM

## 2019-07-06 ENCOUNTER — Telehealth: Payer: Self-pay | Admitting: Family Medicine

## 2019-07-06 NOTE — Telephone Encounter (Signed)
Patient calling for 2 reasons:   1. Patient seen on 11-23 and was prescribed Flagyl. She says she has completed that dose but still is having irritation. She believes this has happened in the past and something else ended up having to be called in.   2. Patient also wondering if she is supposed to see Dr. Ardelia Mems at all for her OB care since she is high risk.

## 2019-07-07 MED ORDER — MICONAZOLE NITRATE 2 % VA CREA: 1.0000 | TOPICAL_CREAM | Freq: Every day | VAGINAL | 0 refills | Status: DC

## 2019-07-07 NOTE — Telephone Encounter (Signed)
Returned call to patient. She is having irritation after taking flagyl for bv. Suspect yeast infection. Will send miconazole vaginal cream.  Also advised she will need to get routine prenatal care at [facility] clinic. I am happy to see her for non pregnancy related complaints as needed during her pregnancy, and we can certainly see her for any OBGYN issues until she gets established with [facility] (has intake appointment there 12/29).  Patient appreciative Leeanne Rio, MD

## 2019-07-08 ENCOUNTER — Telehealth: Payer: Self-pay | Admitting: *Deleted

## 2019-07-08 MED ORDER — CLOTRIMAZOLE 1 % VA CREA: 1.0000 | TOPICAL_CREAM | Freq: Every day | VAGINAL | 0 refills | Status: DC

## 2019-07-08 NOTE — Telephone Encounter (Signed)
Sent in rx for clotrimazole Leeanne Rio, MD

## 2019-07-08 NOTE — Telephone Encounter (Signed)
Miconazole cream not covered by Medicaid.  Please see below of formulary.  Let "RN Team" know if you are changing to covered medications or would like to pursue a PA. Christen Bame, CMA

## 2019-07-21 ENCOUNTER — Other Ambulatory Visit: Payer: Self-pay

## 2019-07-21 ENCOUNTER — Telehealth: Payer: Self-pay | Admitting: Family Medicine

## 2019-07-21 ENCOUNTER — Ambulatory Visit (INDEPENDENT_AMBULATORY_CARE_PROVIDER_SITE_OTHER): Payer: Medicaid Other | Admitting: *Deleted

## 2019-07-21 DIAGNOSIS — O0990 Supervision of high risk pregnancy, unspecified, unspecified trimester: Secondary | ICD-10-CM

## 2019-07-21 DIAGNOSIS — A6000 Herpesviral infection of urogenital system, unspecified: Secondary | ICD-10-CM

## 2019-07-21 DIAGNOSIS — F329 Major depressive disorder, single episode, unspecified: Secondary | ICD-10-CM

## 2019-07-21 DIAGNOSIS — Z8759 Personal history of other complications of pregnancy, childbirth and the puerperium: Secondary | ICD-10-CM | POA: Insufficient documentation

## 2019-07-21 DIAGNOSIS — O09529 Supervision of elderly multigravida, unspecified trimester: Secondary | ICD-10-CM

## 2019-07-21 DIAGNOSIS — O09899 Supervision of other high risk pregnancies, unspecified trimester: Secondary | ICD-10-CM

## 2019-07-21 DIAGNOSIS — F411 Generalized anxiety disorder: Secondary | ICD-10-CM

## 2019-07-21 DIAGNOSIS — F32A Depression, unspecified: Secondary | ICD-10-CM

## 2019-07-21 DIAGNOSIS — F1911 Other psychoactive substance abuse, in remission: Secondary | ICD-10-CM | POA: Insufficient documentation

## 2019-07-21 DIAGNOSIS — F419 Anxiety disorder, unspecified: Secondary | ICD-10-CM

## 2019-07-21 HISTORY — DX: Supervision of high risk pregnancy, unspecified, unspecified trimester: O09.90

## 2019-07-21 MED ORDER — BLOOD PRESSURE KIT DEVI: 1.0000 | 0 refills | Status: DC | PRN

## 2019-07-21 NOTE — Progress Notes (Signed)
Chart reviewed - agree with RN documentation.   

## 2019-07-21 NOTE — Patient Instructions (Addendum)
At [facility] [employer], we work as an integrated team, providing care to address both physical and emotional health. Your medical provider may refer you to see our Behavioral Health Clinician [facility]) on the same day you see your medical provider, as availability permits.  Our [facility] is available to all patients, visits generally last between 20-30 minutes, but can be longer or shorter, depending on patient need. The [facility] offers help with stress management, coping with symptoms of depression and anxiety, major life changes , sleep issues, changing risky behavior, grief and loss, life stress, working on personal life goals, and  behavioral health issues, as these all affect your overall health and wellness.  The [facility] is NOT available for the following: court-ordered evaluations, specialty assessments (custody or disability), letters to employers, or obtaining certification for an emotional support animal. The [facility] does not provide long-term therapeutic services. You have the right to refuse integrated behavioral health services, or to reschedule to see the [facility] at a later date.  Exception: If you are having thoughts of suicide, we require that you either see the [facility] for further assessment, or contract for safety with your medical provider prior to checking out.  Confidentiality exception: If it is suspected that a child or disabled adult is being abused or neglected, we are required by law to report that to either Child Protective Services or Adult Management consultant.  If you have a diagnosis of Bipolar affective disorder, Schizophrenia, or recurrent Major depressive disorder, we will recommend that you establish care with a psychiatrist, as these are lifelong, chronic conditions, and we want your overall emotional health and medications to be more closely monitored. If you anticipate needing extended maternity leave due to mental illness, it it recommended that you find a psychiatrist as soon as possible.  Neither the medical provider, nor the [facility], can recommend an extended maternity leave for mental health issues. Your medical provider or [facility] may refer you to a therapist for ongoing, traditional therapy, or to a psychiatrist, for medication management, if it would benefit your overall health. Depending on your insurance, you may have a copay to see the [facility]. If you are uninsured, it is recommended that you apply for financial assistance. (Forms may be requested at the front desk for in-person visits, via MyChart, or request a form during a virtual visit).  If you see the [facility] more than 6 times, you will have to complete a comprehensive clinical assessment interview with the [facility] to resume integrated services.  Any questions?   Transportation Resources [place] [employer] (GTA) [address] [place], [place], [place] [address] https://www.[place].gov/departments/transportation/gdot-divisions/[place]-transit-agency-public-transportation-division     . Fixed-route bus services, including regional fare cards for PART, [place], [place], and [place] buses.  . Reduced fare bus ID's available for Medicaid, Medicare, and "orange card" recipients.  [employer] offers curb-to-curb and door-to-door bus services for people with disabilities who are unable to use a fixed-bus route; also offers a shared-ride program.   Helpful tips:  -Routes available online and physical maps available at the main bus hub lobby (each for a specific route) -Smartphone directions often include bus routes (see the "bus" icon, next to the "car" and "walk" icons) -Routes differ on weekends, evenings and holidays, so plan ahead!  -If you have Medicaid, Medicare, or orange card, plan to obtain a reduced-fare ID to save 50% on rides. Check days and times to obtain an ID, and bring all necessary documents.   High  [employer] [place]) [place], [place]. [address] [place], [place] [address] [phone] http://www.smith-bell.[email or web address]/ **Fixed-bus route services, and demand response bus service for older adults  Department of Social [facility] [address] [place], [place] [address] [phone] www.ProfileWatcher.fi  **Medicaid transportation is available to [facility] recipients who need assistance getting to [facility] medical appointments and providers  [facility] [address] [place] [address], [place], [place] [address] www.[email or web address]  ** Offers non-emergency transportation for medical appointments  [employer] [place], [place], [place] [address] [phone] www.wheels4hope.org **REFERRAL NEEDED by specific agencies (see website), after meeting specified criteria only  [employer]) [address], [place], [place] [address] [phone]  BikerFestival.is  *Regional fixed-bus routes between counties (example: [place] to [place]) and [employer] of [place], [place], [place] [address] [phone] Http://senior-resources-[place].org  Production assistant, radio available (call or see website for details)  [place] [address] [place],  [address] [phone] www.[email or web address]  **Ride coordination

## 2019-07-21 NOTE — Progress Notes (Signed)
I connected with  Yesenia Weeks on 07/21/19 at  9:30 AM EST by telephone and verified that I am speaking with the correct person using two identifiers.   I discussed the limitations, risks, security and privacy concerns of performing an evaluation and management service by telephone and the availability of in person appointments. I also discussed with the patient that there may be a patient responsible charge related to this service. The patient expressed understanding and agreed to proceed. Explained I am completing her New OB Intake today. We discussed Her EDD and that it is based on  Early [place] . I reviewed her allergies, meds, OB History, Medical /Surgical history, and appropriate screenings. She does have a history of anxiety and depression, as well as + PHQ9 and GAD 7 today. I explained we have [facility] services and offered her an appointment with [place]. I explained registrar will schedule and contact her with appointment.  Patient and/or legal guardian verbally consented to [facility] services about presenting concerns and psychiatric consultation as appropriate. I also informed her I will put transportation resources into her visit notes .    I explained I will send her the [employer] app- app sent to her while on phone.  I explained we will send a blood pressure cuff to [facility] that will [facility] that prescription and they  will call her to verify her information. I asked her to bring the blood pressure cuff with her to her next  ob appointment so we can show her how to use it or call for a nurse visit. Explained  then we will have her take her blood pressure weekly and enter into the app. Explained she will have some visits in office and some virtually. I sent her a text  She already has MyChart app. I reviewed her new ob  appointment date/ time with her , our location and to wear mask, no visitors.  I explained she will have a pelvic exam, ob bloodwork, hemoglobin a1C, cbg ,  genetic testing if desired,- she does want a panorama.. She already had a anatomy US and has a fu [place] scheduled to complete anatomy. I reviewed that  appointment. She voices understanding.   [name],RN 07/21/2019  9:39 AM

## 2019-07-21 NOTE — Telephone Encounter (Signed)
Pt called and spoke with front office staff. Called pt to follow up. Pt reports she experienced some minimal pink spotting last night. Pt reports having intercourse a few days ago, nothing in the vagina since that time. No spotting this morning. Pt states she is having some pain in her lower abdomen and pressure that improves with rest. Pt states she has some discomfort in her upper abdomen after eating. Discussed possibility of round ligament pain and heartburn with patient. Recommended pt try taking tylenol for the pain as well as resting. Patient encouraged to keep new OB intake today with Vaughan Basta, RN and to call if her symptoms should change. Explained to pt she should go to the MAU if she begins bleeding like a period or having severe pain. Pt verbalizes understanding.

## 2019-07-21 NOTE — Telephone Encounter (Signed)
Received a call from [name] stating she needed to come in because she was having some spotting at 17 weeks. Message sent to [facility], and [employer].

## 2019-07-22 ENCOUNTER — Encounter: Payer: Self-pay | Admitting: *Deleted

## 2019-07-22 ENCOUNTER — Telehealth: Payer: Self-pay | Admitting: General Practice

## 2019-07-22 ENCOUNTER — Encounter: Payer: Medicaid Other | Admitting: Family Medicine

## 2019-07-22 NOTE — Telephone Encounter (Signed)
Patient called and left message on nurse voicemail line stating she couldn't make her appt this morning but is calling because she's been having discomfort and pain. She wants to know if there is a walk in option.   Called patient stating I am returning her phone call. Patient reports discomfort/pain in her pelvis for several weeks now even before she found out she was pregnant. Patient states she guesses she is a little worried/concerned because she is older and due to her history. She states "I'm sure I will feel better when I hear the baby's heartbeat." eassured patient that some aches/discomforts can be normal in pregnancy especially with the more babies she has. Patient verbalized understanding & states her next appt is on 1/11 but is with a man and she prefers a female. Encouraged patient to call the front office to ask for a switch. Told her to let them know she is almost 20 weeks. Patient verbalized understanding & had no other questions.

## 2019-07-22 NOTE — BH Specialist Note (Deleted)
Integrated Behavioral Health via Telemedicine Video Visit  07/22/2019 Nahima Clinard [id number]  Number of [place] visits: 1 (3 total) Session Start time: 10:45***  Session End time: 11:45*** Total time: {IBH Total VZDG:38756433}  Referring Provider: Loma Boston, DO Type of Visit: Video Patient/Family location: Home [facility] Provider location: [facility] All persons participating in visit: Patient *** and [place] ***   Confirmed patient's address: Yes  Confirmed patient's phone number: Yes  Any changes to demographics: No   Confirmed patient's insurance: Yes  Any changes to patient's insurance: No   Discussed confidentiality: Yes ***  I connected with Stpehanie Glynn  by a video enabled telemedicine application and verified that I am speaking with the correct person using two identifiers.     I discussed the limitations of evaluation and management by telemedicine and the availability of in person appointments.  I discussed that the purpose of this visit is to provide behavioral health care while limiting exposure to the novel coronavirus.   Discussed there is a possibility of technology failure and discussed alternative modes of communication if that failure occurs.  I discussed that engaging in this video visit, they consent to the provision of behavioral healthcare and the services will be billed under their insurance.  Patient and/or legal guardian expressed understanding and consented to video visit: Yes   PRESENTING CONCERNS: Patient and/or family reports the following symptoms/concerns: *** Duration of problem: ***; Severity of problem: {Mild/Moderate/Severe:20260}  STRENGTHS (Protective Factors/Coping Skills): ***  GOALS ADDRESSED: Patient will: 1.  Reduce symptoms of: {IBH Symptoms:21014056}  2.  Increase knowledge and/or ability of: {IBH Patient Tools:21014057}  3.  Demonstrate ability to: {IBH  Goals:21014053}  INTERVENTIONS: Interventions utilized:  {IBH Interventions:21014054} Standardized Assessments completed: {IBH Screening Tools:21014051}  ASSESSMENT: Patient currently experiencing ***.   Patient may benefit from psychoeducation and brief therapeutic interventions regarding coping with symptoms of *** .  PLAN: 1. Follow up with behavioral health clinician on : *** 2. Behavioral recommendations:  -*** -*** 3. Referral(s): {IBH Referrals:21014055}  I discussed the assessment and treatment plan with the patient and/or parent/guardian. They were provided an opportunity to ask questions and all were answered. They agreed with the plan and demonstrated an understanding of the instructions.   They were advised to call back or seek an in-person evaluation if the symptoms worsen or if the condition fails to improve as anticipated.  Caroleen Hamman Nadim Malia

## 2019-07-28 ENCOUNTER — Encounter: Payer: Medicaid Other | Admitting: Obstetrics and Gynecology

## 2019-07-29 ENCOUNTER — Ambulatory Visit (HOSPITAL_COMMUNITY): Payer: Medicaid Other

## 2019-07-29 ENCOUNTER — Other Ambulatory Visit: Payer: Self-pay

## 2019-07-29 ENCOUNTER — Encounter (HOSPITAL_COMMUNITY): Payer: Self-pay

## 2019-07-29 ENCOUNTER — Ambulatory Visit (HOSPITAL_COMMUNITY)
Admission: RE | Admit: 2019-07-29 | Discharge: 2019-07-29 | Disposition: A | Discharge status: Home / Self Care | Payer: Medicaid Other | Source: Ambulatory Visit | Attending: Obstetrics and Gynecology | Admitting: Obstetrics and Gynecology

## 2019-07-29 ENCOUNTER — Ambulatory Visit (HOSPITAL_COMMUNITY): Payer: Medicaid Other | Admitting: *Deleted

## 2019-07-29 ENCOUNTER — Ambulatory Visit (INDEPENDENT_AMBULATORY_CARE_PROVIDER_SITE_OTHER): Payer: Medicaid Other

## 2019-07-29 ENCOUNTER — Other Ambulatory Visit (HOSPITAL_COMMUNITY)
Admission: RE | Admit: 2019-07-29 | Discharge: 2019-07-29 | Disposition: A | Discharge status: Home / Self Care | Payer: Medicaid Other | Source: Ambulatory Visit | Attending: Family Medicine | Admitting: Family Medicine

## 2019-07-29 DIAGNOSIS — F1911 Other psychoactive substance abuse, in remission: Secondary | ICD-10-CM | POA: Diagnosis present

## 2019-07-29 DIAGNOSIS — O0990 Supervision of high risk pregnancy, unspecified, unspecified trimester: Secondary | ICD-10-CM | POA: Diagnosis present

## 2019-07-29 DIAGNOSIS — F419 Anxiety disorder, unspecified: Secondary | ICD-10-CM | POA: Insufficient documentation

## 2019-07-29 DIAGNOSIS — O09899 Supervision of other high risk pregnancies, unspecified trimester: Secondary | ICD-10-CM | POA: Diagnosis present

## 2019-07-29 DIAGNOSIS — Z3A19 19 weeks gestation of pregnancy: Secondary | ICD-10-CM

## 2019-07-29 DIAGNOSIS — O09522 Supervision of elderly multigravida, second trimester: Secondary | ICD-10-CM | POA: Insufficient documentation

## 2019-07-29 DIAGNOSIS — Z1379 Encounter for other screening for genetic and chromosomal anomalies: Secondary | ICD-10-CM | POA: Diagnosis present

## 2019-07-29 DIAGNOSIS — N760 Acute vaginitis: Secondary | ICD-10-CM

## 2019-07-29 DIAGNOSIS — Z8759 Personal history of other complications of pregnancy, childbirth and the puerperium: Secondary | ICD-10-CM | POA: Diagnosis present

## 2019-07-29 DIAGNOSIS — N898 Other specified noninflammatory disorders of vagina: Secondary | ICD-10-CM | POA: Insufficient documentation

## 2019-07-29 DIAGNOSIS — Z362 Encounter for other antenatal screening follow-up: Secondary | ICD-10-CM | POA: Diagnosis not present

## 2019-07-29 DIAGNOSIS — O09212 Supervision of pregnancy with history of pre-term labor, second trimester: Secondary | ICD-10-CM

## 2019-07-29 DIAGNOSIS — O09292 Supervision of pregnancy with other poor reproductive or obstetric history, second trimester: Secondary | ICD-10-CM | POA: Diagnosis not present

## 2019-07-29 DIAGNOSIS — A6000 Herpesviral infection of urogenital system, unspecified: Secondary | ICD-10-CM

## 2019-07-29 DIAGNOSIS — B9689 Other specified bacterial agents as the cause of diseases classified elsewhere: Secondary | ICD-10-CM

## 2019-07-29 MED ORDER — METRONIDAZOLE 500 MG PO TABS: 500.0000 mg | ORAL_TABLET | Freq: Two times a day (BID) | ORAL | 0 refills | Status: DC

## 2019-07-29 MED ORDER — TERCONAZOLE 0.4 % VA CREA: 1.0000 | TOPICAL_CREAM | Freq: Every day | VAGINAL | 0 refills | Status: DC

## 2019-07-29 MED ORDER — VALACYCLOVIR HCL 1 G PO TABS: 1000.0000 mg | ORAL_TABLET | Freq: Every day | ORAL | 2 refills | Status: DC

## 2019-07-29 NOTE — Progress Notes (Signed)
Pt here today with complaint of vaginal itching and recent untreated BV during pregnancy. Wet prep positive for BV on 06/15/19. Pt states she was unable to take prescribed medications because they were not covered by medicaid. Pt reports vaginal itching, odor, and brown discharge today. Discussed possibility of spotting during pregnancy; pt advised to go to the MAU if she has bleeding like a period. Also complains of itching to general groin/inner thigh area; requested that pt follow up with provider at [facility] appt about skin irritation. Self-swab instructions given and specimen obtained. Flagyl and Terazol 7 rx sent to preferred pharmacy per protocol. Pt instructed to complete Flagyl prior to using the vaginal gel. Explained to pt we will call with any abnormal results. Pt states she has a hx of HSV and does not have any more refills available at pharmacy. Pt denies current outbreak. Valtrex rx ordered per Debroah Loop, MD and pt notified. Pt will follow up at new OB appt on 08/03/19.  Fleet Contras RN 07/29/19

## 2019-07-30 ENCOUNTER — Other Ambulatory Visit (HOSPITAL_COMMUNITY): Payer: Self-pay | Admitting: *Deleted

## 2019-07-30 ENCOUNTER — Telehealth (INDEPENDENT_AMBULATORY_CARE_PROVIDER_SITE_OTHER): Payer: Medicaid Other | Admitting: Obstetrics & Gynecology

## 2019-07-30 DIAGNOSIS — O09292 Supervision of pregnancy with other poor reproductive or obstetric history, second trimester: Secondary | ICD-10-CM

## 2019-07-30 DIAGNOSIS — O0990 Supervision of high risk pregnancy, unspecified, unspecified trimester: Secondary | ICD-10-CM

## 2019-07-30 LAB — CERVICOVAGINAL ANCILLARY ONLY
Bacterial Vaginitis (gardnerella): NEGATIVE
Candida Glabrata: NEGATIVE
Candida Vaginitis: POSITIVE — AB
Chlamydia: NEGATIVE
Comment: NEGATIVE
Comment: NEGATIVE
Comment: NEGATIVE
Comment: NEGATIVE
Comment: NEGATIVE
Comment: NORMAL
Neisseria Gonorrhea: NEGATIVE
Trichomonas: NEGATIVE

## 2019-07-30 NOTE — Telephone Encounter (Signed)
The patient called in with questions about a blood pressure kit and information she received in the medication section of her mychart. Would like a call back to discuss.

## 2019-07-30 NOTE — Telephone Encounter (Signed)
Returned patients call.   Pt reports she saw a message about a BP kit but has not received it. Pt has not spoken with [facility]. Gave pt [facility] number to call and work out delivery. Pt voiced understanding.

## 2019-07-31 ENCOUNTER — Institutional Professional Consult (permissible substitution): Payer: Self-pay

## 2019-07-31 ENCOUNTER — Other Ambulatory Visit: Payer: Self-pay

## 2019-08-03 ENCOUNTER — Other Ambulatory Visit (HOSPITAL_COMMUNITY)
Admission: RE | Admit: 2019-08-03 | Discharge: 2019-08-03 | Disposition: A | Discharge status: Home / Self Care | Payer: Medicaid Other | Source: Ambulatory Visit | Attending: Obstetrics and Gynecology | Admitting: Obstetrics and Gynecology

## 2019-08-03 ENCOUNTER — Encounter: Payer: Self-pay | Admitting: Family Medicine

## 2019-08-03 ENCOUNTER — Other Ambulatory Visit: Payer: Self-pay

## 2019-08-03 ENCOUNTER — Ambulatory Visit (INDEPENDENT_AMBULATORY_CARE_PROVIDER_SITE_OTHER): Payer: Medicaid Other | Admitting: Family Medicine

## 2019-08-03 VITALS — Wt 190.6 lb

## 2019-08-03 DIAGNOSIS — Z8759 Personal history of other complications of pregnancy, childbirth and the puerperium: Secondary | ICD-10-CM

## 2019-08-03 DIAGNOSIS — O09522 Supervision of elderly multigravida, second trimester: Secondary | ICD-10-CM

## 2019-08-03 DIAGNOSIS — O0992 Supervision of high risk pregnancy, unspecified, second trimester: Secondary | ICD-10-CM

## 2019-08-03 DIAGNOSIS — A6000 Herpesviral infection of urogenital system, unspecified: Secondary | ICD-10-CM

## 2019-08-03 DIAGNOSIS — O0932 Supervision of pregnancy with insufficient antenatal care, second trimester: Secondary | ICD-10-CM

## 2019-08-03 DIAGNOSIS — F411 Generalized anxiety disorder: Secondary | ICD-10-CM

## 2019-08-03 DIAGNOSIS — Z3A20 20 weeks gestation of pregnancy: Secondary | ICD-10-CM | POA: Diagnosis not present

## 2019-08-03 DIAGNOSIS — O0990 Supervision of high risk pregnancy, unspecified, unspecified trimester: Secondary | ICD-10-CM | POA: Diagnosis not present

## 2019-08-03 MED ORDER — VALACYCLOVIR HCL 500 MG PO TABS: 500.0000 mg | ORAL_TABLET | Freq: Two times a day (BID) | ORAL | 3 refills | Status: DC

## 2019-08-03 MED ORDER — ASPIRIN EC 81 MG PO TBEC: 81.0000 mg | DELAYED_RELEASE_TABLET | Freq: Every day | ORAL | 2 refills | Status: DC

## 2019-08-03 NOTE — Progress Notes (Signed)
Subjective:  Yesenia Weeks is a [id number] [redacted]w[redacted]d being seen today for her first obstetrical visit.  Her obstetrical history is significant for late to care. Had a 21 week IUFD with preterm delivery. Subsequent 3 pregnancies were vaginal deliveries with SGA babies. No problems with blood pressure. Does have a history of HSV. FOB is patient's spouse.. Patient does intend to breast feed. Pregnancy history fully reviewed.  Patient reports no complaints.  Wt 190 lb 9.6 oz (86.5 kg)   LMP 05/18/2019 (Within Days)   BMI 29.85 kg/m   HISTORY: OB History  Gravida Para Term Preterm AB Living  5 4 3 1  0 3  SAB TAB Ectopic Multiple Live Births  0     0 3    # Outcome Date GA Lbr Len/2nd Weight Sex Delivery Anes PTL Lv  5 Current           4 Term 02/23/17 [redacted]w[redacted]d 10:30 / 00:17 6 lb 7.7 oz (2.94 kg) M Vag-Spont EPI  LIV     Birth Comments: IUGR  3 Term 02/26/02 [redacted]w[redacted]d  6 lb (2.722 kg) F Vag-Spont EPI Y LIV     Birth Comments: Preterm labor,   2 Term 05/08/98 [redacted]w[redacted]d  6 lb (2.722 kg) F Vag-Spont None Y LIV     Birth Comments: preterm labor,   1 Preterm 1998 [redacted]w[redacted]d     None  FD     Complications: Gonorrhea affecting pregnancy in second trimester, Chlamydia infection during pregnancy    Past Medical History:  Diagnosis Date  . ADHD (attention deficit hyperactivity disorder)   . Allergy   . Anemia    iron def  . Anxiety   . BV (bacterial vaginosis)    recurrent-uses boric acid  . Depression   . Dysmenorrhea   . HSV infection   . Preterm labor   . Substance abuse [facility])     Past Surgical History:  Procedure Laterality Date  . HERNIA REPAIR     x2    Family History  Problem Relation Age of Onset  . Hypertension Mother   . Stroke Mother   . Hypertension Father   . COPD Father   . Cancer Maternal Aunt        breast  . Breast cancer Maternal Aunt   . Diabetes Maternal Grandmother   . Hypertension Maternal Grandmother   . Mental illness Maternal Grandmother   . Cancer Maternal  Grandmother   . Breast cancer Maternal Grandmother   . Diabetes Paternal Grandmother   . Kidney disease Paternal Grandmother   . Mental illness Paternal Grandmother   . Hypertension Paternal Grandmother   . Breast cancer Maternal Aunt      Exam  Wt 190 lb 9.6 oz (86.5 kg)   LMP 05/18/2019 (Within Days)   BMI 29.85 kg/m   Chaperone present during exam  CONSTITUTIONAL: Well-developed, well-nourished female in no acute distress.  HENT:  Normocephalic, atraumatic, External right and left ear normal. Oropharynx is clear and moist EYES: Conjunctivae and EOM are normal. Pupils are equal, round, and reactive to light. No scleral icterus.  NECK: Normal range of motion, supple, no masses.  Normal thyroid.  CARDIOVASCULAR: Normal heart rate noted, regular rhythm RESPIRATORY: Clear to auscultation bilaterally. Effort and breath sounds normal, no problems with respiration noted. ABDOMEN: Soft, normal bowel sounds, no distention noted.  No tenderness, rebound or guarding.  PELVIC: Normal appearing external genitalia; normal appearing vaginal mucosa and cervix. No abnormal discharge noted. Normal uterine size, no other  palpable masses, no uterine or adnexal tenderness. MUSCULOSKELETAL: Normal range of motion. No tenderness.  No cyanosis, clubbing, or edema.  2+ distal pulses. SKIN: Skin is warm and dry. No rash noted. Not diaphoretic. No erythema. No pallor. NEUROLOGIC: Alert and oriented to person, place, and time. Normal reflexes, muscle tone coordination. No cranial nerve deficit noted. PSYCHIATRIC: Normal mood and affect. Normal behavior. Normal judgment and thought content.    Assessment:    Pregnancy: [id number] Patient Active Problem List   Diagnosis Date Noted  . Supervision of high risk pregnancy, antepartum 07/21/2019  . Depression   . AMA (advanced maternal age) multigravida 70+   . History of IUFD   . History of prior pregnancy with SGA newborn   . History of substance abuse ([place])    . Canker sores oral 05/25/2019  . Exposure to COVID-19 virus 02/27/2019  . Bacterial vaginosis 06/13/2018  . Cold sore 06/13/2018  . Tenosynovitis, de Quervain 06/06/2017  . Anxiety state 08/31/2016  . Family history of breast cancer 03/31/2014  . Genital herpes 10/26/2013  . ACNE, MILD 09/10/2008  . ASTEATOTIC ECZEMA 09/10/2008  . HALLUX VALGUS, ACQUIRED 11/06/2006  . ANEMIA, IRON DEFICIENCY, UNSPEC. 09/19/2006  . Mood disorder ([place]) 09/19/2006  . Allergic rhinitis 09/19/2006      Plan:   1. Supervision of high risk pregnancy, antepartum FHT and FH normal PAP done today Mat21 normal. Had normal [place]. Rpt [place] scheduled.  2. Genital herpes simplex, unspecified site - valACYclovir (VALTREX) 500 MG tablet; Take 1 tablet (500 mg total) by mouth 2 (two) times daily.  Dispense: 30 tablet; Refill: 3  3. History of IUFD 21 week. Will need serial [place] for AMA.  4. Anxiety state Controlled at the moment. Has appt with integrative BH  5. Multigravida of advanced maternal age in second trimester Serial [place].  Start ASA 81mg   6. History of prior pregnancy with SGA newborn  7. Insufficient prenatal care in second trimester     Problem list reviewed and updated. 75% of 30 min visit spent on counseling and coordination of care.    Truett Mainland 08/03/2019

## 2019-08-04 LAB — CYTOLOGY - PAP
Comment: NEGATIVE
Diagnosis: NEGATIVE
High risk HPV: NEGATIVE

## 2019-08-05 ENCOUNTER — Telehealth (HOSPITAL_COMMUNITY): Payer: Self-pay | Admitting: Genetic Counselor

## 2019-08-05 ENCOUNTER — Encounter: Payer: Self-pay | Admitting: *Deleted

## 2019-08-05 LAB — MATERNIT21 PLUS CORE+SCA
Fetal Fraction: 9
Monosomy X (Turner Syndrome): NOT DETECTED
Result (T21): NEGATIVE
Trisomy 13 (Patau syndrome): NEGATIVE
Trisomy 18 (Edwards syndrome): NEGATIVE
Trisomy 21 (Down syndrome): NEGATIVE
XXX (Triple X Syndrome): NOT DETECTED
XXY (Klinefelter Syndrome): NOT DETECTED
XYY (Jacobs Syndrome): NOT DETECTED

## 2019-08-05 NOTE — Telephone Encounter (Signed)
I called Ms. Christopher to discuss her negative noninvasive prenatal screening (NIPS)/cell free DNA (cfDNA) testing result. Specifically, Ms. Knouff had MaterniT21 testing through [employer]. Testing was offered because of advanced maternal age. These negative results demonstrated an expected representation of chromosome 21, 18, 13, and sex chromosome material, greatly reducing the likelihood of trisomies 48, 57, or 18 for the pregnancy. The expected fetal sex was confirmed to be female.  NIPS analyzes placental (fetal) DNA in maternal circulation. NIPS is considered to be highly specific and sensitive, but is not considered to be a diagnostic test. We reviewed that this testing identifies 91-99% of pregnancies with trisomies 26, 67, and 44, as well as sex chromosome aneuploidies.  Ms. Kley was reminded that diagnostic testing via amniocentesis is available should she be interested in confirming this result. She confirmed that she had no questions about these results at this time.  Gershon Crane, MS Genetic Counselor

## 2019-08-10 ENCOUNTER — Telehealth: Payer: Self-pay | Admitting: *Deleted

## 2019-08-10 DIAGNOSIS — O0990 Supervision of high risk pregnancy, unspecified, unspecified trimester: Secondary | ICD-10-CM

## 2019-08-10 DIAGNOSIS — N898 Other specified noninflammatory disorders of vagina: Secondary | ICD-10-CM

## 2019-08-10 MED ORDER — TERCONAZOLE 0.4 % VA CREA: 1.0000 | TOPICAL_CREAM | Freq: Every day | VAGINAL | 0 refills | Status: AC

## 2019-08-10 NOTE — BH Specialist Note (Signed)
Integrated Behavioral Health via Telemedicine Video Visit  08/10/2019 Yesenia Weeks [id number]  Number of [place] visits: 1(3 total) Session Start time: 3:29 Session End time: 3:50 Total time: 21  Referring Provider: Loma Boston, DO Type of Visit: Video Patient/Family location: Home [facility] Provider location: [facility] All persons participating in visit: Patient Yesenia Weeks and [place]    Confirmed patient's address: Yes  Confirmed patient's phone number: Yes  Any changes to demographics: No   Confirmed patient's insurance: Yes  Any changes to patient's insurance: No   Discussed confidentiality: Yes   I connected with Arbadella Gunther by a video enabled telemedicine application and verified that I am speaking with the correct person using two identifiers.     I discussed the limitations of evaluation and management by telemedicine and the availability of in person appointments.  I discussed that the purpose of this visit is to provide behavioral health care while limiting exposure to the novel coronavirus.   Discussed there is a possibility of technology failure and discussed alternative modes of communication if that failure occurs.  I discussed that engaging in this video visit, they consent to the provision of behavioral healthcare and the services will be billed under their insurance.  Patient and/or legal guardian expressed understanding and consented to video visit: Yes   PRESENTING CONCERNS: Patient and/or family reports the following symptoms/concerns: Pt states her primary concern today is worry about potential developmental delays in baby, and adjusting to another pregnancy.  Duration of problem: Current pregnancy; Severity of problem: moderate  STRENGTHS (Protective Factors/Coping Skills): Good social support; open to using self-coping strategies  GOALS ADDRESSED: Patient will: 1.  Reduce symptoms of: anxiety and depression  2.  Increase  knowledge and/or ability of: healthy habits  3.  Demonstrate ability to: Increase healthy adjustment to current life circumstances and Increase motivation to adhere to plan of care  INTERVENTIONS: Interventions utilized:  Functional Assessment of ADLs and Psychoeducation and/or Health Education Standardized Assessments completed: GAD-7 and PHQ 9  ASSESSMENT: Patient currently experiencing Adjustment disorder with anxiety and depression.   Patient may benefit from psychoeducation and brief therapeutic interventions regarding coping with symptoms of anxiety and depression .  PLAN: 1. Follow up with behavioral health clinician on : One month 2. Behavioral recommendations:  -Take prenatal vitamin daily -Prioritize healthy meals and healthy sleep -Begin using apps, as discussed, for additional self-care  3. Referral(s): [place] (In Clinic)  I discussed the assessment and treatment plan with the patient and/or parent/guardian. They were provided an opportunity to ask questions and all were answered. They agreed with the plan and demonstrated an understanding of the instructions.   They were advised to call back or seek an in-person evaluation if the symptoms worsen or if the condition fails to improve as anticipated.  Caroleen Hamman [facility] Depression screen [facility] 2/9 08/13/2019 08/03/2019 07/21/2019 06/15/2019 11/25/2018  Decreased Interest 1 2 1  0 0  Down, Depressed, Hopeless 1 1 1  0 -  PHQ - 2 Score 2 3 2  0 0  Altered sleeping 1 1 3  - -  Tired, decreased energy 3 1 3  - -  Change in appetite 1 1 3  - -  Feeling bad or failure about yourself  1 0 0 - -  Trouble concentrating 1 1 1  - -  Moving slowly or fidgety/restless 0 0 0 - -  Suicidal thoughts 0 0 0 - -  PHQ-9 Score 9 7 12  - -  Some recent  data might be hidden   GAD 7 : Generalized Anxiety Score 08/13/2019 08/03/2019 07/21/2019 02/14/2017  Nervous, Anxious, on Edge 1 1 3 1   Control/stop worrying 1 1 3 1   Worry  too much - different things 1 1 3 1   Trouble relaxing 1 1 3  0  Restless 0 0 1 0  Easily annoyed or irritable 3 1 1  0  Afraid - awful might happen 1 1 3 1   Total GAD 7 Score 8 6 17  4

## 2019-08-10 NOTE — Telephone Encounter (Signed)
Yesenia Weeks called and left a message this am that she is having irritation and came in a few weeks ago and was treated for BV and yeast. States she took the medicine but still having irritation. I called [name] and we discussed she took the flagyl first for BV and then the terconazole for 3 nights. States she is still having vaginal irritation. We discussed sometimes 3 nights is not enough to treat yeast so we can repeat the terconazole for 3 nights and if this does not relieve her symptoms to let us know and we may need to retest or treat with other medication. She voices understanding. Linda,RN

## 2019-08-13 ENCOUNTER — Ambulatory Visit (INDEPENDENT_AMBULATORY_CARE_PROVIDER_SITE_OTHER): Payer: Medicaid Other | Admitting: Clinical

## 2019-08-13 ENCOUNTER — Other Ambulatory Visit: Payer: Self-pay

## 2019-08-13 DIAGNOSIS — F4323 Adjustment disorder with mixed anxiety and depressed mood: Secondary | ICD-10-CM | POA: Diagnosis not present

## 2019-08-13 NOTE — Patient Instructions (Signed)

## 2019-08-16 ENCOUNTER — Other Ambulatory Visit: Payer: Self-pay

## 2019-08-16 ENCOUNTER — Inpatient Hospital Stay (HOSPITAL_COMMUNITY)
Admission: AD | Admit: 2019-08-16 | Discharge: 2019-08-19 | DRG: 833 | Disposition: A | Discharge status: Home / Self Care | Payer: Medicaid Other | Attending: Family Medicine | Admitting: Family Medicine

## 2019-08-16 ENCOUNTER — Encounter (HOSPITAL_COMMUNITY): Payer: Self-pay | Admitting: Family Medicine

## 2019-08-16 ENCOUNTER — Inpatient Hospital Stay (HOSPITAL_COMMUNITY): Payer: Medicaid Other

## 2019-08-16 DIAGNOSIS — O26892 Other specified pregnancy related conditions, second trimester: Secondary | ICD-10-CM | POA: Diagnosis not present

## 2019-08-16 DIAGNOSIS — Z20822 Contact with and (suspected) exposure to covid-19: Secondary | ICD-10-CM | POA: Diagnosis present

## 2019-08-16 DIAGNOSIS — O418X20 Other specified disorders of amniotic fluid and membranes, second trimester, not applicable or unspecified: Secondary | ICD-10-CM

## 2019-08-16 DIAGNOSIS — Z3A21 21 weeks gestation of pregnancy: Secondary | ICD-10-CM

## 2019-08-16 DIAGNOSIS — Z880 Allergy status to penicillin: Secondary | ICD-10-CM

## 2019-08-16 DIAGNOSIS — O42919 Preterm premature rupture of membranes, unspecified as to length of time between rupture and onset of labor, unspecified trimester: Secondary | ICD-10-CM | POA: Insufficient documentation

## 2019-08-16 DIAGNOSIS — R109 Unspecified abdominal pain: Secondary | ICD-10-CM

## 2019-08-16 DIAGNOSIS — O4692 Antepartum hemorrhage, unspecified, second trimester: Secondary | ICD-10-CM | POA: Diagnosis present

## 2019-08-16 DIAGNOSIS — O468X2 Other antepartum hemorrhage, second trimester: Secondary | ICD-10-CM

## 2019-08-16 DIAGNOSIS — O42912 Preterm premature rupture of membranes, unspecified as to length of time between rupture and onset of labor, second trimester: Secondary | ICD-10-CM | POA: Diagnosis not present

## 2019-08-16 DIAGNOSIS — O0990 Supervision of high risk pregnancy, unspecified, unspecified trimester: Secondary | ICD-10-CM

## 2019-08-16 LAB — TYPE AND SCREEN
ABO/RH(D): A POS
Antibody Screen: NEGATIVE

## 2019-08-16 LAB — CBC
HCT: 35.3 % — ABNORMAL LOW (ref 36.0–46.0)
Hemoglobin: 10.9 g/dL — ABNORMAL LOW (ref 12.0–15.0)
MCH: 26.1 pg (ref 26.0–34.0)
MCHC: 30.9 g/dL (ref 30.0–36.0)
MCV: 84.4 fL (ref 80.0–100.0)
Platelets: 236 10*3/uL (ref 150–400)
RBC: 4.18 MIL/uL (ref 3.87–5.11)
RDW: 13.2 % (ref 11.5–15.5)
WBC: 10.2 10*3/uL (ref 4.0–10.5)
nRBC: 0 % (ref 0.0–0.2)

## 2019-08-16 LAB — WET PREP, GENITAL
Clue Cells Wet Prep HPF POC: NONE SEEN
Sperm: NONE SEEN
Trich, Wet Prep: NONE SEEN
Yeast Wet Prep HPF POC: NONE SEEN

## 2019-08-16 LAB — ABO/RH: ABO/RH(D): A POS

## 2019-08-16 MED ORDER — DOCUSATE SODIUM 100 MG PO CAPS
100.0000 mg | ORAL_CAPSULE | Freq: Every day | ORAL | Status: DC
  Administered 2019-08-16 – 2019-08-19 (×4): 100 mg via ORAL
  Filled 2019-08-16 (×4): qty 1

## 2019-08-16 MED ORDER — ALBUTEROL SULFATE (2.5 MG/3ML) 0.083% IN NEBU: 2.5000 mg | INHALATION_SOLUTION | RESPIRATORY_TRACT | Status: DC | PRN

## 2019-08-16 MED ORDER — LORATADINE 10 MG PO TABS
10.0000 mg | ORAL_TABLET | Freq: Every day | ORAL | Status: DC | PRN
  Administered 2019-08-16 – 2019-08-19 (×3): 10 mg via ORAL
  Filled 2019-08-16 (×3): qty 1

## 2019-08-16 MED ORDER — SODIUM CHLORIDE 0.9 % IV SOLN: 250.0000 mL | INTRAVENOUS | Status: DC | PRN

## 2019-08-16 MED ORDER — ACETAMINOPHEN 325 MG PO TABS
650.0000 mg | ORAL_TABLET | ORAL | Status: DC | PRN
  Administered 2019-08-17: 650 mg via ORAL
  Filled 2019-08-16: qty 2

## 2019-08-16 MED ORDER — ZOLPIDEM TARTRATE 5 MG PO TABS: 5.0000 mg | ORAL_TABLET | Freq: Every evening | ORAL | Status: DC | PRN

## 2019-08-16 MED ORDER — SODIUM CHLORIDE 0.9% FLUSH: 3.0000 mL | INTRAVENOUS | Status: DC | PRN

## 2019-08-16 MED ORDER — CALCIUM CARBONATE ANTACID 500 MG PO CHEW: 2.0000 | CHEWABLE_TABLET | ORAL | Status: DC | PRN

## 2019-08-16 MED ORDER — PRENATAL MULTIVITAMIN CH
1.0000 | ORAL_TABLET | Freq: Every day | ORAL | Status: DC
  Administered 2019-08-17 – 2019-08-19 (×3): 1 via ORAL
  Filled 2019-08-16 (×3): qty 1

## 2019-08-16 MED ORDER — SODIUM CHLORIDE 0.9 % IV SOLN: INTRAVENOUS | Status: DC

## 2019-08-16 MED ORDER — FLUTICASONE PROPIONATE 50 MCG/ACT NA SUSP
1.0000 | Freq: Every day | NASAL | Status: DC
  Administered 2019-08-16 – 2019-08-19 (×4): 1 via NASAL
  Filled 2019-08-16: qty 16

## 2019-08-16 MED ORDER — SODIUM CHLORIDE 0.9% FLUSH
3.0000 mL | Freq: Two times a day (BID) | INTRAVENOUS | Status: DC
  Administered 2019-08-16 – 2019-08-18 (×5): 3 mL via INTRAVENOUS

## 2019-08-16 MED ORDER — VALACYCLOVIR HCL 500 MG PO TABS
500.0000 mg | ORAL_TABLET | Freq: Two times a day (BID) | ORAL | Status: DC
  Administered 2019-08-16 – 2019-08-19 (×6): 500 mg via ORAL
  Filled 2019-08-16 (×6): qty 1

## 2019-08-16 NOTE — MAU Provider Note (Addendum)
Chief Complaint  Patient presents with  . Vaginal Bleeding     First Provider Initiated Contact with Patient 08/16/19 1656      S: Yesenia Weeks  is a 44 y.o. y.o. year old G28P3103 female at [redacted]w[redacted]d weeks gestation (dated by 66 week [place]) who presents to MAU reporting feeling a pop sensation around 1500 and feeling something running down her legs. Sat on the toilet and filled up with blood. Blood type A pos.    Contractions: Rare cramping Vaginal bleeding: Moderate BRB Fetal movement: Decreased  Nursing Staff Provider  Office Location  [facility] Dating  14 week [place]  Language   English Anatomy US  Normal  Flu Vaccine   05/22/19 Genetic Screen  NIPS:normal Mat21  TDaP vaccine    Hgb A1C or  GTT Early  Third trimester   Rhogam   NA   LAB RESULTS   Feeding Plan  Breast/Bottle Blood Type A/Positive/-- (11/23 1515)   Contraception  IUD Antibody Negative (11/23 1515)  Circumcision   Rubella 3.32 (11/23 1515)  Pediatrician   RPR Non Reactive (11/23 1515)   Support Person  FOB: Chris Beauford HBsAg Negative (11/23 1515)   Prenatal Classes  HIV Non Reactive (11/23 1515)  BTL Consent  GBS  (For PCN allergy, check sensitivities)   VBAC Consent  Pap     Hgb Electro  normal  BP Cuff  ordered 07/21/19 CF     SMA 2 copies    Waterbirth  [ ]  Class [ ]  Consent [ ]  CNM visit    O:  Patient Vitals for the past 24 hrs:  BP Temp Temp src Pulse Resp SpO2  08/16/19 1644 125/71 98.6 F (37 C) Oral 97 16 100 %   General: NAD Heart: Regular rate Lungs: Normal rate and effort Abd: Soft, NT, Gravid, S=D Pelvic: NEFG, questionable pooling, moderate amount of thin, odorless blood. No visible blood.    Visually closed. No polyp seen, but incomplete visualization of posterior lip of cervix.   FHR 135 by doppler  Pos Fern  Amnisure deferred due to vaginal bleeding   Results for orders placed or performed during the hospital encounter of 08/16/19 (from the past 24 hour(s))  Wet prep, genital     Status:  Abnormal   Collection Time: 08/16/19  5:05 PM   Specimen: Cervix; Genital  Result Value Ref Range   Yeast Wet Prep HPF POC NONE SEEN NONE SEEN   Trich, Wet Prep NONE SEEN NONE SEEN   Clue Cells Wet Prep HPF POC NONE SEEN NONE SEEN   WBC, Wet Prep HPF POC MODERATE (A) NONE SEEN   Sperm NONE SEEN   Type and screen     Status: None   Collection Time: 08/16/19  5:08 PM  Result Value Ref Range   ABO/RH(D) A POS    Antibody Screen NEG    Sample Expiration      08/19/2019,2359 Performed at [facility], [address] [place], [facility] [address]   CBC     Status: Abnormal   Collection Time: 08/16/19  5:08 PM  Result Value Ref Range   WBC 10.2 4.0 - 10.5 K/uL   RBC 4.18 3.87 - 5.11 MIL/uL   Hemoglobin 10.9 (L) 12.0 - 15.0 g/dL   HCT [place] (L) 40981 - 08/18/19 %   MCV 84.4 80.0 - 100.0 fL   MCH 26.1 26.0 - 34.0 pg   MCHC 30.9 30.0 - 36.0 g/dL   RDW 19.1 47.8 -  15.5 %   Platelets 236 150 - 400 K/uL   nRBC 0.0 0.0 - 0.2 %  ABO/Rh     Status: None (Preliminary result)   Collection Time: 08/16/19  5:08 PM  Result Value Ref Range   ABO/RH(D) A POS    No rh immune globuloin      NOT A RH IMMUNE GLOBULIN CANDIDATE, PT RH POSITIVE Performed at [facility] [address] [place], [place] [address]     A: [redacted]w[redacted]d week IUP PPPROM, no evidence of labor Large SCH/Abruption   P: Observation of OBSC per consult w/ Donnamae Jude, MD.  Dr. Kennon Rounds at [facility] discussing [place], possible outcomes.  Tamala Julian, [place], [place] 08/16/2019 7:27 PM  2

## 2019-08-16 NOTE — Plan of Care (Signed)

## 2019-08-16 NOTE — H&P (Signed)
Chief Complaint  Patient presents with  . Vaginal Bleeding     First Provider Initiated Contact with Patient 08/16/19 1656      S: Yesenia Weeks  is a 44 y.o. y.o. year old G41P3103 female at [redacted]w[redacted]d weeks gestation (dated by 57 week [place]) who presents to MAU reporting feeling a pop sensation around 1500 and feeling something running down her legs. Sat on the toilet and filled up with blood. Blood type A pos.    Contractions: Rare cramping Vaginal bleeding: Moderate BRB Fetal movement: Decreased  Nursing Staff Provider  Office Location  [facility] Dating  14 week [place]  Language   English Anatomy US  Normal  Flu Vaccine   05/22/19 Genetic Screen  NIPS:normal Mat21  TDaP vaccine    Hgb A1C or  GTT Early  Third trimester   Rhogam   NA   LAB RESULTS   Feeding Plan  Breast/Bottle Blood Type A/Positive/-- (11/23 1515)   Contraception  IUD Antibody Negative (11/23 1515)  Circumcision   Rubella 3.32 (11/23 1515)  Pediatrician   RPR Non Reactive (11/23 1515)   Support Person  FOB: Chris Beauford HBsAg Negative (11/23 1515)   Prenatal Classes  HIV Non Reactive (11/23 1515)  BTL Consent  GBS  (For PCN allergy, check sensitivities)   VBAC Consent  Pap     Hgb Electro  normal  BP Cuff  ordered 07/21/19 CF     SMA 2 copies    Waterbirth  [ ]  Class [ ]  Consent [ ]  CNM visit    O:  Patient Vitals for the past 24 hrs:  BP Temp Temp src Pulse Resp SpO2  08/16/19 1644 125/71 98.6 F (37 C) Oral 97 16 100 %   General: NAD Heart: Regular rate Lungs: Normal rate and effort Abd: Soft, NT, Gravid, S=D Pelvic: NEFG, questionable pooling, moderate amount of thin, odorless blood. No visible blood.    Visually closed. No polyp seen, but incomplete visualization of posterior lip of cervix.   FHR 135 by doppler  Pos Fern  Amnisure deferred due to vaginal bleeding   Imaging  Prelim shows 8 cm SCH, CL 6.37 cm, subjectively nml fluid, breech, anterior placenta, no previa  LABS Results for orders  placed or performed during the hospital encounter of 08/16/19 (from the past 24 hour(s))  Wet prep, genital     Status: Abnormal   Collection Time: 08/16/19  5:05 PM   Specimen: Cervix; Genital  Result Value Ref Range   Yeast Wet Prep HPF POC NONE SEEN NONE SEEN   Trich, Wet Prep NONE SEEN NONE SEEN   Clue Cells Wet Prep HPF POC NONE SEEN NONE SEEN   WBC, Wet Prep HPF POC MODERATE (A) NONE SEEN   Sperm NONE SEEN   Type and screen     Status: None   Collection Time: 08/16/19  5:08 PM  Result Value Ref Range   ABO/RH(D) A POS    Antibody Screen NEG    Sample Expiration      08/19/2019,2359 Performed at [facility], [address] [place], [facility] [address]   CBC     Status: Abnormal   Collection Time: 08/16/19  5:08 PM  Result Value Ref Range   WBC 10.2 4.0 - 10.5 K/uL   RBC 4.18 3.87 - 5.11 MIL/uL   Hemoglobin 10.9 (L) 12.0 - 15.0 g/dL   HCT [place] (L) [address] - 08/18/19 %   MCV 84.4 80.0 - 100.0 fL  MCH 26.1 26.0 - 34.0 pg   MCHC 30.9 30.0 - 36.0 g/dL   RDW 13.2 11.5 - 15.5 %   Platelets 236 150 - 400 K/uL   nRBC 0.0 0.0 - 0.2 %  ABO/Rh     Status: None (Preliminary result)   Collection Time: 08/16/19  5:08 PM  Result Value Ref Range   ABO/RH(D) A POS    No rh immune globuloin      NOT A RH IMMUNE GLOBULIN CANDIDATE, PT RH POSITIVE Performed at [facility] [address], [place], [place] [address]      A: [redacted]w[redacted]d week IUP PPPROM, no evidence of labor Large SCH/Abruption   P: Observation of OBSC per consult w/ Donnamae Jude, MD.  Dr. Kennon Rounds at [facility] discussing [place], possible outcomes.  Tamala Julian, [place], [place] 08/16/2019 7:27 PM  2

## 2019-08-16 NOTE — MAU Note (Signed)
Yesenia Weeks is a 44 y.o. at [redacted]w[redacted]d here in MAU reporting: states within the past hour she felt a gush and noticed it was bleeding. States she went to the bathroom and the toilet filled up with blood. Was having some cramping but is not feeling any currently.  Onset of complaint: today  Pain score: 0/10  Vitals:   08/16/19 1644  BP: 125/71  Pulse: 97  Resp: 16  Temp: 98.6 F (37 C)  SpO2: 100%     Lab orders placed from triage: none

## 2019-08-17 DIAGNOSIS — O09522 Supervision of elderly multigravida, second trimester: Secondary | ICD-10-CM | POA: Diagnosis not present

## 2019-08-17 DIAGNOSIS — O26893 Other specified pregnancy related conditions, third trimester: Secondary | ICD-10-CM | POA: Diagnosis present

## 2019-08-17 DIAGNOSIS — O42912 Preterm premature rupture of membranes, unspecified as to length of time between rupture and onset of labor, second trimester: Secondary | ICD-10-CM | POA: Diagnosis present

## 2019-08-17 DIAGNOSIS — Z20822 Contact with and (suspected) exposure to covid-19: Secondary | ICD-10-CM | POA: Diagnosis present

## 2019-08-17 DIAGNOSIS — O4692 Antepartum hemorrhage, unspecified, second trimester: Secondary | ICD-10-CM | POA: Diagnosis present

## 2019-08-17 DIAGNOSIS — Z3A22 22 weeks gestation of pregnancy: Secondary | ICD-10-CM | POA: Diagnosis not present

## 2019-08-17 DIAGNOSIS — Z880 Allergy status to penicillin: Secondary | ICD-10-CM | POA: Diagnosis not present

## 2019-08-17 DIAGNOSIS — Z3A21 21 weeks gestation of pregnancy: Secondary | ICD-10-CM | POA: Diagnosis not present

## 2019-08-17 LAB — GC/CHLAMYDIA PROBE AMP (~~LOC~~) NOT AT ARMC
Chlamydia: NEGATIVE
Comment: NEGATIVE
Comment: NORMAL
Neisseria Gonorrhea: NEGATIVE

## 2019-08-17 LAB — SARS CORONAVIRUS 2 (TAT 6-24 HRS): SARS Coronavirus 2: NEGATIVE

## 2019-08-17 NOTE — Progress Notes (Signed)
Patient ID: Yesenia Weeks, female   DOB: January 18, 1976, 44 y.o.   MRN: [id number] FACULTY PRACTICE ANTEPARTUM(COMPREHENSIVE) NOTE  Yesenia Weeks is a 44 y.o. [id number] with Estimated Date of Delivery: 12/21/19   By  LMP, early ultrasound 100w0d  who is admitted for 2nd trimester bleeding.    Fetal presentation is unsure. Length of Stay:  1  Days  Date of admission:08/16/2019  Subjective: Much less bleeding but having some Patient reports the fetal movement as active. Patient reports uterine contraction  activity as none. Patient reports  vaginal bleeding as less flow than a normal period. Patient describes fluid per vagina as None.  Vitals:  Blood pressure 121/71, pulse 88, temperature 98 F (36.7 C), temperature source Oral, resp. rate 18, height 5\' 7"  (1.702 m), weight 87 kg, last menstrual period 05/18/2019, SpO2 100 %, currently breastfeeding. Vitals:   08/16/19 2030 08/16/19 2355 08/17/19 0410 08/17/19 0758  BP:  129/68 127/68 121/71  Pulse:  96 95 88  Resp:  18 18 18   Temp:  (!) 97.5 F (36.4 C) 98.1 F (36.7 C) 98 F (36.7 C)  TempSrc: Oral Oral Oral Oral  SpO2:  99% 100% 100%  Weight: 87 kg     Height: 5\' 7"  (1.702 m)      Physical Examination:  General appearance - alert, well appearing, and in no distress Abdomen - soft, nontender, nondistended, no masses or organomegaly Fundal Height:  size equals dates Pelvic Exam:  examination not indicated Cervical Exam: Not evaluated.  Extremities: extremities normal, atraumatic, no cyanosis or edema with DTRs 2+ bilaterally Membranes:unclear membrane status,   Fetal Monitoring:     FHR 160s  Labs:  Results for orders placed or performed during the hospital encounter of 08/16/19 (from the past 24 hour(s))  Wet prep, genital   Collection Time: 08/16/19  5:05 PM   Specimen: Cervix; Genital  Result Value Ref Range   Yeast Wet Prep HPF POC NONE SEEN NONE SEEN   Trich, Wet Prep NONE SEEN NONE SEEN   Clue Cells Wet Prep HPF POC NONE  SEEN NONE SEEN   WBC, Wet Prep HPF POC MODERATE (A) NONE SEEN   Sperm NONE SEEN   CBC   Collection Time: 08/16/19  5:08 PM  Result Value Ref Range   WBC 10.2 4.0 - 10.5 K/uL   RBC 4.18 3.87 - 5.11 MIL/uL   Hemoglobin 10.9 (L) 12.0 - 15.0 g/dL   HCT 08/18/19 (L) 08/18/19 - 08/18/19 %   MCV 84.4 80.0 - 100.0 fL   MCH 26.1 26.0 - 34.0 pg   MCHC 30.9 30.0 - 36.0 g/dL   RDW 97.0 26.3 - 78.5 %   Platelets 236 150 - 400 K/uL   nRBC 0.0 0.0 - 0.2 %  Type and screen   Collection Time: 08/16/19  5:08 PM  Result Value Ref Range   ABO/RH(D) A POS    Antibody Screen NEG    Sample Expiration      08/19/2019,2359 Performed at [facility], [address] [place], [facility] [address]   ABO/Rh   Collection Time: 08/16/19  5:08 PM  Result Value Ref Range   ABO/RH(D) A POS    No rh immune globuloin      NOT A RH IMMUNE GLOBULIN CANDIDATE, PT RH POSITIVE Performed at [facility], [address] [place], [facility] [address]   SARS CORONAVIRUS 2 (TAT 6-24 HRS) Nasopharyngeal Nasopharyngeal Swab   Collection Time: 08/16/19  7:28 PM  Specimen: Nasopharyngeal Swab  Result Value Ref Range   SARS Coronavirus 2 NEGATIVE NEGATIVE    Imaging Studies:    No results found.   Medications:  Scheduled . docusate sodium  100 mg Oral Daily  . fluticasone  1 spray Each Nare Daily  . prenatal multivitamin  1 tablet Oral Q1200  . sodium chloride flush  3 mL Intravenous Q12H  . valACYclovir  500 mg Oral BID   I have reviewed the patient's current medications.  ASSESSMENT: [id number] [redacted]w[redacted]d Estimated Date of Delivery: 12/21/19  2nd trimester bleeding with 8 cm blood collection in lower uterine segment Questionable PROM, seems less likely but difficult to differentiate with her bleeding  PLAN: >in house observation and follow her clinical course >repeat sonogram tomorrow to evaluate the blood collection stability and reassess fluid volume >clinical management based on her course and sonogram findings  tomorrow  Yesenia Weeks Yesenia Weeks 08/17/2019,10:09 AM

## 2019-08-17 NOTE — Progress Notes (Signed)
Family Medicine PCP Social Visit  Stopped by Gerri's room to see her and offer support. I have been PCP for Yesenia Weeks and her older kids for the last 6-7 years and know her fairly well. She is understandably quite anxious about her current situation but is trying to remain calm. She has questions about whether she should be on bedrest when she goes home. Encouraged her to discuss these questions with her OB team. She appreciates how the [facility] team has been honest and direct with her about all the possible outcomes of this pregnancy.  I offered to have chaplain come by and visit her, which she thought would be helpful. I will place order for consult to chaplain.  I greatly appreciate the excellent care provided by the [facility] team. Please contact me if I can be helpful in her care.  Levert Feinstein, MD [facility] Family Medicine Faculty Pager [phone]

## 2019-08-18 ENCOUNTER — Inpatient Hospital Stay (HOSPITAL_COMMUNITY): Payer: Medicaid Other

## 2019-08-18 DIAGNOSIS — Z3A22 22 weeks gestation of pregnancy: Secondary | ICD-10-CM

## 2019-08-18 DIAGNOSIS — O4692 Antepartum hemorrhage, unspecified, second trimester: Secondary | ICD-10-CM

## 2019-08-18 DIAGNOSIS — O09522 Supervision of elderly multigravida, second trimester: Secondary | ICD-10-CM

## 2019-08-18 NOTE — Progress Notes (Signed)
Patient ID: Yesenia Weeks, female   DOB: May 15, 1976, 44 y.o.   MRN: [id number] FACULTY PRACTICE ANTEPARTUM(COMPREHENSIVE) NOTE  Yesenia Weeks is a 44 y.o. [id number] with Estimated Date of Delivery: 12/21/19   By   [redacted]w[redacted]d  who is admitted for 2nd trimester bleeding.    Fetal presentation is unsure. Length of Stay:  2  Days  Date of admission:08/16/2019  Subjective: Minimal spotting over the past 24 hours Patient reports the fetal movement as active. Patient reports uterine contraction  activity as none. Patient reports  vaginal bleeding as spotting. Patient describes fluid per vagina as None.  Vitals:  Blood pressure 121/66, pulse 90, temperature 98.3 F (36.8 C), temperature source Oral, resp. rate 18, height 5\' 7"  (1.702 m), weight 87 kg, last menstrual period 05/18/2019, SpO2 100 %, currently breastfeeding. Vitals:   08/17/19 1157 08/17/19 1656 08/17/19 1937 08/17/19 2257  BP: 134/72 122/73 121/68 121/66  Pulse: 95 90 91 90  Resp: 18 18 18 18   Temp: 98.1 F (36.7 C) 98.4 F (36.9 C) 98.1 F (36.7 C) 98.3 F (36.8 C)  TempSrc: Oral Oral Oral Oral  SpO2: 100% 100% 100% 100%  Weight:      Height:       Physical Examination:  General appearance - alert, well appearing, and in no distress Abdomen - soft, nontender, nondistended, no masses or organomegaly Fundal Height:  size equals dates Pelvic Exam:  examination not indicated Cervical Exam: Not evaluated. . Extremities: extremities normal, atraumatic, no cyanosis or edema Membranes:uncertain  Fetal Monitoring:     FHR 150s this am  Labs:  No results found for this or any previous visit (from the past 24 hour(s)).  Imaging Studies:    [place] MFM OB Limited  Result Date: 08/17/2019 ----------------------------------------------------------------------  OBSTETRICS REPORT                       (Signed Final 08/17/2019 01:59 pm) ---------------------------------------------------------------------- Patient Info  ID #:       [id number]                           D.O.B.:  1976/06/06 (43 yrs)  Name:       Yesenia Weeks                    Visit Date: 08/16/2019 06:19 pm ---------------------------------------------------------------------- Performed By  Performed By:     Wilnette Kales        Secondary Phy.:    [facility] MAU/Triage                    RDMS,RVT  Attending:        Sander Nephew      Tertiary Phy.:     Manya Silvas                    MD                                                              CNM  Referred By:      Family Practice        Address:           [address]  [facility]                                                              [address]                                                              [place]                                                              [id number]  Ref. Address:     [address]         [facility]                    [employer] ---------------------------------------------------------------------- Orders   #  Description                          Code         Ordered By   1  [place] MFM OB LIMITED                    [phone]     Dorathy Kinsman  ----------------------------------------------------------------------   #  Order #                    Accession #                 Episode #   1  [id number]                  [id number]                  [id number]  ---------------------------------------------------------------------- Indications   Vaginal bleeding in pregnancy, second          O46.92   trimester   Abdominal pain in pregnancy                    O99.89   Advanced maternal age multigravida 68+,        O85.522   second trimester   Poor obstetric history: Previous fetal growth  O09.299   restriction (FGR) per pt   Poor obstetric history (prior pre-term labor   O09.219   with 1st preg; 3 term since)   [redacted] weeks gestation of pregnancy                Z3A.21   Leakage of amniotic fluid ?  O42.90  ---------------------------------------------------------------------- Fetal Evaluation  Num Of Fetuses:          1  Fetal Heart Rate(bpm):   148  Cardiac Activity:        Observed  Presentation:            Breech  Placenta:                Anterior  P. Cord Insertion:       Visualized, central  Amniotic Fluid  AFI FV:      Within normal limits                              Largest Pocket(cm)                              4.45  Comment:    Large (8.2x4x2.2 cm) heterogeneous subchorionic              hemorrhage noted  next to the internal os (in lower uterin              part) ---------------------------------------------------------------------- Gestational Age  LMP:           12w 6d        Date:  05/18/19                 EDD:   02/22/20  Best:          [place] 6d     Det. By:  U/S  (06/24/19)          EDD:   12/21/19 ---------------------------------------------------------------------- Anatomy  Ventricles:            Appears normal         Stomach:                Appears normal, left                                                                        sided  Cerebellum:            Appears normal         Kidneys:                Appear normal  Posterior Fossa:       Appears normal         Bladder:                Appears normal ---------------------------------------------------------------------- Cervix Uterus Adnexa  Cervix  Length:           6.37  cm.  Normal appearance by transabdominal scan.  Uterus  No abnormality visualized.  Left Ovary  No adnexal mass visualized.  Right Ovary  No adnexal mass visualized.  Cul De Sac  No free fluid seen. ---------------------------------------------------------------------- Impression  Vagial bleeding  Limited exam  Suggestive of heterogeneous subchorionic hemorrohage  adjacent to the cervix. ---------------------------------------------------------------------- Recommendations  Consider repeat growth in 1 week.  ----------------------------------------------------------------------               Lin Landsman, MD Electronically Signed Final Report   08/17/2019 01:59 pm ----------------------------------------------------------------------    Medications:  Scheduled . docusate sodium  100 mg Oral Daily  . fluticasone  1 spray Each Nare Daily  . prenatal multivitamin  1 tablet Oral Q1200  . sodium chloride flush  3 mL Intravenous Q12H  . valACYclovir  500 mg Oral BID   I have reviewed the patient's current medications.  ASSESSMENT: 1.  [id number] [redacted]w[redacted]d Estimated Date of Delivery: 12/21/19  2.  2nd trimester bleeding,  With subchorionic hematoma noted, 8cm, minimal       bleeding since admission 3,  Membrane status uncertain  PLAN: Repeat sonogram today to evaluate the hematoma and fluid volume around the baby Disposition will depend on the sonogram findings   Lazaro Arms 08/18/2019,7:21 AM

## 2019-08-19 ENCOUNTER — Telehealth: Payer: Self-pay

## 2019-08-19 DIAGNOSIS — O4692 Antepartum hemorrhage, unspecified, second trimester: Secondary | ICD-10-CM | POA: Diagnosis present

## 2019-08-19 NOTE — Progress Notes (Signed)
Discharge instructions given to pt. Discussed signs and symptoms to report to the MD, upcoming appointments, and meds. Encouraged pt to come back to the hospital if she experiences an increase in vaginal bleeding, abdominal pain, and pressure. Pt verbalizes understanding and has no questions or concerns at this time. Pt discharged from hospital in stable condition.

## 2019-08-19 NOTE — Telephone Encounter (Signed)
Returned call to patient. Offered reassurance. Temp of 99 is not a fever and if the OB team is ok with sending her home, I trust their care. Patient very appreciative.  Latrelle Dodrill, MD

## 2019-08-19 NOTE — Discharge Summary (Signed)
Patient ID: Yesenia Weeks MRN: [id number] DOB/AGE: [birth year] 44 y.o.  Admit date: 08/16/2019 Discharge date: 08/19/2019  Admission Diagnoses:Second trimester bleeding   Discharge Diagnoses: same  Prenatal Procedures: ultrasound  Consults: Neonatology, Maternal Fetal Medicine  Hospital Course:  This is a 44 y.o. X6D4709 with IUP at 57w2dadmitted for vaginal bleeding. She was admitted with bleeding.  Her bleeding was decreased from admission and was brown.  UKoreashowed normal AF and slightly decreased hemorrhage compared to 1/24. She was deemed stable for discharge to home with outpatient follow up.  Discharge Exam: Temp:  [97.4 F (36.3 C)-98.5 F (36.9 C)] 97.4 F (36.3 C) (01/27 0841) Pulse Rate:  [87-98] 90 (01/27 0841) Resp:  [18-19] 18 (01/27 0841) BP: (113-134)/(65-75) 125/71 (01/27 0841) SpO2:  [97 %-100 %] 100 % (01/27 0841) Physical Examination: CONSTITUTIONAL: Well-developed, well-nourished female in no acute distress.  HENT:  Normocephalic, atraumatic, External right and left ear normal. Oropharynx is clear and moist EYES: Conjunctivae and EOM are normal. Pupils are equal, round, and reactive to light. No scleral icterus.  NECK: Normal range of motion, supple, no masses SKIN: Skin is warm and dry. No rash noted. Not diaphoretic. No erythema. No pallor. [place] Alert and oriented to person, place, and time. Normal reflexes, muscle tone coordination. No cranial nerve deficit noted. PSYCHIATRIC: Normal mood and affect. Normal behavior. Normal judgment and thought content. CARDIOVASCULAR: Normal heart rate noted, regular rhythm RESPIRATORY: Effort and breath sounds normal, no problems with respiration noted MUSCULOSKELETAL: Normal range of motion. No edema and no tenderness. 2+ distal pulses. ABDOMEN: Soft, nontender, nondistended, gravid.    Significant Diagnostic Studies:  Results for orders placed or performed during the hospital encounter of 08/16/19 (from the  past 168 hour(s))  GC/Chlamydia probe amp [facility]  Collection Time: 08/16/19  4:56 PM  Result Value Ref Range   Neisseria Gonorrhea Negative    Chlamydia Negative    Comment Normal Reference [place] Chlamydia - Negative    Comment      Normal Reference Range Neisseria Gonorrhea - Negative  Wet prep, genital   Collection Time: 08/16/19  5:05 PM   Specimen: Cervix; Genital  Result Value Ref Range   Yeast Wet Prep HPF POC NONE SEEN NONE SEEN   Trich, Wet Prep NONE SEEN NONE SEEN   Clue Cells Wet Prep HPF POC NONE SEEN NONE SEEN   WBC, Wet Prep HPF POC MODERATE (A) NONE SEEN   Sperm NONE SEEN   CBC   Collection Time: 08/16/19  5:08 PM  Result Value Ref Range   WBC 10.2 4.0 - 10.5 K/uL   RBC 4.18 3.87 - 5.11 MIL/uL   Hemoglobin 10.9 (L) 12.0 - 15.0 g/dL   HCT 35.3 (L) 36.0 - 46.0 %   MCV 84.4 80.0 - 100.0 fL   MCH 26.1 26.0 - 34.0 pg   MCHC 30.9 30.0 - 36.0 g/dL   RDW 13.2 11.5 - 15.5 %   Platelets 236 150 - 400 K/uL   nRBC 0.0 0.0 - 0.2 %  Type and screen   Collection Time: 08/16/19  5:08 PM  Result Value Ref Range   ABO/RH(D) A POS    Antibody Screen NEG    Sample Expiration      08/19/2019,2359 [facility] [address] [place] [address]  ABO/Rh   Collection Time: 08/16/19  5:08 PM  Result Value Ref Range   ABO/RH(D) A POS    No  rh immune globuloin      NOT A RH IMMUNE GLOBULIN CANDIDATE, PT RH POSITIVE Performed at [facility], [place] [address] [place], [place] [address]   SARS CORONAVIRUS 2 (TAT 6-24 HRS) Nasopharyngeal Nasopharyngeal Swab   Collection Time: 08/16/19  7:28 PM   Specimen: Nasopharyngeal Swab  Result Value Ref Range   SARS Coronavirus 2 NEGATIVE NEGATIVE    Discharge Condition: Stable  Disposition: Discharge disposition: 01-Home or Self Care        Discharge Instructions    Discharge patient   Complete by: As directed    Discharge disposition: 01-Home or Self Care   Discharge  patient date: 08/19/2019     Allergies as of 08/19/2019   No Known Allergies     Medication List    TAKE these medications   acetaminophen 500 MG tablet Commonly known as: TYLENOL Take 500 mg by mouth every 6 (six) hours as needed for mild pain or headache.   albuterol 108 (90 Base) MCG/ACT inhaler Commonly known as: VENTOLIN HFA INHALE 2 PUFFS INTO THE LUNGS EVERY 6 HOURS FOR UP TO 14 DAYS AS NEEDED FOR WHEEZING OR SHORTNESS OF BREATH What changed: See the new instructions.   aspirin EC 81 MG tablet Take 1 tablet (81 mg total) by mouth daily. Take after 12 weeks for prevention of preeclampsia later in pregnancy   Blood Pressure Kit Devi 1 Device by Does not apply route as needed.   cetirizine 10 MG tablet Commonly known as: ZYRTEC TAKE 1 TABLET(10 MG) BY MOUTH DAILY AS NEEDED What changed: See the new instructions.   fluticasone 50 MCG/ACT nasal spray Commonly known as: Flonase Place 1 spray into both nostrils daily.   Prenatal 27-1 MG Tabs Take 1 tablet by mouth daily.   valACYclovir 500 MG tablet Commonly known as: Valtrex Take 1 tablet (500 mg total) by mouth 2 (two) times daily.      Follow-up Information    Anyanwu, Sallyanne Havers, MD Follow up on 09/04/2019.   Specialty: Obstetrics and Gynecology Contact information: [address] [place] [address] [phone]           Signed: Emeterio Reeve M.D. 08/19/2019, 9:52 AM

## 2019-08-19 NOTE — Telephone Encounter (Signed)
Patient calls nurse line to speak with PCP for support. Patient stated she is about to be released from hospital, however patient feels this is not the best option. Patient stated her temperatures have been going up since arrival. They started in low 98s now 99.1. Patient is fearful she has an infection "brewing." Patient would really like to speak with PCP for guidance.

## 2019-08-19 NOTE — Discharge Instructions (Signed)
Vaginal Bleeding During Pregnancy, Second Trimester  A small amount of bleeding (spotting) from the vagina is common during pregnancy. Sometimes the bleeding is normal and is not a sign of problems. In some other cases, it is a sign of something serious. Tell your doctor right away if there is any bleeding from your vagina. Follow these instructions at home: Activity  Follow your doctor's instructions about how active you can be.  If needed, make plans for someone to help with your normal activities.  Do not exercise or do activities that take a lot of effort until your doctor says that this is safe.  Do not lift anything that is heavier than 10 lb (4.5 kg) until your doctor says that this is safe.  Do not have sex or orgasms until your doctor says that this is safe. Medicines  Take over-the-counter and prescription medicines only as told by your doctor.  Do not take aspirin. It can cause bleeding. General instructions  Watch your condition for any changes.  Write down: ? The number of pads you use each day. ? How often you change pads. ? How soaked your pads are.  Do not use tampons.  Do not douche.  If you pass any tissue from your vagina, save it to show to your doctor.  Keep all follow-up visits as told by your doctor. This is important. Contact a doctor if:  You have bleeding in the vagina at any time during pregnancy.  You have cramps.  You have a fever that does not get better with medicine. Get help right away if:  You have very bad cramps in your back or belly (abdomen).  You have contractions.  You have chills.  You pass large clots or a lot of tissue from your vagina.  Your bleeding gets worse.  You feel light-headed.  You feel weak.  You pass out (faint).  You are leaking fluid from your vagina.  You have a gush of fluid from your vagina. Summary  Sometimes vaginal bleeding during pregnancy is normal and is not a problem. Sometimes it may  be a sign of something serious.  Tell your doctor about any bleeding from your vagina right away.  Follow your doctor's instructions about how active you can be. You may need someone to help you with your normal activities. This information is not intended to replace advice given to you by your health care provider. Make sure you discuss any questions you have with your health care provider. Document Revised: 10/28/2018 Document Reviewed: 10/10/2016 Elsevier Patient Education  2020 [employer]  

## 2019-08-21 NOTE — Progress Notes (Signed)
Patient ID: Yesenia Weeks, female   DOB: 09/28/1975, 44 y.o.   MRN: [id number] Patient seen and assessed by nursing staff during this encounter. I have reviewed the chart and agree with the documentation and plan.  Scheryl Darter, MD 08/21/2019 2:45 PM

## 2019-08-24 ENCOUNTER — Telehealth: Payer: Self-pay | Admitting: *Deleted

## 2019-08-24 NOTE — Telephone Encounter (Addendum)
Pt left VM stating that she was hospitalized last week for placental abruption and was discharged on 1/27. She is concerned about the blood in her uterus because she is not bleeding but is having some pain and is not sure what is normal. She wants to know if she can be seen sooner than scheduled visit in 2 weeks because she wants to check and be sure her baby is ok.   1220  I called pt and discussed her concern. She stated that no one explained to her what was going to happen with the blood in her uterus that was shown on ultrasound. She is having some brown spotting. Also she is having some occasional sharp pains. I explained to pt that the spotting is normal. We would be concerned if she had heavy bright red bleeding. She should go to the hospital if this occurs. The blood in her uterus will likely breakdown/dissolve as the pregnancy progresses provided she does not have any worsening abruption. Pt was advised that some occasional cramping of the abdomen or even brief sharp pain is not worrisome. These sensations can be from the baby moving, Braxton-Hicks contractions or ligaments stretching. She should go to the hospital if she develops very sharp, continuous pain - with or without bleeding. Pt voiced understanding of information provided however still has concerns for fetal well-being. Although pt reports good FM, she was offered a nurse visit today for FHR check. Pt stated that she does not have transportation today and requested a visit tomorrow. She was offered appt @ [phone] tomorrow and she agreed.

## 2019-08-25 ENCOUNTER — Ambulatory Visit (INDEPENDENT_AMBULATORY_CARE_PROVIDER_SITE_OTHER): Payer: Medicaid Other | Admitting: *Deleted

## 2019-08-25 ENCOUNTER — Other Ambulatory Visit: Payer: Self-pay

## 2019-08-25 DIAGNOSIS — O4692 Antepartum hemorrhage, unspecified, second trimester: Secondary | ICD-10-CM

## 2019-08-25 NOTE — Progress Notes (Signed)
Pt presents for FHR check due to anxiety related to recent placental abruption. She reports some dark spotting - denies bright red bleeding. She denies having any abdominal pain or cramping. FHR - 145 per doppler. Pt reports good FM daily. Discussion of Sx which would require return to hospital as well as what is within normal limits. Pt voiced understanding. Next office visit on 09/04/19.

## 2019-08-27 NOTE — Progress Notes (Signed)
Patient seen and assessed by nursing staff during this encounter. I have reviewed the chart and agree with the documentation and plan.  [place] Bing, MD 08/27/2019 12:38 PM

## 2019-09-04 ENCOUNTER — Ambulatory Visit (INDEPENDENT_AMBULATORY_CARE_PROVIDER_SITE_OTHER): Payer: Medicaid Other | Admitting: Obstetrics & Gynecology

## 2019-09-04 ENCOUNTER — Other Ambulatory Visit: Payer: Self-pay

## 2019-09-04 VITALS — BP 128/82 | HR 83 | Wt 198.7 lb

## 2019-09-04 DIAGNOSIS — O0990 Supervision of high risk pregnancy, unspecified, unspecified trimester: Secondary | ICD-10-CM

## 2019-09-04 DIAGNOSIS — Z3A24 24 weeks gestation of pregnancy: Secondary | ICD-10-CM

## 2019-09-04 DIAGNOSIS — O4692 Antepartum hemorrhage, unspecified, second trimester: Secondary | ICD-10-CM

## 2019-09-04 DIAGNOSIS — O0992 Supervision of high risk pregnancy, unspecified, second trimester: Secondary | ICD-10-CM

## 2019-09-04 NOTE — Patient Instructions (Signed)
Return to office for any scheduled appointments. Call the office or go to the MAU at [facility] if:  You begin to have strong, frequent contractions  Your water breaks.  Sometimes it is a big gush of fluid, sometimes it is just a trickle that keeps getting your panties wet or running down your legs  You have vaginal bleeding.  It is normal to have a small amount of spotting if your cervix was checked.   You do not feel your baby moving like normal.  If you do not, get something to eat and drink and lay down and focus on feeling your baby move.   If your baby is still not moving like normal, you should call the office or go to MAU.  Any other obstetric concerns.  TDaP Vaccine Pregnancy Get the Whooping Cough Vaccine While You Are Pregnant (CDC)  It is important for women to get the whooping cough vaccine in the third trimester of each pregnancy. Vaccines are the best way to prevent this disease. There are 2 different whooping cough vaccines. Both vaccines combine protection against whooping cough, tetanus and diphtheria, but they are for different age groups: Tdap: for everyone 11 years or older, including pregnant women  DTaP: for children 2 months through 6 years of age  You need the whooping cough vaccine during each of your pregnancies The recommended time to get the shot is during your 27th through 36th week of pregnancy, preferably during the earlier part of this time period. The Centers for Disease Control and Prevention (CDC) recommends that pregnant women receive the whooping cough vaccine for adolescents and adults (called Tdap vaccine) during the third trimester of each pregnancy. The recommended time to get the shot is during your 27th through 36th week of pregnancy, preferably during the earlier part of this time period. This replaces the original recommendation that pregnant women get the vaccine only if they had not previously received it. The  American College of Obstetricians and Gynecologists and the American College of Nurse-Midwives support this recommendation.  You should get the whooping cough vaccine while pregnant to pass protection to your baby frame support disabled and/or not supported in this browser  Learn why Laura decided to get the whooping cough vaccine in her 3rd trimester of pregnancy and how her baby girl was born with some protection against the disease. Also available on YouTube. After receiving the whooping cough vaccine, your body will create protective antibodies (proteins produced by the body to fight off diseases) and pass some of them to your baby before birth. These antibodies provide your baby some short-term protection against whooping cough in early life. These antibodies can also protect your baby from some of the more serious complications that come along with whooping cough. Your protective antibodies are at their highest about 2 weeks after getting the vaccine, but it takes time to pass them to your baby. So the preferred time to get the whooping cough vaccine is early in your third trimester. The amount of whooping cough antibodies in your body decreases over time. That is why CDC recommends you get a whooping cough vaccine during each pregnancy. Doing so allows each of your babies to get the greatest number of protective antibodies from you. This means each of your babies will get the best protection possible against this disease.  Getting the whooping cough vaccine while pregnant is better than getting the vaccine after you give birth Whooping cough vaccination during   pregnancy is ideal so your baby will have short-term protection as soon as he is born. This early protection is important because your baby will not start getting his whooping cough vaccines until he is 2 months old. These first few months of life are when your baby is at greatest risk for catching whooping cough. This is also when he's at  greatest risk for having severe, potentially life-threating complications from the infection. To avoid that gap in protection, it is best to get a whooping cough vaccine during pregnancy. You will then pass protection to your baby before he is born. To continue protecting your baby, he should get whooping cough vaccines starting at 2 months old. You may never have gotten the Tdap vaccine before and did not get it during this pregnancy. If so, you should make sure to get the vaccine immediately after you give birth, before leaving the hospital or birthing center. It will take about 2 weeks before your body develops protection (antibodies) in response to the vaccine. Once you have protection from the vaccine, you are less likely to give whooping cough to your newborn while caring for him. But remember, your baby will still be at risk for catching whooping cough from others. A recent study looked to see how effective Tdap was at preventing whooping cough in babies whose mothers got the vaccine while pregnant or in the hospital after giving birth. The study found that getting Tdap between 27 through 36 weeks of pregnancy is 85% more effective at preventing whooping cough in babies younger than 2 months old. Blood tests cannot tell if you need a whooping cough vaccine There are no blood tests that can tell you if you have enough antibodies in your body to protect yourself or your baby against whooping cough. Even if you have been sick with whooping cough in the past or previously received the vaccine, you still should get the vaccine during each pregnancy. Breastfeeding may pass some protective antibodies onto your baby By breastfeeding, you may pass some antibodies you have made in response to the vaccine to your baby. When you get a whooping cough vaccine during your pregnancy, you will have antibodies in your breast milk that you can share with your baby as soon as your milk comes in. However, your baby will not  get protective antibodies immediately if you wait to get the whooping cough vaccine until after delivering your baby. This is because it takes about 2 weeks for your body to create antibodies. Learn more about the health benefits of breastfeeding.  

## 2019-09-04 NOTE — Progress Notes (Signed)
PRENATAL VISIT NOTE  Subjective:  Yesenia Weeks is a 44 y.o. [id number] at 4731w4d being seen today for ongoing prenatal care.  She is currently monitored for the following issues for this high-risk pregnancy and has ANEMIA, IRON DEFICIENCY, UNSPEC.; Mood disorder (HCC); Allergic rhinitis; ACNE, MILD; ASTEATOTIC ECZEMA; HALLUX VALGUS, ACQUIRED; Genital herpes; Family history of breast cancer; Anxiety state; Tenosynovitis, de Quervain; Bacterial vaginosis; Cold sore; Exposure to COVID-19 virus; Canker sores oral; Supervision of high risk pregnancy, antepartum; Depression; AMA (advanced maternal age) multigravida 35+; History of IUFD; History of prior pregnancy with SGA newborn; History of substance abuse (HCC); and Antepartum bleeding, second trimester on their problem list.  Patient reports brown discharge.  Contractions: Not present. Vag. Bleeding: Scant.  Movement: Present. Denies leaking of fluid.   The following portions of the patient's history were reviewed and updated as appropriate: allergies, current medications, past family history, past medical history, past social history, past surgical history and problem list.   Objective:   Vitals:   09/04/19 1146  BP: 128/82  Pulse: 83  Weight: 198 lb 11.2 oz (90.1 kg)    Fetal Status: Fetal Heart Rate (bpm): 143   Movement: Present     General:  Alert, oriented and cooperative. Patient is in no acute distress.  Skin: Skin is warm and dry. No rash noted.   Cardiovascular: Normal heart rate noted  Respiratory: Normal respiratory effort, no problems with respiration noted  Abdomen: Soft, gravid, appropriate for gestational age.  Pain/Pressure: Present     Pelvic: Cervical exam deferred        Extremities: Normal range of motion.  Edema: None  Mental Status: Normal mood and affect. Normal behavior. Normal judgment and thought content.   Imaging: US MFM OB FOLLOW UP  Result Date:  08/18/2019 ----------------------------------------------------------------------  OBSTETRICS REPORT                       (Signed Final 08/18/2019 07:50 pm) ---------------------------------------------------------------------- Patient Info  ID #:       [id number]                          D.O.B.:  1975/11/28 (43 yrs)  Name:       Yesenia CarneAPRIL Weeks                    Visit Date: 08/18/2019 07:12 am ---------------------------------------------------------------------- Performed By  Performed By:     Sandi MealyJovancia Adrien        [facility]                    RD[facility]  Attending:        Lin Landsmanorenthian Booker      Tertiary Phy.:     Dorathy KinsmanVIRGINIA SMITH                    MD                                                              CNM  Referred By:      Family Practice        Address:           [address]  [place]                                                              Rd                                                              [place]  Ref. Address:     [place] [address]         [facility]                    [place] ---------------------------------------------------------------------- Orders   #  Description                          Code         Ordered By   1  [place] MFM OB FOLLOW UP                  [phone]     Tania Ade  ----------------------------------------------------------------------   #  Order #                    Accession #                 Episode #   1  [id number]                  [id number]                  [id number]  ---------------------------------------------------------------------- Indications   Encounter for other antenatal screening        Z36.2   follow-up   Vaginal bleeding in pregnancy, second          O46.92   trimester   Abdominal pain in pregnancy                    O99.89   Advanced maternal age  multigravida 12+,        O25.522   second trimester   Poor obstetric history: Previous fetal growth  O09.299   restriction (FGR) per pt   Poor obstetric history (prior pre-term labor   O09.219   with 1st preg; 3 term since)   Leakage of amniotic fluid ?  O42.90   [redacted] weeks gestation of pregnancy                Z3A.22  ---------------------------------------------------------------------- Fetal Evaluation  Num Of Fetuses:          1  Fetal Heart Rate(bpm):   138  Cardiac Activity:        Observed  Presentation:            Transverse, head to maternal right  Placenta:                Anterior  P. Cord Insertion:       Visualized  Amniotic Fluid  AFI FV:      Within normal limits                              Largest Pocket(cm)                              7.65  Comment:    Moderate complex heterogeneous hemorrhage noted              (6.3x2.0x7.5cm) located above cervical internal os, Possible              placental lake seen transverse left of placenta ---------------------------------------------------------------------- Biometry  BPD:      56.3  mm     G. Age:  23w 1d         84  %    CI:        71.86   %    70 - 86                                                          FL/HC:       18.3  %    18.4 - 20.2  HC:      211.4  mm     G. Age:  23w 1d         80  %    HC/AC:       1.14       1.06 - 1.25  AC:      186.1  mm     G. Age:  23w 3d         81  %    FL/BPD:      68.6  %    71 - 87  FL:       38.6  mm     G. Age:  22w 3d         48  %    FL/AC:       20.7  %    20 - 24  HUM:      36.4  mm     G. Age:  22w 5d         61  %  LV:        4.7  mm  Est. FW:     552   gm     1 lb 3 oz     84  % ---------------------------------------------------------------------- Gestational Age  LMP:           13w 1d        Date:  05/18/19                 EDD:   02/22/20  U/S Today:     23w 0d                                        EDD:   12/15/19  Best:          22w 1d     Det. By:  U/S  (06/24/19)          EDD:    12/21/19 ---------------------------------------------------------------------- Anatomy  Ventricles:            Appears normal         Stomach:                Appears normal, left                                                                        sided  Heart:                 Appears normal         Abdomen:                Appears normal                         (4CH, axis, and                         situs)  Diaphragm:             Appears normal         Bladder:                Appears normal ---------------------------------------------------------------------- Cervix Uterus Adnexa  Cervix  Length:           4.31  cm.  Normal appearance by transabdominal scan. ---------------------------------------------------------------------- Impression  Normal interval growth  Normal fetal movement and amniotic fluid index.  Placenta hemorrhage documented previously still present. ---------------------------------------------------------------------- Recommendations  Follow up growth in 4-6 weeks, given prior history of fetal  growth restriction.  Initiated weekly testing at 36 weeks given AMA of 44 yo. ----------------------------------------------------------------------               Lin Landsman, MD Electronically Signed Final Report   08/18/2019 07:50 pm ----------------------------------------------------------------------  [place] MFM OB Limited  Result Date: 08/17/2019 ----------------------------------------------------------------------  OBSTETRICS REPORT                       (Signed Final 08/17/2019 01:59 pm) ---------------------------------------------------------------------- Patient Info  ID #:       [id number]                          D.O.B.:  1976-05-31 (43 yrs)  Name:       Yesenia Weeks                    Visit Date: 08/16/2019 06:19 pm ---------------------------------------------------------------------- Performed By  Performed By:     Birdena Crandall  Secondary Phy.:    [facility] MAU/Triage                     RDMS,RVT  Attending:        Lin Landsman      Tertiary Phy.:     Dorathy Kinsman                    MD                                                              CNM  Referred By:      [facility]        Address:           [address]                    [facility]                                                              Rd                                                              Jacky Kindle                                                              [phone]  Ref. Address:     [address]         Location:          [facility]                    Street                                                              [employer] ---------------------------------------------------------------------- Orders   #  Description                          Code         Ordered By   1  [place] MFM OB LIMITED                    60454.09     Dorathy Kinsman  ----------------------------------------------------------------------   #  Order #                    Accession #  Episode #   1  [id number]                  [id number]                  [id number]  ---------------------------------------------------------------------- Indications   Vaginal bleeding in pregnancy, second          O46.92   trimester   Abdominal pain in pregnancy                    O99.89   Advanced maternal age multigravida 3135+,        O09.522   second trimester   Poor obstetric history: Previous fetal growth  O09.299   restriction (FGR) per pt   Poor obstetric history (prior pre-term labor   O09.219   with 1st preg; 3 term since)   [redacted] weeks gestation of pregnancy                Z3A.21   Leakage of amniotic fluid ?                    O42.90  ---------------------------------------------------------------------- Fetal Evaluation  Num Of Fetuses:          1  Fetal Heart Rate(bpm):   148  Cardiac Activity:        Observed  Presentation:            Breech  Placenta:                Anterior  P. Cord Insertion:       Visualized,  central  Amniotic Fluid  AFI FV:      Within normal limits                              Largest Pocket(cm)                              4.45  Comment:    Large (8.2x4x2.2 cm) heterogeneous subchorionic              hemorrhage noted  next to the internal os (in lower uterin              part) ---------------------------------------------------------------------- Gestational Age  LMP:           12w 6d        Date:  05/18/19                 EDD:   02/22/20  Best:          [place]21w 6d     Det. By:  U/S  (06/24/19)          EDD:   12/21/19 ---------------------------------------------------------------------- Anatomy  Ventricles:            Appears normal         Stomach:                Appears normal, left                                                                        sided  Cerebellum:            Appears normal         Kidneys:                Appear normal  Posterior Fossa:       Appears normal         Bladder:                Appears normal ---------------------------------------------------------------------- Cervix Uterus Adnexa  Cervix  Length:           6.37  cm.  Normal appearance by transabdominal scan.  Uterus  No abnormality visualized.  Left Ovary  No adnexal mass visualized.  Right Ovary  No adnexal mass visualized.  Cul De Sac  No free fluid seen. ---------------------------------------------------------------------- Impression  Vagial bleeding  Limited exam  Suggestive of heterogeneous subchorionic hemorrohage  adjacent to the cervix. ---------------------------------------------------------------------- Recommendations  Consider repeat growth in 1 week. ----------------------------------------------------------------------               Lin Landsman, MD Electronically Signed Final Report   08/17/2019 01:59 pm ----------------------------------------------------------------------   Assessment and Plan:  Pregnancy: X3G1829 at [redacted]w[redacted]d 1. Antepartum bleeding, second trimester Reassured by brown  discharge, bleeding precautions reviewed. Follow up scan as per MFM.   2. Supervision of high risk pregnancy, antepartum Preterm labor symptoms and general obstetric precautions including but not limited to vaginal bleeding, contractions, leaking of fluid and fetal movement were reviewed in detail with the patient. Please refer to After Visit Summary for other counseling recommendations.   Return in about 4 weeks (around 10/02/2019) for 2 hr GTT, 3rd trimester labs, TDap, OFFICE HOB Visit.  Future Appointments  Date Time Provider Department Center  09/10/2019 10:15 AM [facility] HEALTH CLINICIAN [facility] WOC  09/16/2019  9:10 AM [facility] NURSE [facility]-MFC MFC-US  09/16/2019  9:15 AM [facility] [place] 4 WH-MFCUS MFC-US  10/02/2019  8:20 AM [facility] LAB [facility] WOC  10/02/2019  9:15 AM Constant, Gigi Gin, MD [facility] WOC    Jaynie Collins, MD

## 2019-09-07 NOTE — BH Specialist Note (Deleted)
Integrated Behavioral Health via Telemedicine Video Visit  09/07/2019 Yesenia Weeks [id number]  Number of Integrated Behavioral Health visits: 2(4 total) Session Start time: 10:15***  Session End time: 10:45*** Total time: {IBH Total FWYO:37858850}  Referring Provider: Candelaria Celeste, DO Type of Visit: Video Patient/Family location: [facility] Provider location: [facility] All persons participating in visit: Patient *** and Yesenia Weeks ***   Confirmed patient's address: Yes  Confirmed patient's phone number: Yes  Any changes to demographics: No   Confirmed patient's insurance: Yes  Any changes to patient's insurance: No   Discussed confidentiality: at previous visit  I connected with Yesenia Weeks by a video enabled telemedicine application and verified that I am speaking with the correct person using two identifiers.     I discussed the limitations of evaluation and management by telemedicine and the availability of in person appointments.  I discussed that the purpose of this visit is to provide behavioral health care while limiting exposure to the novel coronavirus.   Discussed there is a possibility of technology failure and discussed alternative modes of communication if that failure occurs.  I discussed that engaging in this video visit, they consent to the provision of behavioral healthcare and the services will be billed under their insurance.  Patient and/or legal guardian expressed understanding and consented to video visit: Yes   PRESENTING CONCERNS: Patient and/or family reports the following symptoms/concerns: *** Duration of problem: Current pregnancy; Severity of problem: {Mild/Moderate/Severe:20260}  STRENGTHS (Protective Factors/Coping Skills): Good social support ***  GOALS ADDRESSED: Patient will: 1.  Reduce symptoms of: {IBH Symptoms:21014056}  2.  Increase knowledge and/or ability of: {IBH Patient Tools:21014057}  3.  Demonstrate ability to: {IBH  Goals:21014053}  INTERVENTIONS: Interventions utilized:  {IBH Interventions:21014054} Standardized Assessments completed: {IBH Screening Tools:21014051}  ASSESSMENT: Patient currently experiencing Adjustment disorder with anxiety and depression***.   Patient may benefit from continued psychoeducation and brief therapeutic interventions regarding coping with symptoms of *** .  PLAN: 1. Follow up with behavioral health clinician on : *** 2. Behavioral recommendations:  -*** -***(prenatal, healthy meals/sleep, apps(*** 3. Referral(s): {IBH Referrals:21014055}  I discussed the assessment and treatment plan with the patient and/or parent/guardian. They were provided an opportunity to ask questions and all were answered. They agreed with the plan and demonstrated an understanding of the instructions.   They were advised to call back or seek an in-person evaluation if the symptoms worsen or if the condition fails to improve as anticipated.  Yesenia Weeks

## 2019-09-10 ENCOUNTER — Ambulatory Visit: Payer: Medicaid Other

## 2019-09-16 ENCOUNTER — Encounter (HOSPITAL_COMMUNITY): Payer: Self-pay

## 2019-09-16 ENCOUNTER — Other Ambulatory Visit (HOSPITAL_COMMUNITY): Payer: Self-pay | Admitting: *Deleted

## 2019-09-16 ENCOUNTER — Ambulatory Visit (HOSPITAL_COMMUNITY): Payer: Medicaid Other | Admitting: *Deleted

## 2019-09-16 ENCOUNTER — Ambulatory Visit (HOSPITAL_COMMUNITY)
Admission: RE | Admit: 2019-09-16 | Discharge: 2019-09-16 | Disposition: A | Discharge status: Home / Self Care | Payer: Medicaid Other | Source: Ambulatory Visit | Attending: Obstetrics and Gynecology | Admitting: Obstetrics and Gynecology

## 2019-09-16 ENCOUNTER — Other Ambulatory Visit: Payer: Self-pay

## 2019-09-16 DIAGNOSIS — O4592 Premature separation of placenta, unspecified, second trimester: Secondary | ICD-10-CM | POA: Diagnosis not present

## 2019-09-16 DIAGNOSIS — Z8759 Personal history of other complications of pregnancy, childbirth and the puerperium: Secondary | ICD-10-CM | POA: Diagnosis present

## 2019-09-16 DIAGNOSIS — O09522 Supervision of elderly multigravida, second trimester: Secondary | ICD-10-CM | POA: Diagnosis present

## 2019-09-16 DIAGNOSIS — F1911 Other psychoactive substance abuse, in remission: Secondary | ICD-10-CM | POA: Diagnosis present

## 2019-09-16 DIAGNOSIS — Z362 Encounter for other antenatal screening follow-up: Secondary | ICD-10-CM | POA: Diagnosis not present

## 2019-09-16 DIAGNOSIS — O0990 Supervision of high risk pregnancy, unspecified, unspecified trimester: Secondary | ICD-10-CM | POA: Diagnosis present

## 2019-09-16 DIAGNOSIS — O09292 Supervision of pregnancy with other poor reproductive or obstetric history, second trimester: Secondary | ICD-10-CM | POA: Diagnosis present

## 2019-09-16 DIAGNOSIS — Z3A26 26 weeks gestation of pregnancy: Secondary | ICD-10-CM

## 2019-09-16 DIAGNOSIS — O09293 Supervision of pregnancy with other poor reproductive or obstetric history, third trimester: Secondary | ICD-10-CM

## 2019-09-21 ENCOUNTER — Other Ambulatory Visit: Payer: Self-pay

## 2019-09-21 ENCOUNTER — Ambulatory Visit: Payer: Medicaid Other | Admitting: Clinical

## 2019-09-21 DIAGNOSIS — Z5329 Procedure and treatment not carried out because of patient's decision for other reasons: Secondary | ICD-10-CM

## 2019-09-21 DIAGNOSIS — Z91199 Patient's noncompliance with other medical treatment and regimen due to unspecified reason: Secondary | ICD-10-CM

## 2019-09-21 NOTE — BH Specialist Note (Signed)
Pt did not arrive to video visit and did not answer the phone ; Left HIPPA-compliant message to call back Yesenia Weeks from [facility] at [phone].  ; left MyChart message for patient.    Integrated Behavioral Health via Telemedicine Video Visit  09/21/2019 Yesenia Weeks [id number]  [name]

## 2019-09-24 ENCOUNTER — Telehealth: Payer: Self-pay | Admitting: *Deleted

## 2019-09-24 NOTE — Telephone Encounter (Signed)
Pt left VM message stating she is 27wks and 2 days pregnant. She wants to know if it is normal to feel vaginal pressure @ this point in pregnancy. It has been going on for over 1 hour. Pt also stated that she had recent partial placental abruption. She requests a call back.

## 2019-09-24 NOTE — Telephone Encounter (Addendum)
I called Yesenia Weeks and left a message I am calling to discuss your phone message- please call us back . Anthoni Geerts,RN

## 2019-09-25 ENCOUNTER — Telehealth: Payer: Self-pay | Admitting: Obstetrics and Gynecology

## 2019-09-25 NOTE — Telephone Encounter (Signed)
Voice message left notifying patient about changes made in the office. Patient was asked to call office for detailed information.

## 2019-09-29 NOTE — Telephone Encounter (Signed)
Called patient back in regards to her earlier call about pelvic pressure. Left message for patient to call the office and check her My Chart messages. My Chart message sent.

## 2019-10-01 ENCOUNTER — Other Ambulatory Visit: Payer: Self-pay

## 2019-10-01 DIAGNOSIS — O0990 Supervision of high risk pregnancy, unspecified, unspecified trimester: Secondary | ICD-10-CM

## 2019-10-02 ENCOUNTER — Other Ambulatory Visit: Payer: Medicaid Other

## 2019-10-02 ENCOUNTER — Encounter: Payer: Medicaid Other | Admitting: Obstetrics and Gynecology

## 2019-10-05 ENCOUNTER — Other Ambulatory Visit: Payer: Medicaid Other

## 2019-10-05 ENCOUNTER — Encounter: Payer: Medicaid Other | Admitting: Family Medicine

## 2019-10-07 ENCOUNTER — Other Ambulatory Visit: Payer: Self-pay

## 2019-10-07 ENCOUNTER — Other Ambulatory Visit: Payer: Medicaid Other

## 2019-10-07 ENCOUNTER — Ambulatory Visit (INDEPENDENT_AMBULATORY_CARE_PROVIDER_SITE_OTHER): Payer: Medicaid Other | Admitting: Family Medicine

## 2019-10-07 VITALS — BP 118/75 | HR 93 | Wt 204.0 lb

## 2019-10-07 DIAGNOSIS — Z3A29 29 weeks gestation of pregnancy: Secondary | ICD-10-CM

## 2019-10-07 DIAGNOSIS — G5600 Carpal tunnel syndrome, unspecified upper limb: Secondary | ICD-10-CM

## 2019-10-07 DIAGNOSIS — Z23 Encounter for immunization: Secondary | ICD-10-CM | POA: Diagnosis not present

## 2019-10-07 DIAGNOSIS — O09522 Supervision of elderly multigravida, second trimester: Secondary | ICD-10-CM

## 2019-10-07 DIAGNOSIS — O0990 Supervision of high risk pregnancy, unspecified, unspecified trimester: Secondary | ICD-10-CM

## 2019-10-07 DIAGNOSIS — O26899 Other specified pregnancy related conditions, unspecified trimester: Secondary | ICD-10-CM

## 2019-10-07 DIAGNOSIS — O0993 Supervision of high risk pregnancy, unspecified, third trimester: Secondary | ICD-10-CM

## 2019-10-07 DIAGNOSIS — I83812 Varicose veins of left lower extremities with pain: Secondary | ICD-10-CM

## 2019-10-07 MED ORDER — KNEE COMPRESSION SLEEVE/L/XL MISC: 1.0000 | 0 refills | Status: DC | PRN

## 2019-10-07 MED ORDER — COMFORT FIT MATERNITY SUPP LG MISC: 1.0000 | 0 refills | Status: DC | PRN

## 2019-10-07 MED ORDER — WRIST SPLINT/COCK-UP/LEFT M MISC: 1.0000 | 0 refills | Status: DC | PRN

## 2019-10-07 NOTE — Patient Instructions (Addendum)
https://www.cdc.gov/vaccines/hcp/vis/vis-statements/tdap.pdf">  Tdap (Tetanus, Diphtheria, Pertussis) Vaccine: What You Need to Know 1. Why get vaccinated? Tdap vaccine can prevent tetanus, diphtheria, and pertussis. Diphtheria and pertussis spread from person to person. Tetanus enters the body through cuts or wounds.  TETANUS (T) causes painful stiffening of the muscles. Tetanus can lead to serious health problems, including being unable to open the mouth, having trouble swallowing and breathing, or death.  DIPHTHERIA (D) can lead to difficulty breathing, heart failure, paralysis, or death.  PERTUSSIS (aP), also known as "whooping cough," can cause uncontrollable, violent coughing which makes it hard to breathe, eat, or drink. Pertussis can be extremely serious in babies and young children, causing pneumonia, convulsions, brain damage, or death. In teens and adults, it can cause weight loss, loss of bladder control, passing out, and rib fractures from severe coughing. 2. Tdap vaccine Tdap is only for children 7 years and older, adolescents, and adults.  Adolescents should receive a single dose of Tdap, preferably at age 53 or 35 years. Pregnant women should get a dose of Tdap during every pregnancy, to protect the newborn from pertussis. Infants are most at risk for severe, life-threatening complications from pertussis. Adults who have never received Tdap should get a dose of Tdap. Also, adults should receive a booster dose every 10 years, or earlier in the case of a severe and dirty wound or burn. Booster doses can be either Tdap or Td (a different vaccine that protects against tetanus and diphtheria but not pertussis). Tdap may be given at the same time as other vaccines. 3. Talk with your health care provider Tell your vaccine provider if the person getting the vaccine:  Has had an allergic reaction after a previous dose of any vaccine that protects against tetanus, diphtheria, or pertussis,  or has any severe, life-threatening allergies.  Has had a coma, decreased level of consciousness, or prolonged seizures within 7 days after a previous dose of any pertussis vaccine (DTP, DTaP, or Tdap).  Has seizures or another nervous system problem.  Has ever had Guillain-Barr Syndrome (also called GBS).  Has had severe pain or swelling after a previous dose of any vaccine that protects against tetanus or diphtheria. In some cases, your health care provider may decide to postpone Tdap vaccination to a future visit.  People with minor illnesses, such as a cold, may be vaccinated. People who are moderately or severely ill should usually wait until they recover before getting Tdap vaccine.  Your health care provider can give you more information. 4. Risks of a vaccine reaction  Pain, redness, or swelling where the shot was given, mild fever, headache, feeling tired, and nausea, vomiting, diarrhea, or stomachache sometimes happen after Tdap vaccine. People sometimes faint after medical procedures, including vaccination. Tell your provider if you feel dizzy or have vision changes or ringing in the ears.  As with any medicine, there is a very remote chance of a vaccine causing a severe allergic reaction, other serious injury, or death. 5. What if there is a serious problem? An allergic reaction could occur after the vaccinated person leaves the clinic. If you see signs of a severe allergic reaction (hives, swelling of the face and throat, difficulty breathing, a fast heartbeat, dizziness, or weakness), call 9-1-1 and get the person to the nearest hospital. For other signs that concern you, call your health care provider.  Adverse reactions should be reported to the Vaccine Adverse Event Reporting System (VAERS). Your health care provider will usually file this report,  or you can do it yourself. Visit the VAERS website at www.vaers.LAgents.no or call 724-677-4213. VAERS is only for reporting  reactions, and VAERS staff do not give medical advice. 6. The National Vaccine Injury Compensation Program The [employer] Vaccine Injury Compensation Program (VICP) is a federal program that was created to compensate people who may have been injured by certain vaccines. Visit the VICP website at SpiritualWord.at or call 323-079-0181 to learn about the program and about filing a claim. There is a time limit to file a claim for compensation. 7. How can I learn more?  Ask your health care provider.  Call your local or state health department.  Contact the Centers for Disease Control and Prevention (CDC): ? Call (947) 141-1869 (1-800-CDC-INFO) or ? Visit CDC's website at PicCapture.uy Vaccine Information Statement Tdap (Tetanus, Diphtheria, Pertussis) Vaccine (10/22/2018) This information is not intended to replace advice given to you by your health care provider. Make sure you discuss any questions you have with your health care provider. Document Revised: 10/31/2018 Document Reviewed: 11/03/2018 Elsevier Patient Education  2020 [employer].   Breastfeeding  Choosing to breastfeed is one of the best decisions you can make for yourself and your baby. A change in hormones during pregnancy causes your breasts to make breast milk in your milk-producing glands. Hormones prevent breast milk from being released before your baby is born. They also prompt milk flow after birth. Once breastfeeding has begun, thoughts of your baby, as well as his or her sucking or crying, can stimulate the release of milk from your milk-producing glands. Benefits of breastfeeding Research shows that breastfeeding offers many health benefits for infants and mothers. It also offers a cost-free and convenient way to feed your baby. For your baby  Your first milk (colostrum) helps your baby's digestive system to function better.  Special cells in your milk (antibodies) help your baby to fight off  infections.  Breastfed babies are less likely to develop asthma, allergies, obesity, or type 2 diabetes. They are also at lower risk for sudden infant death syndrome (SIDS).  Nutrients in breast milk are better able to meet your baby's needs compared to infant formula.  Breast milk improves your baby's brain development. For you  Breastfeeding helps to create a very special bond between you and your baby.  Breastfeeding is convenient. Breast milk costs nothing and is always available at the correct temperature.  Breastfeeding helps to burn calories. It helps you to lose the weight that you gained during pregnancy.  Breastfeeding makes your uterus return faster to its size before pregnancy. It also slows bleeding (lochia) after you give birth.  Breastfeeding helps to lower your risk of developing type 2 diabetes, osteoporosis, rheumatoid arthritis, cardiovascular disease, and breast, ovarian, uterine, and endometrial cancer later in life. Breastfeeding basics Starting breastfeeding  Find a comfortable place to sit or lie down, with your neck and back well-supported.  Place a pillow or a rolled-up blanket under your baby to bring him or her to the level of your breast (if you are seated). Nursing pillows are specially designed to help support your arms and your baby while you breastfeed.  Make sure that your baby's tummy (abdomen) is facing your abdomen.  Gently massage your breast. With your fingertips, massage from the outer edges of your breast inward toward the nipple. This encourages milk flow. If your milk flows slowly, you may need to continue this action during the feeding.  Support your breast with 4 fingers underneath and your thumb  above your nipple (make the letter "C" with your hand). Make sure your fingers are well away from your nipple and your baby's mouth.  Stroke your baby's lips gently with your finger or nipple.  When your baby's mouth is open wide enough, quickly  bring your baby to your breast, placing your entire nipple and as much of the areola as possible into your baby's mouth. The areola is the colored area around your nipple. ? More areola should be visible above your baby's upper lip than below the lower lip. ? Your baby's lips should be opened and extended outward (flanged) to ensure an adequate, comfortable latch. ? Your baby's tongue should be between his or her lower gum and your breast.  Make sure that your baby's mouth is correctly positioned around your nipple (latched). Your baby's lips should create a seal on your breast and be turned out (everted).  It is common for your baby to suck about 2-3 minutes in order to start the flow of breast milk. Latching Teaching your baby how to latch onto your breast properly is very important. An improper latch can cause nipple pain, decreased milk supply, and poor weight gain in your baby. Also, if your baby is not latched onto your nipple properly, he or she may swallow some air during feeding. This can make your baby fussy. Burping your baby when you switch breasts during the feeding can help to get rid of the air. However, teaching your baby to latch on properly is still the best way to prevent fussiness from swallowing air while breastfeeding. Signs that your baby has successfully latched onto your nipple  Silent tugging or silent sucking, without causing you pain. Infant's lips should be extended outward (flanged).  Swallowing heard between every 3-4 sucks once your milk has started to flow (after your let-down milk reflex occurs).  Muscle movement above and in front of his or her ears while sucking. Signs that your baby has not successfully latched onto your nipple  Sucking sounds or smacking sounds from your baby while breastfeeding.  Nipple pain. If you think your baby has not latched on correctly, slip your finger into the corner of your baby's mouth to break the suction and place it between  your baby's gums. Attempt to start breastfeeding again. Signs of successful breastfeeding Signs from your baby  Your baby will gradually decrease the number of sucks or will completely stop sucking.  Your baby will fall asleep.  Your baby's body will relax.  Your baby will retain a small amount of milk in his or her mouth.  Your baby will let go of your breast by himself or herself. Signs from you  Breasts that have increased in firmness, weight, and size 1-3 hours after feeding.  Breasts that are softer immediately after breastfeeding.  Increased milk volume, as well as a change in milk consistency and color by the fifth day of breastfeeding.  Nipples that are not sore, cracked, or bleeding. Signs that your baby is getting enough milk  Wetting at least 1-2 diapers during the first 24 hours after birth.  Wetting at least 5-6 diapers every 24 hours for the first week after birth. The urine should be clear or pale yellow by the age of 5 days.  Wetting 6-8 diapers every 24 hours as your baby continues to grow and develop.  At least 3 stools in a 24-hour period by the age of 5 days. The stool should be soft and yellow.  At  least 3 stools in a 24-hour period by the age of 7 days. The stool should be seedy and yellow.  No loss of weight greater than 10% of birth weight during the first 3 days of life.  Average weight gain of 4-7 oz (113-198 g) per week after the age of 4 days.  Consistent daily weight gain by the age of 5 days, without weight loss after the age of 2 weeks. After a feeding, your baby may spit up a small amount of milk. This is normal. Breastfeeding frequency and duration Frequent feeding will help you make more milk and can prevent sore nipples and extremely full breasts (breast engorgement). Breastfeed when you feel the need to reduce the fullness of your breasts or when your baby shows signs of hunger. This is called "breastfeeding on demand." Signs that your baby  is hungry include:  Increased alertness, activity, or restlessness.  Movement of the head from side to side.  Opening of the mouth when the corner of the mouth or cheek is stroked (rooting).  Increased sucking sounds, smacking lips, cooing, sighing, or squeaking.  Hand-to-mouth movements and sucking on fingers or hands.  Fussing or crying. Avoid introducing a pacifier to your baby in the first 4-6 weeks after your baby is born. After this time, you may choose to use a pacifier. Research has shown that pacifier use during the first year of a baby's life decreases the risk of sudden infant death syndrome (SIDS). Allow your baby to feed on each breast as long as he or she wants. When your baby unlatches or falls asleep while feeding from the first breast, offer the second breast. Because newborns are often sleepy in the first few weeks of life, you may need to awaken your baby to get him or her to feed. Breastfeeding times will vary from baby to baby. However, the following rules can serve as a guide to help you make sure that your baby is properly fed:  Newborns (babies 17 weeks of age or younger) may breastfeed every 1-3 hours.  Newborns should not go without breastfeeding for longer than 3 hours during the day or 5 hours during the night.  You should breastfeed your baby a minimum of 8 times in a 24-hour period. Breast milk pumping     Pumping and storing breast milk allows you to make sure that your baby is exclusively fed your breast milk, even at times when you are unable to breastfeed. This is especially important if you go back to work while you are still breastfeeding, or if you are not able to be present during feedings. Your lactation consultant can help you find a method of pumping that works best for you and give you guidelines about how long it is safe to store breast milk. Caring for your breasts while you breastfeed Nipples can become dry, cracked, and sore while  breastfeeding. The following recommendations can help keep your breasts moisturized and healthy:  Avoid using soap on your nipples.  Wear a supportive bra designed especially for nursing. Avoid wearing underwire-style bras or extremely tight bras (sports bras).  Air-dry your nipples for 3-4 minutes after each feeding.  Use only cotton bra pads to absorb leaked breast milk. Leaking of breast milk between feedings is normal.  Use lanolin on your nipples after breastfeeding. Lanolin helps to maintain your skin's normal moisture barrier. Pure lanolin is not harmful (not toxic) to your baby. You may also hand express a few drops of breast  milk and gently massage that milk into your nipples and allow the milk to air-dry. In the first few weeks after giving birth, some women experience breast engorgement. Engorgement can make your breasts feel heavy, warm, and tender to the touch. Engorgement peaks within 3-5 days after you give birth. The following recommendations can help to ease engorgement:  Completely empty your breasts while breastfeeding or pumping. You may want to start by applying warm, moist heat (in the shower or with warm, water-soaked hand towels) just before feeding or pumping. This increases circulation and helps the milk flow. If your baby does not completely empty your breasts while breastfeeding, pump any extra milk after he or she is finished.  Apply ice packs to your breasts immediately after breastfeeding or pumping, unless this is too uncomfortable for you. To do this: ? Put ice in a plastic bag. ? Place a towel between your skin and the bag. ? Leave the ice on for 20 minutes, 2-3 times a day.  Make sure that your baby is latched on and positioned properly while breastfeeding. If engorgement persists after 48 hours of following these recommendations, contact your health care provider or a Advertising copywriterlactation consultant. Overall health care recommendations while breastfeeding  Eat 3  healthy meals and 3 snacks every day. Well-nourished mothers who are breastfeeding need an additional 450-500 calories a day. You can meet this requirement by increasing the amount of a balanced diet that you eat.  Drink enough water to keep your urine pale yellow or clear.  Rest often, relax, and continue to take your prenatal vitamins to prevent fatigue, stress, and low vitamin and mineral levels in your body (nutrient deficiencies).  Do not use any products that contain nicotine or tobacco, such as cigarettes and e-cigarettes. Your baby may be harmed by chemicals from cigarettes that pass into breast milk and exposure to secondhand smoke. If you need help quitting, ask your health care provider.  Avoid alcohol.  Do not use illegal drugs or marijuana.  Talk with your health care provider before taking any medicines. These include over-the-counter and prescription medicines as well as vitamins and herbal supplements. Some medicines that may be harmful to your baby can pass through breast milk.  It is possible to become pregnant while breastfeeding. If birth control is desired, ask your health care provider about options that will be safe while breastfeeding your baby. Where to find more information: [employer]: www.llli.org Contact a health care provider if:  You feel like you want to stop breastfeeding or have become frustrated with breastfeeding.  Your nipples are cracked or bleeding.  Your breasts are red, tender, or warm.  You have: ? Painful breasts or nipples. ? A swollen area on either breast. ? A fever or chills. ? Nausea or vomiting. ? Drainage other than breast milk from your nipples.  Your breasts do not become full before feedings by the fifth day after you give birth.  You feel sad and depressed.  Your baby is: ? Too sleepy to eat well. ? Having trouble sleeping. ? More than 811 week old and wetting fewer than 6 diapers in a 24-hour period. ? Not  gaining weight by 625 days of age.  Your baby has fewer than 3 stools in a 24-hour period.  Your baby's skin or the white parts of his or her eyes become yellow. Get help right away if:  Your baby is overly tired (lethargic) and does not want to wake up and feed.  Your baby develops an unexplained fever. Summary  Breastfeeding offers many health benefits for infant and mothers.  Try to breastfeed your infant when he or she shows early signs of hunger.  Gently tickle or stroke your baby's lips with your finger or nipple to allow the baby to open his or her mouth. Bring the baby to your breast. Make sure that much of the areola is in your baby's mouth. Offer one side and burp the baby before you offer the other side.  Talk with your health care provider or lactation consultant if you have questions or you face problems as you breastfeed. This information is not intended to replace advice given to you by your health care provider. Make sure you discuss any questions you have with your health care provider. Document Revised: 10/03/2017 Document Reviewed: 08/10/2016 Elsevier Patient Education  [place].

## 2019-10-07 NOTE — Progress Notes (Signed)
   PRENATAL VISIT NOTE  Subjective:  Yesenia Weeks is a 44 y.o. [id number] at [redacted]w[redacted]d being seen today for ongoing prenatal care.  She is currently monitored for the following issues for this high-risk pregnancy and has ANEMIA, IRON DEFICIENCY, UNSPEC.; Mood disorder (HCC); Allergic rhinitis; ACNE, MILD; ASTEATOTIC ECZEMA; HALLUX VALGUS, ACQUIRED; Genital herpes; Family history of breast cancer; Anxiety state; Tenosynovitis, de Quervain; Bacterial vaginosis; Cold sore; Exposure to COVID-19 virus; Canker sores oral; Supervision of high risk pregnancy, antepartum; Depression; AMA (advanced maternal age) multigravida 35+; History of IUFD; History of prior pregnancy with SGA newborn; History of substance abuse (HCC); and Antepartum bleeding, second trimester on their problem list.  Patient reports no complaints.  Contractions: Irritability.  .  Movement: Present. Denies leaking of fluid.   The following portions of the patient's history were reviewed and updated as appropriate: allergies, current medications, past family history, past medical history, past social history, past surgical history and problem list.   Objective:   Vitals:   10/07/19 0919  BP: 118/75  Pulse: 93  Weight: 204 lb (92.5 kg)    Fetal Status: Fetal Heart Rate (bpm): 127   Movement: Present     General:  Alert, oriented and cooperative. Patient is in no acute distress.  Skin: Skin is warm and dry. No rash noted.   Cardiovascular: Normal heart rate noted  Respiratory: Normal respiratory effort, no problems with respiration noted  Abdomen: Soft, gravid, appropriate for gestational age.  Pain/Pressure: Present     Pelvic: Cervical exam deferred        Extremities: Normal range of motion.  Edema: None  Mental Status: Normal mood and affect. Normal behavior. Normal judgment and thought content.   Assessment and Plan:  Pregnancy: [id number] at [redacted]w[redacted]d 1. Supervision of high risk pregnancy, antepartum 28 wk labs and TDaP - Tdap  vaccine greater than or equal to 7yo IM - Elastic Bandages & Supports (COMFORT FIT MATERNITY SUPP LG) MISC; 1 Device by Does not apply route as needed.  Dispense: 1 each; Refill: 0  2. Multigravida of advanced maternal age in second trimester Will need testing at 36 wks.  3. Carpal tunnel syndrome during pregnancy Trial of wrist splints - Elastic Bandages & Supports (WRIST SPLINT/COCK-UP/LEFT M) MISC; 1 Device by Does not apply route as needed.  Dispense: 1 each; Refill: 0  4. Varicose veins of left lower extremity with pain Has varicose veins--discussed options. - Elastic Bandages & Supports (KNEE COMPRESSION SLEEVE/L/XL) MISC; 1 Device by Does not apply route as needed.  Dispense: 1 each; Refill: 0  Preterm labor symptoms and general obstetric precautions including but not limited to vaginal bleeding, contractions, leaking of fluid and fetal movement were reviewed in detail with the patient. Please refer to After Visit Summary for other counseling recommendations.   Return in 2 weeks (on 10/21/2019) for [facility], virtual.  Future Appointments  Date Time Provider Department Center  10/15/2019 10:15 AM [facility] NURSE [facility]  10/15/2019 10:15 AM [facility] [place] 4 [facility]  10/22/2019  8:55 AM Hermina Staggers, MD [facility]    Reva Bores, MD

## 2019-10-07 NOTE — Progress Notes (Signed)
Patient reports light brown discharge daily

## 2019-10-08 ENCOUNTER — Encounter: Payer: Self-pay | Admitting: *Deleted

## 2019-10-08 ENCOUNTER — Telehealth: Payer: Self-pay | Admitting: Obstetrics and Gynecology

## 2019-10-08 ENCOUNTER — Other Ambulatory Visit: Payer: Self-pay | Admitting: *Deleted

## 2019-10-08 DIAGNOSIS — O24419 Gestational diabetes mellitus in pregnancy, unspecified control: Secondary | ICD-10-CM | POA: Insufficient documentation

## 2019-10-08 HISTORY — DX: Gestational diabetes mellitus in pregnancy, unspecified control: O24.419

## 2019-10-08 LAB — GLUCOSE TOLERANCE, 2 HOURS W/ 1HR
Glucose, 1 hour: 164 mg/dL (ref 65–179)
Glucose, 2 hour: 110 mg/dL (ref 65–152)
Glucose, Fasting: 92 mg/dL — ABNORMAL HIGH (ref 65–91)

## 2019-10-08 LAB — CBC
Hematocrit: 31.1 % — ABNORMAL LOW (ref 34.0–46.6)
Hemoglobin: 10.1 g/dL — ABNORMAL LOW (ref 11.1–15.9)
MCH: 26.1 pg — ABNORMAL LOW (ref 26.6–33.0)
MCHC: 32.5 g/dL (ref 31.5–35.7)
MCV: 80 fL (ref 79–97)
Platelets: 218 10*3/uL (ref 150–450)
RBC: 3.87 x10E6/uL (ref 3.77–5.28)
RDW: 13.1 % (ref 11.7–15.4)
WBC: 8.1 10*3/uL (ref 3.4–10.8)

## 2019-10-08 LAB — RPR: RPR Ser Ql: NONREACTIVE

## 2019-10-08 LAB — HIV ANTIBODY (ROUTINE TESTING W REFLEX): HIV Screen 4th Generation wRfx: NONREACTIVE

## 2019-10-08 MED ORDER — BLOOD GLUCOSE MONITOR KIT: PACK | 0 refills | Status: DC

## 2019-10-08 MED ORDER — GLUCOSE BLOOD VI STRP: ORAL_STRIP | 12 refills | Status: DC

## 2019-10-08 MED ORDER — ACCU-CHEK SOFTCLIX LANCETS MISC: 12 refills | Status: DC

## 2019-10-08 NOTE — Telephone Encounter (Signed)
Call returned to pt and she did not answer - no VM available. MyChart message sent to pt stating that she will be scheduled for an appointment with the Diabetes educator in order to learn the proper diet as well as how to check her blood sugar.

## 2019-10-08 NOTE — Telephone Encounter (Signed)
The patient called in stating she test positive for diabetes. She would like to know what she can and cant eat. She stated she would like a call back from a nurse.

## 2019-10-09 ENCOUNTER — Other Ambulatory Visit: Payer: Self-pay

## 2019-10-09 DIAGNOSIS — O24419 Gestational diabetes mellitus in pregnancy, unspecified control: Secondary | ICD-10-CM

## 2019-10-09 MED ORDER — ACCU-CHEK GUIDE W/DEVICE KIT: 1.0000 | PACK | Freq: Once | 12 refills | Status: AC

## 2019-10-09 NOTE — Progress Notes (Signed)
accu

## 2019-10-12 ENCOUNTER — Encounter (HOSPITAL_COMMUNITY): Payer: Self-pay

## 2019-10-12 ENCOUNTER — Other Ambulatory Visit: Payer: Self-pay | Admitting: *Deleted

## 2019-10-12 DIAGNOSIS — O24419 Gestational diabetes mellitus in pregnancy, unspecified control: Secondary | ICD-10-CM

## 2019-10-13 ENCOUNTER — Other Ambulatory Visit: Payer: Medicaid Other

## 2019-10-15 ENCOUNTER — Encounter: Payer: Medicaid Other | Attending: Family Medicine | Admitting: Registered"

## 2019-10-15 ENCOUNTER — Ambulatory Visit: Payer: Medicaid Other | Admitting: Registered"

## 2019-10-15 ENCOUNTER — Inpatient Hospital Stay (HOSPITAL_COMMUNITY)
Admission: AD | Admit: 2019-10-15 | Discharge: 2019-10-15 | Disposition: A | Discharge status: Home / Self Care | Payer: Medicaid Other | Attending: Obstetrics and Gynecology | Admitting: Obstetrics and Gynecology

## 2019-10-15 ENCOUNTER — Ambulatory Visit (HOSPITAL_COMMUNITY)
Admission: RE | Admit: 2019-10-15 | Discharge: 2019-10-15 | Disposition: A | Discharge status: Home / Self Care | Payer: Medicaid Other | Source: Ambulatory Visit | Attending: Obstetrics and Gynecology | Admitting: Obstetrics and Gynecology

## 2019-10-15 ENCOUNTER — Encounter (HOSPITAL_COMMUNITY): Payer: Self-pay | Admitting: Obstetrics and Gynecology

## 2019-10-15 ENCOUNTER — Other Ambulatory Visit (HOSPITAL_COMMUNITY): Payer: Self-pay | Admitting: *Deleted

## 2019-10-15 ENCOUNTER — Ambulatory Visit (HOSPITAL_COMMUNITY): Payer: Medicaid Other | Admitting: *Deleted

## 2019-10-15 ENCOUNTER — Other Ambulatory Visit: Payer: Self-pay

## 2019-10-15 ENCOUNTER — Encounter (HOSPITAL_COMMUNITY): Payer: Self-pay

## 2019-10-15 DIAGNOSIS — O24419 Gestational diabetes mellitus in pregnancy, unspecified control: Secondary | ICD-10-CM | POA: Insufficient documentation

## 2019-10-15 DIAGNOSIS — O4703 False labor before 37 completed weeks of gestation, third trimester: Secondary | ICD-10-CM | POA: Diagnosis present

## 2019-10-15 DIAGNOSIS — Z3689 Encounter for other specified antenatal screening: Secondary | ICD-10-CM

## 2019-10-15 DIAGNOSIS — F1911 Other psychoactive substance abuse, in remission: Secondary | ICD-10-CM | POA: Diagnosis present

## 2019-10-15 DIAGNOSIS — O24410 Gestational diabetes mellitus in pregnancy, diet controlled: Secondary | ICD-10-CM

## 2019-10-15 DIAGNOSIS — Z3A30 30 weeks gestation of pregnancy: Secondary | ICD-10-CM

## 2019-10-15 DIAGNOSIS — Z8759 Personal history of other complications of pregnancy, childbirth and the puerperium: Secondary | ICD-10-CM | POA: Insufficient documentation

## 2019-10-15 DIAGNOSIS — O0990 Supervision of high risk pregnancy, unspecified, unspecified trimester: Secondary | ICD-10-CM

## 2019-10-15 DIAGNOSIS — Z3A00 Weeks of gestation of pregnancy not specified: Secondary | ICD-10-CM | POA: Insufficient documentation

## 2019-10-15 DIAGNOSIS — O09293 Supervision of pregnancy with other poor reproductive or obstetric history, third trimester: Secondary | ICD-10-CM | POA: Diagnosis present

## 2019-10-15 DIAGNOSIS — Z362 Encounter for other antenatal screening follow-up: Secondary | ICD-10-CM | POA: Diagnosis not present

## 2019-10-15 DIAGNOSIS — O09213 Supervision of pregnancy with history of pre-term labor, third trimester: Secondary | ICD-10-CM

## 2019-10-15 DIAGNOSIS — O09529 Supervision of elderly multigravida, unspecified trimester: Secondary | ICD-10-CM | POA: Diagnosis not present

## 2019-10-15 DIAGNOSIS — Z713 Dietary counseling and surveillance: Secondary | ICD-10-CM | POA: Diagnosis present

## 2019-10-15 DIAGNOSIS — O09523 Supervision of elderly multigravida, third trimester: Secondary | ICD-10-CM

## 2019-10-15 DIAGNOSIS — Z87891 Personal history of nicotine dependence: Secondary | ICD-10-CM | POA: Diagnosis not present

## 2019-10-15 DIAGNOSIS — O4593 Premature separation of placenta, unspecified, third trimester: Secondary | ICD-10-CM

## 2019-10-15 LAB — URINALYSIS, ROUTINE W REFLEX MICROSCOPIC
Bilirubin Urine: NEGATIVE
Glucose, UA: NEGATIVE mg/dL
Hgb urine dipstick: NEGATIVE
Ketones, ur: NEGATIVE mg/dL
Leukocytes,Ua: NEGATIVE
Nitrite: NEGATIVE
Protein, ur: NEGATIVE mg/dL
Specific Gravity, Urine: 1.008 (ref 1.005–1.030)
pH: 6 (ref 5.0–8.0)

## 2019-10-15 LAB — WET PREP, GENITAL
Clue Cells Wet Prep HPF POC: NONE SEEN
Sperm: NONE SEEN
Trich, Wet Prep: NONE SEEN
Yeast Wet Prep HPF POC: NONE SEEN

## 2019-10-15 LAB — FETAL FIBRONECTIN: Fetal Fibronectin: NEGATIVE

## 2019-10-15 NOTE — Progress Notes (Signed)
Patient was seen on 10/15/19 for Gestational Diabetes self-management. EDD 12/21/19. Patient states no history of GDM. Diet history obtained. Patient eats variety of all food groups. Beverages include water, 2 cups whole milk.  Patient states she has become very tired and is not sleeping well. Pt states she noticed a note in MyChart about low iron and will address with provider at next appointment to see if she needs to start iron supplement. In the meantime she plans to increase her intake of iron rich foods.  The following learning objectives were met by the patient : Was not able to go into great detail for carb counting d/t time. Will review at follow-up appointment.   States the definition of Gestational Diabetes  States why dietary management is important in controlling blood glucose  Describes the effects of carbohydrates on blood glucose levels  Demonstrates ability to create a balanced meal plan  Demonstrates carbohydrate counting   States when to check blood glucose levels  Demonstrates proper blood glucose monitoring techniques  States the effect of stress and exercise on blood glucose levels  States the importance of limiting caffeine and abstaining from alcohol and smoking  Plan:  Aim for 3 Carbohydrate Choices per meal (45 grams) +/- 1 either way  Aim for 1-2 Carbohydrate Choices per snack Begin reading food labels for Total Carbohydrate of foods If OK with your MD, consider  increasing your activity level by walking, Arm Chair Exercises or other activity daily as tolerated Begin checking Blood Glucose before breakfast and 2 hours after first bite of breakfast, lunch and dinner as directed by MD Patient plans to use [employer] as record of blood glucose electronically  Take medication if directed by MD  Blood glucose monitor given: Accu Chek Guide me Lot # [id number] Exp: 08/13/20 Blood glucose reading: 139 mg/dL Blood glucose monitor Rx called into pharmacy: Accu Check  Guide with Softclix lancets  Patient instructed to monitor glucose levels: FBS: 60 - 95 mg/dl 2 hour: <120 mg/dl  Patient received the following handouts:  Nutrition Diabetes and Pregnancy  Carbohydrate Counting List  Blood glucose Log Sheet  AND Iron MNT  Patient will be seen for follow-up in 1 week.

## 2019-10-15 NOTE — MAU Provider Note (Addendum)
History     CSN: [id number]  Arrival date and time: 10/15/19 1203   First Provider Initiated Contact with Patient 10/15/19 1307      Chief Complaint  Patient presents with  . Contractions   HPI Yesenia Weeks 44 y.o. [redacted]w[redacted]d Sent from [facility].  Was there having an ultrasound today and had several contractions during her visit there.  Dr. SDonalee Citrincalled report but wanted patient evaluated.  She had felt several contractions during the ultrasound.  No intercourse in last 24 hours.  Has noticed some brown discharge.  Fetal monitor applied.  Having less contractions now than earlier.  Having some difficulty tracing contractions.  History of gestationad diabetes and client reports was admitted earlier in the pregnancy for suspected partial abruption.  Reports she has brown discharge from time to time.  Of note - advanced maternal age with history of IUFD and Pregnancy with Small for gestational age newborn, history of vaginal bleeding in second trimester.  OB History    Gravida  5   Para  4   Term  3   Preterm  1   AB  0   Living  3     SAB  0   TAB      Ectopic      Multiple  0   Live Births  3           Past Medical History:  Diagnosis Date  . ADHD (attention deficit hyperactivity disorder)   . Allergy   . Anemia    iron def  . Anxiety   . BV (bacterial vaginosis)    recurrent-uses boric acid  . Depression   . Dysmenorrhea   . Gestational diabetes   . HSV infection   . Preterm labor   . Substance abuse ([facility]     Past Surgical History:  Procedure Laterality Date  . HERNIA REPAIR     x2    Family History  Problem Relation Age of Onset  . Hypertension Mother   . Stroke Mother   . Hypertension Father   . COPD Father   . Cancer Maternal Aunt        breast  . Breast cancer Maternal Aunt   . Diabetes Maternal Grandmother   . Hypertension Maternal Grandmother   . Mental illness Maternal Grandmother   . Cancer Maternal Grandmother   . Breast cancer  Maternal Grandmother   . Diabetes Paternal Grandmother   . Kidney disease Paternal Grandmother   . Mental illness Paternal Grandmother   . Hypertension Paternal Grandmother   . Breast cancer Maternal Aunt     Social History   Tobacco Use  . Smoking status: Former Smoker    Types: Cigarettes    Quit date: 06/22/2019    Years since quitting: 0.3  . Smokeless tobacco: Never Used  Substance Use Topics  . Alcohol use: Not Currently    Comment: occasional until + upt  . Drug use: Not Currently    Types: Cocaine    Comment: last used 2018    Allergies: No Known Allergies  Medications Prior to Admission  Medication Sig Dispense Refill Last Dose  . acetaminophen (TYLENOL) 500 MG tablet Take 500 mg by mouth every 6 (six) hours as needed for mild pain or headache.   Past Week at Unknown time  . albuterol (VENTOLIN HFA) 108 (90 Base) MCG/ACT inhaler INHALE 2 PUFFS INTO THE LUNGS EVERY 6 HOURS FOR UP TO 14 DAYS AS NEEDED FOR WHEEZING OR  SHORTNESS OF BREATH (Patient taking differently: Inhale 2 puffs into the lungs every 6 (six) hours as needed for wheezing or shortness of breath. ) 6.7 g 0 Past Month at Unknown time  . cetirizine (ZYRTEC) 10 MG tablet TAKE 1 TABLET(10 MG) BY MOUTH DAILY AS NEEDED (Patient taking differently: Take 10 mg by mouth daily as needed for allergies. ) 30 tablet 3 10/14/2019 at Unknown time  . fluticasone (FLONASE) 50 MCG/ACT nasal spray Place 1 spray into both nostrils daily. 16 g 3 Past Week at Unknown time  . Prenatal 27-1 MG TABS Take 1 tablet by mouth daily. 90 tablet 3 10/14/2019 at Unknown time  . valACYclovir (VALTREX) 500 MG tablet Take 1 tablet (500 mg total) by mouth 2 (two) times daily. 30 tablet 3 10/14/2019 at Unknown time  . Accu-Chek Softclix Lancets lancets Use as instructed 4 times daily 100 each 12   . aspirin EC 81 MG tablet Take 1 tablet (81 mg total) by mouth daily. Take after 12 weeks for prevention of preeclampsia later in pregnancy (Patient not  taking: Reported on 08/17/2019) 300 tablet 2   . Blood Pressure Monitoring (BLOOD PRESSURE KIT) DEVI 1 Device by Does not apply route as needed. 1 each 0   . Elastic Bandages & Supports (COMFORT FIT MATERNITY SUPP LG) MISC 1 Device by Does not apply route as needed. 1 each 0   . Elastic Bandages & Supports (KNEE COMPRESSION SLEEVE/L/XL) MISC 1 Device by Does not apply route as needed. 1 each 0   . Elastic Bandages & Supports (WRIST SPLINT/COCK-UP/LEFT M) MISC 1 Device by Does not apply route as needed. 1 each 0   . glucose blood test strip Use as instructed 4 times daily 100 each 12     Review of Systems  Constitutional: Negative for fever.  Respiratory: Negative for cough and shortness of breath.   Gastrointestinal: Positive for abdominal pain. Negative for constipation, diarrhea, nausea and vomiting.  Genitourinary: Negative for dysuria, vaginal bleeding and vaginal discharge.   Physical Exam   Blood pressure 138/72, pulse 96, temperature 98.2 F (36.8 C), temperature source Oral, resp. rate 17, height '5\' 7"'  (1.702 m), weight 93.2 kg, last menstrual period 05/18/2019, SpO2 100 %, currently breastfeeding.  Physical Exam  Nursing note and vitals reviewed. Constitutional: She is oriented to person, place, and time. She appears well-developed and well-nourished.  HENT:  Head: Normocephalic.  Eyes: EOM are normal.  GI: Soft. There is no abdominal tenderness. There is no rebound and no guarding.  FHT baseline 130 with moderate variability.  One large contraction noted since client arrived and client is feeling that contraction.  No decelerations.  15x15 accels noted.  Reactive NST  Genitourinary:    Genitourinary Comments: Speculum exam: Vagina - Mod amount of white discharge, no odor Cervix - No contact bleeding Bimanual exam: Cervix closed, no presenting part in the pelvis GC/Chlam, wet prep done Chaperone present for exam.    Musculoskeletal:        General: Normal range of motion.      Cervical back: Neck supple.  Neurological: She is alert and oriented to person, place, and time.  Skin: Skin is warm and dry.  Psychiatric: She has a normal mood and affect.    MAU Course  Procedures Labs Results for orders placed or performed during the hospital encounter of 10/15/19 (from the past 24 hour(s))  Wet prep, genital     Status: Abnormal   Collection Time: 10/15/19  1:21 PM  Specimen: PATH Cytology Cervicovaginal Ancillary Only  Result Value Ref Range   Yeast Wet Prep HPF POC NONE SEEN NONE SEEN   Trich, Wet Prep NONE SEEN NONE SEEN   Clue Cells Wet Prep HPF POC NONE SEEN NONE SEEN   WBC, Wet Prep HPF POC MODERATE (A) NONE SEEN   Sperm NONE SEEN   Fetal fibronectin     Status: None   Collection Time: 10/15/19  1:22 PM  Result Value Ref Range   Fetal Fibronectin NEGATIVE NEGATIVE  Urinalysis, Routine w reflex microscopic     Status: Abnormal   Collection Time: 10/15/19  3:09 PM  Result Value Ref Range   Color, Urine YELLOW YELLOW   APPearance HAZY (A) CLEAR   Specific Gravity, Urine 1.008 1.005 - 1.030   pH 6.0 5.0 - 8.0   Glucose, UA NEGATIVE NEGATIVE mg/dL   Hgb urine dipstick NEGATIVE NEGATIVE   Bilirubin Urine NEGATIVE NEGATIVE   Ketones, ur NEGATIVE NEGATIVE mg/dL   Protein, ur NEGATIVE NEGATIVE mg/dL   Nitrite NEGATIVE NEGATIVE   Leukocytes,Ua NEGATIVE NEGATIVE     MDM Will get urinalysis when client is able to void.  Will push PO fluids but if contractions are continuing will consider IV hydration.    Contractions spaced out.  Client was sleeping.  Still has an occasional contraction but now are not painful like they were earlier today.  Advised to return to MAU if contractions become painful or more frequent again.  Assessment and Plan  Preterm contractions and currently braxton hicks contractions Gestational diabetes AMA Reactive NST  Plan Follow up in the office on [place] 1 as scheduled. No intercourse until seen in the office to not  stimulate any contractions at this time. Return to MAU sooner if having contractions 4-6 in one hour or painful contractions. Drink at least 8 8-oz glasses of water every day as you have been doing.   Aldean Suddeth L Karely Hurtado 10/15/2019, 1:21 PM

## 2019-10-15 NOTE — MAU Note (Signed)
Pt reporting contractions which started during her OP U/S. Denies LOF or VB. Has partial abruption. +FM

## 2019-10-15 NOTE — Discharge Instructions (Signed)
Follow up in the office on [place] 1 as scheduled. No intercourse until seen in the office to not stimulate any contractions at this time. Return to MAU sooner if having contractions 4-6 in one hour or painful contractions. Drink at least 8 8-oz glasses of water every day as you have been doing.   Braxton Hicks Contractions Contractions of the uterus can occur throughout pregnancy, but they are not always a sign that you are in labor. You may have practice contractions called Braxton Hicks contractions. These false labor contractions are sometimes confused with true labor. What are Deberah Pelton contractions? Braxton Hicks contractions are tightening movements that occur in the muscles of the uterus before labor. Unlike true labor contractions, these contractions do not result in opening (dilation) and thinning of the cervix. Toward the end of pregnancy (32-34 weeks), Braxton Hicks contractions can happen more often and may become stronger. These contractions are sometimes difficult to tell apart from true labor because they can be very uncomfortable. You should not feel embarrassed if you go to the hospital with false labor. Sometimes, the only way to tell if you are in true labor is for your health care provider to look for changes in the cervix. The health care provider will do a physical exam and may monitor your contractions. If you are not in true labor, the exam should show that your cervix is not dilating and your water has not broken. If there are no other health problems associated with your pregnancy, it is completely safe for you to be sent home with false labor. You may continue to have Braxton Hicks contractions until you go into true labor. How to tell the difference between true labor and false labor True labor  Contractions last 30-70 seconds.  Contractions become very regular.  Discomfort is usually felt in the top of the uterus, and it spreads to the lower abdomen and low  back.  Contractions do not go away with walking.  Contractions usually become more intense and increase in frequency.  The cervix dilates and gets thinner. False labor  Contractions are usually shorter and not as strong as true labor contractions.  Contractions are usually irregular.  Contractions are often felt in the front of the lower abdomen and in the groin.  Contractions may go away when you walk around or change positions while lying down.  Contractions get weaker and are shorter-lasting as time goes on.  The cervix usually does not dilate or become thin. Follow these instructions at home:   Take over-the-counter and prescription medicines only as told by your health care provider.  Keep up with your usual exercises and follow other instructions from your health care provider.  Eat and drink lightly if you think you are going into labor.  If Braxton Hicks contractions are making you uncomfortable: ? Change your position from lying down or resting to walking, or change from walking to resting. ? Sit and rest in a tub of warm water. ? Drink enough fluid to keep your urine pale yellow. Dehydration may cause these contractions. ? Do slow and deep breathing several times an hour.  Keep all follow-up prenatal visits as told by your health care provider. This is important. Contact a health care provider if:  You have a fever.  You have continuous pain in your abdomen. Get help right away if:  Your contractions become stronger, more regular, and closer together.  You have fluid leaking or gushing from your vagina.  You pass  blood-tinged mucus (bloody show).  You have bleeding from your vagina.  You have low back pain that you never had before.  You feel your baby's head pushing down and causing pelvic pressure.  Your baby is not moving inside you as much as it used to. Summary  Contractions that occur before labor are called Braxton Hicks contractions, false  labor, or practice contractions.  Braxton Hicks contractions are usually shorter, weaker, farther apart, and less regular than true labor contractions. True labor contractions usually become progressively stronger and regular, and they become more frequent.  Manage discomfort from [facility] contractions by changing position, resting in a warm bath, drinking plenty of water, or practicing deep breathing. This information is not intended to replace advice given to you by your health care provider. Make sure you discuss any questions you have with your health care provider. Document Revised: 06/21/2017 Document Reviewed: 11/22/2016 Elsevier Patient Education  [place].

## 2019-10-16 LAB — GC/CHLAMYDIA PROBE AMP (~~LOC~~) NOT AT ARMC
Chlamydia: NEGATIVE
Comment: NEGATIVE
Comment: NORMAL
Neisseria Gonorrhea: NEGATIVE

## 2019-10-19 ENCOUNTER — Other Ambulatory Visit: Payer: Self-pay

## 2019-10-19 MED ORDER — ACCU-CHEK GUIDE W/DEVICE KIT: 1.0000 | PACK | 0 refills | Status: DC | PRN

## 2019-10-19 NOTE — Telephone Encounter (Signed)
Received Accu Chek Guide Kit request to be resent to [facility] pharmacy due to prescription not covered by pt's pharmacy.  E-prescribed Accu Chek guide.    Mel Almond, RN

## 2019-10-21 ENCOUNTER — Other Ambulatory Visit: Payer: Self-pay

## 2019-10-22 ENCOUNTER — Other Ambulatory Visit: Payer: Medicaid Other

## 2019-10-22 ENCOUNTER — Encounter: Payer: Medicaid Other | Admitting: Obstetrics and Gynecology

## 2019-10-25 ENCOUNTER — Encounter: Payer: Self-pay | Admitting: Family Medicine

## 2019-10-26 ENCOUNTER — Encounter: Payer: Medicaid Other | Admitting: Obstetrics and Gynecology

## 2019-10-29 ENCOUNTER — Other Ambulatory Visit: Payer: Medicaid Other

## 2019-10-29 ENCOUNTER — Encounter: Payer: Medicaid Other | Admitting: Obstetrics and Gynecology

## 2019-11-06 ENCOUNTER — Ambulatory Visit (INDEPENDENT_AMBULATORY_CARE_PROVIDER_SITE_OTHER): Payer: Medicaid Other | Admitting: Obstetrics & Gynecology

## 2019-11-06 ENCOUNTER — Other Ambulatory Visit: Payer: Self-pay

## 2019-11-06 VITALS — BP 119/83 | HR 86 | Wt 204.8 lb

## 2019-11-06 DIAGNOSIS — O0990 Supervision of high risk pregnancy, unspecified, unspecified trimester: Secondary | ICD-10-CM

## 2019-11-06 DIAGNOSIS — O99343 Other mental disorders complicating pregnancy, third trimester: Secondary | ICD-10-CM

## 2019-11-06 DIAGNOSIS — F419 Anxiety disorder, unspecified: Secondary | ICD-10-CM

## 2019-11-06 DIAGNOSIS — O24410 Gestational diabetes mellitus in pregnancy, diet controlled: Secondary | ICD-10-CM

## 2019-11-06 DIAGNOSIS — F329 Major depressive disorder, single episode, unspecified: Secondary | ICD-10-CM

## 2019-11-06 DIAGNOSIS — O09523 Supervision of elderly multigravida, third trimester: Secondary | ICD-10-CM

## 2019-11-06 DIAGNOSIS — Z3A33 33 weeks gestation of pregnancy: Secondary | ICD-10-CM

## 2019-11-06 NOTE — Patient Instructions (Addendum)
Return to office for any scheduled appointments. Call the office or go to the MAU at [facility] at [facility] if:  You begin to have strong, frequent contractions  Your water breaks.  Sometimes it is a big gush of fluid, sometimes it is just a trickle that keeps getting your panties wet or running down your legs  You have vaginal bleeding.  It is normal to have a small amount of spotting if your cervix was checked.   You do not feel your baby moving like normal.  If you do not, get something to eat and drink and lay down and focus on feeling your baby move.   If your baby is still not moving like normal, you should call the office or go to MAU.  Any other obstetric concerns.   Fetal Movement Counts Patient Name: ________________________________________________ Patient Due Date: ____________________ What is a fetal movement count?  A fetal movement count is the number of times that you feel your baby move during a certain amount of time. This may also be called a fetal kick count. A fetal movement count is recommended for every pregnant woman. You may be asked to start counting fetal movements as early as week 28 of your pregnancy. Pay attention to when your baby is most active. You may notice your baby's sleep and wake cycles. You may also notice things that make your baby move more. You should do a fetal movement count:  When your baby is normally most active.  At the same time each day. A good time to count movements is while you are resting, after having something to eat and drink. How do I count fetal movements? 1. Find a quiet, comfortable area. Sit, or lie down on your side. 2. Write down the date, the start time and stop time, and the number of movements that you felt between those two times. Take this information with you to your health care visits. 3. Write down your start time when you feel the first movement. 4. Count kicks, flutters, swishes, rolls, and jabs.  You should feel at least 10 movements. 5. You may stop counting after you have felt 10 movements, or if you have been counting for 2 hours. Write down the stop time. 6. If you do not feel 10 movements in 2 hours, contact your health care provider for further instructions. Your health care provider may want to do additional tests to assess your baby's well-being. Contact a health care provider if:  You feel fewer than 10 movements in 2 hours.  Your baby is not moving like he or she usually does. Date: ____________ Start time: ____________ Stop time: ____________ Movements: ____________ Date: ____________ Start time: ____________ Stop time: ____________ Movements: ____________ Date: ____________ Start time: ____________ Stop time: ____________ Movements: ____________ Date: ____________ Start time: ____________ Stop time: ____________ Movements: ____________ Date: ____________ Start time: ____________ Stop time: ____________ Movements: ____________ Date: ____________ Start time: ____________ Stop time: ____________ Movements: ____________ Date: ____________ Start time: ____________ Stop time: ____________ Movements: ____________ Date: ____________ Start time: ____________ Stop time: ____________ Movements: ____________ Date: ____________ Start time: ____________ Stop time: ____________ Movements: ____________ This information is not intended to replace advice given to you by your health care provider. Make sure you discuss any questions you have with your health care provider. Document Revised: 02/26/2019 Document Reviewed: 02/26/2019 Elsevier Patient Education  2020 [employer].  Intrauterine Device Information An intrauterine device (IUD) is a medical device that is inserted in  the uterus to prevent pregnancy. It is a small, T-shaped device that has one or two nylon strings hanging down from it. The strings hang out of the lower part of the uterus (cervix) to allow for future IUD removal.  There are two types of IUDs available:  Hormone IUD. This type of IUD is made of plastic and contains the hormone progestin (synthetic progesterone). A hormone IUD may last 3-5 years.  Copper IUD. This type of IUD has copper wire wrapped around it. A copper IUD may last up to 10 years. How is an IUD inserted? An IUD is inserted through the vagina and placed into the uterus with a minor medical procedure. The exact procedure for IUD insertion may vary among health care providers and hospitals. How does an IUD work? Synthetic progesterone in a hormonal IUD prevents pregnancy by:  Thickening cervical mucus to prevent sperm from entering the uterus.  Thinning the uterine lining to prevent a fertilized egg from being implanted there. Copper in a copper IUD prevents pregnancy by making the uterus and fallopian tubes produce a fluid that kills sperm. What are the advantages of an IUD? Advantages of either type of IUD  It is highly effective in preventing pregnancy.  It is reversible. You can become pregnant shortly after the IUD is removed.  It is low-maintenance and can stay in place for a long time.  There are no estrogen-related side effects.  It can be used when breastfeeding.  It is not associated with weight gain.  It can be inserted right after childbirth, an abortion, or a miscarriage. Advantages of a hormone IUD  If it is inserted within 7 days of your period starting, it works right after it is inserted. If the hormone IUD is inserted at any other time in your cycle, you will need to use a backup method of birth control for 7 days after insertion.  It can make menstrual periods lighter.  It can reduce menstrual cramping.  It can be used for 3-5 years. Advantages of a copper IUD  It works right after it is inserted.  It can be used as a form of emergency birth control if it is inserted within 5 days after having unprotected sex.  It does not interfere with your body's  natural hormones.  It can be used for 10 years. What are the disadvantages of an IUD?  An IUD may cause irregular menstrual bleeding for a period of time after insertion.  You may have pain during insertion and have cramping and vaginal bleeding after insertion.  An IUD may cut the uterus (uterine perforation) when it is inserted. This is rare.  An IUD may cause pelvic inflammatory disease (PID), which is an infection in the uterus and fallopian tubes. This is rare, and it usually happens during the first 20 days after the IUD is inserted.  A copper IUD can make your menstrual flow heavier and more painful. How is an IUD removed?  You will lie on your back with your knees bent and your feet in footrests (stirrups).  A device will be inserted into your vagina to spread apart the vaginal walls (speculum). This will allow your health care provider to see the strings attached to the IUD.  Your health care provider will use a small instrument (forceps) to grasp the IUD strings and pull firmly until the IUD is removed. You may have some discomfort when the IUD is removed. Your health care provider may recommend taking over-the-counter pain  relievers, such as ibuprofen, before the procedure. You may also have minor spotting for a few days after the procedure. The exact procedure for IUD removal may vary among health care providers and hospitals. Is the IUD right for me? Your health care provider will make sure you are a good candidate for an IUD and will discuss the advantages, disadvantages, and possible side effects with you. Summary  An intrauterine device (IUD) is a medical device that is inserted in the uterus to prevent pregnancy. It is a small, T-shaped device that has one or two nylon strings hanging down from it.  A hormone IUD contains the hormone progestin (synthetic progesterone). A copper IUD has copper wire wrapped around it.  Synthetic progesterone in a hormone IUD prevents  pregnancy by thickening cervical mucus and thinning the walls of the uterus. Copper in a copper IUD prevents pregnancy by making the uterus and fallopian tubes produce a fluid that kills sperm.  A hormone IUD can be left in place for 3-5 years. A copper IUD can be left in place for up to 10 years.  An IUD is inserted and removed by a health care provider. You may feel some pain during insertion and removal. Your health care provider may recommend taking over-the-counter pain medicine, such as ibuprofen, before an IUD procedure. This information is not intended to replace advice given to you by your health care provider. Make sure you discuss any questions you have with your health care provider. Document Revised: 06/21/2017 Document Reviewed: 08/07/2016 Elsevier Patient Education  2020 [employer].

## 2019-11-06 NOTE — Progress Notes (Signed)
   PRENATAL VISIT NOTE  Subjective:  Yesenia Weeks is a 44 y.o. [id number] at [redacted]w[redacted]d being seen today for ongoing prenatal care.  She is currently monitored for the following issues for this high-risk pregnancy and has ANEMIA, IRON DEFICIENCY, UNSPEC.; Mood disorder (HCC); Allergic rhinitis; ACNE, MILD; ASTEATOTIC ECZEMA; HALLUX VALGUS, ACQUIRED; Genital herpes; Family history of breast cancer; Anxiety state; Tenosynovitis, de Quervain; Bacterial vaginosis; Cold sore; Exposure to COVID-19 virus; Canker sores oral; Supervision of high risk pregnancy, antepartum; Depression; AMA (advanced maternal age) multigravida 35+; History of IUFD; History of prior pregnancy with SGA newborn; History of substance abuse (HCC); Antepartum bleeding, second trimester; and Gestational diabetes mellitus (GDM), antepartum on their problem list.  Patient reports feeling very overwhelmed, unable to check blood sugars as directed. Very anxious. Has talked to [facility] counselor in past, wants to talk to her again.  Contractions: Irregular. Vag. Bleeding: None.  Movement: Present. Denies leaking of fluid.   The following portions of the patient's history were reviewed and updated as appropriate: allergies, current medications, past family history, past medical history, past social history, past surgical history and problem list.   Objective:   Vitals:   11/06/19 1100  BP: 119/83  Pulse: 86  Weight: 204 lb 12.8 oz (92.9 kg)    Fetal Status: Fetal Heart Rate (bpm): 138   Movement: Present     General:  Alert, oriented and cooperative. Patient is in no acute distress.  Skin: Skin is warm and dry. No rash noted.   Cardiovascular: Normal heart rate noted  Respiratory: Normal respiratory effort, no problems with respiration noted  Abdomen: Soft, gravid, appropriate for gestational age.  Pain/Pressure: Present     Pelvic: Cervical exam deferred        Extremities: Normal range of motion.  Edema: Trace  Mental Status: Normal mood  and affect. Normal behavior. Normal judgment and thought content.   Assessment and Plan:  Pregnancy: [id number] at [redacted]w[redacted]d 1. Diet controlled gestational diabetes mellitus (GDM), antepartum Patient showed how to log values into Babyscripts DM.  Had normal values on paper but were few. Told to log more values, at least 3 a day.  Continue diet control for now.  2. Multigravida of advanced maternal age in third trimester Continue scans and BPP as per MFM  3. Anxiety and depression Will follow up with [facility] counselor  4. Supervision of high risk pregnancy, antepartum Desires IPP Liletta, was counsled about this. Preterm labor symptoms and general obstetric precautions including but not limited to vaginal bleeding, contractions, leaking of fluid and fetal movement were reviewed in detail with the patient. Please refer to After Visit Summary for other counseling recommendations.   Return in about 2 weeks (around 11/20/2019) for OFFICE [facility] Visit.  Future Appointments  Date Time Provider Department Center  11/10/2019 10:15 AM [facility] WOC-WOCA WOC  11/12/2019 11:15 AM WH-MFC NURSE WH-MFC MFC-US  11/12/2019 11:15 AM WH-MFC [place] 4 WH-MFCUS MFC-US  11/18/2019  9:15 AM WH-MFC NURSE WH-MFC MFC-US  11/18/2019  9:15 AM WH-MFC [place] 4 WH-MFCUS MFC-US  11/23/2019 10:35 AM Penne Lash, Fredrich Romans, MD WOC-WOCA WOC  11/26/2019 11:15 AM WH-MFC NURSE WH-MFC MFC-US  11/26/2019 11:15 AM WH-MFC [place] 4 WH-MFCUS MFC-US    Jaynie Collins, MD

## 2019-11-10 ENCOUNTER — Encounter: Payer: Medicaid Other | Attending: Family Medicine | Admitting: Registered"

## 2019-11-10 ENCOUNTER — Ambulatory Visit: Payer: Medicaid Other | Admitting: Registered"

## 2019-11-10 ENCOUNTER — Telehealth: Payer: Self-pay | Admitting: *Deleted

## 2019-11-10 ENCOUNTER — Other Ambulatory Visit: Payer: Self-pay

## 2019-11-10 DIAGNOSIS — O09529 Supervision of elderly multigravida, unspecified trimester: Secondary | ICD-10-CM | POA: Diagnosis not present

## 2019-11-10 DIAGNOSIS — Z713 Dietary counseling and surveillance: Secondary | ICD-10-CM | POA: Insufficient documentation

## 2019-11-10 DIAGNOSIS — O24419 Gestational diabetes mellitus in pregnancy, unspecified control: Secondary | ICD-10-CM | POA: Diagnosis not present

## 2019-11-10 DIAGNOSIS — Z3A00 Weeks of gestation of pregnancy not specified: Secondary | ICD-10-CM | POA: Diagnosis not present

## 2019-11-10 NOTE — Progress Notes (Signed)
Patient was seen on 11/10/19 for follow-up Gestational Diabetes self-management. EDD 12/21/19. Patient states she gave away a lot of food because she thought she couldn't eat bread or anything sweet. Pt reports drinking mostly water.  Patient is concerned that she stopped gaining weight, she thinks it was because she has had reduced appetite and also due to gestational diabetes has been afraid to eat.   Patient states she has not received Rx for iron but is working to increase her intake of iron rich foods.  Pt reported blood glucose readings: FBS: 84, 97, 113 PPBG?: 107, 99, 127, 130 (not sure on the timing with food)  Patient instructed to monitor glucose levels: FBS: 60 - 95 mg/dl 2 hour: <931 mg/dl  Patient received the following handouts:  Nutrition Diabetes and Pregnancy  Carbohydrate Counting List  Carb/protein snack sheet  Patient will be seen for follow-up as needed.

## 2019-11-10 NOTE — Telephone Encounter (Signed)
TC received from Rem @ [facility] who stated that pt had triggered an elevated BP of 141/82 last pm. She reported that pt's recheck BP was 128/77. Per email message review to clinical staff, pt has also had elevated BP's on the following days, 4/17 and 4/18. I called pt and discussed these findings and concerns. She stated that on each occasion of elevated BP over the past few days, her BP re-check was normal when taken 10 minutes later. Pt reports she has never had high BP in the past. She states she has been having occasional H/A which is relieved with tylenol. She also has some intermittent dizziness which subsides after rest. Pt has been checking her BP daily because of recent elevated reading. I advised pt to check her BP once weekly and she should sit calmly for 15 minutes prior to checking. If she has sx of pre-e (H/A or visual disturbances) which do not resolve with rest and/or tylenol, she should check the BP @ that time. Pt voiced understanding and agreed to plan of care.

## 2019-11-12 ENCOUNTER — Ambulatory Visit (HOSPITAL_COMMUNITY)
Admission: RE | Admit: 2019-11-12 | Discharge: 2019-11-12 | Disposition: A | Discharge status: Home / Self Care | Payer: Medicaid Other | Source: Ambulatory Visit | Attending: Obstetrics and Gynecology | Admitting: Obstetrics and Gynecology

## 2019-11-12 ENCOUNTER — Other Ambulatory Visit: Payer: Self-pay

## 2019-11-12 ENCOUNTER — Other Ambulatory Visit (HOSPITAL_COMMUNITY): Payer: Self-pay | Admitting: Obstetrics and Gynecology

## 2019-11-12 ENCOUNTER — Ambulatory Visit (HOSPITAL_COMMUNITY): Payer: Medicaid Other | Admitting: *Deleted

## 2019-11-12 ENCOUNTER — Other Ambulatory Visit: Payer: Self-pay | Admitting: Obstetrics & Gynecology

## 2019-11-12 ENCOUNTER — Encounter (HOSPITAL_COMMUNITY): Payer: Self-pay

## 2019-11-12 VITALS — BP 129/80 | HR 86 | Temp 97.4°F | Wt 202.0 lb

## 2019-11-12 DIAGNOSIS — O09523 Supervision of elderly multigravida, third trimester: Secondary | ICD-10-CM | POA: Insufficient documentation

## 2019-11-12 DIAGNOSIS — Z3A34 34 weeks gestation of pregnancy: Secondary | ICD-10-CM

## 2019-11-12 DIAGNOSIS — F1911 Other psychoactive substance abuse, in remission: Secondary | ICD-10-CM | POA: Insufficient documentation

## 2019-11-12 DIAGNOSIS — O24410 Gestational diabetes mellitus in pregnancy, diet controlled: Secondary | ICD-10-CM | POA: Insufficient documentation

## 2019-11-12 DIAGNOSIS — O99013 Anemia complicating pregnancy, third trimester: Secondary | ICD-10-CM

## 2019-11-12 DIAGNOSIS — Z8759 Personal history of other complications of pregnancy, childbirth and the puerperium: Secondary | ICD-10-CM | POA: Insufficient documentation

## 2019-11-12 DIAGNOSIS — O0990 Supervision of high risk pregnancy, unspecified, unspecified trimester: Secondary | ICD-10-CM | POA: Insufficient documentation

## 2019-11-12 DIAGNOSIS — O09529 Supervision of elderly multigravida, unspecified trimester: Secondary | ICD-10-CM

## 2019-11-12 MED ORDER — FERROUS SULFATE 325 (65 FE) MG PO TABS: 325.0000 mg | ORAL_TABLET | Freq: Two times a day (BID) | ORAL | 3 refills | Status: DC

## 2019-11-12 NOTE — Progress Notes (Signed)
Pt here in front office following [place] appt at approx 1215. Pt states she is concerned about low iron levels and would like to know if she needs a supplement. Explained to pt I will contact the provider she saw last and inquire about a supplement. Message sent to [facility], MD.

## 2019-11-13 ENCOUNTER — Other Ambulatory Visit (HOSPITAL_COMMUNITY): Payer: Self-pay | Admitting: *Deleted

## 2019-11-13 DIAGNOSIS — O24410 Gestational diabetes mellitus in pregnancy, diet controlled: Secondary | ICD-10-CM

## 2019-11-18 ENCOUNTER — Ambulatory Visit (HOSPITAL_COMMUNITY): Payer: Medicaid Other | Admitting: *Deleted

## 2019-11-18 ENCOUNTER — Other Ambulatory Visit: Payer: Self-pay

## 2019-11-18 ENCOUNTER — Encounter (HOSPITAL_COMMUNITY): Payer: Self-pay

## 2019-11-18 ENCOUNTER — Ambulatory Visit (HOSPITAL_COMMUNITY)
Admission: RE | Admit: 2019-11-18 | Discharge: 2019-11-18 | Disposition: A | Discharge status: Home / Self Care | Payer: Medicaid Other | Source: Ambulatory Visit | Attending: Obstetrics and Gynecology | Admitting: Obstetrics and Gynecology

## 2019-11-18 DIAGNOSIS — O24410 Gestational diabetes mellitus in pregnancy, diet controlled: Secondary | ICD-10-CM | POA: Diagnosis not present

## 2019-11-18 DIAGNOSIS — O09529 Supervision of elderly multigravida, unspecified trimester: Secondary | ICD-10-CM | POA: Insufficient documentation

## 2019-11-18 DIAGNOSIS — F1911 Other psychoactive substance abuse, in remission: Secondary | ICD-10-CM | POA: Diagnosis present

## 2019-11-18 DIAGNOSIS — O09523 Supervision of elderly multigravida, third trimester: Secondary | ICD-10-CM | POA: Insufficient documentation

## 2019-11-18 DIAGNOSIS — Z8759 Personal history of other complications of pregnancy, childbirth and the puerperium: Secondary | ICD-10-CM | POA: Diagnosis present

## 2019-11-18 DIAGNOSIS — O0990 Supervision of high risk pregnancy, unspecified, unspecified trimester: Secondary | ICD-10-CM | POA: Insufficient documentation

## 2019-11-18 DIAGNOSIS — Z3A35 35 weeks gestation of pregnancy: Secondary | ICD-10-CM

## 2019-11-23 ENCOUNTER — Other Ambulatory Visit (HOSPITAL_COMMUNITY)
Admission: RE | Admit: 2019-11-23 | Discharge: 2019-11-23 | Disposition: A | Discharge status: Home / Self Care | Payer: Medicaid Other | Source: Ambulatory Visit | Attending: Obstetrics & Gynecology | Admitting: Obstetrics & Gynecology

## 2019-11-23 ENCOUNTER — Other Ambulatory Visit: Payer: Self-pay

## 2019-11-23 ENCOUNTER — Ambulatory Visit (INDEPENDENT_AMBULATORY_CARE_PROVIDER_SITE_OTHER): Payer: Medicaid Other | Admitting: Obstetrics & Gynecology

## 2019-11-23 ENCOUNTER — Telehealth: Payer: Self-pay

## 2019-11-23 VITALS — BP 129/86 | HR 109 | Wt 208.4 lb

## 2019-11-23 DIAGNOSIS — O0990 Supervision of high risk pregnancy, unspecified, unspecified trimester: Secondary | ICD-10-CM | POA: Insufficient documentation

## 2019-11-23 DIAGNOSIS — O24410 Gestational diabetes mellitus in pregnancy, diet controlled: Secondary | ICD-10-CM

## 2019-11-23 DIAGNOSIS — Z3A36 36 weeks gestation of pregnancy: Secondary | ICD-10-CM

## 2019-11-23 DIAGNOSIS — O09523 Supervision of elderly multigravida, third trimester: Secondary | ICD-10-CM

## 2019-11-23 DIAGNOSIS — O99013 Anemia complicating pregnancy, third trimester: Secondary | ICD-10-CM

## 2019-11-23 DIAGNOSIS — O24419 Gestational diabetes mellitus in pregnancy, unspecified control: Secondary | ICD-10-CM

## 2019-11-23 DIAGNOSIS — O133 Gestational [pregnancy-induced] hypertension without significant proteinuria, third trimester: Secondary | ICD-10-CM

## 2019-11-23 DIAGNOSIS — D509 Iron deficiency anemia, unspecified: Secondary | ICD-10-CM

## 2019-11-23 LAB — POCT URINALYSIS DIP (DEVICE)
Bilirubin Urine: NEGATIVE
Glucose, UA: NEGATIVE mg/dL
Ketones, ur: NEGATIVE mg/dL
Leukocytes,Ua: NEGATIVE
Nitrite: NEGATIVE
Protein, ur: NEGATIVE mg/dL
Specific Gravity, Urine: 1.01 (ref 1.005–1.030)
Urobilinogen, UA: 0.2 mg/dL (ref 0.0–1.0)
pH: 6 (ref 5.0–8.0)

## 2019-11-23 LAB — OB RESULTS CONSOLE GC/CHLAMYDIA: Gonorrhea: NEGATIVE

## 2019-11-23 NOTE — Telephone Encounter (Signed)
Patient was seen today. LVM asking about her sugars and for her to make sure she remembers to log them so we can keep track.

## 2019-11-23 NOTE — Progress Notes (Signed)
Induction Assessment Scheduling Form: Fax to [facility]:  [phone] Route to MC-2S Labor Delivery   Yesenia Weeks                                                                                   DOB:  December 22, 1975                                                            MRN:  [id number]  Phone:  Home Phone [phone]  Mobile [phone]    Provider:  [facility] (Faculty Practice)  Admission Date/Time:  11/30/19 GP:  [id number]     Gestational age on admission:  37                                                Estimated Date of Delivery: 12/21/19  Dating Criteria: [place] at 14 weeks  Filed Weights   11/23/19 1101  Weight: 208 lb 6.4 oz (94.5 kg)    GBS:   HIV:  Non Reactive (03/17 0911)  Medical Indications for induction:  Gestational HTN Scheduling Provider Signature:  Elsie Lincoln, MD         Method of induction(proposed):  Foley placed in MAY Sunday, May 9th.   Scheduling Provider Signature:  Elsie Lincoln, MD                                            Today's Date:  11/23/2019

## 2019-11-23 NOTE — Progress Notes (Signed)
     PRENATAL VISIT NOTE  Subjective:  Yesenia Weeks is a 44 y.o. [id number] at [redacted]w[redacted]d being seen today for ongoing prenatal care.  She is currently monitored for the following issues for this high-risk pregnancy and has ANEMIA, IRON DEFICIENCY, UNSPEC.; Mood disorder (HCC); Allergic rhinitis; ACNE, MILD; ASTEATOTIC ECZEMA; HALLUX VALGUS, ACQUIRED; Genital herpes; Family history of breast cancer; Anxiety state; Tenosynovitis, de Quervain; Bacterial vaginosis; Cold sore; Exposure to COVID-19 virus; Canker sores oral; Supervision of high risk pregnancy, antepartum; Depression; AMA (advanced maternal age) multigravida 35+; History of IUFD; History of prior pregnancy with SGA newborn; History of substance abuse (HCC); Antepartum bleeding, second trimester; and Gestational diabetes mellitus (GDM), antepartum on their problem list.  Patient reports headache and swelling; (no headache today).   .  .   . Denies leaking of fluid.   The following portions of the patient's history were reviewed and updated as appropriate: allergies, current medications, past family history, past medical history, past social history, past surgical history and problem list.   Objective:  There were no vitals filed for this visit.  Fetal Status:           General:  Alert, oriented and cooperative. Patient is in no acute distress.  Skin: Skin is warm and dry. No rash noted.   Cardiovascular: Normal heart rate noted  Respiratory: Normal respiratory effort, no problems with respiration noted  Abdomen: Soft, gravid, appropriate for gestational age.        Pelvic: Cervical exam performed in the presence of a chaperone        Extremities: Normal range of motion.     Mental Status: Normal mood and affect. Normal behavior. Normal judgment and thought content.   Assessment and Plan:  Pregnancy: [id number] at [redacted]w[redacted]d 1. Supervision of high risk pregnancy, antepartum Continue iron for mild anemia.  2. Gestational diabetes mellitus  (GDM), antepartum, gestational diabetes method of control unspecified Doing well with taking CBGs more often. Diet controlled.  Had two elevated fastings so will watch closely.      3.  Gestational HTN Pt has sever BPs at home elevated.  (documented in [place]).  Pt has occasional HA and coming more frequently.  Pt meets criteria for GHTN.  Neg protein today.  Induction set for 37 weeks with foley on 11/29/19 in MAU.  Pre E precautions given.  BPP this week.    Term labor symptoms and general obstetric precautions including but not limited to vaginal bleeding, contractions, leaking of fluid and fetal movement were reviewed in detail with the patient. Please refer to After Visit Summary for other counseling recommendations.   No follow-ups on file.  Future Appointments  Date Time Provider Department Center  11/26/2019 11:15 AM [facility] NURSE [facility]  11/26/2019 11:15 AM [facility] US4 [facility]  12/03/2019  3:30 PM [facility] NURSE [facility]  12/03/2019  3:30 PM [facility] US3 [facility]  12/10/2019 11:30 AM [facility] NURSE [facility]-M[facility]  12/10/2019 11:30 AM [facility]-M[facility] US3 WMC-MFCUS WMC    Elsie Lincoln, MD

## 2019-11-23 NOTE — Patient Instructions (Addendum)
If you are in need of transportation to get to and from your appointments in our office.  You can reach Transportation Services by calling [phone] Monday - Friday  7am-6pm.    [facility]  Department of Social [facility] [address] [place], [place] [address] [phone]   or  www.https://hall.info/ **SNAP/EBT/ Other nutritional benefits  [facility] [address] [place], [place], [place] [address] [phone]  or  https://king.net/ **WIC for  women who are pregnant and postpartum, infants and children up to 38 years old  [facility] [address], [place], [place] [address] [phone]   or   www.theblessedtable.org  **Food pantry  [facility] [address] [place], [place], [place] [address] [phone]   or   https://brotherkolbes.godaddysites.com  **Emergency food and prepared meals  [address] [place] of [place] Food Pantry [address] [place], [place] [address] [phone]   or   www.cedargrovetop.us **Food pantry  [facility] Food Pantry [address] [place], [place] [address] [phone]   or   www.GolfingFamily.no **Food pantry  [facility] [address], [place], [place] [address] [phone] **Food pantry  [facility] [address], [place], [place] [address] [phone]   or   www.greensborourbanministry.org  Tour manager and prepared meals  [facility] [address] [place], [address] [place], [place], [place] [address] VerifiedMovies.gl  **Food pantry  [employer] [facility] [address], [place], [place] [address] [phone]   or   www.lbcnow.org  **Food pantry  [facility] [address], [place], [place] [address] [phone]   or   WorkingMBA.co.nz **Food pantry, nutrition education, gardening activities  [facility] Food Pantry [address] [place], [place] [address] [phone] **Food pantry  [facility] [address], [place], [place] [address] [phone]   or   www.salvationarmyofgreensboro.Roddie Mc of [place] [address], [place], [place] [address] [phone]   or   http://senior-resources-[place].org Dole Food on Wheels Program  [facility] [address] [place], [place] [address] [phone]   or   www.stmattchurch.com  **Food pantry  [facility] Food Pantry [address] [place], [place] [address] [phone]   or   vandaliapresbyterianchurch.org **Food pantry  [employer] Food Resources  [employer] Pant[facility] [address] [place], [place], [place] [address] [phone]   or   www.FightingMatch.com.ee Scientist, research (physical sciences) [employer] of [employer] [address] [place], [place] [address] [phone] **Food pantry  [facility] Food Resources   Department of [facility] [address] [place], [place] [address] [phone]   or   www.co.[place].us/ph/  [facility]-WIC [facility]) [address], [place], [place] [address] [phone]   or   https://www.rios-wells.com/ **WIC for pregnant and postpartum women, infants and children up to 50 years old  [facility]ionate Pant[facility] [address], [place], [place] [address] [phone] **Food pantry  [facility] Food Pantry [address], [place], [place] [address] [phone]   or   emerywoodbaptistchurch.com *Food pantry  [employer] Food [facility] [address] [place],  [place] [address] [phone]   or   www.fcchighpoint.org **Food pantry  [facility] [address] [place], [place] [address] [phone]   or   http://www.bird.biz/ **Food  pantry  [place] [address], [place], [place] [address] [phone]   or   www.facebook.com/KBCI1 Social worker of [place] [address], [place], [place] [address] [phone]   or   www.abbottscreek.org [employer] of [place] [phone]   **Delivers meals  [facility] [employer] [facility] [address], [place], [place] [address] [phone]   or   nbfgm.sundaystreamwebsites.com  Programme researcher, broadcasting/film/video of [employer] [address] [place], [place] [address] [phone]   or   www.odm-hp.org  **Food pantry  [place] [address], [place], [place] [address] [phone]   or   R2live.tv **Food pantry  [place] [address], [place], [place] [address] [phone]   or   ClubMonetize.fr **Emergency food and pet food  [facility] [address], [place], [place] [address] [phone]   or   www.senioradults.org **Congregate and delivered meals to older adults  [place] [facility] [employer] [address], [place], [place] [address] [phone]   or   SamedayNews.com.cy **Back Pack Program for elementary school students  [place] [address] [place], [place] [address] [phone]   or   www.wardstreetcommunityresources.org **Food pantry  [facility] [address], [place], [place] [address] [phone]   or   https://www.carlson.net/ **Emergency food, nutrition classes, food budgeting  Food Resources [place]  [address] [place], [place], [place] [address] [phone]  or   www.co.[place].us/pview.aspx?id=14850&catid=407 **SNAP/Other nutrition benefits  [place], [place], [place] [address] [phone]   or  http://goodwin-walker.biz/  **SNAP/Other nutrition benefits  [facility] [place], [place], [place] [address] [phone]  or  http://www.rockinghamcountypublichealth.org **WIC for pregnant and postpartum women, infants and children up to 41 years old  Aging, Disability and [place] [address] [place], [place] [address] [phone]  or www.risodejaneiro.com **Prepared meals for older adults  [place] [address], [place], [place] [phone]  or  NetworkingMixer.com.ee **Food pantry  [facility] [facility] [address], [place], [place] [address] [phone]   or   https://www.handsofgod.org/  Insurance underwriter  Men in [place] [address] [place], [place] [address] [phone] **Food pantry  [facility] for [place], [place], [place] [address] [phone]   or   www.ci.[place].us/government/parks_and_recreation/senior_center/index.php **Congregate meal for older adults  [facility] [address], [place], [place] [address] [phone]   or www.reidsvilleoutreachcenter.org  **Food pantry  [facility] [address], [place],  [address] [phone]   or   http://reid-chang.com/ **Food pantry  Levonorgestrel intrauterine device (IUD) What is this medicine? LEVONORGESTREL IUD (LEE voe nor jes trel) is a contraceptive (birth control) device. The device is placed inside the uterus by a healthcare professional. It is used to prevent pregnancy. This device can also be used to treat  heavy bleeding that occurs during your period. This medicine may be used for other purposes; ask your health care provider or pharmacist if you have questions. COMMON BRAND NAME(S): Minette Headland What should I tell my health care provider before I take this medicine? They need  to know if you have any of these conditions:  abnormal Pap smear  cancer of the breast, uterus, or cervix  diabetes  endometritis  genital or pelvic infection now or in the past  have more than one sexual partner or your partner has more than one partner  heart disease  history of an ectopic or tubal pregnancy  immune system problems  IUD in place  liver disease or tumor  problems with blood clots or take blood-thinners  seizures  use intravenous drugs  uterus of unusual shape  vaginal bleeding that has not been explained  an unusual or allergic reaction to levonorgestrel, other hormones, silicone, or polyethylene, medicines, foods, dyes, or preservatives  pregnant or trying to get pregnant  breast-feeding How should I use this medicine? This device is placed inside the uterus by a health care professional. Talk to your pediatrician regarding the use of this medicine in children. Special care may be needed. Overdosage: If you think you have taken too much of this medicine contact a poison control center or emergency room at once. NOTE: This medicine is only for you. Do not share this medicine with others. What if I miss a dose? This does not apply. Depending on the brand of device you have inserted, the device will need to be replaced every 3 to 6 years if you wish to continue using this type of birth control. What may interact with this medicine? Do not take this medicine with any of the following medications:  amprenavir  bosentan  fosamprenavir This medicine may also interact with the following medications:  aprepitant  armodafinil  barbiturate medicines for  inducing sleep or treating seizures  bexarotene  boceprevir  griseofulvin  medicines to treat seizures like carbamazepine, ethotoin, felbamate, oxcarbazepine, phenytoin, topiramate  modafinil  pioglitazone  rifabutin  rifampin  rifapentine  some medicines to treat HIV infection like atazanavir, efavirenz, indinavir, lopinavir, nelfinavir, tipranavir, ritonavir  St. John's wort  warfarin This list may not describe all possible interactions. Give your health care provider a list of all the medicines, herbs, non-prescription drugs, or dietary supplements you use. Also tell them if you smoke, drink alcohol, or use illegal drugs. Some items may interact with your medicine. What should I watch for while using this medicine? Visit your doctor or health care professional for regular check ups. See your doctor if you or your partner has sexual contact with others, becomes HIV positive, or gets a sexual transmitted disease. This product does not protect you against HIV infection (AIDS) or other sexually transmitted diseases. You can check the placement of the IUD yourself by reaching up to the top of your vagina with clean fingers to feel the threads. Do not pull on the threads. It is a good habit to check placement after each menstrual period. Call your doctor right away if you feel more of the IUD than just the threads or if you cannot feel the threads at all. The IUD may come out by itself. You may become pregnant if the device comes out. If you notice that the IUD has come out use a backup birth control method like condoms and call your health care provider. Using tampons will not change the position of the IUD and are okay to use during your period. This IUD can be safely scanned with magnetic resonance imaging (MRI) only under specific conditions. Before you have an MRI, tell your healthcare provider that you have an IUD in place, and which type of IUD  you have in place. What side  effects may I notice from receiving this medicine? Side effects that you should report to your doctor or health care professional as soon as possible:  allergic reactions like skin rash, itching or hives, swelling of the face, lips, or tongue  fever, flu-like symptoms  genital sores  high blood pressure  no menstrual period for 6 weeks during use  pain, swelling, warmth in the leg  pelvic pain or tenderness  severe or sudden headache  signs of pregnancy  stomach cramping  sudden shortness of breath  trouble with balance, talking, or walking  unusual vaginal bleeding, discharge  yellowing of the eyes or skin Side effects that usually do not require medical attention (report to your doctor or health care professional if they continue or are bothersome):  acne  breast pain  change in sex drive or performance  changes in weight  cramping, dizziness, or faintness while the device is being inserted  headache  irregular menstrual bleeding within first 3 to 6 months of use  nausea This list may not describe all possible side effects. Call your doctor for medical advice about side effects. You may report side effects to FDA at 1-800-FDA-1088. Where should I keep my medicine? This does not apply. NOTE: This sheet is a summary. It may not cover all possible information. If you have questions about this medicine, talk to your doctor, pharmacist, or health care provider.  2020 Elsevier/Gold Standard (2018-05-20 13:22:01)

## 2019-11-23 NOTE — Telephone Encounter (Signed)
-----   Message from Gambell Bing, MD sent at 11/20/2019 11:11 AM EDT ----- Regarding: baby scripts blood sugar entries Hi, this patient only had 15 baby scripts blood sugar entries over the past week. Can you call her and see if there's anything we can help her with to check her sugars more frequently? Thanks  Claxton Bing, Montez Hageman MD Attending Center for [employer] (Faculty Practice) 11/20/2019

## 2019-11-24 ENCOUNTER — Telehealth (HOSPITAL_COMMUNITY): Payer: Self-pay | Admitting: *Deleted

## 2019-11-24 ENCOUNTER — Other Ambulatory Visit: Payer: Self-pay | Admitting: Advanced Practice Midwife

## 2019-11-24 LAB — COMPREHENSIVE METABOLIC PANEL
ALT: 14 IU/L (ref 0–32)
AST: 19 IU/L (ref 0–40)
Albumin/Globulin Ratio: 1.2 (ref 1.2–2.2)
Albumin: 3.4 g/dL — ABNORMAL LOW (ref 3.8–4.8)
Alkaline Phosphatase: 118 IU/L — ABNORMAL HIGH (ref 39–117)
BUN/Creatinine Ratio: 14 (ref 9–23)
BUN: 8 mg/dL (ref 6–24)
Bilirubin Total: <0.2 mg/dL (ref 0.0–1.2)
CO2: 18 mmol/L — ABNORMAL LOW (ref 20–29)
Calcium: 8.9 mg/dL (ref 8.7–10.2)
Chloride: 105 mmol/L (ref 96–106)
Creatinine, Ser: 0.57 mg/dL (ref 0.57–1.00)
GFR calc Af Amer: 132 mL/min/{1.73_m2} (ref >59)
GFR calc non Af Amer: 115 mL/min/{1.73_m2} (ref >59)
Globulin, Total: 2.8 g/dL (ref 1.5–4.5)
Glucose: 90 mg/dL (ref 65–99)
Potassium: 4 mmol/L (ref 3.5–5.2)
Sodium: 135 mmol/L (ref 134–144)
Total Protein: 6.2 g/dL (ref 6.0–8.5)

## 2019-11-24 LAB — GC/CHLAMYDIA PROBE AMP (~~LOC~~) NOT AT ARMC
Chlamydia: NEGATIVE
Comment: NEGATIVE
Comment: NORMAL
Neisseria Gonorrhea: NEGATIVE

## 2019-11-24 LAB — CBC
Hematocrit: 33.7 % — ABNORMAL LOW (ref 34.0–46.6)
Hemoglobin: 11 g/dL — ABNORMAL LOW (ref 11.1–15.9)
MCH: 25.6 pg — ABNORMAL LOW (ref 26.6–33.0)
MCHC: 32.6 g/dL (ref 31.5–35.7)
MCV: 79 fL (ref 79–97)
Platelets: 224 10*3/uL (ref 150–450)
RBC: 4.29 x10E6/uL (ref 3.77–5.28)
RDW: 14.3 % (ref 11.7–15.4)
WBC: 6.4 10*3/uL (ref 3.4–10.8)

## 2019-11-24 NOTE — Telephone Encounter (Signed)
Preadmission screen  

## 2019-11-25 ENCOUNTER — Other Ambulatory Visit: Payer: Self-pay | Admitting: Obstetrics & Gynecology

## 2019-11-25 ENCOUNTER — Other Ambulatory Visit: Payer: Self-pay | Admitting: Advanced Practice Midwife

## 2019-11-25 ENCOUNTER — Inpatient Hospital Stay (HOSPITAL_COMMUNITY)
Admission: AD | Admit: 2019-11-25 | Discharge: 2019-11-25 | Disposition: A | Discharge status: Home / Self Care | Payer: Medicaid Other | Attending: Obstetrics & Gynecology | Admitting: Obstetrics & Gynecology

## 2019-11-25 ENCOUNTER — Encounter (HOSPITAL_COMMUNITY): Payer: Self-pay | Admitting: Obstetrics & Gynecology

## 2019-11-25 ENCOUNTER — Telehealth (HOSPITAL_COMMUNITY): Payer: Self-pay | Admitting: *Deleted

## 2019-11-25 DIAGNOSIS — O10013 Pre-existing essential hypertension complicating pregnancy, third trimester: Secondary | ICD-10-CM | POA: Diagnosis not present

## 2019-11-25 DIAGNOSIS — O99891 Other specified diseases and conditions complicating pregnancy: Secondary | ICD-10-CM | POA: Diagnosis not present

## 2019-11-25 DIAGNOSIS — O26893 Other specified pregnancy related conditions, third trimester: Secondary | ICD-10-CM | POA: Diagnosis not present

## 2019-11-25 DIAGNOSIS — Z79899 Other long term (current) drug therapy: Secondary | ICD-10-CM | POA: Diagnosis not present

## 2019-11-25 DIAGNOSIS — N898 Other specified noninflammatory disorders of vagina: Secondary | ICD-10-CM

## 2019-11-25 DIAGNOSIS — O4703 False labor before 37 completed weeks of gestation, third trimester: Secondary | ICD-10-CM | POA: Insufficient documentation

## 2019-11-25 DIAGNOSIS — O09293 Supervision of pregnancy with other poor reproductive or obstetric history, third trimester: Secondary | ICD-10-CM | POA: Diagnosis not present

## 2019-11-25 DIAGNOSIS — Z3A36 36 weeks gestation of pregnancy: Secondary | ICD-10-CM | POA: Insufficient documentation

## 2019-11-25 DIAGNOSIS — O09523 Supervision of elderly multigravida, third trimester: Secondary | ICD-10-CM | POA: Diagnosis not present

## 2019-11-25 DIAGNOSIS — Z87891 Personal history of nicotine dependence: Secondary | ICD-10-CM | POA: Diagnosis not present

## 2019-11-25 DIAGNOSIS — O479 False labor, unspecified: Secondary | ICD-10-CM

## 2019-11-25 DIAGNOSIS — Z7982 Long term (current) use of aspirin: Secondary | ICD-10-CM | POA: Diagnosis not present

## 2019-11-25 DIAGNOSIS — Z8759 Personal history of other complications of pregnancy, childbirth and the puerperium: Secondary | ICD-10-CM | POA: Diagnosis not present

## 2019-11-25 DIAGNOSIS — O24419 Gestational diabetes mellitus in pregnancy, unspecified control: Secondary | ICD-10-CM | POA: Diagnosis not present

## 2019-11-25 DIAGNOSIS — R109 Unspecified abdominal pain: Secondary | ICD-10-CM

## 2019-11-25 DIAGNOSIS — R1030 Lower abdominal pain, unspecified: Secondary | ICD-10-CM | POA: Insufficient documentation

## 2019-11-25 NOTE — Discharge Instructions (Signed)
Labor and Vaginal Delivery  Vaginal delivery means that you give birth by pushing your baby out of your birth canal (vagina). A team of health care providers will help you before, during, and after vaginal delivery. Birth experiences are unique for every woman and every pregnancy, and birth experiences vary depending on where you choose to give birth. What happens when I arrive at the birth center or hospital? Once you are in labor and have been admitted into the hospital or birth center, your health care provider may:  Review your pregnancy history and any concerns that you have.  Insert an IV into one of your veins. This may be used to give you fluids and medicines.  Check your blood pressure, pulse, temperature, and heart rate (vital signs).  Check whether your bag of water (amniotic sac) has broken (ruptured).  Talk with you about your birth plan and discuss pain control options. Monitoring Your health care provider may monitor your contractions (uterine monitoring) and your baby's heart rate (fetal monitoring). You may need to be monitored:  Often, but not continuously (intermittently).  All the time or for long periods at a time (continuously). Continuous monitoring may be needed if: ? You are taking certain medicines, such as medicine to relieve pain or make your contractions stronger. ? You have pregnancy or labor complications. Monitoring may be done by:  Placing a special stethoscope or a handheld monitoring device on your abdomen to check your baby's heartbeat and to check for contractions.  Placing monitors on your abdomen (external monitors) to record your baby's heartbeat and the frequency and length of contractions.  Placing monitors inside your uterus through your vagina (internal monitors) to record your baby's heartbeat and the frequency, length, and strength of your contractions. Depending on the type of monitor, it may remain in your uterus or on your baby's head  until birth.  Telemetry. This is a type of continuous monitoring that can be done with external or internal monitors. Instead of having to stay in bed, you are able to move around during telemetry. Physical exam Your health care provider may perform frequent physical exams. This may include:  Checking how and where your baby is positioned in your uterus.  Checking your cervix to determine: ? Whether it is thinning out (effacing). ? Whether it is opening up (dilating). What happens during labor and delivery?  Normal labor and delivery is divided into the following three stages: Stage 1  This is the longest stage of labor.  This stage can last for hours or days.  Throughout this stage, you will feel contractions. Contractions generally feel mild, infrequent, and irregular at first. They get stronger, more frequent (about every 2-3 minutes), and more regular as you move through this stage.  This stage ends when your cervix is completely dilated to 4 inches (10 cm) and completely effaced. Stage 2  This stage starts once your cervix is completely effaced and dilated and lasts until the delivery of your baby.  This stage may last from 20 minutes to 2 hours.  This is the stage where you will feel an urge to push your baby out of your vagina.  You may feel stretching and burning pain, especially when the widest part of your baby's head passes through the vaginal opening (crowning).  Once your baby is delivered, the umbilical cord will be clamped and cut. This usually occurs after waiting a period of 1-2 minutes after delivery.  Your baby will be placed on your   bare chest (skin-to-skin contact) in an upright position and covered with a warm blanket. Watch your baby for feeding cues, like rooting or sucking, and help the baby to your breast for his or her first feeding. Stage 3  This stage starts immediately after the birth of your baby and ends after you deliver the placenta.  This  stage may take anywhere from 5 to 30 minutes.  After your baby has been delivered, you will feel contractions as your body expels the placenta and your uterus contracts to control bleeding. What can I expect after labor and delivery?  After labor is over, you and your baby will be monitored closely until you are ready to go home to ensure that you are both healthy. Your health care team will teach you how to care for yourself and your baby.  You and your baby will stay in the same room (rooming in) during your hospital stay. This will encourage early bonding and successful breastfeeding.  You may continue to receive fluids and medicines through an IV.  Your uterus will be checked and massaged regularly (fundal massage).  You will have some soreness and pain in your abdomen, vagina, and the area of skin between your vaginal opening and your anus (perineum).  If an incision was made near your vagina (episiotomy) or if you had some vaginal tearing during delivery, cold compresses may be placed on your episiotomy or your tear. This helps to reduce pain and swelling.  You may be given a squirt bottle to use instead of wiping when you go to the bathroom. To use the squirt bottle, follow these steps: ? Before you urinate, fill the squirt bottle with warm water. Do not use hot water. ? After you urinate, while you are sitting on the toilet, use the squirt bottle to rinse the area around your urethra and vaginal opening. This rinses away any urine and blood. ? Fill the squirt bottle with clean water every time you use the bathroom.  It is normal to have vaginal bleeding after delivery. Wear a sanitary pad for vaginal bleeding and discharge. Summary  Vaginal delivery means that you will give birth by pushing your baby out of your birth canal (vagina).  Your health care provider may monitor your contractions (uterine monitoring) and your baby's heart rate (fetal monitoring).  Your health care  provider may perform a physical exam.  Normal labor and delivery is divided into three stages.  After labor is over, you and your baby will be monitored closely until you are ready to go home. This information is not intended to replace advice given to you by your health care provider. Make sure you discuss any questions you have with your health care provider. Document Revised: 08/13/2017 Document Reviewed: 08/13/2017 Elsevier Patient Education  2020 Elsevier Inc.  

## 2019-11-25 NOTE — MAU Note (Addendum)
PT SAYS SHE HAS BROWN D/C  AND LOWER ABD PAIN - AND THIGHS. PNC WITH CLINIC.  VE YESTERDAY 1 CM . INDUCTION ON Sunday - FOR HIGH BP AND HIGH  SUGAR. PT SAYS HAS HX OF HSV - LAST OUTBREAK WAS 1-2 WEEKS AGO.   DENIES ANY S/S NOW- TAKES VALTREX DAILY ,   HER BLOOD SUGAR AT 11PM- WAS 102.

## 2019-11-25 NOTE — MAU Provider Note (Signed)
Chief Complaint:  Abdominal Pain   First Provider Initiated Contact with Patient 11/25/19 0158     HPI: Yesenia Weeks is a 44 y.o. [id number] at 17w2dho presents to maternity admissions reporting brown discharge with intermittent contractions.  Had a large Subchorionic hemorrhage in January which resolved by February.  Was worried that this brown discharge was bleeding from an abruption.  Did get checked yesterday.  Plans on IOL Sunday for HTN and GDM. .Marland KitchenShe reports good fetal movement, denies LOF, vaginal bleeding, vaginal itching/burning, urinary symptoms, h/a, dizziness, n/v, diarrhea, constipation or fever/chills.  She denies headache, visual changes or RUQ abdominal pain.  Abdominal Pain This is a recurrent problem. The current episode started yesterday. The onset quality is gradual. The problem occurs intermittently. The problem has been unchanged. The quality of the pain is cramping. The abdominal pain does not radiate. Pertinent negatives include no constipation, diarrhea, fever, frequency, myalgias, nausea or vomiting. Nothing aggravates the pain. The pain is relieved by nothing. She has tried nothing for the symptoms.  Vaginal Discharge The patient's primary symptoms include vaginal discharge. The patient's pertinent negatives include no genital itching, genital lesions, genital odor or vaginal bleeding (brown discharge, ? blood). This is a new problem. The current episode started today. The problem occurs intermittently. The problem has been resolved. The pain is mild. She is pregnant. Associated symptoms include abdominal pain. Pertinent negatives include no chills, constipation, diarrhea, fever, frequency, nausea or vomiting. The vaginal discharge was brown and mucoid. She has not been passing clots. She has not been passing tissue. Nothing aggravates the symptoms. She has tried nothing for the symptoms.    RN Note: PT SAYS SHE HAS BROWN D/C  AND LOWER ABD PAIN - AND THIGHS. PNC WITH CLINIC.   VE YESTERDAY 1 CM . INDUCTION ON Sunday - FOR HIGH BP AND HIGH  SUGAR. PT SAYS HAS HX OF HSV - LAST OUTBREAK WAS 1-2 WEEKS AGO.   DENIES ANY S/S NOW- TAKES VALTREX DAILY ,   HER BLOOD SUGAR AT 11PM- WAS 102  Past Medical History: Past Medical History:  Diagnosis Date  . ADHD (attention deficit hyperactivity disorder)   . Allergy   . Anemia    iron def  . Anxiety   . BV (bacterial vaginosis)    recurrent-uses boric acid  . Depression   . Dysmenorrhea   . Gestational diabetes   . HSV infection   . Preterm labor   [place]     Past obstetric history: OB History  Gravida Para Term Preterm AB Living  '5 4 3 1 ' 0 3  SAB TAB Ectopic Multiple Live Births  0     0 3    # Outcome Date GA Lbr Len/2nd Weight Sex Delivery Anes PTL Lv  5 Current           4 Term 02/23/17 34w1d0:30 / 00:17 2940 g M Vag-Spont EPI  LIV     Birth Comments: IUGR  3 Term 02/26/02 4032w0d722 g F Vag-Spont EPI Y LIV     Birth Comments: Preterm labor,   2 Term 05/08/98 39w63w0d22 g F Vag-Spont None Y LIV     Birth Comments: preterm labor,   1 Preterm 1998 20w03w0dNone  FD     Complications: Gonorrhea affecting pregnancy in second trimester, Chlamydia infection during pregnancy    Past Surgical History: Past Surgical History:  Procedure Laterality Date  . HERNIA REPAIR  x2    Family History: Family History  Problem Relation Age of Onset  . Hypertension Mother   . Stroke Mother   . Hypertension Father   . COPD Father   . Cancer Maternal Aunt        breast  . Breast cancer Maternal Aunt   . Diabetes Maternal Grandmother   . Hypertension Maternal Grandmother   . Mental illness Maternal Grandmother   . Cancer Maternal Grandmother   . Breast cancer Maternal Grandmother   . Diabetes Paternal Grandmother   . Kidney disease Paternal Grandmother   . Mental illness Paternal Grandmother   . Hypertension Paternal Grandmother   . Breast cancer Maternal Aunt     Social  History: Social History   Tobacco Use  . Smoking status: Former Smoker    Types: Cigarettes    Quit date: 06/22/2019    Years since quitting: 0.4  . Smokeless tobacco: Never Used  Substance Use Topics  . Alcohol use: Not Currently    Comment: occasional until + upt  . Drug use: Not Currently    Types: Cocaine    Comment: last used 2018    Allergies: No Known Allergies  Meds:  Medications Prior to Admission  Medication Sig Dispense Refill Last Dose  . Accu-Chek Softclix Lancets lancets Use as instructed 4 times daily 100 each 12 11/24/2019 at Unknown time  . acetaminophen (TYLENOL) 500 MG tablet Take 500 mg by mouth every 6 (six) hours as needed for mild pain or headache.   Past Week at Unknown time  . Blood Glucose Monitoring Suppl (ACCU-CHEK GUIDE) w/Device KIT 1 Device by Does not apply route as needed. 1 kit 0 11/24/2019 at Unknown time  . Blood Pressure Monitoring (BLOOD PRESSURE KIT) DEVI 1 Device by Does not apply route as needed. 1 each 0 11/24/2019 at Unknown time  . cetirizine (ZYRTEC) 10 MG tablet TAKE 1 TABLET(10 MG) BY MOUTH DAILY AS NEEDED 30 tablet 3 11/24/2019 at Unknown time  . fluticasone (FLONASE) 50 MCG/ACT nasal spray Place 1 spray into both nostrils daily. 16 g 3 Past Week at Unknown time  . Prenatal 27-1 MG TABS Take 1 tablet by mouth daily. 90 tablet 3 11/24/2019 at Unknown time  . valACYclovir (VALTREX) 500 MG tablet Take 1 tablet (500 mg total) by mouth 2 (two) times daily. 30 tablet 3 11/24/2019 at Unknown time  . albuterol (VENTOLIN HFA) 108 (90 Base) MCG/ACT inhaler INHALE 2 PUFFS INTO THE LUNGS EVERY 6 HOURS FOR UP TO 14 DAYS AS NEEDED FOR WHEEZING OR SHORTNESS OF BREATH (Patient not taking: No sig reported) 6.7 g 0   . aspirin EC 81 MG tablet Take 1 tablet (81 mg total) by mouth daily. Take after 12 weeks for prevention of preeclampsia later in pregnancy 300 tablet 2 NOT TAKE  . Elastic Bandages & Supports (COMFORT FIT MATERNITY SUPP LG) MISC 1 Device by Does not  apply route as needed. (Patient not taking: Reported on 11/23/2019) 1 each 0   . Elastic Bandages & Supports (WRIST SPLINT/COCK-UP/LEFT M) MISC 1 Device by Does not apply route as needed. 1 each 0   . ferrous sulfate (FERROUSUL) 325 (65 FE) MG tablet Take 1 tablet (325 mg total) by mouth 2 (two) times daily. 60 tablet 3 NOT TAKE  . glucose blood test strip Use as instructed 4 times daily 100 each 12     I have reviewed patient's Past Medical Hx, Surgical Hx, Family Hx, Social Hx, medications and allergies.  ROS:  Review of Systems  Constitutional: Negative for chills and fever.  Gastrointestinal: Positive for abdominal pain. Negative for constipation, diarrhea, nausea and vomiting.  Genitourinary: Positive for vaginal discharge. Negative for frequency.  Musculoskeletal: Negative for myalgias.   Other systems negative  Physical Exam   Patient Vitals for the past 24 hrs:  BP Temp Temp src Pulse Resp Height Weight  11/25/19 0142 134/80 -- -- 80 18 -- --  11/25/19 0113 124/76 98.6 F (37 C) Oral 88 20 '5\' 7"'  (1.702 m) 95.6 kg   Constitutional: Well-developed, well-nourished female in no acute distress.  Cardiovascular: normal rate and rhythm Respiratory: normal effort, clear to auscultation bilaterally GI: Abd soft, non-tender, gravid appropriate for gestational age.   No rebound or guarding. MS: Extremities nontender, no edema, normal ROM Neurologic: Alert and oriented x 4.  GU: Neg CVAT.  PELVIC EXAM: Cervix pink, visually closed, without lesion, small amount of white/tan discharge, no blood or brown discharge noted.  external genitalia normal Dilation: 1 Effacement (%): 0 Cervical Position: Posterior Station: Ballotable Exam by:: Brittne Kawasaki CNM  FHT:  Baseline 130 , moderate variability, accelerations present, no decelerations Contractions: q 10 mins Irregular     Labs: No results found for this or any previous visit (from the past 24 hour(s)). --/--/A POS, A POS (01/24  1708)  Imaging:    MAU Course/MDM: I have ordered labs and reviewed results.  NST reviewed and is initially nonreactive but soon thereafter it became reactive with average variability and accelerations.  Treatments in MAU included EFM.    Assessment: Single IUP at 53w2dBrown discharge, now resolved Irregular occasional contractions  Plan: Discharge home Labor precautions and fetal kick counts Keep appt for IOL this weekend Follow up in Office for prenatal visits   Encouraged to return here or to other Urgent Care/ED if she develops worsening of symptoms, increase in pain, fever, or other concerning symptoms.   Pt stable at time of discharge.  MHansel FeinsteinCNM, MSN Certified Nurse-Midwife 11/25/2019 1:58 AM

## 2019-11-25 NOTE — Telephone Encounter (Signed)
Preadmission screen  

## 2019-11-26 ENCOUNTER — Ambulatory Visit (HOSPITAL_COMMUNITY): Payer: Medicaid Other | Attending: Obstetrics and Gynecology

## 2019-11-26 ENCOUNTER — Other Ambulatory Visit: Payer: Self-pay | Admitting: Advanced Practice Midwife

## 2019-11-26 ENCOUNTER — Ambulatory Visit: Payer: Medicaid Other | Admitting: *Deleted

## 2019-11-26 ENCOUNTER — Encounter: Payer: Self-pay | Admitting: *Deleted

## 2019-11-26 ENCOUNTER — Other Ambulatory Visit: Payer: Self-pay

## 2019-11-26 DIAGNOSIS — O09523 Supervision of elderly multigravida, third trimester: Secondary | ICD-10-CM | POA: Insufficient documentation

## 2019-11-26 DIAGNOSIS — Z8759 Personal history of other complications of pregnancy, childbirth and the puerperium: Secondary | ICD-10-CM | POA: Diagnosis present

## 2019-11-26 DIAGNOSIS — O0990 Supervision of high risk pregnancy, unspecified, unspecified trimester: Secondary | ICD-10-CM

## 2019-11-26 DIAGNOSIS — F1911 Other psychoactive substance abuse, in remission: Secondary | ICD-10-CM | POA: Diagnosis present

## 2019-11-26 DIAGNOSIS — O09529 Supervision of elderly multigravida, unspecified trimester: Secondary | ICD-10-CM | POA: Insufficient documentation

## 2019-11-26 DIAGNOSIS — O24410 Gestational diabetes mellitus in pregnancy, diet controlled: Secondary | ICD-10-CM

## 2019-11-26 DIAGNOSIS — Z3A36 36 weeks gestation of pregnancy: Secondary | ICD-10-CM

## 2019-11-26 LAB — CULTURE, BETA STREP (GROUP B ONLY): Strep Gp B Culture: POSITIVE — AB

## 2019-11-27 ENCOUNTER — Other Ambulatory Visit: Payer: Self-pay

## 2019-11-27 ENCOUNTER — Inpatient Hospital Stay (HOSPITAL_COMMUNITY)
Admission: AD | Admit: 2019-11-27 | Payer: Medicaid Other | Source: Home / Self Care | Admitting: Obstetrics and Gynecology

## 2019-11-27 ENCOUNTER — Inpatient Hospital Stay (HOSPITAL_COMMUNITY)
Admission: AD | Admit: 2019-11-27 | Discharge: 2019-11-27 | Disposition: A | Discharge status: Home / Self Care | Payer: Medicaid Other | Attending: Obstetrics and Gynecology | Admitting: Obstetrics and Gynecology

## 2019-11-27 ENCOUNTER — Encounter (HOSPITAL_COMMUNITY): Payer: Self-pay | Admitting: Family Medicine

## 2019-11-27 DIAGNOSIS — Z7982 Long term (current) use of aspirin: Secondary | ICD-10-CM | POA: Diagnosis not present

## 2019-11-27 DIAGNOSIS — O0990 Supervision of high risk pregnancy, unspecified, unspecified trimester: Secondary | ICD-10-CM

## 2019-11-27 DIAGNOSIS — Z3689 Encounter for other specified antenatal screening: Secondary | ICD-10-CM | POA: Insufficient documentation

## 2019-11-27 DIAGNOSIS — O4703 False labor before 37 completed weeks of gestation, third trimester: Secondary | ICD-10-CM | POA: Insufficient documentation

## 2019-11-27 DIAGNOSIS — O368931 Maternal care for other specified fetal problems, third trimester, fetus 1: Secondary | ICD-10-CM

## 2019-11-27 DIAGNOSIS — O98513 Other viral diseases complicating pregnancy, third trimester: Secondary | ICD-10-CM | POA: Diagnosis not present

## 2019-11-27 DIAGNOSIS — B009 Herpesviral infection, unspecified: Secondary | ICD-10-CM | POA: Insufficient documentation

## 2019-11-27 DIAGNOSIS — Z79899 Other long term (current) drug therapy: Secondary | ICD-10-CM | POA: Insufficient documentation

## 2019-11-27 DIAGNOSIS — Z87891 Personal history of nicotine dependence: Secondary | ICD-10-CM | POA: Insufficient documentation

## 2019-11-27 DIAGNOSIS — O368130 Decreased fetal movements, third trimester, not applicable or unspecified: Secondary | ICD-10-CM | POA: Diagnosis not present

## 2019-11-27 DIAGNOSIS — Z3A36 36 weeks gestation of pregnancy: Secondary | ICD-10-CM | POA: Diagnosis not present

## 2019-11-27 DIAGNOSIS — O24410 Gestational diabetes mellitus in pregnancy, diet controlled: Secondary | ICD-10-CM | POA: Insufficient documentation

## 2019-11-27 DIAGNOSIS — Z8759 Personal history of other complications of pregnancy, childbirth and the puerperium: Secondary | ICD-10-CM

## 2019-11-27 DIAGNOSIS — O133 Gestational [pregnancy-induced] hypertension without significant proteinuria, third trimester: Secondary | ICD-10-CM

## 2019-11-27 DIAGNOSIS — O09523 Supervision of elderly multigravida, third trimester: Secondary | ICD-10-CM

## 2019-11-27 DIAGNOSIS — F1911 Other psychoactive substance abuse, in remission: Secondary | ICD-10-CM

## 2019-11-27 HISTORY — DX: Unspecified convulsions: R56.9

## 2019-11-27 HISTORY — DX: Unspecified asthma, uncomplicated: J45.909

## 2019-11-27 HISTORY — DX: Unspecified infectious disease: B99.9

## 2019-11-27 HISTORY — DX: Unspecified ovarian cyst, unspecified side: N83.209

## 2019-11-27 NOTE — Discharge Instructions (Signed)
Fetal Movement Counts Patient Name: ________________________________________________ Patient Due Date: ____________________ What is a fetal movement count?  A fetal movement count is the number of times that you feel your baby move during a certain amount of time. This may also be called a fetal kick count. A fetal movement count is recommended for every pregnant woman. You may be asked to start counting fetal movements as early as week 28 of your pregnancy. Pay attention to when your baby is most active. You may notice your baby's sleep and wake cycles. You may also notice things that make your baby move more. You should do a fetal movement count:  When your baby is normally most active.  At the same time each day. A good time to count movements is while you are resting, after having something to eat and drink. How do I count fetal movements? 1. Find a quiet, comfortable area. Sit, or lie down on your side. 2. Write down the date, the start time and stop time, and the number of movements that you felt between those two times. Take this information with you to your health care visits. 3. Write down your start time when you feel the first movement. 4. Count kicks, flutters, swishes, rolls, and jabs. You should feel at least 10 movements. 5. You may stop counting after you have felt 10 movements, or if you have been counting for 2 hours. Write down the stop time. 6. If you do not feel 10 movements in 2 hours, contact your health care provider for further instructions. Your health care provider may want to do additional tests to assess your baby's well-being. Contact a health care provider if:  You feel fewer than 10 movements in 2 hours.  Your baby is not moving like he or she usually does. Date: ____________ Start time: ____________ Stop time: ____________ Movements: ____________ Date: ____________ Start time: ____________ Stop time: ____________ Movements: ____________ Date: ____________  Start time: ____________ Stop time: ____________ Movements: ____________ Date: ____________ Start time: ____________ Stop time: ____________ Movements: ____________ Date: ____________ Start time: ____________ Stop time: ____________ Movements: ____________ Date: ____________ Start time: ____________ Stop time: ____________ Movements: ____________ Date: ____________ Start time: ____________ Stop time: ____________ Movements: ____________ Date: ____________ Start time: ____________ Stop time: ____________ Movements: ____________ Date: ____________ Start time: ____________ Stop time: ____________ Movements: ____________ This information is not intended to replace advice given to you by your health care provider. Make sure you discuss any questions you have with your health care provider. Document Revised: 02/26/2019 Document Reviewed: 02/26/2019 Elsevier Patient Education  2020 Elsevier Inc.  

## 2019-11-27 NOTE — MAU Provider Note (Signed)
History     CSN: [id number]  Arrival date and time: 11/27/19 1501   First Provider Initiated Contact with Patient 11/27/19 1638      Chief Complaint  Patient presents with  . Contractions  . check fo outbreak   44 y.o. [id number] '@36' .4 wks presenting with decreased FM. Reports less FM over the last few weeks. She is feeling movement but not as frequent. Also concerned that she had a HSV outbreak 2 weeks ago and has not been taking her Valtrex consistently. States she has taken it for the last few days. Her pregnancy is complicated by R4ERX and AMA.   OB History    Gravida  5   Para  4   Term  3   Preterm  1   AB  0   Living  3     SAB  0   TAB      Ectopic      Multiple  0   Live Births  3           Past Medical History:  Diagnosis Date  . ADHD (attention deficit hyperactivity disorder)   . Allergy   . Anemia    iron def  . Anxiety    stressed with preg, worry about baby  . Asthma   . BV (bacterial vaginosis)    recurrent-uses boric acid  . Depression   . Dysmenorrhea   . Gestational diabetes   . HSV infection   . Infection    UTI  . Ovarian cyst    left, states was removed  . Preterm labor   . Seizures ([place])    up until age 45  . Substance abuse [facility])     Past Surgical History:  Procedure Laterality Date  . HERNIA REPAIR     x2    Family History  Problem Relation Age of Onset  . Hypertension Mother   . Stroke Mother   . Hypertension Father   . COPD Father   . Cancer Maternal Aunt        breast  . Breast cancer Maternal Aunt   . Diabetes Maternal Grandmother   . Hypertension Maternal Grandmother   . Mental illness Maternal Grandmother   . Cancer Maternal Grandmother   . Breast cancer Maternal Grandmother   . Diabetes Paternal Grandmother   . Kidney disease Paternal Grandmother   . Mental illness Paternal Grandmother   . Hypertension Paternal Grandmother   . Breast cancer Maternal Aunt     Social History   Tobacco Use  .  Smoking status: Former Smoker    Types: Cigarettes    Quit date: 06/22/2019    Years since quitting: 0.4  . Smokeless tobacco: Never Used  Substance Use Topics  . Alcohol use: Not Currently    Comment: occasional until + upt  . Drug use: Not Currently    Types: Cocaine    Comment: last used 2018    Allergies: No Known Allergies  No medications prior to admission.    Review of Systems  Gastrointestinal: Negative for abdominal pain.  Genitourinary: Negative for vaginal bleeding and vaginal discharge.   Physical Exam   Blood pressure 135/85, pulse 89, temperature 99.1 F (37.3 C), temperature source Oral, resp. rate 18, height '5\' 7"'  (1.702 m), weight 93.8 kg, last menstrual period 05/18/2019, SpO2 100 %, currently breastfeeding.  Physical Exam  Nursing note and vitals reviewed. Constitutional: She is oriented to person, place, and time. She appears well-developed and well-nourished.  No distress.  HENT:  Head: Normocephalic and atraumatic.  Cardiovascular: Normal rate.  Respiratory: Effort normal. No respiratory distress.  GI: Soft. She exhibits no distension. There is no abdominal tenderness.  gravid  Musculoskeletal:        General: Normal range of motion.     Cervical back: Normal range of motion.  Neurological: She is alert and oriented to person, place, and time.  Skin: Skin is warm and dry.  Psychiatric: She has a normal mood and affect.  EFM: 125 bpm, mod variability, + accels, no decels Toco: irregular  No results found for this or any previous visit (from the past 24 hour(s)).  MAU Course  Procedures  MDM Consult with Dr. Elonda Husky regarding recent HSV outbreak and upcoming IOL for gHTN. Dr. Elonda Husky in to counsel pt. Plan for IOL at 38 wks instead of 37. Plan for appt in office on Monday and MFM appt on Thursday next week. Pt instructed on importance of consistent use of Valtrex. Pt feeling FM but was different in consistency of FM felt a few weeks ago, expectations of  FM in late pregnancy discussed by Dr. Elonda Husky and myself. Stable for discharge home.   Assessment and Plan  [redacted] weeks gestation Decreased fetal movement Reactive NST Hx of HSV Discharge home Follow [facility] at [facility] on 11/30/19- message sent [facility] Return precautions Continue Valtrex  Allergies as of 11/27/2019   No Known Allergies     Medication List    TAKE these medications   Accu-Chek Guide w/Device Kit 1 Device by Does not apply route as needed.   Accu-Chek Softclix Lancets lancets Use as instructed 4 times daily   acetaminophen 500 MG tablet Commonly known as: TYLENOL Take 500 mg by mouth every 6 (six) hours as needed for mild pain or headache.   albuterol 108 (90 Base) MCG/ACT inhaler Commonly known as: VENTOLIN HFA INHALE 2 PUFFS INTO THE LUNGS EVERY 6 HOURS FOR UP TO 14 DAYS AS NEEDED FOR WHEEZING OR SHORTNESS OF BREATH   aspirin EC 81 MG tablet Take 1 tablet (81 mg total) by mouth daily. Take after 12 weeks for prevention of preeclampsia later in pregnancy   Blood Pressure Kit Devi 1 Device by Does not apply route as needed.   cetirizine 10 MG tablet Commonly known as: ZYRTEC TAKE 1 TABLET(10 MG) BY MOUTH DAILY AS NEEDED   Comfort Fit Maternity Supp Lg Misc 1 Device by Does not apply route as needed.   Wrist Splint/Cock-Up/Left M Misc 1 Device by Does not apply route as needed.   ferrous sulfate 325 (65 FE) MG tablet Commonly known as: FerrouSul Take 1 tablet (325 mg total) by mouth 2 (two) times daily.   fluticasone 50 MCG/ACT nasal spray Commonly known as: Flonase Place 1 spray into both nostrils daily.   glucose blood test strip Use as instructed 4 times daily   Prenatal 27-1 MG Tabs Take 1 tablet by mouth daily.   valACYclovir 500 MG tablet Commonly known as: Valtrex Take 1 tablet (500 mg total) by mouth 2 (two) times daily.       Julianne Handler, CNM 11/27/2019, 9:18 PM

## 2019-11-27 NOTE — MAU Note (Signed)
2wks ago had an out break.  Is planning on vag  Del.  Coming in [place] for induction.  Has started back on Valtrex.  Was told to come in to make sure she is clear prior to induction.  Does not believe she is having an outbreak.   Having occ contractions.

## 2019-11-28 ENCOUNTER — Other Ambulatory Visit (HOSPITAL_COMMUNITY): Admission: RE | Admit: 2019-11-28 | Payer: Medicaid Other | Source: Ambulatory Visit

## 2019-11-30 ENCOUNTER — Inpatient Hospital Stay (HOSPITAL_COMMUNITY): Admission: AD | Admit: 2019-11-30 | Payer: Medicaid Other | Source: Home / Self Care | Admitting: Family Medicine

## 2019-11-30 ENCOUNTER — Encounter: Payer: Self-pay | Admitting: Obstetrics & Gynecology

## 2019-11-30 ENCOUNTER — Other Ambulatory Visit: Payer: Self-pay | Admitting: Family Medicine

## 2019-11-30 ENCOUNTER — Inpatient Hospital Stay (HOSPITAL_COMMUNITY): Payer: Medicaid Other

## 2019-11-30 ENCOUNTER — Telehealth (HOSPITAL_COMMUNITY): Payer: Self-pay | Admitting: *Deleted

## 2019-11-30 ENCOUNTER — Ambulatory Visit (INDEPENDENT_AMBULATORY_CARE_PROVIDER_SITE_OTHER): Payer: Medicaid Other | Admitting: Obstetrics & Gynecology

## 2019-11-30 ENCOUNTER — Other Ambulatory Visit: Payer: Self-pay

## 2019-11-30 VITALS — BP 134/85 | HR 84 | Wt 209.5 lb

## 2019-11-30 DIAGNOSIS — O09293 Supervision of pregnancy with other poor reproductive or obstetric history, third trimester: Secondary | ICD-10-CM

## 2019-11-30 DIAGNOSIS — O0990 Supervision of high risk pregnancy, unspecified, unspecified trimester: Secondary | ICD-10-CM

## 2019-11-30 DIAGNOSIS — A6009 Herpesviral infection of other urogenital tract: Secondary | ICD-10-CM | POA: Insufficient documentation

## 2019-11-30 DIAGNOSIS — O24410 Gestational diabetes mellitus in pregnancy, diet controlled: Secondary | ICD-10-CM

## 2019-11-30 DIAGNOSIS — O98313 Other infections with a predominantly sexual mode of transmission complicating pregnancy, third trimester: Secondary | ICD-10-CM

## 2019-11-30 DIAGNOSIS — Z3A37 37 weeks gestation of pregnancy: Secondary | ICD-10-CM

## 2019-11-30 DIAGNOSIS — Z8759 Personal history of other complications of pregnancy, childbirth and the puerperium: Secondary | ICD-10-CM

## 2019-11-30 DIAGNOSIS — O09523 Supervision of elderly multigravida, third trimester: Secondary | ICD-10-CM

## 2019-11-30 NOTE — Progress Notes (Signed)
PRENATAL VISIT NOTE  Subjective:  Yesenia Weeks is a 44 y.o. [id number] at 1738w0d being seen today for ongoing prenatal care.  She is currently monitored for the following issues for this high-risk pregnancy and has ANEMIA, IRON DEFICIENCY, UNSPEC.; Mood disorder (HCC); Allergic rhinitis; ASTEATOTIC ECZEMA; HALLUX VALGUS, ACQUIRED; Genital herpes; Family history of breast cancer; Anxiety state; Tenosynovitis, de Quervain; Bacterial vaginosis; Cold sore; Canker sores oral; Supervision of high risk pregnancy, antepartum; Depression; AMA (advanced maternal age) multigravida 35+; History of IUFD; History of prior pregnancy with SGA newborn; History of substance abuse (HCC); Antepartum bleeding, second trimester; Gestational diabetes mellitus (GDM), antepartum; and Genital herpes affecting pregnancy in third trimester on their problem list.  Patient reports no complaints.  Contractions: Not present. Vag. Bleeding: None.  Movement: Present. Denies leaking of fluid.   The following portions of the patient's history were reviewed and updated as appropriate: allergies, current medications, past family history, past medical history, past social history, past surgical history and problem list.   Objective:   Vitals:   11/30/19 0924  BP: 134/85  Pulse: 84  Weight: 209 lb 8 oz (95 kg)    Fetal Status: Fetal Heart Rate (bpm): 147   Movement: Present  Presentation: Vertex  General:  Alert, oriented and cooperative. Patient is in no acute distress.  Skin: Skin is warm and dry. No rash noted.   Cardiovascular: Normal heart rate noted  Respiratory: Normal respiratory effort, no problems with respiration noted  Abdomen: Soft, gravid, appropriate for gestational age.  Pain/Pressure: Present     Pelvic: Cervical exam performed with a chaperone present. Dilation: 1 Effacement (%): Thick Station: Ballotable. No lesions noted on vulva, vagina and perineum.   Extremities: Normal range of motion.  Edema: Trace   Mental Status: Normal mood and affect. Normal behavior. Normal judgment and thought content.   US MFM FETAL BPP WO NON STRESS  Result Date: 11/26/2019 ----------------------------------------------------------------------  OBSTETRICS REPORT                       (Signed Final 11/26/2019 02:22 pm) ---------------------------------------------------------------------- Patient Info  ID #:       [id number]                          D.O.B.:  1975-08-30 (43 yrs)  Name:       Yesenia CarneAPRIL Siordia                    Visit Date: 11/26/2019 11:23 am ---------------------------------------------------------------------- Performed By  Attending:        Noralee Spaceavi Shankar MD        [facility]  Performed By:     Burtis JunesKasie E Kiser BS,      Tertiary Phy.:    Dorathy KinsmanVIRGINIA SMITH                    RDMS                                                             CNM  Referred By:      Family Practice        Address:          [address] [place]  [facility]                                                             [address]                                                             [place]                                                             [id number]  Ref. Address:     [address]         Location:         [facility]                                                             Fetal Care ---------------------------------------------------------------------- Orders  #  Description                           Code        Ordered By  1  [place] MFM FETAL BPP WO NON               [phone]    RAVI [facility]     STRESS ----------------------------------------------------------------------  #  Order #                     Accession #                Episode #  1  [id number]                   [id number]                 [id number] ---------------------------------------------------------------------- Indications  Advanced maternal age multigravida 14+,        O10.523  third trimester (neg Mat 21)  Poor  obstetric history: Previous fetal growth  O09.299  restriction (FGR) per pt  Poor obstetric history (prior pre-term labor   O09.219  with 1st preg; 3 term since)  Subchorionic hemorrhage, antepartum            O45.90  Gestational diabetes in pregnancy, diet        O24.410  controlled  [redacted] weeks gestation of pregnancy                Z3A.36 ---------------------------------------------------------------------- Fetal Evaluation  Num Of Fetuses:         1  Fetal Heart Rate(bpm):  137  Cardiac Activity:       Observed  Presentation:  Cephalic  Placenta:               Anterior  Amniotic Fluid  AFI FV:      Within normal limits  AFI Sum(cm)     %Tile       Largest Pocket(cm)  9.7             20          4.2  RUQ(cm)       RLQ(cm)       LUQ(cm)        LLQ(cm)  4.2           1.1           2.1            2.3 ---------------------------------------------------------------------- Biophysical Evaluation  Amniotic F.V:   Pocket => 2 cm             F. Tone:        Observed  F. Movement:    Observed                   Score:          8/8  F. Breathing:   Observed ---------------------------------------------------------------------- Gestational Age  LMP:           27w 3d        Date:  05/18/19                 EDD:   02/22/20  Best:          Stevie Kern 3d     Det. By:  U/S  (06/24/19)          EDD:   12/21/19 ---------------------------------------------------------------------- Impression  Amniotic fluid is normal and good fetal activity is seen  .Antenatal testing is reassuring. BPP 8/8.  We reassured the patient of the findings.  Patient will be undergoing induction of labor on 11/30/19. ----------------------------------------------------------------------                  Noralee Space, MD Electronically Signed Final Report   11/26/2019 02:22 pm ----------------------------------------------------------------------  [place] MFM FETAL BPP WO NON STRESS  Result Date:  11/18/2019 ----------------------------------------------------------------------  OBSTETRICS REPORT                       (Signed Final 11/18/2019 10:52 am) ---------------------------------------------------------------------- Patient Info  ID #:       [id number]                          D.O.B.:  February 26, 1976 (43 yrs)  Name:       Yesenia Weeks                    Visit Date: 11/18/2019 09:28 am ---------------------------------------------------------------------- Performed By  Performed By:     Earley Brooke     Secondary Phy.:   [facility] MAU/Triage                    BS, RDMS  Attending:        Noralee Space MD        [facility] Phy.:    [facility]  CNM  Referred By:      [facility]        Address:          [address]                    [facility]                                                             Rd                                                             [place]                                                             [phone]  Ref. Address:     [address]         Location:         [facility]                                                             Fetal Care ---------------------------------------------------------------------- Orders   #  Description                          Code         Ordered By   1  [place] MFM FETAL BPP WO NON              [id number]     RAVI [facility]      STRESS  ----------------------------------------------------------------------   #  Order #                    Accession #                 Episode #   1  [id number]                  [id number]                  [id number]  ---------------------------------------------------------------------- Indications   Advanced maternal age multigravida 42+,        O59.523   third trimester (neg Mat 21)   [redacted] weeks gestation of pregnancy                Z3A.35   Poor obstetric history: Previous fetal growth  O09.299   restriction (FGR) per pt    Poor obstetric history (prior pre-term labor   O09.219   with 1st preg; 3 term since)   Subchorionic  hemorrhage, antepartum            O45.90   Gestational diabetes in pregnancy, diet        O24.410   controlled  ---------------------------------------------------------------------- Fetal Evaluation  Num Of Fetuses:         1  Fetal Heart Rate(bpm):  131  Cardiac Activity:       Observed  Presentation:           Cephalic  Amniotic Fluid  AFI FV:      Within normal limits  AFI Sum(cm)     %Tile       Largest Pocket(cm)  13.51           47          4.11  RUQ(cm)       RLQ(cm)       LUQ(cm)        LLQ(cm)  2.07          3.97          4.11           3.36 ---------------------------------------------------------------------- Biophysical Evaluation  Amniotic F.V:   Within normal limits       F. Tone:        Observed  F. Movement:    Observed                   Score:          8/8  F. Breathing:   Observed ---------------------------------------------------------------------- Gestational Age  LMP:           26w 2d        Date:  05/18/19                 EDD:   02/22/20  Best:          Consuello Closs 2d     Det. By:  U/S  (06/24/19)          EDD:   12/21/19 ---------------------------------------------------------------------- Impression  Gestational diabetes.  Patient reports she has fasting  elevations over 95 or 100 mg/dL and that some postprandial  values are elevated.  She has not brought her glucose log  book.  On today's ultrasound, amniotic fluid is normal good fetal  activity seen.Antenatal testing is reassuring. BPP 8/8.  I discussed the importance of good blood glucose control to  prevent adverse fetal or neonatal outcomes.  I discussed  Metformin or insulin treatment.  Patient will bring her blood  glucose log book her prenatal visit next week and I  recommend initiating Metformin or insulin if fasting levels are  high. ---------------------------------------------------------------------- Recommendations  Follow-up scans as  clinically indicated. ----------------------------------------------------------------------                  Noralee Space, MD Electronically Signed Final Report   11/18/2019 10:52 am ----------------------------------------------------------------------  [place] MFM FETAL BPP WO NON STRESS  Result Date: 11/12/2019 ----------------------------------------------------------------------  OBSTETRICS REPORT                       (Signed Final 11/12/2019 12:08 pm) ---------------------------------------------------------------------- Patient Info  ID #:       [id number]                          D.O.B.:  1975/12/21 (43 yrs)  Name:       Yesenia Weeks  Visit Date: 11/12/2019 11:46 am ---------------------------------------------------------------------- Performed By  Performed By:     Marcellina Millin          Tertiary Phy.:    Dorathy Kinsman                    RDMS                                                             CNM  Attending:        Noralee Space MD        Address:          [address]                                                             Rd                                                             [place]                                                             [phone]  Referred By:      Family Practice        [facility]                                      Fetal Care  Ref. Address:     [address]                    [address] ---------------------------------------------------------------------- Orders   #  Description                          Code         Ordered By   1  [place] MFM OB FOLLOW UP                  [id number]     RAVI SHANKAR   2  [place] MFM FETAL BPP WO NON              60454.09     RAVI SHANKAR      STRESS  ----------------------------------------------------------------------   #  Order #                    Accession #  Episode #   1  [id number]                  [id number]                  [id number]   2   [id number]                  [id number]                  [id number]  ---------------------------------------------------------------------- Indications   Advanced maternal age multigravida 63+,        O56.523   third trimester (neg Mat 65)   [redacted] weeks gestation of pregnancy                Z3A.34   Poor obstetric history: Previous fetal growth  O09.299   restriction (FGR) per pt   Poor obstetric history (prior pre-term labor   O09.219   with 1st preg; 3 term since)   Subchorionic hemorrhage, antepartum            O45.90   Gestational diabetes in pregnancy, diet        O24.410   controlled  ---------------------------------------------------------------------- Fetal Evaluation  Num Of Fetuses:         1  Fetal Heart Rate(bpm):  143  Cardiac Activity:       Observed  Presentation:           Cephalic  Placenta:               Anterior  P. Cord Insertion:      Previously Visualized  Amniotic Fluid  AFI FV:      Within normal limits  AFI Sum(cm)     %Tile       Largest Pocket(cm)  11.46           30          3.81  RUQ(cm)       RLQ(cm)       LUQ(cm)        LLQ(cm)  3.81          2.2           3.23           2.22 ---------------------------------------------------------------------- Biophysical Evaluation  Amniotic F.V:   Within normal limits       F. Tone:        Observed  F. Movement:    Observed                   Score:          8/8  F. Breathing:   Observed ---------------------------------------------------------------------- Biometry  BPD:      83.4  mm     G. Age:  33w 4d         25  %    CI:        69.96   %    70 - 86                                                          FL/HC:      21.3   %    19.4 - 21.8  HC:      318.1  mm     G. Age:  35w 6d         49  %    HC/AC:      0.96        0.96 - 1.11  AC:      332.9  mm     G. Age:  37w 1d         99  %    FL/BPD:     81.1   %    71 - 87  FL:       67.6  mm     G. Age:  34w 5d         50  %    FL/AC:      20.3   %    20 - 24  Est. FW:    2829  gm      6 lb 4 oz     87   % ---------------------------------------------------------------------- Gestational Age  LMP:           25w 3d        Date:  05/18/19                 EDD:   02/22/20  U/S Today:     35w 2d                                        EDD:   12/15/19  Best:          34w 3d     Det. By:  U/S  (06/24/19)          EDD:   12/21/19 ---------------------------------------------------------------------- Anatomy  Cranium:               Appears normal         Aortic Arch:            Previously seen  Cavum:                 Previously seen        Ductal Arch:            Previously seen  Ventricles:            Appears normal         Diaphragm:              Appears normal  Choroid Plexus:        Previously seen        Stomach:                Appears normal, left                                                                        sided  Cerebellum:            Previously seen        Abdomen:                Appears normal  Posterior Fossa:       Previously seen        Abdominal Wall:         Previously seen  Nuchal Fold:  Previously seen        Cord Vessels:           Previously seen  Face:                  Orbits previously      Kidneys:                Appear normal                         seen. Profile WNL  Lips:                  Previously seen        Bladder:                Appears normal  Thoracic:              Appears normal         Spine:                  Previously seen  Heart:                 Appears normal         Upper Extremities:      Previously seen                         (4CH, axis, and                         situs)  RVOT:                  Previously seen        Lower Extremities:      Previously seen  LVOT:                  Previously seen  Other:  Female gender previously seen. Heels/feet and open hand previously          visualized. Nasal bone previously visualized. ---------------------------------------------------------------------- Cervix Uterus Adnexa  Cervix  Not visualized (advanced GA >24wks)  ---------------------------------------------------------------------- Impression  Advanced maternal age.  Patient has gestational diabetes  start checking her blood glucose regularly.  She reports her  fasting levels are over 95 mg/dL.  Prandial levels are  reportedly within normal range.  She has not brought her  blood glucose log book today.  Blood pressure today at our  office is 129/80 mmHg.  Growth is appropriate for gestational age.  Abdominal  circumference measurement is at the 99th percentile.  Amniotic fluid is normal good fetal activity seen.  Antenatal  testing is reassuring.  BPP 8/8.  Discussed the importance of good control of diabetes to  prevent fetal and neonatal adverse outcomes.  I advised her  to contact your office with her blood glucose values so that  Metformin or insulin may be initiated if necessary. ---------------------------------------------------------------------- Recommendations  Continue weekly BPP till delivery. ----------------------------------------------------------------------                  Noralee Space, MD Electronically Signed Final Report   11/12/2019 12:08 pm ----------------------------------------------------------------------  [place] MFM OB FOLLOW UP  Result Date: 11/12/2019 ----------------------------------------------------------------------  OBSTETRICS REPORT                       (Signed Final 11/12/2019 12:08 pm) ---------------------------------------------------------------------- Patient Info  ID #:  [id number]                          D.O.B.:  09-28-1975 (43 yrs)  Name:       Yesenia Weeks                    Visit Date: 11/12/2019 11:46 am ---------------------------------------------------------------------- Performed By  Performed By:     Berlinda Last          Tertiary Phy.:    [facility]                                                             CNM  Attending:        Tama High MD        Address:          [address]                                                             [place]  Referred By:      Family Practice        [facility]         [facility]                                      Fetal Care  Ref. Address:     [place] ---------------------------------------------------------------------- Orders   #  Description                          Code         Ordered By   1  [place] MFM OB FOLLOW UP                  16010.93     RAVI SHANKAR   2  [place] MFM FETAL BPP WO NON  16109.60     RAVI [facility]      STRESS  ----------------------------------------------------------------------   #  Order #                    Accession #                 Episode #   1  [id number]                  [id number]   2  [id number]                  [id number]                  [id number]  ---------------------------------------------------------------------- Indications   Advanced maternal age multigravida 66+,        O75.523   third trimester (neg Mat 21)   [redacted] weeks gestation of pregnancy                Z3A.34   Poor obstetric history: Previous fetal growth  O09.299   restriction (FGR) per pt   Poor obstetric history (prior pre-term labor   O09.219   with 1st preg; 3 term since)   Subchorionic hemorrhage, antepartum            O45.90   Gestational diabetes in pregnancy, diet        O24.410   controlled  ---------------------------------------------------------------------- Fetal Evaluation  Num Of Fetuses:         1  Fetal Heart Rate(bpm):  143  Cardiac Activity:       Observed  Presentation:           Cephalic  Placenta:               Anterior  P. Cord Insertion:      Previously Visualized  Amniotic Fluid  AFI FV:      Within normal limits  AFI Sum(cm)     %Tile       Largest Pocket(cm)  11.46           30          3.81   RUQ(cm)       RLQ(cm)       LUQ(cm)        LLQ(cm)  3.81          2.2           3.23           2.22 ---------------------------------------------------------------------- Biophysical Evaluation  Amniotic F.V:   Within normal limits       F. Tone:        Observed  F. Movement:    Observed                   Score:          8/8  F. Breathing:   Observed ---------------------------------------------------------------------- Biometry  BPD:      83.4  mm     G. Age:  33w 4d         25  %    CI:        69.96   %    70 - 86  FL/HC:      21.3   %    19.4 - 21.8  HC:      318.1  mm     G. Age:  35w 6d         49  %    HC/AC:      0.96        0.96 - 1.11  AC:      332.9  mm     G. Age:  37w 1d         99  %    FL/BPD:     81.1   %    71 - 87  FL:       67.6  mm     G. Age:  34w 5d         50  %    FL/AC:      20.3   %    20 - 24  Est. FW:    2829  gm      6 lb 4 oz     87  % ---------------------------------------------------------------------- Gestational Age  LMP:           25w 3d        Date:  05/18/19                 EDD:   02/22/20  U/S Today:     35w 2d                                        EDD:   12/15/19  Best:          34w 3d     Det. By:  U/S  (06/24/19)          EDD:   12/21/19 ---------------------------------------------------------------------- Anatomy  Cranium:               Appears normal         Aortic Arch:            Previously seen  Cavum:                 Previously seen        Ductal Arch:            Previously seen  Ventricles:            Appears normal         Diaphragm:              Appears normal  Choroid Plexus:        Previously seen        Stomach:                Appears normal, left                                                                        sided  Cerebellum:            Previously seen        Abdomen:                Appears normal  Posterior  Fossa:       Previously seen        Abdominal Wall:         Previously seen  Nuchal Fold:            Previously seen        Cord Vessels:           Previously seen  Face:                  Orbits previously      Kidneys:                Appear normal                         seen. Profile WNL  Lips:                  Previously seen        Bladder:                Appears normal  Thoracic:              Appears normal         Spine:                  Previously seen  Heart:                 Appears normal         Upper Extremities:      Previously seen                         (4CH, axis, and                         situs)  RVOT:                  Previously seen        Lower Extremities:      Previously seen  LVOT:                  Previously seen  Other:  Female gender previously seen. Heels/feet and open hand previously          visualized. Nasal bone previously visualized. ---------------------------------------------------------------------- Cervix Uterus Adnexa  Cervix  Not visualized (advanced GA >24wks) ---------------------------------------------------------------------- Impression  Advanced maternal age.  Patient has gestational diabetes  start checking her blood glucose regularly.  She reports her  fasting levels are over 95 mg/dL.  Prandial levels are  reportedly within normal range.  She has not brought her  blood glucose log book today.  Blood pressure today at our  office is 129/80 mmHg.  Growth is appropriate for gestational age.  Abdominal  circumference measurement is at the 99th percentile.  Amniotic fluid is normal good fetal activity seen.  Antenatal  testing is reassuring.  BPP 8/8.  Discussed the importance of good control of diabetes to  prevent fetal and neonatal adverse outcomes.  I advised her  to contact your office with her blood glucose values so that  Metformin or insulin may be initiated if necessary. ---------------------------------------------------------------------- Recommendations  Continue weekly BPP till delivery.  ----------------------------------------------------------------------                  Noralee Space, MD Electronically Signed Final Report   11/12/2019 12:08 pm ----------------------------------------------------------------------   Assessment and Plan:  Pregnancy: [id number] at [redacted]w[redacted]d  1. Diet controlled gestational diabetes mellitus (GDM), antepartum A few abnormal values, will get induced next week.  IOL already scheduled at 38 weeks. Unsure of outpatient foley for now, information given to her.   2. History of IUFD Continue antenatal screening and scans per MFM, IOL scheduled at 38 weeks  3. History of prior pregnancy with SGA newborn 11/12/2019 EFW 87%  4. Genital herpes affecting pregnancy in third trimester Last outbreak was 3 weeks ago, taking suppression therapy. No lesions seen today. Counseled about risk of transmission to newborn but reiterated that the risk was lowered in the absence of visible lesions, prodromal symptoms. Patient is anxious, considering cesarean section. She will think about it and let us know what she wants to do.  5. Multigravida of advanced maternal age in third trimester 6. Supervision of high risk pregnancy, antepartum Term labor symptoms and general obstetric precautions including but not limited to vaginal bleeding, contractions, leaking of fluid and fetal movement were reviewed in detail with the patient. Please refer to After Visit Summary for other counseling recommendations.   No follow-ups on file.  Future Appointments  Date Time Provider Department Center  12/03/2019  3:30 PM [facility] NURSE [facility]  12/03/2019  3:30 PM [facility] US3 [facility]  12/07/2019 12:00 AM [facility]-LD SCHED ROOM [facility] None  12/10/2019 11:30 AM [facility] NURSE [facility]  12/10/2019 11:30 AM [facility] US3 [facility]    Jaynie Collins, MD

## 2019-11-30 NOTE — Telephone Encounter (Signed)
Preadmission screen  

## 2019-11-30 NOTE — Progress Notes (Signed)
BRx glucose readings:        

## 2019-11-30 NOTE — Patient Instructions (Addendum)
Return to office for any scheduled appointments. Call the office or go to the MAU at [place] at [facility] if:  You begin to have strong, frequent contractions  Your water breaks.  Sometimes it is a big gush of fluid, sometimes it is just a trickle that keeps getting your panties wet or running down your legs  You have vaginal bleeding.  It is normal to have a small amount of spotting if your cervix was checked.   You do not feel your baby moving like normal.  If you do not, get something to eat and drink and lay down and focus on feeling your baby move.   If your baby is still not moving like normal, you should call the office or go to MAU.  Any other obstetric concerns.   OUTPATIENT FOLEY BULB INDUCTION OF LABOR:  Information Sheet for Mothers and Family               What's a Foley Bulb Induction? A Foley bulb induction is a procedure where your provider inserts a catheter into your cervix. Once inside your womb, your provider inflates the balloon with a saline solution.   This puts pressure on your cervix and encourages dilation. The catheter falls out once your cervix dilates to 3-4 centimeters.     With any procedure, it's important that you know what to expect. The insertion of a Foley catheter can be a bit uncomfortable, and some women experience sharp pelvic pain. The pain may subside once the catheter is in place. You may experience some cramping when the Foley catheter is in place.  This is normal.     GO TO THE MATERNITY ADMISSIONS UNIT FOR THE FOLLOWING:  Heavy vaginal bleeding  Rupture of membranes (fluid that wets your underwear)  Painful uterine contractions every 5 minutes or less  Severe abdominal discomfort  Decreased movement of the baby   Labor Induction  Labor induction is when steps are taken to cause a pregnant woman to begin the labor process. Most women go into labor on their own between 37 weeks and 42 weeks of pregnancy. When this  does not happen or when there is a medical need for labor to begin, steps may be taken to induce labor. Labor induction causes a pregnant woman's uterus to contract. It also causes the cervix to soften (ripen), open (dilate), and thin out (efface). Usually, labor is not induced before 39 weeks of pregnancy unless there is a medical reason to do so. Your health care provider will determine if labor induction is needed. Before inducing labor, your health care provider will consider a number of factors, including: Your medical condition and your baby's. How many weeks along you are in your pregnancy. How mature your baby's lungs are. The condition of your cervix. The position of your baby. The size of your birth canal. What are some reasons for labor induction? Labor may be induced if: Your health or your baby's health is at risk. Your pregnancy is overdue by 1 week or more. Your water breaks but labor does not start on its own. There is a low amount of amniotic fluid around your baby. You may also choose (elect) to have labor induced at a certain time. Generally, elective labor induction is done no earlier than 39 weeks of pregnancy. What methods are used for labor induction? Methods used for labor induction include: Prostaglandin medicine. This medicine starts contractions and causes the cervix to dilate and ripen.  It can be taken by mouth (orally) or by being inserted into the vagina (suppository). Inserting a small, thin tube (catheter) with a balloon into the vagina and then expanding the balloon with water to dilate the cervix. Stripping the membranes. In this method, your health care provider gently separates amniotic sac tissue from the cervix. This causes the cervix to stretch, which in turn causes the release of a hormone called progesterone. The hormone causes the uterus to contract. This procedure is often done during an office visit, after which you will be sent home to wait for  contractions to begin. Breaking the water. In this method, your health care provider uses a small instrument to make a small hole in the amniotic sac. This eventually causes the amniotic sac to break. Contractions should begin after a few hours. Medicine to trigger or strengthen contractions. This medicine is given through an IV that is inserted into a vein in your arm. Except for membrane stripping, which can be done in a clinic, labor induction is done in the hospital so that you and your baby can be carefully monitored. How long does it take for labor to be induced? The length of time it takes to induce labor depends on how ready your body is for labor. Some inductions can take up to 2-3 days, while others may take less than a day. Induction may take longer if: You are induced early in your pregnancy. It is your first pregnancy. Your cervix is not ready. What are some risks associated with labor induction? Some risks associated with labor induction include: Changes in fetal heart rate, such as being too high, too low, or irregular (erratic). Failed induction. Infection in the mother or the baby. Increased risk of having a cesarean delivery. Fetal death. Breaking off (abruption) of the placenta from the uterus (rare). Rupture of the uterus (very rare). When induction is needed for medical reasons, the benefits of induction generally outweigh the risks. What are some reasons for not inducing labor? Labor induction should not be done if: Your baby does not tolerate contractions. You have had previous surgeries on your uterus, such as a myomectomy, removal of fibroids, or a vertical scar from a previous cesarean delivery. Your placenta lies very low in your uterus and blocks the opening of the cervix (placenta previa). Your baby is not in a head-down position. The umbilical cord drops down into the birth canal in front of the baby. There are unusual circumstances, such as the baby being very  early (premature). You have had more than 2 previous cesarean deliveries. Summary Labor induction is when steps are taken to cause a pregnant woman to begin the labor process. Labor induction causes a pregnant woman's uterus to contract. It also causes the cervix to ripen, dilate, and efface. Labor is not induced before 39 weeks of pregnancy unless there is a medical reason to do so. When induction is needed for medical reasons, the benefits of induction generally outweigh the risks. This information is not intended to replace advice given to you by your health care provider. Make sure you discuss any questions you have with your health care provider. Document Revised: 07/12/2017 Document Reviewed: 08/22/2016 Elsevier Patient Education  2020 [employer].    Cesarean Delivery Cesarean birth, or cesarean delivery, is the surgical delivery of a baby through an incision in the abdomen and the uterus. This may be referred to as a C-section. This procedure may be scheduled ahead of time, or it may be  done in an emergency situation. Tell a health care provider about:  Any allergies you have.  All medicines you are taking, including vitamins, herbs, eye drops, creams, and over-the-counter medicines.  Any problems you or family members have had with anesthetic medicines.  Any blood disorders you have.  Any surgeries you have had.  Any medical conditions you have.  Whether you or any members of your family have a history of deep vein thrombosis (DVT) or pulmonary embolism (PE). What are the risks? Generally, this is a safe procedure. However, problems may occur, including:  Infection.  Bleeding.  Allergic reactions to medicines.  Damage to other structures or organs.  Blood clots.  Injury to your baby. What happens before the procedure? General instructions  Follow instructions from your health care provider about eating or drinking restrictions.  If you know that you are  going to have a cesarean delivery, do not shave your pubic area. Shaving before the procedure may increase your risk of infection.  Plan to have someone take you home from the hospital.  Ask your health care provider what steps will be taken to prevent infection. These may include: ? Removing hair at the surgery site. ? Washing skin with a germ-killing soap. ? Taking antibiotic medicine.  Depending on the reason for your cesarean delivery, you may have a physical exam or additional testing, such as an ultrasound.  You may have your blood or urine tested. Questions for your health care provider  Ask your health care provider about: ? Changing or stopping your regular medicines. This is especially important if you are taking diabetes medicines or blood thinners. ? Your pain management plan. This is especially important if you plan to breastfeed your baby. ? How long you will be in the hospital after the procedure. ? Any concerns you may have about receiving blood products, if you need them during the procedure. ? Cord blood banking, if you plan to collect your baby's umbilical cord blood.  You may also want to ask your health care provider: ? Whether you will be able to hold or breastfeed your baby while you are still in the operating room. ? Whether your baby can stay with you immediately after the procedure and during your recovery. ? Whether a family member or a person of your choice can go with you into the operating room and stay with you during the procedure, immediately after the procedure, and during your recovery. What happens during the procedure?   An IV will be inserted into one of your veins.  Fluid and medicines, such as antibiotics, will be given before the surgery.  Fetal monitors will be placed on your abdomen to check your baby's heart rate.  You may be given a special warming gown to wear to keep your temperature stable.  A catheter may be inserted into your  bladder through your urethra. This drains your urine during the procedure.  You may be given one or more of the following: ? A medicine to numb the area (local anesthetic). ? A medicine to make you fall asleep (general anesthetic). ? A medicine (regional anesthetic) that is injected into your back or through a small thin tube placed in your back (spinal anesthetic or epidural anesthetic). This numbs everything below the injection site and allows you to stay awake during your procedure. If this makes you feel nauseous, tell your health care provider. Medicines will be available to help reduce any nausea you may feel.  An incision  will be made in your abdomen, and then in your uterus.  If you are awake during your procedure, you may feel tugging and pulling in your abdomen, but you should not feel pain. If you feel pain, tell your health care provider immediately.  Your baby will be removed from your uterus. You may feel more pressure or pushing while this happens.  Immediately after birth, your baby will be dried and kept warm. You may be able to hold and breastfeed your baby.  The umbilical cord may be clamped and cut during this time. This usually occurs after waiting a period of 1-2 minutes after delivery.  Your placenta will be removed from your uterus.  Your incisions will be closed with stitches (sutures). Staples, skin glue, or adhesive strips may also be applied to the incision in your abdomen.  Bandages (dressings) may be placed over the incision in your abdomen. The procedure may vary among health care providers and hospitals. What happens after the procedure?  Your blood pressure, heart rate, breathing rate, and blood oxygen level will be monitored until you are discharged from the hospital.  You may continue to receive fluids and medicines through an IV.  You will have some pain. Medicines will be available to help control your pain.  To help prevent blood clots: ? You may  be given medicines. ? You may have to wear compression stockings or devices. ? You will be encouraged to walk around when you are able.  Hospital staff will encourage and support bonding with your baby. Your hospital may have you and your baby to stay in the same room (rooming in) during your hospital stay to encourage successful bonding and breastfeeding.  You may be encouraged to cough and breathe deeply often. This helps to prevent lung problems.  If you have a catheter draining your urine, it will be removed as soon as possible after your procedure. Summary  Cesarean birth, or cesarean delivery, is the surgical delivery of a baby through an incision in the abdomen and the uterus.  Follow instructions from your health care provider about eating or drinking restrictions before the procedure.  You will have some pain after the procedure. Medicines will be available to help control your pain.  Hospital staff will encourage and support bonding with your baby after the procedure. Your hospital may have you and your baby to stay in the same room (rooming in) during your hospital stay to encourage successful bonding and breastfeeding. This information is not intended to replace advice given to you by your health care provider. Make sure you discuss any questions you have with your health care provider. Document Revised: 01/13/2018 Document Reviewed: 01/13/2018 Elsevier Patient Education  2020 [employer].

## 2019-12-01 ENCOUNTER — Telehealth (HOSPITAL_COMMUNITY): Payer: Self-pay | Admitting: *Deleted

## 2019-12-01 NOTE — Telephone Encounter (Signed)
Preadmission screen  

## 2019-12-02 ENCOUNTER — Telehealth (HOSPITAL_COMMUNITY): Payer: Self-pay | Admitting: *Deleted

## 2019-12-02 ENCOUNTER — Encounter (HOSPITAL_COMMUNITY): Payer: Self-pay | Admitting: *Deleted

## 2019-12-02 NOTE — Telephone Encounter (Signed)
Preadmission screen  

## 2019-12-03 ENCOUNTER — Ambulatory Visit (INDEPENDENT_AMBULATORY_CARE_PROVIDER_SITE_OTHER): Payer: Medicaid Other | Admitting: Family Medicine

## 2019-12-03 ENCOUNTER — Inpatient Hospital Stay (HOSPITAL_COMMUNITY)
Admission: AD | Admit: 2019-12-03 | Payer: Medicaid Other | Source: Home / Self Care | Admitting: Obstetrics and Gynecology

## 2019-12-03 ENCOUNTER — Inpatient Hospital Stay (HOSPITAL_COMMUNITY)
Admission: AD | Admit: 2019-12-03 | Discharge: 2019-12-07 | DRG: 787 | Disposition: A | Discharge status: Home / Self Care | Payer: Medicaid Other | Attending: Obstetrics and Gynecology | Admitting: Obstetrics and Gynecology

## 2019-12-03 ENCOUNTER — Encounter (HOSPITAL_COMMUNITY): Payer: Self-pay | Admitting: Family Medicine

## 2019-12-03 ENCOUNTER — Other Ambulatory Visit: Payer: Self-pay

## 2019-12-03 ENCOUNTER — Ambulatory Visit: Payer: Medicaid Other | Admitting: *Deleted

## 2019-12-03 ENCOUNTER — Ambulatory Visit (HOSPITAL_BASED_OUTPATIENT_CLINIC_OR_DEPARTMENT_OTHER): Payer: Medicaid Other

## 2019-12-03 DIAGNOSIS — O0993 Supervision of high risk pregnancy, unspecified, third trimester: Secondary | ICD-10-CM

## 2019-12-03 DIAGNOSIS — Z8759 Personal history of other complications of pregnancy, childbirth and the puerperium: Secondary | ICD-10-CM

## 2019-12-03 DIAGNOSIS — O0990 Supervision of high risk pregnancy, unspecified, unspecified trimester: Secondary | ICD-10-CM

## 2019-12-03 DIAGNOSIS — F411 Generalized anxiety disorder: Secondary | ICD-10-CM | POA: Diagnosis present

## 2019-12-03 DIAGNOSIS — O4202 Full-term premature rupture of membranes, onset of labor within 24 hours of rupture: Secondary | ICD-10-CM | POA: Diagnosis not present

## 2019-12-03 DIAGNOSIS — O9832 Other infections with a predominantly sexual mode of transmission complicating childbirth: Secondary | ICD-10-CM | POA: Diagnosis present

## 2019-12-03 DIAGNOSIS — Z87891 Personal history of nicotine dependence: Secondary | ICD-10-CM | POA: Diagnosis not present

## 2019-12-03 DIAGNOSIS — Z3A37 37 weeks gestation of pregnancy: Secondary | ICD-10-CM

## 2019-12-03 DIAGNOSIS — O24410 Gestational diabetes mellitus in pregnancy, diet controlled: Secondary | ICD-10-CM | POA: Diagnosis not present

## 2019-12-03 DIAGNOSIS — O09523 Supervision of elderly multigravida, third trimester: Secondary | ICD-10-CM

## 2019-12-03 DIAGNOSIS — O99344 Other mental disorders complicating childbirth: Secondary | ICD-10-CM | POA: Diagnosis present

## 2019-12-03 DIAGNOSIS — F1911 Other psychoactive substance abuse, in remission: Secondary | ICD-10-CM | POA: Diagnosis present

## 2019-12-03 DIAGNOSIS — O134 Gestational [pregnancy-induced] hypertension without significant proteinuria, complicating childbirth: Principal | ICD-10-CM | POA: Diagnosis present

## 2019-12-03 DIAGNOSIS — O24419 Gestational diabetes mellitus in pregnancy, unspecified control: Secondary | ICD-10-CM | POA: Diagnosis present

## 2019-12-03 DIAGNOSIS — O24420 Gestational diabetes mellitus in childbirth, diet controlled: Secondary | ICD-10-CM | POA: Diagnosis present

## 2019-12-03 DIAGNOSIS — Z30430 Encounter for insertion of intrauterine contraceptive device: Secondary | ICD-10-CM | POA: Diagnosis not present

## 2019-12-03 DIAGNOSIS — A6000 Herpesviral infection of urogenital system, unspecified: Secondary | ICD-10-CM | POA: Diagnosis present

## 2019-12-03 DIAGNOSIS — O9852 Other viral diseases complicating childbirth: Secondary | ICD-10-CM | POA: Diagnosis not present

## 2019-12-03 DIAGNOSIS — O09529 Supervision of elderly multigravida, unspecified trimester: Secondary | ICD-10-CM

## 2019-12-03 DIAGNOSIS — O139 Gestational [pregnancy-induced] hypertension without significant proteinuria, unspecified trimester: Secondary | ICD-10-CM | POA: Diagnosis present

## 2019-12-03 DIAGNOSIS — O98313 Other infections with a predominantly sexual mode of transmission complicating pregnancy, third trimester: Secondary | ICD-10-CM | POA: Diagnosis present

## 2019-12-03 DIAGNOSIS — F39 Unspecified mood [affective] disorder: Secondary | ICD-10-CM | POA: Diagnosis present

## 2019-12-03 DIAGNOSIS — A6009 Herpesviral infection of other urogenital tract: Secondary | ICD-10-CM | POA: Diagnosis present

## 2019-12-03 DIAGNOSIS — Z98891 History of uterine scar from previous surgery: Secondary | ICD-10-CM

## 2019-12-03 DIAGNOSIS — Z30014 Encounter for initial prescription of intrauterine contraceptive device: Secondary | ICD-10-CM | POA: Diagnosis not present

## 2019-12-03 DIAGNOSIS — B009 Herpesviral infection, unspecified: Secondary | ICD-10-CM

## 2019-12-03 DIAGNOSIS — O368130 Decreased fetal movements, third trimester, not applicable or unspecified: Secondary | ICD-10-CM

## 2019-12-03 DIAGNOSIS — Z20822 Contact with and (suspected) exposure to covid-19: Secondary | ICD-10-CM | POA: Diagnosis present

## 2019-12-03 LAB — COMPREHENSIVE METABOLIC PANEL
ALT: 15 U/L (ref 0–44)
AST: 20 U/L (ref 15–41)
Albumin: 2.9 g/dL — ABNORMAL LOW (ref 3.5–5.0)
Alkaline Phosphatase: 111 U/L (ref 38–126)
Anion gap: 12 (ref 5–15)
BUN: 10 mg/dL (ref 6–20)
CO2: 19 mmol/L — ABNORMAL LOW (ref 22–32)
Calcium: 9.4 mg/dL (ref 8.9–10.3)
Chloride: 104 mmol/L (ref 98–111)
Creatinine, Ser: 0.63 mg/dL (ref 0.44–1.00)
GFR calc Af Amer: >60 mL/min (ref >60)
GFR calc non Af Amer: >60 mL/min (ref >60)
Glucose, Bld: 71 mg/dL (ref 70–99)
Potassium: 4.1 mmol/L (ref 3.5–5.1)
Sodium: 135 mmol/L (ref 135–145)
Total Bilirubin: 0.8 mg/dL (ref 0.3–1.2)
Total Protein: 7 g/dL (ref 6.5–8.1)

## 2019-12-03 LAB — CBC
HCT: 37.5 % (ref 36.0–46.0)
Hemoglobin: 11.7 g/dL — ABNORMAL LOW (ref 12.0–15.0)
MCH: 25.8 pg — ABNORMAL LOW (ref 26.0–34.0)
MCHC: 31.2 g/dL (ref 30.0–36.0)
MCV: 82.8 fL (ref 80.0–100.0)
Platelets: 232 K/uL (ref 150–400)
RBC: 4.53 MIL/uL (ref 3.87–5.11)
RDW: 14.8 % (ref 11.5–15.5)
WBC: 8.8 K/uL (ref 4.0–10.5)
nRBC: 0 % (ref 0.0–0.2)

## 2019-12-03 LAB — TYPE AND SCREEN
ABO/RH(D): A POS
Antibody Screen: NEGATIVE

## 2019-12-03 LAB — PROTEIN / CREATININE RATIO, URINE
Creatinine, Urine: 45.56 mg/dL
Total Protein, Urine: <6 mg/dL

## 2019-12-03 LAB — POCT URINALYSIS DIP (DEVICE)
Bilirubin Urine: NEGATIVE
Glucose, UA: NEGATIVE mg/dL
Ketones, ur: NEGATIVE mg/dL
Leukocytes,Ua: NEGATIVE
Nitrite: NEGATIVE
Protein, ur: NEGATIVE mg/dL
Specific Gravity, Urine: >=1.03 (ref 1.005–1.030)
Urobilinogen, UA: 0.2 mg/dL (ref 0.0–1.0)
pH: 5.5 (ref 5.0–8.0)

## 2019-12-03 LAB — SARS CORONAVIRUS 2 BY RT PCR (HOSPITAL ORDER, PERFORMED IN ~~LOC~~ HOSPITAL LAB): SARS Coronavirus 2: NEGATIVE

## 2019-12-03 MED ORDER — CALCIUM CARBONATE ANTACID 500 MG PO CHEW: 2.0000 | CHEWABLE_TABLET | ORAL | Status: DC | PRN

## 2019-12-03 MED ORDER — FENTANYL CITRATE (PF) 100 MCG/2ML IJ SOLN: 100.0000 ug | INTRAMUSCULAR | Status: DC | PRN

## 2019-12-03 MED ORDER — TERBUTALINE SULFATE 1 MG/ML IJ SOLN: 0.2500 mg | Freq: Once | INTRAMUSCULAR | Status: DC | PRN

## 2019-12-03 MED ORDER — CEFAZOLIN SODIUM-DEXTROSE 2-4 GM/100ML-% IV SOLN
2.0000 g | INTRAVENOUS | Status: DC
  Filled 2019-12-03: qty 100

## 2019-12-03 MED ORDER — PRENATAL MULTIVITAMIN CH: 1.0000 | ORAL_TABLET | Freq: Every day | ORAL | Status: DC

## 2019-12-03 MED ORDER — ACETAMINOPHEN 325 MG PO TABS: 650.0000 mg | ORAL_TABLET | ORAL | Status: DC | PRN

## 2019-12-03 MED ORDER — LIDOCAINE HCL (PF) 1 % IJ SOLN: 30.0000 mL | INTRAMUSCULAR | Status: DC | PRN

## 2019-12-03 MED ORDER — OXYTOCIN 40 UNITS IN NORMAL SALINE INFUSION - SIMPLE MED: 2.5000 [IU]/h | INTRAVENOUS | Status: DC

## 2019-12-03 MED ORDER — ONDANSETRON HCL 4 MG/2ML IJ SOLN: 4.0000 mg | Freq: Four times a day (QID) | INTRAMUSCULAR | Status: DC | PRN

## 2019-12-03 MED ORDER — OXYTOCIN BOLUS FROM INFUSION: 500.0000 mL | Freq: Once | INTRAVENOUS | Status: DC

## 2019-12-03 MED ORDER — LACTATED RINGERS IV SOLN
INTRAVENOUS | Status: DC
  Administered 2019-12-03: 100 mL/h via INTRAVENOUS

## 2019-12-03 MED ORDER — SOD CITRATE-CITRIC ACID 500-334 MG/5ML PO SOLN
ORAL | Status: AC
  Filled 2019-12-03: qty 30

## 2019-12-03 MED ORDER — LACTATED RINGERS IV SOLN: 500.0000 mL | INTRAVENOUS | Status: DC | PRN

## 2019-12-03 MED ORDER — OXYTOCIN 40 UNITS IN NORMAL SALINE INFUSION - SIMPLE MED: 1.0000 m[IU]/min | INTRAVENOUS | Status: DC

## 2019-12-03 MED ORDER — DOCUSATE SODIUM 100 MG PO CAPS: 100.0000 mg | ORAL_CAPSULE | Freq: Every day | ORAL | Status: DC

## 2019-12-03 MED ORDER — FENTANYL CITRATE (PF) 100 MCG/2ML IJ SOLN
50.0000 ug | Freq: Once | INTRAMUSCULAR | Status: AC
  Administered 2019-12-03: 50 ug via INTRAVENOUS
  Filled 2019-12-03: qty 2

## 2019-12-03 MED ORDER — LACTATED RINGERS IV SOLN: INTRAVENOUS | Status: DC

## 2019-12-03 MED ORDER — OXYCODONE-ACETAMINOPHEN 5-325 MG PO TABS: 1.0000 | ORAL_TABLET | ORAL | Status: DC | PRN

## 2019-12-03 MED ORDER — OXYCODONE-ACETAMINOPHEN 5-325 MG PO TABS: 2.0000 | ORAL_TABLET | ORAL | Status: DC | PRN

## 2019-12-03 MED ORDER — SOD CITRATE-CITRIC ACID 500-334 MG/5ML PO SOLN: 30.0000 mL | ORAL | Status: DC | PRN

## 2019-12-03 MED ORDER — ZOLPIDEM TARTRATE 5 MG PO TABS: 5.0000 mg | ORAL_TABLET | Freq: Every evening | ORAL | Status: DC | PRN

## 2019-12-03 NOTE — Progress Notes (Signed)
    PRENATAL VISIT NOTE  Subjective:  Yesenia Weeks is a 44 y.o. [id number] at [redacted]w[redacted]d being seen today for ongoing prenatal care.  She is currently monitored for the following issues for this high-risk pregnancy and has ANEMIA, IRON DEFICIENCY, UNSPEC.; Mood disorder (HCC); Allergic rhinitis; ASTEATOTIC ECZEMA; HALLUX VALGUS, ACQUIRED; Genital herpes; Family history of breast cancer; Anxiety state; Tenosynovitis, de Quervain; Bacterial vaginosis; Cold sore; Canker sores oral; Supervision of high risk pregnancy, antepartum; Depression; AMA (advanced maternal age) multigravida 35+; History of IUFD; History of prior pregnancy with SGA newborn; History of substance abuse (HCC); Antepartum bleeding, second trimester; Gestational diabetes mellitus (GDM), antepartum; Genital herpes affecting pregnancy in third trimester; and Gestational hypertension on their problem list.  Patient reports leaking of pink tinged discharge for BPP today.  Contractions: Irritability. Vag. Bleeding: Bloody Show.  Movement: (!) Decreased. Denies leaking of fluid.   The following portions of the patient's history were reviewed and updated as appropriate: allergies, current medications, past family history, past medical history, past social history, past surgical history and problem list.   Objective:   Vitals:   12/03/19 1450  BP: 138/82  Pulse: 87  Weight: 205 lb 8 oz (93.2 kg)    Fetal Status: Fetal Heart Rate (bpm): 137   Movement: (!) Decreased  Presentation: Vertex  General:  Alert, oriented and cooperative. Patient is in no acute distress.  Skin: Skin is warm and dry. No rash noted.   Cardiovascular: Normal heart rate noted  Respiratory: Normal respiratory effort, no problems with respiration noted  Abdomen: Soft, gravid, appropriate for gestational age.  Pain/Pressure: Present     Pelvic: Cervical exam performed in the presence of a chaperone Dilation: 1 Effacement (%): Thick   no LOF on exam, no pooling, negative  fern  Extremities: Normal range of motion.  Edema: Trace  Mental Status: Normal mood and affect. Normal behavior. Normal judgment and thought content.   Assessment and Plan:  Pregnancy: [id number] at [redacted]w[redacted]d 1. Supervision of high risk pregnancy, antepartum   2. Multigravida of advanced maternal age in third trimester   3. History of IUFD With decreased FM--would move toward delivery pending BPP today  4. History of prior pregnancy with SGA newborn   5. History of substance abuse (HCC)   Term labor symptoms and general obstetric precautions including but not limited to vaginal bleeding, contractions, leaking of fluid and fetal movement were reviewed in detail with the patient. Please refer to After Visit Summary for other counseling recommendations.   No follow-ups on file.  Future Appointments  Date Time Provider Department Center  12/05/2019  9:35 AM MC-SCREENING [facility] None  12/10/2019 11:30 AM [facility] NURSE [facility]  12/10/2019 11:30 AM [facility] US3 [facility]    Reva Bores, MD

## 2019-12-03 NOTE — H&P (Addendum)
OBSTETRIC ADMISSION HISTORY AND PHYSICAL  Yesenia Weeks is a 44 y.o. female [phone] with IUP at 23w3dpresenting for BPP 2/8. She reports +FMs. No LOF, VB, blurry vision, headaches, peripheral edema, or RUQ pain. She plans on breast and bottle feeding. She requests post-placental IUD for birth control. Pt requesting CS d/t recent HSV outbreak, has been taking suppression for 8 days.   Dating: By 121w [place]--->  Estimated Date of Delivery: 12/21/19  Sono:    '@[redacted]w[redacted]d' , normal anatomy, ceph presentation, 3400g, 7+%ile, EFW 7'8   Prenatal History/Complications: - AMA - gHTN - hx HSV on suppression since - A2GDM  Past Medical History: Past Medical History:  Diagnosis Date   ADHD (attention deficit hyperactivity disorder)    Allergy    Anemia    iron def   Anxiety    stressed with preg, worry about baby   Asthma    BV (bacterial vaginosis)    recurrent-uses boric acid   Depression    Dysmenorrhea    Gestational diabetes    HSV infection    Infection    UTI   Ovarian cyst    left, states was removed   Preterm labor    Seizures ([place]    [place] age 44  [place]stance [place]     Past Surgical History: Past Surgical History:  Procedure Laterality Date   CYSTECTOMY     HERNIA REPAIR     x2   WISDOM TOOTH EXTRACTION      Obstetrical History: OB History     Gravida  5   Para  4   Term  3   Preterm  1   AB  0   Living  3      SAB  0   TAB      Ectopic      Multiple  0   Live Births  3           Social History: Social History   Socioeconomic History   Marital status: Single    Spouse name: Not on file   Number of children: 2   Years of education: 11   Highest education level: Not on file  Occupational History   Occupation: [employer]  Tobacco Use   Smoking status: Former Smoker    Types: Cigarettes    Quit date: 06/22/2019    Years since quitting: 0.4   Smokeless tobacco: Never Used  Substance and Sexual Activity   Alcohol use: Not  Currently    Comment: occasional until + upt   Drug use: Not Currently    Types: Cocaine    Comment: last used 2018   Sexual activity: Yes    Birth control/protection: None  Other Topics Concern   Not on file  Social History Narrative   Not on file   Social Determinants of Health   Financial Resource Strain:    Difficulty of Paying Living Expenses:   Food Insecurity: No Food Insecurity   Worried About RCharity fundraiserin the Last Year: Never true   Ran Out of Food in the Last Year: Never true  Transportation Needs: Unmet Transportation Needs   Lack of Transportation (Medical): Yes   Lack of Transportation (Non-Medical): Yes  Physical Activity:    Days of Exercise per Week:    Minutes of Exercise per Session:   Stress:    Feeling of Stress :   Social Connections:    Frequency of Communication with Friends and  Family:    Frequency of Social Gatherings with Friends and Family:    Attends Religious Services:    Active Member of Clubs or Organizations:    Attends Music therapist:    Marital Status:     Family History: Family History  Problem Relation Age of Onset   Hypertension Mother    Stroke Mother    Hypertension Father    COPD Father    Cancer Maternal Aunt        breast   Breast cancer Maternal Aunt    Diabetes Maternal Grandmother    Hypertension Maternal Grandmother    Mental illness Maternal Grandmother    Cancer Maternal Grandmother    Breast cancer Maternal Grandmother    Diabetes Paternal Grandmother    Kidney disease Paternal Grandmother    Mental illness Paternal Grandmother    Hypertension Paternal Grandmother    Breast cancer Maternal Aunt     Allergies: No Known Allergies  Medications Prior to Admission  Medication Sig Dispense Refill Last Dose   Accu-Chek Softclix Lancets lancets Use as instructed 4 times daily 100 each 12    acetaminophen (TYLENOL) 500 MG tablet Take 500 mg by mouth every 6 (six) hours as needed for mild  pain or headache.      albuterol (VENTOLIN HFA) 108 (90 Base) MCG/ACT inhaler INHALE 2 PUFFS INTO THE LUNGS EVERY 6 HOURS FOR UP TO 14 DAYS AS NEEDED FOR WHEEZING OR SHORTNESS OF BREATH (Patient not taking: No sig reported) 6.7 g 0    aspirin EC 81 MG tablet Take 1 tablet (81 mg total) by mouth daily. Take after 12 weeks for prevention of preeclampsia later in pregnancy (Patient not taking: Reported on 12/03/2019) 300 tablet 2    Blood Glucose Monitoring Suppl (ACCU-CHEK GUIDE) w/Device KIT 1 Device by Does not apply route as needed. 1 kit 0    Blood Pressure Monitoring (BLOOD PRESSURE KIT) DEVI 1 Device by Does not apply route as needed. 1 each 0    cetirizine (ZYRTEC) 10 MG tablet TAKE 1 TABLET(10 MG) BY MOUTH DAILY AS NEEDED 30 tablet 3    Elastic Bandages & Supports (COMFORT FIT MATERNITY SUPP LG) MISC 1 Device by Does not apply route as needed. (Patient not taking: Reported on 12/03/2019) 1 each 0    Elastic Bandages & Supports (WRIST SPLINT/COCK-UP/LEFT M) MISC 1 Device by Does not apply route as needed. 1 each 0    ferrous sulfate (FERROUSUL) 325 (65 FE) MG tablet Take 1 tablet (325 mg total) by mouth 2 (two) times daily. 60 tablet 3    fluticasone (FLONASE) 50 MCG/ACT nasal spray Place 1 spray into both nostrils daily. (Patient not taking: Reported on 12/03/2019) 16 g 3    glucose blood test strip Use as instructed 4 times daily 100 each 12    Prenatal 27-1 MG TABS Take 1 tablet by mouth daily. 90 tablet 3    valACYclovir (VALTREX) 500 MG tablet Take 1 tablet (500 mg total) by mouth 2 (two) times daily. 30 tablet 3      Review of Systems:  All systems reviewed and negative except as stated in HPI  PE: Blood pressure (!) 138/96, pulse 78, last menstrual period 05/18/2019, currently breastfeeding. General appearance: alert, cooperative and no distress Lungs: regular rate and effort Heart: regular rate  Abdomen: soft, non-tender Extremities: Homans sign is negative, no sign of  DVT Presentation: cephalic EFM: 834 bpm, mod variability, + accels, no decels Toco: irregular SVE:  1.5/50/-2 no visible HSV lesions  Prenatal labs: ABO, Rh: --/--/A POS (05/13 1755) Antibody: NEG (05/13 1755) Rubella: 3.32 (11/23 1515) RPR: Non Reactive (03/17 0911)  HBsAg: Negative (11/23 1515)  HIV: Non Reactive (03/17 0911)  GBS: Positive/-- (05/03 1128)  2 hr GTT 92/164/110  Prenatal Transfer Tool  Maternal Diabetes: Yes:  Diabetes Type:  Diet controlled Genetic Screening: Normal Maternal Ultrasounds/Referrals: Normal Fetal Ultrasounds or other Referrals:  None Maternal Substance Abuse:  No Significant Maternal Medications:  None Significant Maternal Lab Results: None  Results for orders placed or performed during the hospital encounter of 12/03/19 (from the past 24 hour(s))  SARS Coronavirus 2 by RT PCR (hospital order, performed in [place] hospital lab) Nasopharyngeal Nasopharyngeal Swab   Collection Time: 12/03/19  5:14 PM   Specimen: Nasopharyngeal Swab  Result Value Ref Range   SARS Coronavirus 2 NEGATIVE NEGATIVE  CBC   Collection Time: 12/03/19  5:30 PM  Result Value Ref Range   WBC 8.8 4.0 - 10.5 K/uL   RBC 4.53 3.87 - 5.11 MIL/uL   Hemoglobin 11.7 (L) 12.0 - 15.0 g/dL   HCT 37.5 36.0 - 46.0 %   MCV 82.8 80.0 - 100.0 fL   MCH 25.8 (L) 26.0 - 34.0 pg   MCHC 31.2 30.0 - 36.0 g/dL   RDW 14.8 11.5 - 15.5 %   Platelets 232 150 - 400 K/uL   nRBC 0.0 0.0 - 0.2 %  Comprehensive metabolic panel   Collection Time: 12/03/19  5:30 PM  Result Value Ref Range   Sodium 135 135 - 145 mmol/L   Potassium 4.1 3.5 - 5.1 mmol/L   Chloride 104 98 - 111 mmol/L   CO2 19 (L) 22 - 32 mmol/L   Glucose, Bld 71 70 - 99 mg/dL   BUN 10 6 - 20 mg/dL   Creatinine, Ser 0.63 0.44 - 1.00 mg/dL   Calcium 9.4 8.9 - 10.3 mg/dL   Total Protein 7.0 6.5 - 8.1 g/dL   Albumin 2.9 (L) 3.5 - 5.0 g/dL   AST 20 15 - 41 U/L   ALT 15 0 - 44 U/L   Alkaline Phosphatase 111 38 - 126 U/L    Total Bilirubin 0.8 0.3 - 1.2 mg/dL   GFR calc non Af Amer >60 >60 mL/min   GFR calc Af Amer >60 >60 mL/min   Anion gap 12 5 - 15  Type and screen   Collection Time: 12/03/19  5:55 PM  Result Value Ref Range   ABO/RH(D) A POS    Antibody Screen NEG    Sample Expiration      12/06/2019,2359 Performed at [facility], [address], [place], [place] [address]   Results for orders placed or performed in visit on 12/03/19 (from the past 24 hour(s))  POCT urinalysis dip (device)   Collection Time: 12/03/19  3:23 PM  Result Value Ref Range   Glucose, UA NEGATIVE NEGATIVE mg/dL   Bilirubin Urine NEGATIVE NEGATIVE   Ketones, ur NEGATIVE NEGATIVE mg/dL   Specific Gravity, Urine >=1.030 1.005 - 1.030   Hgb urine dipstick SMALL (A) NEGATIVE   pH 5.5 5.0 - 8.0   Protein, ur NEGATIVE NEGATIVE mg/dL   Urobilinogen, UA 0.2 0.0 - 1.0 mg/dL   Nitrite NEGATIVE NEGATIVE   Leukocytes,Ua NEGATIVE NEGATIVE    Patient Active Problem List   Diagnosis Date Noted   Gestational hypertension 12/03/2019   Genital herpes affecting pregnancy in third trimester 11/30/2019   Gestational diabetes  mellitus (GDM), antepartum 10/08/2019   Antepartum bleeding, second trimester 08/19/2019   Supervision of high risk pregnancy, antepartum 07/21/2019   Depression    AMA (advanced maternal age) multigravida 35+    History of IUFD    History of prior pregnancy with SGA newborn    History of substance abuse ([place])    Canker sores oral 05/25/2019   Bacterial vaginosis 06/13/2018   Cold sore 06/13/2018   Tenosynovitis, de Quervain 06/06/2017   Anxiety state 08/31/2016   Family history of breast cancer 03/31/2014   Genital herpes 10/26/2013   ASTEATOTIC ECZEMA 09/10/2008   HALLUX VALGUS, ACQUIRED 11/06/2006   ANEMIA, IRON DEFICIENCY, UNSPEC. 09/19/2006   Mood disorder ([place]) 09/19/2006   Allergic rhinitis 09/19/2006   Assessment: [redacted] weeks gestation Reactive NST Hx HSV w/recent outbreak BPP 2/8 gHTN-  stable  Plan: Admit to Ante CS scheduled for tomorrow Mngt per Dr. Jeannie Fend, CNM  12/03/2019, 6:54 PM   OB Attending Discussed with pt mode of delivery in light of secondary HSV outbreak approx 3 weeks ago. Pt has been on treatment for 8 days No active lesions on exam today NST CAT 1 Pt eat meal at @ 2 PM C section vs IOL reviewed with pt, R/B/post care reviewed. Pt desires c section. Due to Cat 1 NST will admit to [facility], continuous monitoring, NPO after midnight and c section @ 9 AM.  Arlina Robes, MD

## 2019-12-03 NOTE — Progress Notes (Signed)
     Fleet Contras RN 12/03/19

## 2019-12-03 NOTE — Progress Notes (Signed)
Per Dr. Judeth Cornfield, pt to MAU for evaluation of failed BPP 2/8.  Good fetal movement. Pt reported she has been having some pinkish watery D/C at [facility] prior to appt today & had VE.  Report called to [place].

## 2019-12-04 ENCOUNTER — Inpatient Hospital Stay (HOSPITAL_COMMUNITY): Payer: Medicaid Other | Admitting: Anesthesiology

## 2019-12-04 ENCOUNTER — Encounter (HOSPITAL_COMMUNITY)
Admission: AD | Disposition: A | Discharge status: Home / Self Care | Payer: Self-pay | Source: Home / Self Care | Attending: Obstetrics and Gynecology

## 2019-12-04 ENCOUNTER — Encounter (HOSPITAL_COMMUNITY): Payer: Self-pay | Admitting: Obstetrics and Gynecology

## 2019-12-04 DIAGNOSIS — O9852 Other viral diseases complicating childbirth: Secondary | ICD-10-CM

## 2019-12-04 DIAGNOSIS — B009 Herpesviral infection, unspecified: Secondary | ICD-10-CM

## 2019-12-04 DIAGNOSIS — O4202 Full-term premature rupture of membranes, onset of labor within 24 hours of rupture: Secondary | ICD-10-CM

## 2019-12-04 DIAGNOSIS — Z3A37 37 weeks gestation of pregnancy: Secondary | ICD-10-CM

## 2019-12-04 DIAGNOSIS — O24420 Gestational diabetes mellitus in childbirth, diet controlled: Secondary | ICD-10-CM

## 2019-12-04 DIAGNOSIS — Z30014 Encounter for initial prescription of intrauterine contraceptive device: Secondary | ICD-10-CM

## 2019-12-04 LAB — RPR: RPR Ser Ql: NONREACTIVE

## 2019-12-04 LAB — GLUCOSE, CAPILLARY: Glucose-Capillary: 86 mg/dL (ref 70–99)

## 2019-12-04 SURGERY — Surgical Case
Anesthesia: Spinal | Wound class: Clean Contaminated

## 2019-12-04 MED ORDER — DIBUCAINE (PERIANAL) 1 % EX OINT: 1.0000 "application " | TOPICAL_OINTMENT | CUTANEOUS | Status: DC | PRN

## 2019-12-04 MED ORDER — ENOXAPARIN SODIUM 40 MG/0.4ML ~~LOC~~ SOLN
40.0000 mg | SUBCUTANEOUS | Status: DC
  Administered 2019-12-05 – 2019-12-07 (×3): 40 mg via SUBCUTANEOUS
  Filled 2019-12-04 (×3): qty 0.4

## 2019-12-04 MED ORDER — NALBUPHINE HCL 10 MG/ML IJ SOLN: 5.0000 mg | Freq: Once | INTRAMUSCULAR | Status: AC | PRN

## 2019-12-04 MED ORDER — SODIUM CHLORIDE 0.9 % IV SOLN: INTRAVENOUS | Status: DC | PRN

## 2019-12-04 MED ORDER — SOD CITRATE-CITRIC ACID 500-334 MG/5ML PO SOLN
30.0000 mL | Freq: Once | ORAL | Status: AC
  Administered 2019-12-04: 30 mL via ORAL

## 2019-12-04 MED ORDER — SODIUM CHLORIDE 0.9 % IR SOLN
Status: DC | PRN
  Administered 2019-12-04: 1

## 2019-12-04 MED ORDER — NALOXONE HCL 4 MG/10ML IJ SOLN
1.0000 ug/kg/h | INTRAVENOUS | Status: DC | PRN
  Filled 2019-12-04: qty 5

## 2019-12-04 MED ORDER — NALBUPHINE HCL 10 MG/ML IJ SOLN
5.0000 mg | INTRAMUSCULAR | Status: DC | PRN
  Administered 2019-12-04: 5 mg via SUBCUTANEOUS
  Filled 2019-12-04: qty 1

## 2019-12-04 MED ORDER — ACETAMINOPHEN 325 MG PO TABS
650.0000 mg | ORAL_TABLET | Freq: Four times a day (QID) | ORAL | Status: DC | PRN
  Administered 2019-12-04 – 2019-12-07 (×10): 650 mg via ORAL
  Filled 2019-12-04 (×11): qty 2

## 2019-12-04 MED ORDER — CEFAZOLIN SODIUM-DEXTROSE 2-3 GM-%(50ML) IV SOLR
INTRAVENOUS | Status: DC | PRN
Start: 2019-12-04 — End: 2019-12-04
  Administered 2019-12-04: 2 g via INTRAVENOUS

## 2019-12-04 MED ORDER — FENTANYL CITRATE (PF) 100 MCG/2ML IJ SOLN
INTRAMUSCULAR | Status: DC | PRN
  Administered 2019-12-04: 25 ug via INTRATHECAL
  Administered 2019-12-04: 15 ug via INTRATHECAL

## 2019-12-04 MED ORDER — KETOROLAC TROMETHAMINE 30 MG/ML IJ SOLN
30.0000 mg | Freq: Four times a day (QID) | INTRAMUSCULAR | Status: AC
  Administered 2019-12-04 (×2): 30 mg via INTRAVENOUS
  Filled 2019-12-04 (×2): qty 1

## 2019-12-04 MED ORDER — OXYCODONE HCL 5 MG/5ML PO SOLN: 5.0000 mg | Freq: Once | ORAL | Status: DC | PRN

## 2019-12-04 MED ORDER — LACTATED RINGERS IV SOLN: INTRAVENOUS | Status: DC | PRN

## 2019-12-04 MED ORDER — NALBUPHINE HCL 10 MG/ML IJ SOLN
INTRAMUSCULAR | Status: AC
  Filled 2019-12-04: qty 1

## 2019-12-04 MED ORDER — LACTATED RINGERS IV SOLN: INTRAVENOUS | Status: DC

## 2019-12-04 MED ORDER — PRENATAL MULTIVITAMIN CH
1.0000 | ORAL_TABLET | Freq: Every day | ORAL | Status: DC
  Administered 2019-12-05 – 2019-12-07 (×3): 1 via ORAL
  Filled 2019-12-04 (×3): qty 1

## 2019-12-04 MED ORDER — WITCH HAZEL-GLYCERIN EX PADS: 1.0000 "application " | MEDICATED_PAD | CUTANEOUS | Status: DC | PRN

## 2019-12-04 MED ORDER — LEVONORGESTREL 19.5 MCG/DAY IU IUD
INTRAUTERINE_SYSTEM | Freq: Once | INTRAUTERINE | Status: AC
  Administered 2019-12-04: 1 via INTRAUTERINE
  Filled 2019-12-04: qty 1

## 2019-12-04 MED ORDER — ZOLPIDEM TARTRATE 5 MG PO TABS: 5.0000 mg | ORAL_TABLET | Freq: Every evening | ORAL | Status: DC | PRN

## 2019-12-04 MED ORDER — SOD CITRATE-CITRIC ACID 500-334 MG/5ML PO SOLN
ORAL | Status: AC
  Filled 2019-12-04: qty 15

## 2019-12-04 MED ORDER — ONDANSETRON HCL 4 MG/2ML IJ SOLN: 4.0000 mg | Freq: Three times a day (TID) | INTRAMUSCULAR | Status: DC | PRN

## 2019-12-04 MED ORDER — OXYTOCIN 40 UNITS IN NORMAL SALINE INFUSION - SIMPLE MED
INTRAVENOUS | Status: DC | PRN
  Administered 2019-12-04: 40 [IU] via INTRAVENOUS

## 2019-12-04 MED ORDER — NALOXONE HCL 0.4 MG/ML IJ SOLN: 0.4000 mg | INTRAMUSCULAR | Status: DC | PRN

## 2019-12-04 MED ORDER — FENTANYL CITRATE (PF) 100 MCG/2ML IJ SOLN: 25.0000 ug | INTRAMUSCULAR | Status: DC | PRN

## 2019-12-04 MED ORDER — NALBUPHINE HCL 10 MG/ML IJ SOLN: 5.0000 mg | INTRAMUSCULAR | Status: DC | PRN

## 2019-12-04 MED ORDER — IBUPROFEN 800 MG PO TABS
800.0000 mg | ORAL_TABLET | Freq: Four times a day (QID) | ORAL | Status: DC
  Administered 2019-12-05 – 2019-12-07 (×11): 800 mg via ORAL
  Filled 2019-12-04 (×11): qty 1

## 2019-12-04 MED ORDER — MORPHINE SULFATE (PF) 0.5 MG/ML IJ SOLN
INTRAMUSCULAR | Status: DC | PRN
  Administered 2019-12-04: .15 mg via INTRATHECAL

## 2019-12-04 MED ORDER — OXYCODONE HCL 5 MG PO TABS
5.0000 mg | ORAL_TABLET | ORAL | Status: DC | PRN
  Administered 2019-12-05: 10 mg via ORAL
  Administered 2019-12-05: 5 mg via ORAL
  Administered 2019-12-06 (×2): 10 mg via ORAL
  Administered 2019-12-06: 5 mg via ORAL
  Administered 2019-12-07: 10 mg via ORAL
  Filled 2019-12-04: qty 2
  Filled 2019-12-04: qty 1
  Filled 2019-12-04 (×3): qty 2
  Filled 2019-12-04: qty 1

## 2019-12-04 MED ORDER — MENTHOL 3 MG MT LOZG: 1.0000 | LOZENGE | OROMUCOSAL | Status: DC | PRN

## 2019-12-04 MED ORDER — ONDANSETRON HCL 4 MG/2ML IJ SOLN
INTRAMUSCULAR | Status: DC | PRN
  Administered 2019-12-04: 4 mg via INTRAVENOUS

## 2019-12-04 MED ORDER — SIMETHICONE 80 MG PO CHEW
80.0000 mg | CHEWABLE_TABLET | ORAL | Status: DC | PRN
  Administered 2019-12-05: 80 mg via ORAL

## 2019-12-04 MED ORDER — SENNOSIDES-DOCUSATE SODIUM 8.6-50 MG PO TABS
2.0000 | ORAL_TABLET | ORAL | Status: DC
  Administered 2019-12-05 – 2019-12-06 (×3): 2 via ORAL
  Filled 2019-12-04 (×3): qty 2

## 2019-12-04 MED ORDER — SIMETHICONE 80 MG PO CHEW
80.0000 mg | CHEWABLE_TABLET | ORAL | Status: DC
  Administered 2019-12-06 (×2): 80 mg via ORAL
  Filled 2019-12-04 (×3): qty 1

## 2019-12-04 MED ORDER — LEVONORGESTREL 19.5 MCG/DAY IU IUD
INTRAUTERINE_SYSTEM | INTRAUTERINE | Status: AC
  Filled 2019-12-04: qty 1

## 2019-12-04 MED ORDER — FENTANYL CITRATE (PF) 100 MCG/2ML IJ SOLN
50.0000 ug | Freq: Once | INTRAMUSCULAR | Status: AC
  Administered 2019-12-04: 50 ug via INTRAVENOUS
  Filled 2019-12-04: qty 2

## 2019-12-04 MED ORDER — DIPHENHYDRAMINE HCL 50 MG/ML IJ SOLN: 12.5000 mg | INTRAMUSCULAR | Status: DC | PRN

## 2019-12-04 MED ORDER — ONDANSETRON HCL 4 MG/2ML IJ SOLN: 4.0000 mg | Freq: Once | INTRAMUSCULAR | Status: DC | PRN

## 2019-12-04 MED ORDER — TETANUS-DIPHTH-ACELL PERTUSSIS 5-2.5-18.5 LF-MCG/0.5 IM SUSP: 0.5000 mL | Freq: Once | INTRAMUSCULAR | Status: DC

## 2019-12-04 MED ORDER — SCOPOLAMINE 1 MG/3DAYS TD PT72
1.0000 | MEDICATED_PATCH | Freq: Once | TRANSDERMAL | Status: AC
  Administered 2019-12-04: 1.5 mg via TRANSDERMAL

## 2019-12-04 MED ORDER — PHENYLEPHRINE HCL-NACL 20-0.9 MG/250ML-% IV SOLN
INTRAVENOUS | Status: DC | PRN
  Administered 2019-12-04: 60 ug/min via INTRAVENOUS

## 2019-12-04 MED ORDER — DIPHENHYDRAMINE HCL 25 MG PO CAPS: 25.0000 mg | ORAL_CAPSULE | Freq: Four times a day (QID) | ORAL | Status: DC | PRN

## 2019-12-04 MED ORDER — OXYTOCIN 40 UNITS IN NORMAL SALINE INFUSION - SIMPLE MED: 2.5000 [IU]/h | INTRAVENOUS | Status: AC

## 2019-12-04 MED ORDER — SIMETHICONE 80 MG PO CHEW
80.0000 mg | CHEWABLE_TABLET | Freq: Three times a day (TID) | ORAL | Status: DC
  Administered 2019-12-04 – 2019-12-07 (×9): 80 mg via ORAL
  Filled 2019-12-04 (×10): qty 1

## 2019-12-04 MED ORDER — KETOROLAC TROMETHAMINE 30 MG/ML IJ SOLN
INTRAMUSCULAR | Status: DC | PRN
Start: 2019-12-04 — End: 2019-12-04
  Administered 2019-12-04: 30 mg via INTRAVENOUS

## 2019-12-04 MED ORDER — KETOROLAC TROMETHAMINE 30 MG/ML IJ SOLN: 30.0000 mg | Freq: Once | INTRAMUSCULAR | Status: DC | PRN

## 2019-12-04 MED ORDER — COCONUT OIL OIL
1.0000 "application " | TOPICAL_OIL | Status: DC | PRN
  Administered 2019-12-04: 1 via TOPICAL

## 2019-12-04 MED ORDER — DIPHENHYDRAMINE HCL 25 MG PO CAPS
25.0000 mg | ORAL_CAPSULE | ORAL | Status: DC | PRN
  Filled 2019-12-04: qty 1

## 2019-12-04 MED ORDER — DEXAMETHASONE SODIUM PHOSPHATE 10 MG/ML IJ SOLN
INTRAMUSCULAR | Status: DC | PRN
Start: 2019-12-04 — End: 2019-12-04
  Administered 2019-12-04: 10 mg via INTRAVENOUS

## 2019-12-04 MED ORDER — KETOROLAC TROMETHAMINE 30 MG/ML IJ SOLN
INTRAMUSCULAR | Status: AC
  Filled 2019-12-04: qty 1

## 2019-12-04 MED ORDER — BUPIVACAINE IN DEXTROSE 0.75-8.25 % IT SOLN
INTRATHECAL | Status: DC | PRN
  Administered 2019-12-04: 1.6 mL via INTRATHECAL

## 2019-12-04 MED ORDER — MEPERIDINE HCL 25 MG/ML IJ SOLN: 6.2500 mg | INTRAMUSCULAR | Status: DC | PRN

## 2019-12-04 MED ORDER — NALBUPHINE HCL 10 MG/ML IJ SOLN
5.0000 mg | Freq: Once | INTRAMUSCULAR | Status: AC | PRN
  Administered 2019-12-04: 5 mg via INTRAVENOUS

## 2019-12-04 MED ORDER — SODIUM CHLORIDE 0.9% FLUSH: 3.0000 mL | INTRAVENOUS | Status: DC | PRN

## 2019-12-04 MED ORDER — STERILE WATER FOR IRRIGATION IR SOLN
Status: DC | PRN
  Administered 2019-12-04: 1000 mL

## 2019-12-04 MED ORDER — OXYCODONE HCL 5 MG PO TABS: 5.0000 mg | ORAL_TABLET | Freq: Once | ORAL | Status: DC | PRN

## 2019-12-04 MED ORDER — SCOPOLAMINE 1 MG/3DAYS TD PT72
MEDICATED_PATCH | TRANSDERMAL | Status: AC
  Filled 2019-12-04: qty 1

## 2019-12-04 SURGICAL SUPPLY — 41 items
APL SKNCLS STERI-STRIP NONHPOA (GAUZE/BANDAGES/DRESSINGS) ×1
BENZOIN TINCTURE PRP APPL 2/3 (GAUZE/BANDAGES/DRESSINGS) ×3 IMPLANT
CHLORAPREP W/TINT 26ML (MISCELLANEOUS) ×3 IMPLANT
CLAMP CORD UMBIL (MISCELLANEOUS) IMPLANT
CLOSURE WOUND 1/2 X4 (GAUZE/BANDAGES/DRESSINGS) ×1
CLOTH BEACON ORANGE TIMEOUT ST (SAFETY) ×3 IMPLANT
DRAPE C SECTION CLR SCREEN (DRAPES) IMPLANT
DRSG OPSITE POSTOP 4X10 (GAUZE/BANDAGES/DRESSINGS) ×3 IMPLANT
ELECT REM PT RETURN 9FT ADLT (ELECTROSURGICAL) ×3
ELECTRODE REM PT RTRN 9FT ADLT (ELECTROSURGICAL) ×1 IMPLANT
EXTRACTOR VACUUM M CUP 4 TUBE (SUCTIONS) IMPLANT
EXTRACTOR VACUUM M CUP 4' TUBE (SUCTIONS)
GLOVE BIO SURGEON STRL SZ7.5 (GLOVE) ×3 IMPLANT
GLOVE BIOGEL PI IND STRL 7.0 (GLOVE) ×1 IMPLANT
GLOVE BIOGEL PI INDICATOR 7.0 (GLOVE) ×2
GOWN STRL REUS W/TWL 2XL LVL3 (GOWN DISPOSABLE) ×3 IMPLANT
GOWN STRL REUS W/TWL LRG LVL3 (GOWN DISPOSABLE) ×6 IMPLANT
KIT ABG SYR 3ML LUER SLIP (SYRINGE) IMPLANT
NDL HYPO 25X5/8 SAFETYGLIDE (NEEDLE) IMPLANT
NEEDLE HYPO 22GX1.5 SAFETY (NEEDLE) ×3 IMPLANT
NEEDLE HYPO 25X5/8 SAFETYGLIDE (NEEDLE) IMPLANT
NS IRRIG 1000ML POUR BTL (IV SOLUTION) ×3 IMPLANT
PACK C SECTION WH (CUSTOM PROCEDURE TRAY) ×3 IMPLANT
PAD OB MATERNITY 4.3X12.25 (PERSONAL CARE ITEMS) ×3 IMPLANT
PENCIL SMOKE EVAC W/HOLSTER (ELECTROSURGICAL) ×3 IMPLANT
RTRCTR C-SECT PINK 25CM LRG (MISCELLANEOUS) ×3 IMPLANT
STRIP CLOSURE SKIN 1/2X4 (GAUZE/BANDAGES/DRESSINGS) ×2 IMPLANT
SUT CHROMIC 1 CTX 36 (SUTURE) ×6 IMPLANT
SUT VIC AB 1 CT1 36 (SUTURE) ×6 IMPLANT
SUT VIC AB 2-0 CT1 (SUTURE) ×3 IMPLANT
SUT VIC AB 2-0 CT1 27 (SUTURE) ×3
SUT VIC AB 2-0 CT1 TAPERPNT 27 (SUTURE) ×1 IMPLANT
SUT VIC AB 3-0 CT1 27 (SUTURE) ×6
SUT VIC AB 3-0 CT1 TAPERPNT 27 (SUTURE) ×2 IMPLANT
SUT VIC AB 3-0 SH 27 (SUTURE)
SUT VIC AB 3-0 SH 27X BRD (SUTURE) IMPLANT
SUT VIC AB 4-0 KS 27 (SUTURE) ×3 IMPLANT
SYR BULB IRRIGATION 50ML (SYRINGE) IMPLANT
TOWEL OR 17X24 6PK STRL BLUE (TOWEL DISPOSABLE) ×3 IMPLANT
TRAY FOLEY W/BAG SLVR 14FR LF (SET/KITS/TRAYS/PACK) ×3 IMPLANT
WATER STERILE IRR 1000ML POUR (IV SOLUTION) ×3 IMPLANT

## 2019-12-04 NOTE — Transfer of Care (Signed)
Immediate Anesthesia Transfer of Care Note  Patient: Yesenia Weeks  Procedure(s) Performed: CESAREAN SECTION (N/A )  Patient Location: PACU  Anesthesia Type:Spinal  Level of Consciousness: awake, alert  and oriented  Airway & Oxygen Therapy: Patient Spontanous Breathing  Post-op Assessment: Report given to RN and Post -op Vital signs reviewed and stable  Post vital signs: Reviewed and stable  Last Vitals:  Vitals Value Taken Time  BP    Temp    Pulse 87 12/04/19 0806  Resp 20 12/04/19 0806  SpO2 98 % 12/04/19 0806  Vitals shown include unvalidated device data.  Last Pain:  Vitals:   12/04/19 0609  TempSrc: Oral  PainSc:       Patients Stated Pain Goal: 3 (12/04/19 3010)  Complications: No apparent anesthesia complications

## 2019-12-04 NOTE — Anesthesia Postprocedure Evaluation (Signed)
Anesthesia Post Note  Patient: Yesenia Weeks  Procedure(s) Performed: CESAREAN SECTION (N/A )     Patient location during evaluation: PACU Anesthesia Type: Spinal Level of consciousness: oriented and awake and alert Pain management: pain level controlled Vital Signs Assessment: post-procedure vital signs reviewed and stable Respiratory status: spontaneous breathing, respiratory function stable and nonlabored ventilation Cardiovascular status: blood pressure returned to baseline and stable Postop Assessment: no headache, no backache, no apparent nausea or vomiting and spinal receding Anesthetic complications: no    Last Vitals:  Vitals:   12/04/19 1150 12/04/19 1300  BP: 117/73 127/69  Pulse:    Resp: 18 18  Temp: 36.7 C 36.7 C  SpO2: 99% 100%    Last Pain:  Vitals:   12/04/19 1300  TempSrc: Oral  PainSc:    Pain Goal: Patients Stated Pain Goal: 3 (12/04/19 1030)                 Lucretia Kern

## 2019-12-04 NOTE — Anesthesia Preprocedure Evaluation (Addendum)
Anesthesia Evaluation  Patient identified by MRN, date of birth, ID band Patient awake    Reviewed: Allergy & Precautions, NPO status , Patient's Chart, lab work & pertinent test results  Airway Mallampati: II  TM Distance: >3 FB Neck ROM: Full    Dental   Pulmonary asthma , former smoker,    Pulmonary exam normal        Cardiovascular hypertension (gestational), Normal cardiovascular exam     Neuro/Psych Seizures - (childhood),  PSYCHIATRIC DISORDERS Anxiety    GI/Hepatic negative GI ROS, Neg liver ROS,   Endo/Other  diabetes, Gestational  Renal/GU negative Renal ROS  negative genitourinary   Musculoskeletal negative musculoskeletal ROS (+)   Abdominal   Peds negative pediatric ROS (+)  Hematology  (+) anemia ,   Anesthesia Other Findings   Reproductive/Obstetrics (+) Pregnancy                            Anesthesia Physical Anesthesia Plan  ASA: III  Anesthesia Plan: Spinal   Post-op Pain Management:    Induction:   PONV Risk Score and Plan: 3 and Ondansetron and Treatment may vary due to age or medical condition  Airway Management Planned: Natural Airway  Additional Equipment: None  Intra-op Plan:   Post-operative Plan:   Informed Consent: I have reviewed the patients History and Physical, chart, labs and discussed the procedure including the risks, benefits and alternatives for the proposed anesthesia with the patient or authorized representative who has indicated his/her understanding and acceptance.       Plan Discussed with:   Anesthesia Plan Comments:        Anesthesia Quick Evaluation

## 2019-12-04 NOTE — Anesthesia Procedure Notes (Signed)
Spinal  Patient location during procedure: OR Staffing Performed: anesthesiologist  Anesthesiologist: Brevyn Ring E, MD Preanesthetic Checklist Completed: patient identified, IV checked, risks and benefits discussed, surgical consent, monitors and equipment checked, pre-op evaluation and timeout performed Spinal Block Patient position: sitting Prep: DuraPrep and site prepped and draped Patient monitoring: continuous pulse ox, blood pressure and heart rate Approach: midline Location: L3-4 Injection technique: single-shot Needle Needle type: Pencan  Needle gauge: 24 G Needle length: 9 cm Additional Notes Functioning IV was confirmed and monitors were applied. Sterile prep and drape, including hand hygiene and sterile gloves were used. The patient was positioned and the spine was prepped. The skin was anesthetized with lidocaine.  Free flow of clear CSF was obtained prior to injecting local anesthetic into the CSF. The needle was carefully withdrawn. The patient tolerated the procedure well.      

## 2019-12-04 NOTE — Progress Notes (Signed)
Patient ID: Yesenia Weeks, female   DOB: 08-02-1975, 44 y.o.   MRN: [id number] CTSP for possible SROM.  SROM confrimed. Pt orginally set for c section this AM for recent secondary outbreak of HSV. See H & P for addition information. D/T SROM will proceed to c section now. SVE 2/50/-3, grossly ruptured  The risks of cesarean section discussed with the patient included but were not limited to: bleeding which may require transfusion or reoperation; infection which may require antibiotics; injury to bowel, bladder, ureters or other surrounding organs; injury to the fetus; need for additional procedures including hysterectomy in the event of a life-threatening hemorrhage; placental abnormalities wth subsequent pregnancies, incisional problems, thromboembolic phenomenon and other postoperative/anesthesia complications. The patient concurred with the proposed plan, giving informed written consent for the procedure.   Anesthesia and OR aware. Preoperative prophylactic antibiotics and SCDs ordered on call to the OR.  To OR when ready.  Pt also desires post c section IUD. IUD reviewed with pt. Pt desires to proceed with IUD post  c section.  [name] L. Alysia Penna, MD

## 2019-12-04 NOTE — Discharge Summary (Signed)
Postpartum Discharge Summary    Patient Name: Yesenia Weeks DOB: 03/15/1976 MRN: [id number]  Date of admission: 12/03/2019 Delivery date:12/04/2019  Delivering provider: Chancy Milroy  Date of discharge: 12/07/2019  Admitting diagnosis: Gestational hypertension [O13.9] Intrauterine pregnancy: [redacted]w[redacted]d    Secondary diagnosis:  Active Problems:   [place] disorder [place]   Genital herpes   AMA (advanced maternal age) multigravida 35+   History of IUFD   History of prior pregnancy with SGA newborn   History of substance abuse ([place]   Gestational diabetes mellitus (GDM), antepartum   Genital herpes affecting pregnancy in third trimester   Gestational hypertension   Maternal active herpes simplex virus (HSV), delivered, current hospitalization  Additional problems: None    Discharge diagnosis: Term Pregnancy Delivered                                              Post partum procedures:None Augmentation: N/A Complications: None  Hospital course: Sceduled C/S   44y.o. yo [id number] admitted to the hospital 12/03/2019 for scheduled cesarean section with the following indication:BPP 4/8, SOL/SROM, HSV outbreak. Delivery details are as follows:  Membrane Rupture Time/Date: 6:15 AM ,12/04/2019   Delivery Method:C-Section, Low Transverse  Details of operation can be found in separate operative note. IUD inserted after c-section. Patient had an uncomplicated postpartum course.  She is ambulating, tolerating a regular diet, passing flatus, and urinating well. Patient is discharged home in stable condition on  12/07/19        Newborn Data: Birth date:12/04/2019  Birth time:7:28 AM  Gender:Female  Living status:Living  Apgars:9 ,9     Magnesium Sulfate received: No BMZ received: No Rhophylac:No MMR:No T-DaP:Given prenatally Flu: Prenatally  Transfusion:No  Physical exam  Vitals:   12/06/19 2112 12/06/19 2128 12/07/19 0551 12/07/19 0641  BP: (!) 144/75 136/70 138/89 134/86   Pulse: 93 87 77   Resp: 17  16   Temp: 98.6 F (37 C)  98 F (36.7 C)   TempSrc: Oral  Oral   SpO2: 99%     Weight:      Height:       General: alert, cooperative and no distress Lochia: appropriate Uterine Fundus: firm Incision: Dressing is clean, dry, and intact DVT Evaluation: No evidence of DVT seen on physical exam. Labs: Lab Results  Component Value Date   WBC 8.8 12/03/2019   HGB 11.7 (L) 12/03/2019   HCT 37.5 12/03/2019   MCV 82.8 12/03/2019   PLT 232 12/03/2019   CMP Latest Ref Rng & Units 12/03/2019  Glucose 70 - 99 mg/dL 71  BUN 6 - 20 mg/dL 10  Creatinine 0.44 - 1.00 mg/dL 0.63  Sodium 135 - 145 mmol/L 135  Potassium 3.5 - 5.1 mmol/L 4.1  Chloride 98 - 111 mmol/L 104  CO2 22 - 32 mmol/L 19(L)  Calcium 8.9 - 10.3 mg/dL 9.4  Total Protein 6.5 - 8.1 g/dL 7.0  Total Bilirubin 0.3 - 1.2 mg/dL 0.8  Alkaline Phos 38 - 126 U/L 111  AST 15 - 41 U/L 20  ALT 0 - 44 U/L 15   Edinburgh Score: Edinburgh Postnatal Depression Scale Screening Tool 02/25/2017  I have been able to laugh and see the funny side of things. 1  I have looked forward with enjoyment to things. 2  I have blamed myself unnecessarily  when things went wrong. 1  I have been anxious or worried for no good reason. 3  I have felt scared or panicky for no good reason. 2  Things have been getting on top of me. 2  I have been so unhappy that I have had difficulty sleeping. 3  I have felt sad or miserable. 3  I have been so unhappy that I have been crying. 1  The thought of harming myself has occurred to me. 0  Edinburgh Postnatal Depression Scale Total 18     After visit meds:  Allergies as of 12/07/2019   No Known Allergies     Medication List    STOP taking these medications   Accu-Chek Guide w/Device Kit   Accu-Chek Softclix Lancets lancets   aspirin EC 81 MG tablet   Blood Pressure Kit Devi   Comfort Fit Maternity Supp Lg Misc   fluticasone 50 MCG/ACT nasal spray Commonly known as:  Flonase   glucose blood test strip   Wrist Splint/Cock-Up/Left M Misc     TAKE these medications   acetaminophen 325 MG tablet Commonly known as: TYLENOL Take 2 tablets (650 mg total) by mouth every 4 (four) hours as needed for mild pain (temperature > 101.5.). What changed:   medication strength  how much to take  when to take this  reasons to take this   albuterol 108 (90 Base) MCG/ACT inhaler Commonly known as: VENTOLIN HFA INHALE 2 PUFFS INTO THE LUNGS EVERY 6 HOURS FOR UP TO 14 DAYS AS NEEDED FOR WHEEZING OR SHORTNESS OF BREATH   cetirizine 10 MG tablet Commonly known as: ZYRTEC TAKE 1 TABLET(10 MG) BY MOUTH DAILY AS NEEDED What changed: See the new instructions.   ferrous sulfate 325 (65 FE) MG tablet Commonly known as: FerrouSul Take 1 tablet (325 mg total) by mouth 2 (two) times daily.   ibuprofen 800 MG tablet Commonly known as: ADVIL Take 1 tablet (800 mg total) by mouth every 6 (six) hours.   oxyCODONE 5 MG immediate release tablet Commonly known as: Oxy IR/ROXICODONE Take 1 tablet (5 mg total) by mouth every 4 (four) hours as needed for up to 3 days for moderate pain.   Prenatal 27-1 MG Tabs Take 1 tablet by mouth daily.   sertraline 25 MG tablet Commonly known as: ZOLOFT Take 1 tablet (25 mg total) by mouth daily. Start taking on: Dec 08, 2019   simethicone 80 MG chewable tablet Commonly known as: MYLICON Chew 1 tablet (80 mg total) by mouth 3 (three) times daily after meals.   valACYclovir 500 MG tablet Commonly known as: Valtrex Take 1 tablet (500 mg total) by mouth 2 (two) times daily.        Discharge home in stable condition Infant Feeding: Bottle and Breast Infant Disposition:home with mother Discharge instruction: per After Visit Summary and Postpartum booklet. Activity: Advance as tolerated. Pelvic rest for 6 weeks.  Diet: routine diet Future Appointments: Future Appointments  Date Time Provider [place]  12/18/2019  10:00 AM [facility] NURSE [facility]  01/04/2020  8:55 AM Lajean Manes, CNM [facility]   Follow up Visit: Please schedule this patient for a In person postpartum visit in 4 weeks with the following provider: Any provider. Additional Postpartum F/U:Incision check 1 week  Low risk pregnancy complicated by: HSV Delivery mode:  C-Section, Low Transverse  Anticipated Birth Control:  IUD placed  --Patient was scheduled for virtual appointment. Message sent to clinic to change to in-person so  IUD strings can be checked  --Patient endorses feeling better after initiating Zoloft 12/06/2019. Prescribed at discharge  --Elevated blood pressure x 3 on 12/06/2019, otherwise normotensive without history of HTN. Patient to have BP checked during incision check 12/18/2019. Warning signed reviewed with patient prior to discharge  Mallie Snooks, MSN, CNM Certified Nurse Midwife, Faculty Practice 12/07/19 10:29 AM

## 2019-12-04 NOTE — Op Note (Signed)
Yesenia Weeks PROCEDURE DATE: 12/04/2019  PREOPERATIVE DIAGNOSES: Intrauterine pregnancy at [redacted]w[redacted]d weeks gestation; BPP 2/8, SROM/SOL, HSV outbreak; Post-placental IUD insertion  POSTOPERATIVE DIAGNOSES: The same  PROCEDURE: Primary Low Transverse Cesarean Section  SURGEON:  Dr. Arlina Robes - Primary Dr. Barrington Ellison - Fellow  ANESTHESIOLOGY TEAM: Anesthesiologist: Lidia Collum, MD CRNA: Rayvon Char, CRNA  INDICATIONS: Yesenia Weeks is a 44 y.o. [id number] at [redacted]w[redacted]d here for cesarean section secondary to the indications listed under preoperative diagnoses; please see preoperative note for further details.  The risks of cesarean section were discussed with the patient including but were not limited to: bleeding which may require transfusion or reoperation; infection which may require antibiotics; injury to bowel, bladder, ureters or other surrounding organs; injury to the fetus; need for additional procedures including hysterectomy in the event of a life-threatening hemorrhage; placental abnormalities wth subsequent pregnancies, incisional problems, thromboembolic phenomenon and other postoperative/anesthesia complications.   The patient concurred with the proposed plan, giving informed written consent for the procedure.    FINDINGS:  Viable female infant in cephalic presentation. Clear amniotic fluid.  Intact placenta, three vessel cord.  Normal uterus, fallopian tubes and ovaries bilaterally. APGAR (1 MIN): 9   APGAR (5 MINS): 9   APGAR (10 MINS):    ANESTHESIA: Spinal INTRAVENOUS FLUIDS: 1800 ml   ESTIMATED BLOOD LOSS: 389 ml URINE OUTPUT:  300 ml SPECIMENS: Placenta sent to L&D COMPLICATIONS: None immediate  PROCEDURE IN DETAIL:  The patient preoperatively received intravenous antibiotics and had sequential compression devices applied to her lower extremities.  She was then taken to the operating room where spinal anesthesia was administered and was found to be adequate. She was  then placed in a dorsal supine position with a leftward tilt, and prepped and draped in a sterile manner.  A foley catheter was placed into her bladder and attached to constant gravity.  After an adequate timeout was performed, a Pfannenstiel skin incision was made with scalpel and carried through to the underlying layer of fascia. The fascia was incised in the midline, and this incision was extended bilaterally using blunt traction. The rectus muscles were separated in the midline and the peritoneum was entered bluntly. The Alexis self-retaining retractor was introduced into the abdominal cavity.  Attention was turned to the lower uterine segment where a low transverse hysterotomy was made with a scalpel and extended bilaterally bluntly.  The infant was successfully delivered, the cord was clamped and cut after one minute, and the infant was handed over to the awaiting neonatology team. Uterine massage was then administered, and the placenta delivered intact with a three-vessel cord. The uterus was then cleared of clots and debris. Liletta IUD removed from inserter, strings trimmed and placed manually to the uterine fundus; strings directed through cervix with ringed forceps. The hysterotomy was closed with 1 Chromic in a running locked fashion, and an imbricating layer was also placed with 1 Chromic. The pelvis was cleared of all clot and debris. Hemostasis was confirmed on all surfaces.  The retractor was removed.  The peritoneum and rectus muscles were closed with a 2-0 Vicryl running stitch. The fascia was then closed using 1 Vicryl in a running fashion.  The subcutaneous layer was irrigated and reapproximated with 2-0 plain gut running stitches and the skin was closed with a 4-0 Vicryl subcuticular stitch. The patient tolerated the procedure well. Sponge, instrument and needle counts were correct x 3.  She was taken to the recovery room in stable condition.  Jerilynn Birkenhead, MD [facility] Family Medicine Fellow,  [employer] for [employer], [facility]

## 2019-12-05 ENCOUNTER — Other Ambulatory Visit (HOSPITAL_COMMUNITY): Payer: Medicaid Other

## 2019-12-05 MED ORDER — LORATADINE 10 MG PO TABS
10.0000 mg | ORAL_TABLET | Freq: Every day | ORAL | Status: DC
  Administered 2019-12-05 – 2019-12-07 (×3): 10 mg via ORAL
  Filled 2019-12-05 (×3): qty 1

## 2019-12-05 MED ORDER — FLUTICASONE PROPIONATE 50 MCG/ACT NA SUSP
1.0000 | NASAL | Status: DC | PRN
  Administered 2019-12-05 – 2019-12-06 (×2): 1 via NASAL
  Filled 2019-12-05: qty 16

## 2019-12-05 NOTE — Progress Notes (Signed)
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 Patient has not taken this medication in years, so would like to talk with the MD before starting it.

## 2019-12-05 NOTE — Progress Notes (Signed)
Circumcision Counseling Progress Note  Patient desires circumcision for her female infant.  Circumcision procedure details discussed, risks and benefits of procedure were also discussed.  These include but are not limited to: Benefits of circumcision in men include reduction in the rates of urinary tract infection (UTI), penile cancer, some sexually transmitted infections, penile inflammatory and retractile disorders, as well as easier hygiene.  Risks include bleeding , infection, injury of glans which may lead to penile deformity or urinary tract issues, unsatisfactory cosmetic appearance and other potential complications related to the procedure.  It was emphasized that this is an elective procedure.  Patient wants to proceed with circumcision; written informed consent obtained.  Will do circumcision soon, routine circumcision and post circumcision care ordered for the infant.  Clayton Bibles, MSN, CNM Certified Nurse Midwife, [facility] for [employer], [facility] 12/05/19 11:43 AM

## 2019-12-05 NOTE — Lactation Note (Signed)
This note was copied from a baby's chart. Lactation Consultation Note  Patient Name: [name] Today's Date: 12/05/2019 Reason for consult: Follow-up assessment;Nipple pain/trauma;Early term 31-38.6wks Baby is 28 hours old/3% weight loss.  Mom currently has baby latched in cradle hold and attempting to remove baby from breast due to nipple pain.  Instructed on breaking suction before removing baby.  Nipple has small abrasion.  Baby still acting hungry and I offered assist on opposite breast.  Positioned baby in football hold on left.  Baby opened wide and latched easily.  Mom felt initial latch on pain which improved as feeding progressed.  Swallows noted.  Mom using breast massage during feeding.  Discussed cluster feeding.  Comfort gels given with instructions.  Encouraged to call for assist prn.  Maternal Data    Feeding Feeding Type: Breast Fed  LATCH Score Latch: Grasps breast easily, tongue down, lips flanged, rhythmical sucking.  Audible Swallowing: Spontaneous and intermittent  Type of Nipple: Everted at rest and after stimulation  Comfort (Breast/Nipple): Filling, red/small blisters or bruises, mild/mod discomfort  Hold (Positioning): Assistance needed to correctly position infant at breast and maintain latch.  LATCH Score: 8  Interventions    Lactation Tools Discussed/Used     Consult Status Consult Status: Follow-up Date: 12/06/19 Follow-up type: In-patient    Huston Foley 12/05/2019, 11:37 AM

## 2019-12-05 NOTE — Lactation Note (Addendum)
This note was copied from a baby's chart. Lactation Consultation Note  Patient Name: Yesenia Weeks Today's Date: 12/05/2019 Reason for consult: Initial assessment;Early term 37-38.6wks P4, 23 hour ETI female infant,- 3% weight loss. Mom with hx: of AMA, GDM, Past hx of drug ( cocaine) use in 2018, HSV-valtrex with C/S delivery. Mom is active on the [facility] Program in [facility] she doesn't have DEBP at home, [facility] referral sent for [employer].  Per mom, infant has not been latching well at breast not sustaining latch. LC entered room, mom was attempting to latch infant. Mom latched infant on her right breast with pillow support using the football hold position, LC ask mom to wait until infant's mouth is wide with tongue down, infant latched without difficulty, swallows observed and infant sustained latch. Infant was still breastfeeding after 12 minutes when LC left room. Mom knows to call RN or LC if she needs further assistance with latching infant at breast. Mom knows to breastfeed infant according to hunger cues, on demand, 8 to 12+ times within 24 hours. Reviewed Baby & Me book's Breastfeeding Basics.  I have discussed with the patient how to reduce likelihood of falling at home by removing throw rugs, loose wires or other objects from floor, installing hand-rails in bathrooms, tubs and halls, and leaving some lights on at night.  Maternal Data Formula Feeding for Exclusion: Yes Reason for exclusion: Mother's choice to formula and breast feed on admission Has patient been taught Hand Expression?: Yes Does the patient have breastfeeding experience prior to this delivery?: Yes  Feeding Feeding Type: Breast Fed  LATCH Score Latch: Grasps breast easily, tongue down, lips flanged, rhythmical sucking.  Audible Swallowing: Spontaneous and intermittent  Type of Nipple: Everted at rest and after stimulation  Comfort (Breast/Nipple): Soft / non-tender  Hold (Positioning): Assistance  needed to correctly position infant at breast and maintain latch.  LATCH Score: 9  Interventions Interventions: Breast feeding basics reviewed;Breast compression;Adjust position;Assisted with latch;Skin to skin;Support pillows;Breast massage;Position options;Hand express;Expressed milk  Lactation Tools Discussed/Used WIC Program: Yes   Consult Status Consult Status: Follow-up Date: 12/05/19 Follow-up type: In-patient    Danelle Earthly 12/05/2019, 6:55 AM

## 2019-12-05 NOTE — Progress Notes (Signed)
Subjective: Postpartum Day 1: Cesarean Delivery Patient reports tolerating PO, + flatus and no problems voiding.    Objective: Vital signs in last 24 hours: Temp:  [98 F (36.7 C)-98.7 F (37.1 C)] 98.5 F (36.9 C) (05/15 0556) Pulse Rate:  [62-87] 77 (05/15 0556) Resp:  [18] 18 (05/15 0556) BP: (105-127)/(65-78) 119/68 (05/15 0556) SpO2:  [99 %-100 %] 100 % (05/15 0556)  Physical Exam:  General: alert, cooperative, appears stated age and no distress Lochia: appropriate Uterine Fundus: firm Incision: Old dried drainage, dressing intact DVT Evaluation: No evidence of DVT seen on physical exam.  Recent Labs    12/03/19 1730 12/05/19 0713  HGB 11.7* 8.9*  HCT 37.5 29.3*    Assessment/Plan: New rx Fe Status post Cesarean section. Doing well postoperatively.  Continue current care.  Calvert Cantor, CNM 12/05/2019, 11:41 AM

## 2019-12-05 NOTE — Progress Notes (Addendum)
CSW received consult for hx of Anxiety and Depression.    CSW met with MOB at bedside to offer support and complete assessment. MOB was alone in room due to infant being in nursery receiving circumcision.  CSW congratulated MOB on the birth of infant,  advised MOB of CSW's role, and the reason for CSW coming to visit with her. MOB was insightful, engaged, and pleasant during visit.   MOB reported history of depression, anxiety, and postpartum depression after birth of second child(44 y/o). MOB reported during that period she was extremely sad, overwhelmed, and isolated. MOB added details of suicide attempt during that period, via slit wrist. MOB stated she has not had suicide attempts since that period. MOB reported postpartum lasted for about a year. She reported Rx for Zoloft and counseling helped tremendously. MOB denied any current SI, HI, or domestic violence. However, MOB stated she has been dealing with several stressors during this pregnancy such as health related concerns with newborn, caring for developmentally delayed 2 y/o, her mother's poor health(2 strokes), and relationship issues with FOB. MOB reported she has been talking with counselor Roselyn Reef who has referred her for long term counseling services. MOB also stated interest in locating therapist.  CSW provided list of local therapist and contact information. MOB requested CSW inform RN of desire to speak with doctor about Rx treatment for depression and anxiety, in efforts to prevent major postpartum metal health concerns. CSW informed RN, Santiago Glad following this visit with MOB. MOB identified her children, mom, dad, and friends as support system, and stated utilization of coping skills such as cleaning, walking, prayer, meditation, and talking with friends. MOB added plans to start journaling. MOB reported being connected with [employer], [employer], and [employer].   CSW provided education regarding the baby blues period  vs. perinatal mood disorders, discussed treatment and gave resources for mental health follow up if concerns arise.  CSW recommends self-evaluation during the postpartum time period using the New Mom Checklist from Postpartum Progress and encouraged MOB to contact a medical professional if symptoms are noted at any time.  MOB denied any questions.   CSW provided review of Sudden Infant Death Syndrome (SIDS) precautions. MOB reported need for baby clothing items, however confirmed having car seat and safe sleeping area via bassine. CSW provided MOB with baby bag from donations' room. MOB was appreciative.    CSW identifies no further need for intervention and no barriers to discharge at this time.  Braxen Dobek D. Lissa Morales, MSW, [facility] Clinical Social Worker [phone]

## 2019-12-06 ENCOUNTER — Encounter (HOSPITAL_COMMUNITY): Payer: Self-pay | Admitting: Obstetrics and Gynecology

## 2019-12-06 LAB — CBC
HCT: 29.3 % — ABNORMAL LOW (ref 36.0–46.0)
Hemoglobin: 8.9 g/dL — ABNORMAL LOW (ref 12.0–15.0)
MCH: 25.4 pg — ABNORMAL LOW (ref 26.0–34.0)
MCHC: 30.4 g/dL (ref 30.0–36.0)
MCV: 83.5 fL (ref 80.0–100.0)
Platelets: 196 10*3/uL (ref 150–400)
RBC: 3.51 MIL/uL — ABNORMAL LOW (ref 3.87–5.11)
RDW: 15 % (ref 11.5–15.5)
WBC: 10.7 10*3/uL — ABNORMAL HIGH (ref 4.0–10.5)
nRBC: 0 % (ref 0.0–0.2)

## 2019-12-06 MED ORDER — SERTRALINE HCL 25 MG PO TABS
25.0000 mg | ORAL_TABLET | Freq: Every day | ORAL | Status: DC
  Administered 2019-12-06 – 2019-12-07 (×2): 25 mg via ORAL
  Filled 2019-12-06 (×2): qty 1

## 2019-12-06 MED ORDER — FUROSEMIDE 20 MG PO TABS
20.0000 mg | ORAL_TABLET | Freq: Once | ORAL | Status: AC
  Administered 2019-12-06: 20 mg via ORAL
  Filled 2019-12-06: qty 1

## 2019-12-06 NOTE — Lactation Note (Signed)
This note was copied from a baby's chart. Lactation Consultation Note  Patient Name: Yesenia Weeks Today's Date: 12/06/2019 Reason for consult: Follow-up assessment;Nipple pain/trauma   Mom states her right nipple is very sore and she cannot tolerate infant feeding on that side so has been feeding on left side only.  Upon examination, right breast is very full and tender; bruising observed on nipple.  Mom has attempted a couple of times to feed infant on that side but cannot tolerate the pain.   Mom asked about a "tongue tie".  Her first child had his tongue clipped in the hospital after birth.   Upon suck, infant had strong latch good seal, top lip tucked in but is able to flange.  Some humping felt with tongue on gloved finger,  possibly due to observed tightness toward the back underside of tongue.  Mom hand expressed into colostrum into bullet, 36ml from the full breast.  She has very little discomfort on the left side when infant feeds.  Laid back, football, and cross cradle attempted; mom could not tolerate pain.  Lips were flanged, gape wide, but mom felt he was "biting".  This pain was felt when LC heard swallows.  Infant fed 6 minutes on right side on and off pain was intermittent. Softening of the breast observed. No pinching of nipple observed.  LC gave mom option to pump right side or try NS.  Mom desired to try the NS.  Mom demonstrated correct application of size 24 NS.  Infant fed 5 minutes.  Pain continued with NS.  No milk seen after feed attempt.   Mom latched infant to left side with little pain felt, audible swallows heard.    LC provided manual pump to use and RN set up DEBP.  Mom will attempt with NS but if too painful, will use DEBP or manual to soften right side.  Curved tip syringe shown to mom; infant took 4 ml but would not take more.  RN and mom aware infant needs to take the rest of EBM with next BF.    Mom is leaking and has breast pads and coconut oil for comfort.     She is aware of OP services and was encouraged to call for an appointment.  She also has our phone number for phone line and support group info.       Maternal Data Has patient been taught Hand Expression?: Yes  Feeding Feeding Type: Breast Fed  LATCH Score Latch: Grasps breast easily, tongue down, lips flanged, rhythmical sucking.  Audible Swallowing: A few with stimulation  Type of Nipple: Everted at rest and after stimulation  Comfort (Breast/Nipple): Engorged, cracked, bleeding, large blisters, severe discomfort(right side severe pain (8-9) during feeding)  Hold (Positioning): Assistance needed to correctly position infant at breast and maintain latch.  LATCH Score: 6  Interventions Interventions: Breast feeding basics reviewed;Assisted with latch;Skin to skin;Breast massage;Hand express;Pre-pump if needed;Coconut oil;Expressed milk;Position options;Breast compression;Comfort gels;Hand pump;Adjust position;DEBP  Lactation Tools Discussed/Used Tools: Nipple Shields;Coconut oil(curved tip syringe) Nipple shield size: 24 WIC Program: Yes Pump Review: Setup, frequency, and cleaning;Milk Storage Date initiated:: 12/07/19   Consult Status Consult Status: Follow-up Date: 12/07/19 Follow-up type: In-patient    Maryruth Hancock [facility] 12/06/2019, 6:03 PM

## 2019-12-06 NOTE — Progress Notes (Signed)
Subjective: Postpartum Day 2: Cesarean Delivery Patient reports she has been ambulating and tolerating a regular diet. Passing flatus. Reports some anxiety and would like to restart Zoloft which she previously took several years ago. Also reports swelling in her feet that she has had for the last few weeks.   Objective: Vital signs in last 24 hours: Temp:  [98.4 F (36.9 C)-98.5 F (36.9 C)] 98.4 F (36.9 C) (05/16 1341) Pulse Rate:  [87-90] 90 (05/16 1352) Resp:  [16-17] 17 (05/16 1341) BP: (134-151)/(74-87) 143/87 (05/16 1352) SpO2:  [98 %-100 %] 100 % (05/16 1341)  Physical Exam:  General: alert, cooperative and appears stated age Lochia: appropriate Uterine Fundus: firm Incision: healing well, no significant drainage DVT Evaluation: No evidence of DVT seen on physical exam.  Recent Labs    12/03/19 1730 12/05/19 0713  HGB 11.7* 8.9*  HCT 37.5 29.3*    Assessment/Plan: Status post Cesarean section. Doing well postoperatively.  Continue current care. Discharge tomorrow per patient preference. Okay to discharge today should she change her mind; please page for orders. Breast and bottle feeding IUD placed intra-operatively Cont PO iron Last two BP's elevated but otherwise all WNL and no hx HTN; will cont to monitor. Patient currently feeling anxious as she is trying to coordinate things for once she is home.  Zoloft 25 mg initiated for anxiety Lasix 20 mg PO once for LE swelling  Camylle Whicker N Aseel Uhde 12/06/2019, 2:08 PM

## 2019-12-06 NOTE — Progress Notes (Addendum)
Patient stating that she has had pain in right hand and swelling in both feet for 2 weeks. This RN noted non-pitting edema bilaterally in feet and no swelling in right hand. BP 151/79 @1340  and then 143/87 @1350 . No other signs of hypertension noted by patient. Will notify MD and continue to monitor.

## 2019-12-07 ENCOUNTER — Inpatient Hospital Stay (HOSPITAL_COMMUNITY): Payer: Medicaid Other

## 2019-12-07 MED ORDER — OXYCODONE HCL 5 MG PO TABS: 5.0000 mg | ORAL_TABLET | ORAL | 0 refills | Status: AC | PRN

## 2019-12-07 MED ORDER — IBUPROFEN 800 MG PO TABS: 800.0000 mg | ORAL_TABLET | Freq: Four times a day (QID) | ORAL | 0 refills | Status: DC

## 2019-12-07 MED ORDER — ACETAMINOPHEN 325 MG PO TABS: 650.0000 mg | ORAL_TABLET | ORAL | 3 refills | Status: DC | PRN

## 2019-12-07 MED ORDER — SERTRALINE HCL 25 MG PO TABS: 25.0000 mg | ORAL_TABLET | Freq: Every day | ORAL | 3 refills | Status: DC

## 2019-12-07 MED ORDER — SIMETHICONE 80 MG PO CHEW: 80.0000 mg | CHEWABLE_TABLET | Freq: Three times a day (TID) | ORAL | 0 refills | Status: DC

## 2019-12-07 MED FILL — ACETAMINOPHEN 325 MG TABS: 325 | 3 days supply | Qty: 30 | Fill #0

## 2019-12-07 MED FILL — MI-ACID GAS 80 MG TAB CHEW: 80 | 10 days supply | Qty: 30 | Fill #0

## 2019-12-07 MED FILL — IBUPROFEN 800 MG TAB: 800 | 7 days supply | Qty: 30 | Fill #0

## 2019-12-07 MED FILL — oxyCODONE HCL 5 MG TABS: 5 | 3 days supply | Qty: 18 | Fill #0

## 2019-12-07 MED FILL — SERTRALINE HCL 25 MG TABS: 25 | 30 days supply | Qty: 30 | Fill #0

## 2019-12-07 NOTE — Lactation Note (Signed)
This note was copied from a baby's chart. Lactation Consultation Note  Patient Name: Yesenia Weeks Today's Date: 12/07/2019 Reason for consult: Follow-up assessment  P4 mother whose infant is now 27 hours old.  This is an ETI at 37+4 weeks.    Mother has been breast/bottle feeding.  Mother's nipples have been extremely sore and sensitive.  She has not been able to tolerate feedings on the right breast.  She has mostly latched on the left breast.  Upon observation, mother's breasts are very full; almost engorged at this time.    Completed breast massage and pumped mother's breasts for 15 minutes with a total of 135 mls of milk obtained.  Both breasts softer, however, right breast is still more full.  Mother stated she felt more comfortable.  Her right nippled is slightly abraded and she is using coconut oil for comfort.  Mother is wearing breast pads due to leakage.  Engorgement prevention/treatment reviewed.  Mother does not have a DEBP for home use.  I suggested she obtain a [facility] loaner pump today prior to discharge to keep her breasts from becoming engorged; educated her on the importance of frequent feeding and adequately emptying her breasts.  Mother plans to have her daughter bring in the $30 when she picks her up for discharge this afternoon.  Reviewed feeding/pumping plan for home.    With mother's permission I bottle fed her son an additional 35 mls of EBM while she pumped.  She was not interested in latching him at this time.  Burped well and fell asleep.  Placed him in the bassinet.  Mother has also tried the NS with the LC from yesterday but has not used this or pumped since the [facility] visit yesterday.  Mother stated that she did not know how to use the DEBP.    Baby has been discharged and mother has no further questions.  RN updated and will call me when mother is ready for her DEBP [facility] loaner.   Maternal Data    Feeding Feeding Type: Formula Nipple Type: Slow - flow  LATCH  Score Latch: (states nipples are tender to latch, supplemented only)                 Interventions    Lactation Tools Discussed/Used     Consult Status Consult Status: Complete Date: 12/07/19 Follow-up type: Call as needed    Tearra Ouk R Jaquan Sadowsky 12/07/2019, 11:50 AM

## 2019-12-07 NOTE — Discharge Instructions (Signed)
General Post-Operative Instructions You may expect to feel dizzy, weak, and drowsy for as long as 24 hours after receiving the medicine that made you sleep (anesthetic).  Do not drive a car, ride a bicycle, participate in physical activities, or take public transportation until you are done taking narcotic pain medicines or as directed by your doctor.  Do not drink alcohol or take tranquilizers.  Do not take medicine that has not been prescribed by your doctor.  Do not sign important papers or make important decisions while on narcotic pain medicines.  Have a responsible person with you.  CARE OF INCISION  Keep incision clean and dry. Take showers instead of baths until your doctor gives you permission to take baths.  Avoid heavy lifting (more than 10 pounds/4.5 kilograms), pushing, or pulling.  Avoid activities that may risk injury to your surgical site.  No sexual intercourse or placement of anything in the vagina for 6 weeks or as instructed by your doctor. If you have tubes coming from the wound site, check with your doctor regarding appropriate care of the tubes. Only take prescription or over-the-counter medicines  for pain, discomfort, or fever as directed by your doctor. Do not take aspirin. It can make you bleed. Take medicines (antibiotics) that kill germs if they are prescribed for you.  Call the office or go to the MAU if:  You feel sick to your stomach (nauseous).  You start to throw up (vomit).  You have trouble eating or drinking.  You have an oral temperature above 101.  You have constipation that is not helped by adjusting diet or increasing fluid intake. Pain medicines are a common cause of constipation.  You have any other concerns. SEEK IMMEDIATE MEDICAL CARE IF:  You have persistent dizziness.  You have difficulty breathing or a congested sounding (croupy) cough.  You have an oral temperature above 102.5, not controlled by medicine.  There is increasing pain or  tenderness near or in the surgical site.    

## 2019-12-10 ENCOUNTER — Ambulatory Visit: Payer: Medicaid Other

## 2019-12-10 ENCOUNTER — Other Ambulatory Visit: Payer: Medicaid Other

## 2019-12-16 NOTE — Congregational Nurse Program (Signed)
TC and text to client, she had her baby on May 14th, she is doing better but had some problems with post partum depression.  Her blood pressure is still running high but she has a dr appt on Friday.  She will talk with the dr. Then.  She is still needing some storage(chest, dresser) for baby clothes.  She is getting more able to do some housework.  She needs some more baby clothes.  I will research referral sources.  Will offer BP checks if needed.  She is to call if needed.  Will followup in 1 week.   Dept: [phone]   Congregational Nurse Program Note  Date of Encounter: 12/16/2019  Past Medical History: Past Medical History:  Diagnosis Date  . ADHD (attention deficit hyperactivity disorder)   . Allergy   . Anemia    iron def  . Anxiety    stressed with preg, worry about baby  . Asthma   . BV (bacterial vaginosis)    recurrent-uses boric acid  . Depression   . Dysmenorrhea   . Gestational diabetes   . HSV infection   . Infection    UTI  . Ovarian cyst    left, states was removed  . Preterm labor   . Seizures (HCC)    up until age 70  . Substance abuse [facility])     Encounter Details: CNP Questionnaire - 12/16/19 1523      Questionnaire   Patient Status  Not Applicable    Race  Black or African American    Location Patient Served At  [facility]  No food insecurities    Housing/Utilities  Yes, have permanent housing    Transportation  No transportation needs    Interpersonal Safety  Yes, feel physically and emotionally safe where you currently live    Medication  No medication insecurities    Medical Provider  Yes    Referrals  Primary Care Provider/Clinic    ED Visit Averted  Not Applicable    Life-Saving Intervention Made  Not Applicable

## 2019-12-18 ENCOUNTER — Other Ambulatory Visit: Payer: Self-pay

## 2019-12-18 ENCOUNTER — Ambulatory Visit (INDEPENDENT_AMBULATORY_CARE_PROVIDER_SITE_OTHER): Payer: Medicaid Other | Admitting: *Deleted

## 2019-12-18 VITALS — BP 132/90 | HR 72 | Temp 98.6°F | Wt 185.4 lb

## 2019-12-18 DIAGNOSIS — Z48816 Encounter for surgical aftercare following surgery on the genitourinary system: Secondary | ICD-10-CM

## 2019-12-18 DIAGNOSIS — Z4889 Encounter for other specified surgical aftercare: Secondary | ICD-10-CM

## 2019-12-18 LAB — GLUCOSE, CAPILLARY: Glucose-Capillary: 86 mg/dL (ref 70–99)

## 2019-12-18 MED ORDER — SERTRALINE HCL 25 MG PO TABS: 50.0000 mg | ORAL_TABLET | Freq: Every day | ORAL | 3 refills | Status: DC

## 2019-12-18 NOTE — Progress Notes (Signed)
Pt here for incision check. Assessment completed - honeycomb dressing and steristrips removed. Incision was found to be well healed and without redness, swelling or drainage. Skin edges were well approximated. Pt advised of proper hygiene care of incision. Pt has been having frequent H/A and blurry vision. Per chart review, pt had BP elevation 141/80 recorded to [facility] earlier this week. She also reports increased anxiety/depression related to infant care and daily life. 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 Consult with Dr. Alysia Penna who ordered increase Zoloft to 50 mg daily - pt informed. Due to pt having elevated BP as well as H/A and blurry vision, pt was advised of need for evaluation @ [facility] today. Pt states that her newborn baby has appt @ 2pm today and she will go to [facility] following that appt. Pt voiced understanding of all information and instructions given.

## 2019-12-22 NOTE — Progress Notes (Signed)
Agree with A & P. 

## 2019-12-29 ENCOUNTER — Telehealth (INDEPENDENT_AMBULATORY_CARE_PROVIDER_SITE_OTHER): Payer: Medicaid Other

## 2019-12-29 DIAGNOSIS — O165 Unspecified maternal hypertension, complicating the puerperium: Secondary | ICD-10-CM

## 2019-12-29 MED ORDER — ENALAPRIL MALEATE 5 MG PO TABS: 5.0000 mg | ORAL_TABLET | Freq: Every day | ORAL | 0 refills | Status: DC

## 2019-12-29 NOTE — Telephone Encounter (Addendum)
Pt called and spoke with front office staff, reports having questions for clinical staff. Call transferred to clinical staff. Pt states her IUD is causing discomfort, the strings are present outside of her vagina and causing irritation. Also reports continued vaginal bleeding with a foul odor. Pt states she would like the IUD taken out and would like to begin taking Depo Provera for contraception. Per chart review, pt has had elevated BPs and was asked to follow up at [facility]. Pt states she was unable to go to the hospital due to childcare. Reports continued headaches and blurry vision with elevated BPs. I asked pt to sit and wait 10 minutes before checking BP. I explained if BP is still elevated she will need to go to [facility] for evaluation. Called pt after 10 minutes. No answer; VM left.   Pt returned call. States BP today was 138/90. Denies any s/s of hypertension. Reviewed recent hx with Crissie Reese, MD who gives verbal order for enalapril 5 mg daily and states pt should return to [facility] with any symptoms or elevated BP. Crissie Reese gives verbal order for BMP to be drawn at [facility] visit. Provider recommendation given to pt. Explained pt should go to [facility] if she has any symptoms with BP 140/90 or higher. Pt verbalizes understanding and will follow up at [facility] visit on 01/04/20. Instructed pt to tuck IUD strings into vagina and to contact the office if she is experiencing any pain or has any changes with IUD.

## 2019-12-31 ENCOUNTER — Other Ambulatory Visit: Payer: Self-pay | Admitting: Family Medicine

## 2019-12-31 DIAGNOSIS — Z1231 Encounter for screening mammogram for malignant neoplasm of breast: Secondary | ICD-10-CM

## 2020-01-02 ENCOUNTER — Other Ambulatory Visit: Payer: Self-pay | Admitting: Family Medicine

## 2020-01-02 DIAGNOSIS — O165 Unspecified maternal hypertension, complicating the puerperium: Secondary | ICD-10-CM

## 2020-01-04 ENCOUNTER — Encounter: Payer: Self-pay | Admitting: Certified Nurse Midwife

## 2020-01-04 ENCOUNTER — Other Ambulatory Visit (HOSPITAL_COMMUNITY)
Admission: RE | Admit: 2020-01-04 | Discharge: 2020-01-04 | Disposition: A | Discharge status: Home / Self Care | Payer: Medicaid Other | Source: Ambulatory Visit | Attending: Certified Nurse Midwife | Admitting: Certified Nurse Midwife

## 2020-01-04 ENCOUNTER — Ambulatory Visit (INDEPENDENT_AMBULATORY_CARE_PROVIDER_SITE_OTHER): Payer: Medicaid Other | Admitting: Certified Nurse Midwife

## 2020-01-04 ENCOUNTER — Other Ambulatory Visit: Payer: Self-pay

## 2020-01-04 DIAGNOSIS — N898 Other specified noninflammatory disorders of vagina: Secondary | ICD-10-CM | POA: Insufficient documentation

## 2020-01-04 DIAGNOSIS — Z30432 Encounter for removal of intrauterine contraceptive device: Secondary | ICD-10-CM

## 2020-01-04 DIAGNOSIS — K5900 Constipation, unspecified: Secondary | ICD-10-CM

## 2020-01-04 DIAGNOSIS — Z3042 Encounter for surveillance of injectable contraceptive: Secondary | ICD-10-CM | POA: Diagnosis not present

## 2020-01-04 DIAGNOSIS — Z3009 Encounter for other general counseling and advice on contraception: Secondary | ICD-10-CM

## 2020-01-04 DIAGNOSIS — N3000 Acute cystitis without hematuria: Secondary | ICD-10-CM

## 2020-01-04 DIAGNOSIS — O165 Unspecified maternal hypertension, complicating the puerperium: Secondary | ICD-10-CM

## 2020-01-04 MED ORDER — DOCUSATE SODIUM 100 MG PO CAPS: 100.0000 mg | ORAL_CAPSULE | Freq: Two times a day (BID) | ORAL | 2 refills | Status: DC | PRN

## 2020-01-04 MED ORDER — MEDROXYPROGESTERONE ACETATE 150 MG/ML IM SUSP
150.0000 mg | Freq: Once | INTRAMUSCULAR | Status: AC
  Administered 2020-01-04: 150 mg via INTRAMUSCULAR

## 2020-01-04 NOTE — Progress Notes (Signed)
Post Partum Visit Note  Yesenia Weeks is a 44 y.o. [phone] female who presents for a postpartum visit. She is 4 weeks postpartum following a primary cesarean section.  I have fully reviewed the prenatal and intrapartum course. The delivery was at [redacted]w[redacted]d for BPP 2/8, SROM/SOL, HSV outbreak and post-placental IUD insertion.  Anesthesia: spinal. Postpartum course has been complicated by constipation, vaginal irritation, and urinary frequency. Baby is doing well. Baby is feeding by bottle Dory Horn Good start, soothe pro. Bleeding dark red spotting. Bowel function is abnormal: constipation, last BM 3 days ago . Bladder function is normal. Patient is not sexually active. Contraception method is IUD. Postpartum depression screening: negative.  The following portions of the patient's history were reviewed and updated as appropriate: allergies, current medications, past medical history, past surgical history and problem list.  Review of Systems Pertinent items noted in HPI and remainder of comprehensive ROS otherwise negative.    Objective:  Blood pressure 118/74, pulse 74, weight 180 lb 14.4 oz (82.1 kg), last menstrual period 05/18/2019, not currently breastfeeding.  General:  alert, cooperative and no distress   Breasts:  inspection negative, no nipple discharge or bleeding, no masses or nodularity palpable  Lungs: clear to auscultation bilaterally  Heart:  regular rate and rhythm  Abdomen: soft, non-tender; bowel sounds normal; no masses,  no organomegaly   Vulva:  normal  Vagina: small amount of dark red vaginal bleeding present, ~9cm of IUD strings hanging outside of vagina  Cervix:  not evaluated  Corpus: normal size, contour, position, consistency, mobility, non-tender  Adnexa:  normal adnexa and no mass, fullness, tenderness  Rectal Exam: Not performed.        PROCEDURES:  IUD Removal  Patient was in the dorsal lithotomy position. ~9cm of IUD strings hanging outside of vaginal  introitus. The strings of the IUD were grasped and pulled using hand. The IUD was removed in its entirety. Patient tolerated the procedure well.    Patient will use Depo for contraception. Routine preventative health maintenance measures emphasized.  Assessment/Plan:  1. Postpartum care and examination - Normal postpartum exam. Pap smear not done at today's visit.  - Urine Culture - medroxyPROGESTERone (DEPO-PROVERA) injection 150 mg  2. Birth control counseling - Educated and discussed birth control options in detail with patient.  - Patient wants IUD removed today, d/t irritation, strings and abdominal cramping - educated and discussed with patient that PP IUD placement during C/S that strings are not cut until postpartum appointment  - Discussed with patient that we can cut strings today and reassess patient's symptoms if not better in 2-3 weeks then can proceed to remove IUD and go to another method  - Patient request IUD removal today and going to Depo until patient has time to reassess methods for contraception.  3. Vaginal irritation - Cervicovaginal ancillary only( )  4. Constipation in female - docusate sodium (COLACE) 100 MG capsule; Take 1 capsule (100 mg total) by mouth 2 (two) times daily as needed.  Dispense: 30 capsule; Refill: 2  5. Hypertension, postpartum condition or complication - Patient was started on enalapril on 6/8, patient BP stable today  - Encouraged to continue taking medication as prescribed and will reassess BP in 2 weeks after postpartum period is over, patient verbalizes understanding  - Basic Metabolic Panel (BMET)  6. Encounter for Depo-Provera contraception - medroxyPROGESTERone (DEPO-PROVERA) injection 150 mg  7. Encounter for IUD removal - See above procedure note   Plan:  Essential components of care per ACOG recommendations:  1.  Mood and well being: Patient with negative depression screening today. Reviewed local resources for  support.  - Patient does not use tobacco. - hx of drug use? No   2. Infant care and feeding:  -Patient currently breastmilk feeding? No -Social determinants of health (SDOH) reviewed in EPIC. No concerns  3. Sexuality, contraception and birth spacing - Patient does not want a pregnancy in the next year.  Desired family size is 2 children.  - Reviewed forms of contraception in tiered fashion. Patient desired Depo-Provera today.   - Discussed birth spacing of 18 months  4. Sleep and fatigue -Encouraged family/partner/community support of 4 hrs of uninterrupted sleep to help with mood and fatigue  5. Physical Recovery  - Discussed patients delivery and complications - Patient has urinary incontinence? No - Patient is safe to resume physical and sexual activity  6.  Health Maintenance - Last pap smear done 07/2019 and was normal with negative HPV. - Mammogram scheduled for 01/11/20   Sharyon Cable, CNM [facility] [employer]

## 2020-01-04 NOTE — Patient Instructions (Signed)

## 2020-01-05 LAB — BASIC METABOLIC PANEL
BUN/Creatinine Ratio: 15 (ref 9–23)
BUN: 13 mg/dL (ref 6–24)
CO2: 21 mmol/L (ref 20–29)
Calcium: 9.7 mg/dL (ref 8.7–10.2)
Chloride: 105 mmol/L (ref 96–106)
Creatinine, Ser: 0.85 mg/dL (ref 0.57–1.00)
GFR calc Af Amer: 98 mL/min/{1.73_m2} (ref >59)
GFR calc non Af Amer: 85 mL/min/{1.73_m2} (ref >59)
Glucose: 90 mg/dL (ref 65–99)
Potassium: 4.4 mmol/L (ref 3.5–5.2)
Sodium: 142 mmol/L (ref 134–144)

## 2020-01-05 LAB — CERVICOVAGINAL ANCILLARY ONLY
Bacterial Vaginitis (gardnerella): NEGATIVE
Candida Glabrata: NEGATIVE
Candida Vaginitis: NEGATIVE
Chlamydia: NEGATIVE
Comment: NEGATIVE
Comment: NEGATIVE
Comment: NEGATIVE
Comment: NEGATIVE
Comment: NEGATIVE
Comment: NORMAL
Neisseria Gonorrhea: NEGATIVE
Trichomonas: NEGATIVE

## 2020-01-06 LAB — URINE CULTURE

## 2020-01-06 MED ORDER — AMOXICILLIN 500 MG PO CAPS: 500.0000 mg | ORAL_CAPSULE | Freq: Two times a day (BID) | ORAL | 0 refills | Status: AC

## 2020-01-06 NOTE — Addendum Note (Signed)
Addended by: Sharyon Cable on: 01/06/2020 02:43 PM   Modules accepted: Orders

## 2020-01-11 ENCOUNTER — Ambulatory Visit: Payer: Medicaid Other

## 2020-01-14 ENCOUNTER — Ambulatory Visit
Admission: RE | Admit: 2020-01-14 | Discharge: 2020-01-14 | Disposition: A | Discharge status: Home / Self Care | Payer: Medicaid Other | Source: Ambulatory Visit | Attending: Family Medicine | Admitting: Family Medicine

## 2020-01-14 ENCOUNTER — Other Ambulatory Visit: Payer: Self-pay

## 2020-01-14 DIAGNOSIS — Z1231 Encounter for screening mammogram for malignant neoplasm of breast: Secondary | ICD-10-CM

## 2020-01-18 ENCOUNTER — Encounter: Payer: Self-pay | Admitting: *Deleted

## 2020-01-19 ENCOUNTER — Other Ambulatory Visit: Payer: Medicaid Other

## 2020-01-19 ENCOUNTER — Ambulatory Visit: Payer: Medicaid Other

## 2020-01-22 ENCOUNTER — Other Ambulatory Visit: Payer: Self-pay

## 2020-01-22 ENCOUNTER — Encounter: Payer: Self-pay | Admitting: *Deleted

## 2020-01-22 ENCOUNTER — Other Ambulatory Visit: Payer: Medicaid Other

## 2020-01-22 ENCOUNTER — Ambulatory Visit (INDEPENDENT_AMBULATORY_CARE_PROVIDER_SITE_OTHER): Payer: Medicaid Other | Admitting: *Deleted

## 2020-01-22 VITALS — BP 129/81 | HR 67 | Ht 65.0 in | Wt 182.7 lb

## 2020-01-22 DIAGNOSIS — Z0130 Encounter for examination of blood pressure without abnormal findings: Secondary | ICD-10-CM

## 2020-01-22 DIAGNOSIS — R519 Headache, unspecified: Secondary | ICD-10-CM

## 2020-01-22 DIAGNOSIS — O165 Unspecified maternal hypertension, complicating the puerperium: Secondary | ICD-10-CM

## 2020-01-22 DIAGNOSIS — H538 Other visual disturbances: Secondary | ICD-10-CM

## 2020-01-22 MED ORDER — ENALAPRIL MALEATE 5 MG PO TABS: 5.0000 mg | ORAL_TABLET | Freq: Every day | ORAL | 0 refills | Status: DC

## 2020-01-22 NOTE — Progress Notes (Signed)
Pt here for BP check - 129/81, P- 67.  She is also having PP 2hr GTT today. Pt reports some occasional H/A which are relieved with tylenol. She also has occasional blurry vision. Pt states she misplaced her enalapril one week ago and has not taken the medication for that length of time. Refill was sent to her pharmacy and pt was advised to contact her PCP for follow up care and management of HTN. Pt also reports that the recent increase in Zoloft to 50 mg daily has been helpful, however she is feeling increasing anxiety and stress with daily life. I offered follow up appt with Asher Muir and pt accepted. Pt will be notified of 2hr GTT results via MyChart. She voiced understanding of all information and instructions given.

## 2020-01-23 LAB — GLUCOSE TOLERANCE, 2 HOURS
Glucose, 2 hour: 104 mg/dL (ref 65–139)
Glucose, GTT - Fasting: 86 mg/dL (ref 65–99)

## 2020-01-26 NOTE — Progress Notes (Signed)
I have reviewed this chart and agree with the RN/CMA assessment and management.    K. Meryl Izaiah Tabb, M.D. Attending [facility]   

## 2020-02-03 ENCOUNTER — Other Ambulatory Visit: Payer: Self-pay | Admitting: Family Medicine

## 2020-02-03 DIAGNOSIS — A6000 Herpesviral infection of urogenital system, unspecified: Secondary | ICD-10-CM

## 2020-02-11 ENCOUNTER — Telehealth: Payer: Self-pay | Admitting: Family Medicine

## 2020-02-11 NOTE — Telephone Encounter (Signed)
Patient called into the office wanting to know the status of her prescription refill status. Patient stated that the pharmacy is still waiting for if to be approved. Patient instructed that a message will be sent to the nurses and they will contact her as soon as they can. Patient verbalized understanding and message sent to clinical pool.

## 2020-02-12 ENCOUNTER — Other Ambulatory Visit: Payer: Self-pay | Admitting: Lactation Services

## 2020-02-12 ENCOUNTER — Other Ambulatory Visit: Payer: Self-pay | Admitting: Family Medicine

## 2020-02-12 DIAGNOSIS — A6000 Herpesviral infection of urogenital system, unspecified: Secondary | ICD-10-CM

## 2020-02-12 MED ORDER — VALACYCLOVIR HCL 1 G PO TABS
1000.0000 mg | ORAL_TABLET | Freq: Two times a day (BID) | ORAL | 99 refills | Status: AC
Start: 2020-02-12 — End: 2020-02-17

## 2020-02-12 NOTE — Telephone Encounter (Signed)
Called patient back. Patient did not answer. LM that prescription has been sent to her pharmacy.

## 2020-02-23 ENCOUNTER — Encounter: Payer: Self-pay | Admitting: Family Medicine

## 2020-02-23 ENCOUNTER — Ambulatory Visit (INDEPENDENT_AMBULATORY_CARE_PROVIDER_SITE_OTHER): Payer: Medicaid Other | Admitting: Family Medicine

## 2020-02-23 ENCOUNTER — Other Ambulatory Visit: Payer: Self-pay

## 2020-02-23 DIAGNOSIS — M7742 Metatarsalgia, left foot: Secondary | ICD-10-CM

## 2020-02-23 DIAGNOSIS — M7740 Metatarsalgia, unspecified foot: Secondary | ICD-10-CM | POA: Insufficient documentation

## 2020-02-23 NOTE — Assessment & Plan Note (Signed)
Low suspicion for Morton's neuroma, plantar fasciitis.  Based on history, I have a low suspicion for stress fracture although this could be considered if it does not improve with basic measures.  For now, will treat with over-the-counter NSAIDs and additional padding. -OTC ibuprofen as needed -Some additional padding would be helpful, we discussed over-the-counter shoe inserts versus metatarsal pads which could be purchased on [employer] -Follow-up if no improvement in 2 months

## 2020-02-23 NOTE — Progress Notes (Signed)
    SUBJECTIVE:   CHIEF COMPLAINT / HPI:   Mom has previously been seen by her OB provider for a postpartum visit.  Today she wanted to focus on her C-section scar and foot pain.  C-section scar Mom notes that she still experiences mild tenderness along the right side of her C-section scar and she was concerned that it may still be bleeding.  She notices just in the past several days when she was trying to examine it.  She occasionally experiences mild lower abdominal pain only around her incision and denies nausea, vomiting, fevers.  Metatarsalgia She for started to notice his foot pain prior to her pregnancy.  The foot pain seems to come and go and has been particularly bothersome for the past week or 2.  She notes that it feels primarily like a dull, aching pain on the underside of her left foot on the ball of her second or third toe.  Initially worsening in severity to the point where it is causing some difficulty walking.  She has not been taking any medication for it at home yet.  She does not recall any specific trauma or injury to the foot.  She is not experiencing any numbness or tingling.  PERTINENT  PMH / PSH: 3 months postpartum, gestational hypertension  OBJECTIVE:   BP 110/60   Pulse 88   Ht 5\' 5"  (1.651 m)   Wt 181 lb 4 oz (82.2 kg)   SpO2 99%   BMI 30.16 kg/m    General: Well-appearing middle-aged woman in no acute distress. Respiratory: Breathing comfortably on room air.  No respiratory distress. Abdomen: Soft, nontender to palpation.  Mild tenderness with palpation of the C-section scar, primarily on the right side.  1 to 2-3 mm opening at the center of the scar was notable.  This area seemed particularly tender to palpation.  No evidence of discharge or bleeding from this small, poorly healed area.  There is no evidence of erythema or ecchymosis surrounding the C-section scar.  Only mild tenderness was evident with palpation of her C-section scar.  Left  foot Inspection: Normal appearance without evidence of trauma or malformation Palpation: No tenderness to palpation of the malleoli, calcaneus or metatarsals ROM: Full ROM of the ankle and toes Neurovascularly intact No significant palpation to the plantar surface of the distal metatarsals.  No palpation of the palpation of the dorsal surface either. Negative squeeze test of the metatarsals.  ASSESSMENT/PLAN:   Metatarsalgia Low suspicion for Morton's neuroma, plantar fasciitis.  Based on history, I have a low suspicion for stress fracture although this could be considered if it does not improve with basic measures.  For now, will treat with over-the-counter NSAIDs and additional padding. -OTC ibuprofen as needed -Some additional padding would be helpful, we discussed over-the-counter shoe inserts versus metatarsal pads which could be purchased on -Follow-up if no improvement in 2 months   Poorly healing C-section scar Very low suspicion for wound infection or hematoma.  Possibly due to superficial knot/suture.  She was encouraged to follow-up with OB on a nonurgent basis.  [employer], MD [facility]

## 2020-02-23 NOTE — Progress Notes (Signed)
7/31

## 2020-02-23 NOTE — Patient Instructions (Signed)
C-section healing: Overall, I think that your scar appears well-healing and I am not concerned about bleeding into the surface or possible infection.  I do wonder if maybe there is some suture knot that is causing irritation and a little bit of discomfort and making it hard for the center of the scar to heal well.  I recommend that you follow-up with your obstetrician.  It does not need to be an urgent visit with your obstetrician but that would be the best person to ask about this issue.  Metatarsalgia: I think the pain in the bottom of your foot is caused by lots of pressure on the bones at the ball of your foot.  I think will be helpful to provide some extra cushioning to help take a little bit of pressure off of your foot.  There are couple different ways you can provide extra cushioning.  One way would be to buy an over-the-counter shoe insert that covers the whole bottom of your foot.  Another way would be to buy a metatarsal pad in place that a new shoe.  A metatarsal pad can be purchased on [employer] and there are a small variety of ways to use it.  If you do not feel comfortable buying the metatarsal pad, I would recommend going with the over-the-counter shoe inserts.  Lets try out this extra cushioning for a month or 2 and come back if your symptoms do not improve after about 2 months.

## 2020-03-02 ENCOUNTER — Telehealth: Payer: Self-pay | Admitting: Family Medicine

## 2020-03-02 NOTE — Telephone Encounter (Signed)
Patient called asking for a refill on Valtrex, stated her pharmacy said her RX was denied

## 2020-03-03 MED ORDER — VALACYCLOVIR HCL 500 MG PO TABS
500.0000 mg | ORAL_TABLET | Freq: Two times a day (BID) | ORAL | 99 refills | Status: AC
Start: 2020-03-03 — End: 2020-03-08

## 2020-03-03 NOTE — Telephone Encounter (Signed)
Returned patients call. She did not answer and phone went straight to voicemail. LM that prescription has been sent to her pharmacy.

## 2020-03-09 ENCOUNTER — Telehealth: Payer: Self-pay | Admitting: Family Medicine

## 2020-03-09 NOTE — Telephone Encounter (Signed)
Patient state that her C-Section scar is sore and its been bleeding want to speak with a nurse

## 2020-03-10 NOTE — Telephone Encounter (Signed)
Returned patients call. She did not answer. LM for patient to call the office if she has any further questions or concerns.

## 2020-03-15 ENCOUNTER — Ambulatory Visit: Payer: Medicaid Other | Admitting: Family Medicine

## 2020-03-22 ENCOUNTER — Telehealth: Payer: Self-pay | Admitting: Family Medicine

## 2020-03-22 NOTE — Telephone Encounter (Signed)
Will send to provider to please place referral for sports medicine.  Bartholomew Ramesh,CMA

## 2020-03-22 NOTE — Telephone Encounter (Signed)
Patient called requesting referral to Sports Medicine on [facility] for her foot. Pt saw Dr. Homero Fellers for this. Routing to blue team.

## 2020-03-23 NOTE — Telephone Encounter (Signed)
I would be happy to see her again in clinic.  We have not exhausted our workup yet.  If she feels strongly that she has to be seen by Sports med, I can place the referral.  Please call and ask her preference.  Mirian Mo, MD

## 2020-03-29 NOTE — Telephone Encounter (Signed)
Patient has an appointment with PCP on 04-07-20.  Yesenia Weeks,CMA

## 2020-03-31 ENCOUNTER — Other Ambulatory Visit: Payer: Self-pay | Admitting: Family Medicine

## 2020-03-31 ENCOUNTER — Telehealth: Payer: Self-pay | Admitting: Family Medicine

## 2020-03-31 DIAGNOSIS — M7742 Metatarsalgia, left foot: Secondary | ICD-10-CM

## 2020-03-31 NOTE — Telephone Encounter (Signed)
Will forward to md to please place referral for sports medicine.  Pt decline further work up in office.  Ah Bott,CMA

## 2020-03-31 NOTE — Telephone Encounter (Signed)
Referral placed. Yesenia Mcconico, MD  

## 2020-03-31 NOTE — Telephone Encounter (Signed)
Patient would like for Dr. Homero Fellers to go ahead and place referral to Sports Medicine. Thanks

## 2020-04-06 ENCOUNTER — Ambulatory Visit (INDEPENDENT_AMBULATORY_CARE_PROVIDER_SITE_OTHER): Payer: Medicaid Other | Admitting: Family Medicine

## 2020-04-06 ENCOUNTER — Other Ambulatory Visit: Payer: Self-pay

## 2020-04-06 DIAGNOSIS — M79672 Pain in left foot: Secondary | ICD-10-CM | POA: Diagnosis not present

## 2020-04-06 DIAGNOSIS — M7742 Metatarsalgia, left foot: Secondary | ICD-10-CM | POA: Diagnosis not present

## 2020-04-06 MED ORDER — MELOXICAM 15 MG PO TABS
ORAL_TABLET | ORAL | 0 refills | Status: DC
Start: 2020-04-06 — End: 2020-05-04

## 2020-04-06 NOTE — Progress Notes (Signed)
    SUBJECTIVE:   CHIEF COMPLAINT / HPI:   Left Foot Pain Yesenia Weeks is a very pleasnt 44y/o female who presents today for left foot pain that began initially one year ago. She states she never had any injury or inciting event. The pain began gradually and she then became pregnant and "just kind of forgot about it" but it never went away. She states walking and weight bearing makes it worse but she does have pain when resting her foot as well. The pain can seem random but does in general get better with rest and worse with activity. She describes it as a dull pain and denies numbness, tingling, or burning type pain.   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 No notable pes planus deformity. Hallux Valgus deformity of great toe. Transverse arch grossly intact; No evidence of tibiotalar deviation; Range of motion is full in all directions. Strength is 5/5 in all directions. No tenderness at the insertion of the Achilles tendon; No tenderness on posterior aspects of lateral and medial malleolus; Stable lateral and medial ligaments; No tenderness at the distal metatarsal heads; does have TTP inbetween the 3rd and 4th metatarsals. Able to walk 4 steps.  Special Tests:   - Thompson Squeeze test: NEG   - Anterior Drawer test: NEG   - Talar Tilt test: NEG   - Kleiger test: NEG   - Syndesmotic Squeeze test: NEG     Imaging Limited MSK U/S: Left Foot No obvious fracture of the cortical bone of the metatarsals. Small area of hypoechoic fluid collection in between the 3rd and 4th metatarsals. No obvious neuroma seen, no tissue irregularity seen.   ASSESSMENT/PLAN:   Left foot pain Unclear etiology as she has a negative squeeze test and no pain at the metatarsal heads. She has pain in between the metatarsals suggesting  Morton's neuroma but her pain symptoms do not match up well with a neuroma. Could be intermetatarsal bursitis as I did see a collection of fluid where she was tender. - Cont conservative management. Fit her with sports insoles with small metatarsal pads today. - Meloxicam 15mg  once daily for 15 days, then as needed - F/u in 4 weeks     , DO PGY-4, Sports Medicine Fellow [facility]  Addendum:  Patient seen in the office by fellow.  His history, exam, plan of care were precepted with me.  Yesenia Harman MD [facility]

## 2020-04-06 NOTE — Assessment & Plan Note (Signed)
Unclear etiology as she has a negative squeeze test and no pain at the metatarsal heads. She has pain in between the metatarsals suggesting Morton's neuroma but her pain symptoms do not match up well with a neuroma. Could be metatarsal bursitis as I did see a collection of fluid where she was tender. - Cont conservative management. Fit her with sports insoles with small metatarsal pads today. - Meloxicam 15mg  once daily for 15 days, then as needed - F/u in 4 weeks

## 2020-04-06 NOTE — Patient Instructions (Signed)
It was great to see you today! Thank you for letting me participate in your care!  Today, we discussed your left foot pain that has been going on for a long time. I think it is most likely pain and inflammation of the metatarsal bones of your foot. It could be a Morton's neuroma but either way I gave you special insoles for your feet with a metatarsal pad. I also gave you Meloxicam 15mg  to use once daily for two weeks and then you can use as needed. I want you to come back in 4 weeks so I can see if this has helped.  Be well, , DO PGY-4, Sports Medicine Fellow [facility]

## 2020-04-07 ENCOUNTER — Ambulatory Visit: Payer: Medicaid Other | Admitting: Family Medicine

## 2020-04-07 NOTE — Addendum Note (Signed)
Addended by: Norton Blizzard R on: 04/07/2020 11:05 AM   Modules accepted: Level of Service

## 2020-04-28 ENCOUNTER — Ambulatory Visit (INDEPENDENT_AMBULATORY_CARE_PROVIDER_SITE_OTHER): Payer: Medicaid Other | Admitting: Family Medicine

## 2020-04-28 ENCOUNTER — Other Ambulatory Visit: Payer: Self-pay

## 2020-04-28 ENCOUNTER — Encounter: Payer: Self-pay | Admitting: Family Medicine

## 2020-04-28 ENCOUNTER — Ambulatory Visit: Payer: Medicaid Other | Admitting: Family Medicine

## 2020-04-28 VITALS — BP 120/75 | HR 78 | Ht 66.73 in | Wt 184.2 lb

## 2020-04-28 DIAGNOSIS — B373 Candidiasis of vulva and vagina: Secondary | ICD-10-CM | POA: Diagnosis not present

## 2020-04-28 DIAGNOSIS — A6000 Herpesviral infection of urogenital system, unspecified: Secondary | ICD-10-CM | POA: Diagnosis not present

## 2020-04-28 DIAGNOSIS — N898 Other specified noninflammatory disorders of vagina: Secondary | ICD-10-CM | POA: Diagnosis not present

## 2020-04-28 DIAGNOSIS — Z3042 Encounter for surveillance of injectable contraceptive: Secondary | ICD-10-CM | POA: Diagnosis present

## 2020-04-28 DIAGNOSIS — B3731 Acute candidiasis of vulva and vagina: Secondary | ICD-10-CM

## 2020-04-28 DIAGNOSIS — Z803 Family history of malignant neoplasm of breast: Secondary | ICD-10-CM | POA: Diagnosis not present

## 2020-04-28 DIAGNOSIS — Z23 Encounter for immunization: Secondary | ICD-10-CM | POA: Diagnosis not present

## 2020-04-28 DIAGNOSIS — Z309 Encounter for contraceptive management, unspecified: Secondary | ICD-10-CM | POA: Insufficient documentation

## 2020-04-28 LAB — POCT WET PREP (WET MOUNT)
Clue Cells Wet Prep Whiff POC: NEGATIVE
Trichomonas Wet Prep HPF POC: ABSENT

## 2020-04-28 LAB — POCT URINE PREGNANCY: Preg Test, Ur: NEGATIVE

## 2020-04-28 MED ORDER — FLUCONAZOLE 150 MG PO TABS: 150.0000 mg | ORAL_TABLET | ORAL | 0 refills | Status: DC

## 2020-04-28 MED ORDER — MEDROXYPROGESTERONE ACETATE 150 MG/ML IM SUSY
150.0000 mg | PREFILLED_SYRINGE | Freq: Once | INTRAMUSCULAR | Status: AC
  Administered 2020-04-28: 150 mg via INTRAMUSCULAR

## 2020-04-28 MED ORDER — VALACYCLOVIR HCL 500 MG PO TABS: 500.0000 mg | ORAL_TABLET | Freq: Two times a day (BID) | ORAL | 3 refills | Status: AC

## 2020-04-28 NOTE — Progress Notes (Signed)
Patient presents for contraception and was administered Urine Pregnancy test.  Test was negative .  Depo- Provera given in RUOQ.  Patient tolerated it well.  Given reminder card for next injection.  07/14/2020-07/28/2020.  Glennie Hawk, CMA

## 2020-04-28 NOTE — Assessment & Plan Note (Signed)
Presentation wet prep consistent with yeast vaginitis.  Rx Diflucan X2.

## 2020-04-28 NOTE — Assessment & Plan Note (Signed)
Provided reassurance with recent mammogram, recommended annual mammogram given family history.  Through review of her family history, appears that she has a second-degree relatives with history of breast cancer after the age of 26, this appears to be an average risk.  However, I will have her clarify ages as if earlier may benefit from genetic testing.

## 2020-04-28 NOTE — Assessment & Plan Note (Signed)
Discussed available options, ultimately decided to proceed with the Depo injection today while she considers retrying IUD/Nexplanon placement within the next 3 months.  Encouraged against long-term Depo injection use given association with poor bone density and weight gain.  UPT negative while using reliable contraception method.  Provided resources for further research on implantable contraception.

## 2020-04-28 NOTE — Assessment & Plan Note (Signed)
Painful "Paper cut" appearing like lesion present within vulva.  While it is not extremely confirmatory given atypical presentation, given minimal risk of HSV treatment and previous history will proceed with short course of Valtrex to treat.

## 2020-04-28 NOTE — Patient Instructions (Addendum)
It was wonderful to see you today.  I will let you know the results of your wet prep and I will send in medication as needed.  Additionally we decided to proceed with the Depo injection today for birth control which will be good for 3 months.  Since you have been sexually active without contraception recently, it would be good to check a urine pregnancy test in the next 2-4 weeks.  I encourage you to reconsider getting an IUD or the Nexplanon placed, it would be good to get this placed within this time once we can reliably be sure you are not pregnant.  Otherwise, please make sure you follow-up with Dr. Pollie MeyerMcIntyre for your chronic concerns.  Etonogestrel implant What is this medicine? ETONOGESTREL (et oh noe JES trel) is a contraceptive (birth control) device. It is used to prevent pregnancy. It can be used for up to 3 years. This medicine may be used for other purposes; ask your health care provider or pharmacist if you have questions. COMMON BRAND NAME(S): Implanon, Nexplanon What should I tell my health care provider before I take this medicine? They need to know if you have any of these conditions:  abnormal vaginal bleeding  blood vessel disease or blood clots  breast, cervical, endometrial, ovarian, liver, or uterine cancer  diabetes  gallbladder disease  heart disease or recent heart attack  high blood pressure  high cholesterol or triglycerides  kidney disease  liver disease  migraine headaches  seizures  stroke  tobacco smoker  an unusual or allergic reaction to etonogestrel, anesthetics or antiseptics, other medicines, foods, dyes, or preservatives  pregnant or trying to get pregnant  breast-feeding How should I use this medicine? This device is inserted just under the skin on the inner side of your upper arm by a health care professional. Talk to your pediatrician regarding the use of this medicine in children. Special care may be needed. Overdosage: If you  think you have taken too much of this medicine contact a poison control center or emergency room at once. NOTE: This medicine is only for you. Do not share this medicine with others. What if I miss a dose? This does not apply. What may interact with this medicine? Do not take this medicine with any of the following medications:  amprenavir  fosamprenavir This medicine may also interact with the following medications:  acitretin  aprepitant  armodafinil  bexarotene  bosentan  carbamazepine  certain medicines for fungal infections like fluconazole, ketoconazole, itraconazole and voriconazole  certain medicines to treat hepatitis, HIV or AIDS  cyclosporine  felbamate  griseofulvin  lamotrigine  modafinil  oxcarbazepine  phenobarbital  phenytoin  primidone  rifabutin  rifampin  rifapentine  St. John's wort  topiramate This list may not describe all possible interactions. Give your health care provider a list of all the medicines, herbs, non-prescription drugs, or dietary supplements you use. Also tell them if you smoke, drink alcohol, or use illegal drugs. Some items may interact with your medicine. What should I watch for while using this medicine? This product does not protect you against HIV infection (AIDS) or other sexually transmitted diseases. You should be able to feel the implant by pressing your fingertips over the skin where it was inserted. Contact your doctor if you cannot feel the implant, and use a non-hormonal birth control method (such as condoms) until your doctor confirms that the implant is in place. Contact your doctor if you think that the implant may have broken  or become bent while in your arm. You will receive a user card from your health care provider after the implant is inserted. The card is a record of the location of the implant in your upper arm and when it should be removed. Keep this card with your health records. What side  effects may I notice from receiving this medicine? Side effects that you should report to your doctor or health care professional as soon as possible:  allergic reactions like skin rash, itching or hives, swelling of the face, lips, or tongue  breast lumps, breast tissue changes, or discharge  breathing problems  changes in emotions or moods  coughing up blood  if you feel that the implant may have broken or bent while in your arm  high blood pressure  pain, irritation, swelling, or bruising at the insertion site  scar at site of insertion  signs of infection at the insertion site such as fever, and skin redness, pain or discharge  signs and symptoms of a blood clot such as breathing problems; changes in vision; chest pain; severe, sudden headache; pain, swelling, warmth in the leg; trouble speaking; sudden numbness or weakness of the face, arm or leg  signs and symptoms of liver injury like dark yellow or brown urine; general ill feeling or flu-like symptoms; light-colored stools; loss of appetite; nausea; right upper belly pain; unusually weak or tired; yellowing of the eyes or skin  unusual vaginal bleeding, discharge Side effects that usually do not require medical attention (report to your doctor or health care professional if they continue or are bothersome):  acne  breast pain or tenderness  headache  irregular menstrual bleeding  nausea This list may not describe all possible side effects. Call your doctor for medical advice about side effects. You may report side effects to FDA at 1-800-FDA-1088. Where should I keep my medicine? This drug is given in a hospital or clinic and will not be stored at home. NOTE: This sheet is a summary. It may not cover all possible information. If you have questions about this medicine, talk to your doctor, pharmacist, or health care provider.  2020 Elsevier/Gold Standard (2019-04-21 11:33:04)  Intrauterine Device Information An  intrauterine device (IUD) is a medical device that is inserted in the uterus to prevent pregnancy. It is a small, T-shaped device that has one or two nylon strings hanging down from it. The strings hang out of the lower part of the uterus (cervix) to allow for future IUD removal. There are two types of IUDs available:  Hormone IUD. This type of IUD is made of plastic and contains the hormone progestin (synthetic progesterone). A hormone IUD may last 3-5 years.  Copper IUD. This type of IUD has copper wire wrapped around it. A copper IUD may last up to 10 years. How is an IUD inserted? An IUD is inserted through the vagina and placed into the uterus with a minor medical procedure. The exact procedure for IUD insertion may vary among health care providers and hospitals. How does an IUD work? Synthetic progesterone in a hormonal IUD prevents pregnancy by:  Thickening cervical mucus to prevent sperm from entering the uterus.  Thinning the uterine lining to prevent a fertilized egg from being implanted there. Copper in a copper IUD prevents pregnancy by making the uterus and fallopian tubes produce a fluid that kills sperm. What are the advantages of an IUD? Advantages of either type of IUD  It is highly effective in preventing pregnancy.  It is reversible. You can become pregnant shortly after the IUD is removed.  It is low-maintenance and can stay in place for a long time.  There are no estrogen-related side effects.  It can be used when breastfeeding.  It is not associated with weight gain.  It can be inserted right after childbirth, an abortion, or a miscarriage. Advantages of a hormone IUD  If it is inserted within 7 days of your period starting, it works right after it is inserted. If the hormone IUD is inserted at any other time in your cycle, you will need to use a backup method of birth control for 7 days after insertion.  It can make menstrual periods lighter.  It can reduce  menstrual cramping.  It can be used for 3-5 years. Advantages of a copper IUD  It works right after it is inserted.  It can be used as a form of emergency birth control if it is inserted within 5 days after having unprotected sex.  It does not interfere with your body's natural hormones.  It can be used for 10 years. What are the disadvantages of an IUD?  An IUD may cause irregular menstrual bleeding for a period of time after insertion.  You may have pain during insertion and have cramping and vaginal bleeding after insertion.  An IUD may cut the uterus (uterine perforation) when it is inserted. This is rare.  An IUD may cause pelvic inflammatory disease (PID), which is an infection in the uterus and fallopian tubes. This is rare, and it usually happens during the first 20 days after the IUD is inserted.  A copper IUD can make your menstrual flow heavier and more painful. How is an IUD removed?  You will lie on your back with your knees bent and your feet in footrests (stirrups).  A device will be inserted into your vagina to spread apart the vaginal walls (speculum). This will allow your health care provider to see the strings attached to the IUD.  Your health care provider will use a small instrument (forceps) to grasp the IUD strings and pull firmly until the IUD is removed. You may have some discomfort when the IUD is removed. Your health care provider may recommend taking over-the-counter pain relievers, such as ibuprofen, before the procedure. You may also have minor spotting for a few days after the procedure. The exact procedure for IUD removal may vary among health care providers and hospitals. Is the IUD right for me? Your health care provider will make sure you are a good candidate for an IUD and will discuss the advantages, disadvantages, and possible side effects with you. Summary  An intrauterine device (IUD) is a medical device that is inserted in the uterus to  prevent pregnancy. It is a small, T-shaped device that has one or two nylon strings hanging down from it.  A hormone IUD contains the hormone progestin (synthetic progesterone). A copper IUD has copper wire wrapped around it.  Synthetic progesterone in a hormone IUD prevents pregnancy by thickening cervical mucus and thinning the walls of the uterus. Copper in a copper IUD prevents pregnancy by making the uterus and fallopian tubes produce a fluid that kills sperm.  A hormone IUD can be left in place for 3-5 years. A copper IUD can be left in place for up to 10 years.  An IUD is inserted and removed by a health care provider. You may feel some pain during insertion and removal. Your health care  provider may recommend taking over-the-counter pain medicine, such as ibuprofen, before an IUD procedure. This information is not intended to replace advice given to you by your health care provider. Make sure you discuss any questions you have with your health care provider. Document Revised: 06/21/2017 Document Reviewed: 08/07/2016 Elsevier Patient Education  2020 [employer].

## 2020-04-28 NOTE — Progress Notes (Signed)
    SUBJECTIVE:   CHIEF COMPLAINT / HPI: Checkup/discuss birth control  Ms. Knezevic is a 44 year old G5 P20 female presenting discussed the following:  Birth control: Would like to start something. She had an IUD placed during c-section, came out shortly afterwards.  She was having cramping and the strings were stabbing in her vaginal area.  Had depo around 3.5 months ago and previously did depo for her contraception. Last sexually active 1 week ago, has been using condoms. Started having vaginal bleeding/suspected cycle last week, lasted few days. Bottle feeding son who is now 25 months old.   Vaginal discharge: 1 week history of vaginal itching/irritation.  Noticed a change in her odor.  Seemed to be worse after she is sexually active.  Denies any associated dysuria, abdominal pain, fever, back pain.  Dense breasts: She recently had a normal mammogram in 12/2019, however she was told to discuss with her provider in regards to her dense breasts that may miss small lesions.  She has a family history of breast cancer: maternal grandmother had breast cancer twice (first when she was in her 36-80's and recently two years ago), maternal great aunt (in 45's she believes). Her aunts and mother have no history of breast cancer.   PERTINENT  PMH / PSH: Allergic rhinitis, history of genital herpes, mood disorder, history of substance abuse, family history of breast cancer  OBJECTIVE:   BP 120/75   Pulse 78   Ht 5' 6.73" (1.695 m)   Wt 184 lb 3.2 oz (83.6 kg)   SpO2 99%   BMI 29.08 kg/m   General: Alert, NAD Lungs: No increased WOB  Msk: Moves all extremities spontaneously  Ext: Warm, dry, 2+ distal pulses Pelvic exam: VULVA: vulvar lesion present at 3 o'clock position--vertical linear superficial mucosal abrasion which is sensitive/tender to palpation, VAGINA: vaginal discharge - white and creamy, CERVIX: normal appearing cervix without discharge or lesions.  ASSESSMENT/PLAN:   Encounter for  contraceptive management Discussed available options, ultimately decided to proceed with the Depo injection today while she considers retrying IUD/Nexplanon placement within the next 3 months.  Encouraged against long-term Depo injection use given association with poor bone density and weight gain.  UPT negative while using reliable contraception method.  Provided resources for further research on implantable contraception.   Family history of breast cancer Provided reassurance with recent mammogram, recommended annual mammogram given family history.  Through review of her family history, appears that she has a second-degree relatives with history of breast cancer after the age of 88, this appears to be an average risk.  However, I will have her clarify ages as if earlier may benefit from genetic testing.  Genital herpes Painful "Paper cut" appearing like lesion present within vulva.  While it is not extremely confirmatory given atypical presentation, given minimal risk of HSV treatment and previous history will proceed with short course of Valtrex to treat.  Yeast vaginitis Presentation wet prep consistent with yeast vaginitis.  Rx Diflucan X2.    Follow-up for IUD/Nexplanon if decides to proceed with this contraception, otherwise follow-up for routine visits with Dr. Pollie Meyer.  Allayne Stack, DO [facility]

## 2020-05-04 ENCOUNTER — Ambulatory Visit (INDEPENDENT_AMBULATORY_CARE_PROVIDER_SITE_OTHER): Payer: Medicaid Other | Admitting: Family Medicine

## 2020-05-04 ENCOUNTER — Ambulatory Visit
Admission: RE | Admit: 2020-05-04 | Discharge: 2020-05-04 | Disposition: A | Discharge status: Home / Self Care | Payer: Medicaid Other | Source: Ambulatory Visit | Attending: Family Medicine | Admitting: Family Medicine

## 2020-05-04 ENCOUNTER — Other Ambulatory Visit: Payer: Self-pay

## 2020-05-04 VITALS — BP 112/78 | Ht 66.0 in | Wt 184.0 lb

## 2020-05-04 DIAGNOSIS — M79672 Pain in left foot: Secondary | ICD-10-CM

## 2020-05-04 MED ORDER — MELOXICAM 15 MG PO TABS
ORAL_TABLET | ORAL | 0 refills | Status: DC
Start: 2020-05-04 — End: 2020-07-27

## 2020-05-04 NOTE — Patient Instructions (Signed)
It was great to see you today! Thank you for letting me participate in your care!  Today, we discussed your left pain and it is unclear what the cause of the pain is. However, I will order x-rays today and call you with results. If the x-rays are normal we will order an MRI. In the meantime, please continue wearing the insoles and using the Meloxiam with food as needed.  Be well, Jules Schick, DO PGY-4, Sports Medicine Fellow [facility]

## 2020-05-04 NOTE — Progress Notes (Addendum)
    SUBJECTIVE:   CHIEF COMPLAINT / HPI:   F/u for Left Foot Pain Yesenia Weeks is a very pleasant 44 year old female who presents today for follow-up for continued left foot pain.  Originally this was thought to be due to metatarsalgia of the left foot however she is not responding to conservative therapy.  She states she has been wearing her insoles with metatarsal pads however she was in a rush and forgot to bring them with her today.  She continues to have pain in her left foot which is nonspecific today however she states it is now starting to spread to her left ankle.  She takes the meloxicam and states that it helps for a short period of time but does not last and the pain continues to return daily.  She continues to have pain with activity and weightbearing and rest tends to help.  PERTINENT  PMH / PSH: Hallux valgus  OBJECTIVE:   BP 112/78   Ht 5\' 6"  (1.676 m)   Wt 184 lb (83.5 kg)   BMI 29.70 kg/m   No flowsheet data found.  Ankle/Foot, Left: No visible erythema, swelling, ecchymosis, or bony deformity. No notable pes planus deformity.  Hallux valgus of great toe.  Transverse arch grossly intact; No evidence of tibiotalar deviation; Range of motion is full in all directions. Strength is 5/5 in all directions. Stable lateral and medial ligaments; Talar dome nontender; Unremarkable calcaneal squeeze; No plantar calcaneal tenderness;  Able to walk 4 steps.   ASSESSMENT/PLAN:   Left foot pain Unclear etiology as to the source of her foot pain given this has been ongoing for months and she is not responding to conservative treatment I will proceed with x-rays of the foot and ankle because the pain is spreading to the ankle and she will get these today.  If the x-rays are normal we will proceed with an MRI of the left foot.  I will have her follow-up after the MRI has been performed if it is indeed needed.  Meloxicam refill given today quantity of 30 to be used as needed to be taken with  food.  Of asked her to continue wearing the insoles with metatarsal pads.     , DO PGY-4, Sports Medicine Fellow [facility]  Addendum:  Patient seen in the office by fellow.  His history, exam, plan of care were precepted with me.  [facility] MD Norton Blizzard

## 2020-05-04 NOTE — Assessment & Plan Note (Addendum)
Unclear etiology as to the source of her foot pain given this has been ongoing for months and she is not responding to conservative treatment I will proceed with x-rays of the foot and ankle because the pain is spreading to the ankle and she will get these today.  If the x-rays are normal we will proceed with an MRI of the left foot.  I will have her follow-up after the MRI has been performed if it is indeed needed.  Meloxicam refill given today quantity of 30 to be used as needed to be taken with food.  Of asked her to continue wearing the insoles with metatarsal pads.

## 2020-05-06 ENCOUNTER — Other Ambulatory Visit: Payer: Self-pay

## 2020-05-06 DIAGNOSIS — M79672 Pain in left foot: Secondary | ICD-10-CM

## 2020-05-27 ENCOUNTER — Other Ambulatory Visit: Payer: Medicaid Other

## 2020-06-06 ENCOUNTER — Other Ambulatory Visit: Payer: Self-pay | Admitting: Family Medicine

## 2020-06-06 DIAGNOSIS — B3731 Acute candidiasis of vulva and vagina: Secondary | ICD-10-CM

## 2020-06-06 DIAGNOSIS — B373 Candidiasis of vulva and vagina: Secondary | ICD-10-CM

## 2020-06-11 ENCOUNTER — Other Ambulatory Visit: Payer: Self-pay | Admitting: Family Medicine

## 2020-06-11 DIAGNOSIS — B373 Candidiasis of vulva and vagina: Secondary | ICD-10-CM

## 2020-06-11 DIAGNOSIS — B3731 Acute candidiasis of vulva and vagina: Secondary | ICD-10-CM

## 2020-06-15 ENCOUNTER — Ambulatory Visit
Admission: RE | Admit: 2020-06-15 | Discharge: 2020-06-15 | Disposition: A | Discharge status: Home / Self Care | Payer: Medicaid Other | Source: Ambulatory Visit | Attending: Family Medicine | Admitting: Family Medicine

## 2020-06-15 ENCOUNTER — Other Ambulatory Visit: Payer: Self-pay

## 2020-06-15 DIAGNOSIS — M79672 Pain in left foot: Secondary | ICD-10-CM

## 2020-07-11 IMAGING — MG DIGITAL SCREENING BILAT W/ TOMO W/ CAD
6 series · 6 of 18 positions shown · non-contrast
Comparison: Previous exam(s).

CLINICAL DATA: Screening.

EXAM:
DIGITAL SCREENING BILATERAL MAMMOGRAM WITH TOMO AND CAD

[L MLO synth-2D]
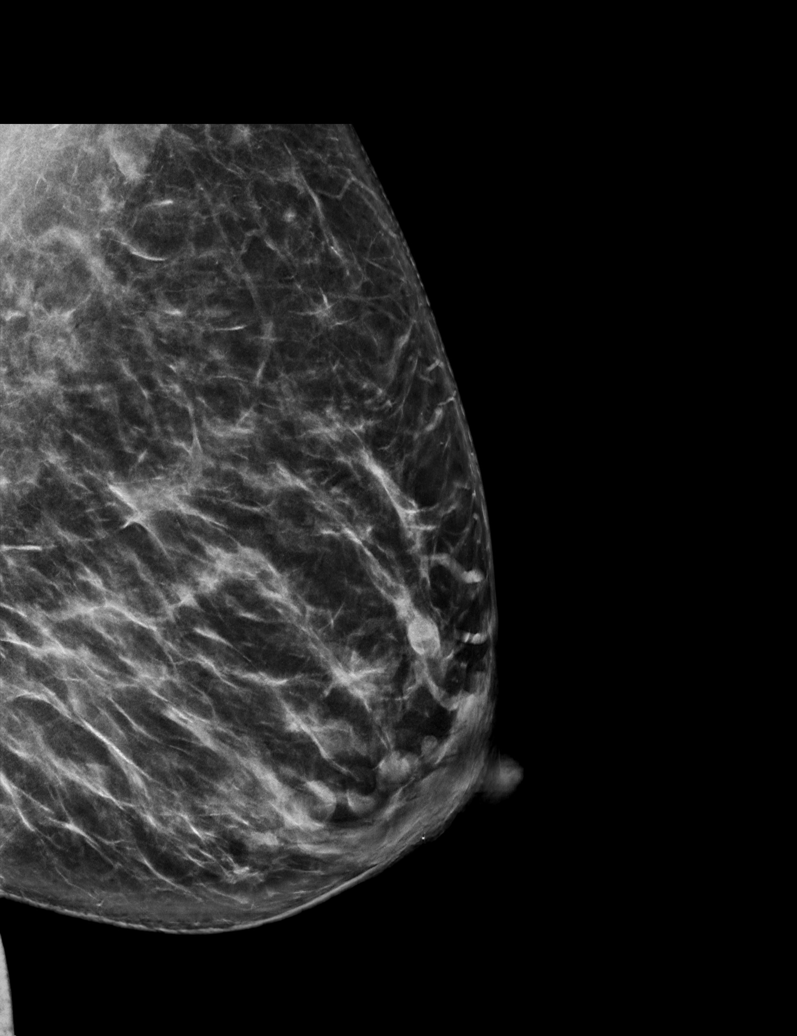

[L CC synth-2D]
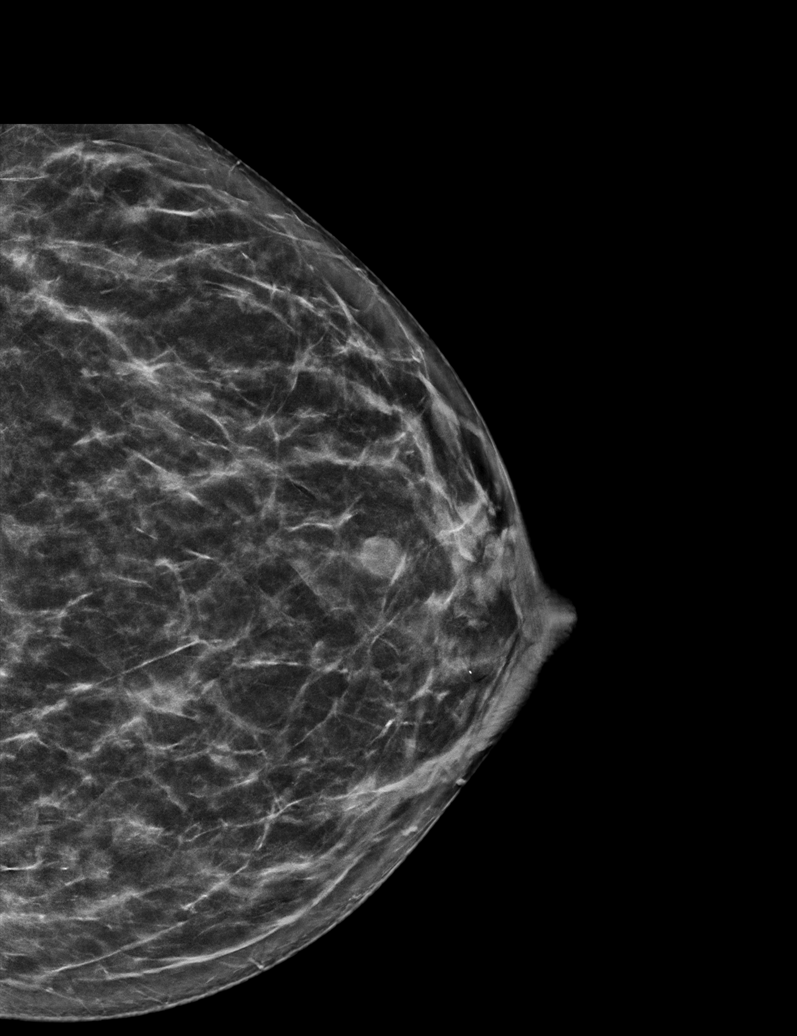

[R CC synth-2D]
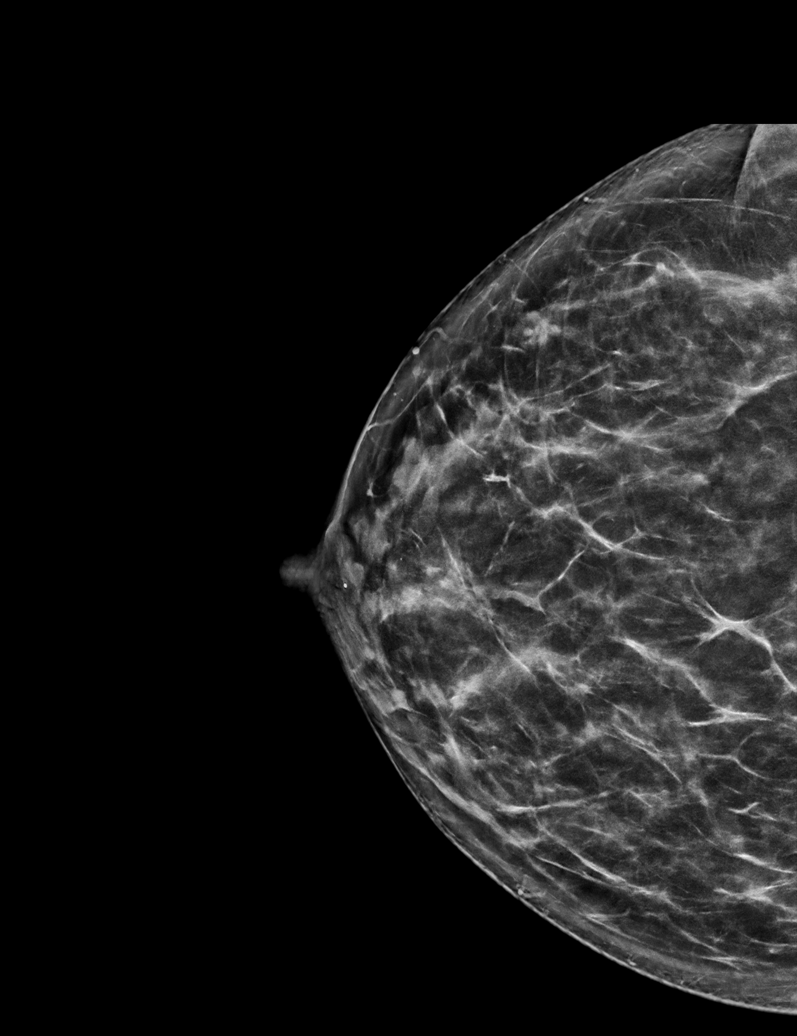

[R CC tomo · tomo slice 35/70.0]
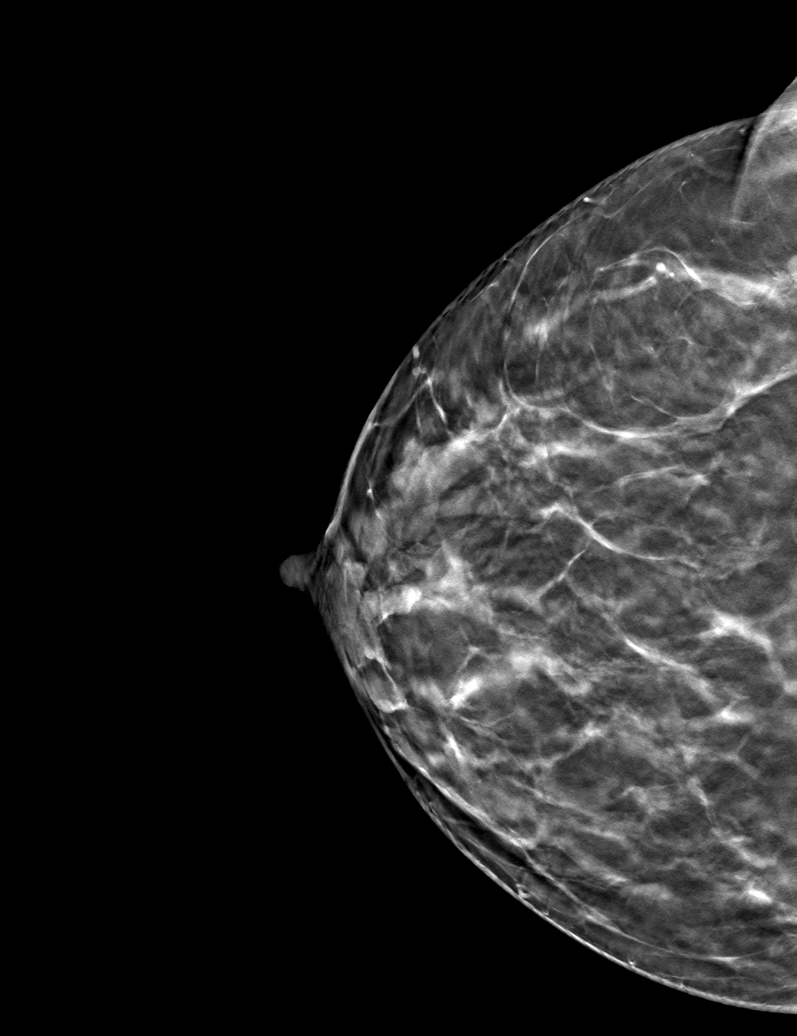

[L MLO tomo · tomo slice 37/74.0]
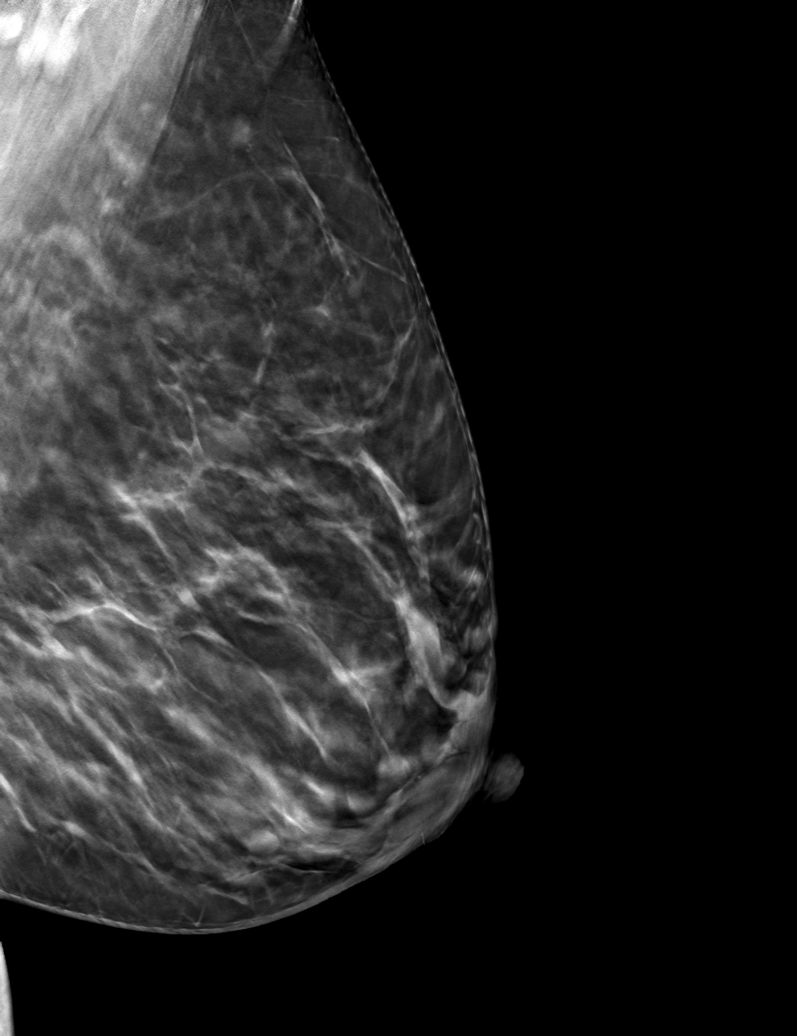

[L CC tomo · tomo slice 31/62.0]
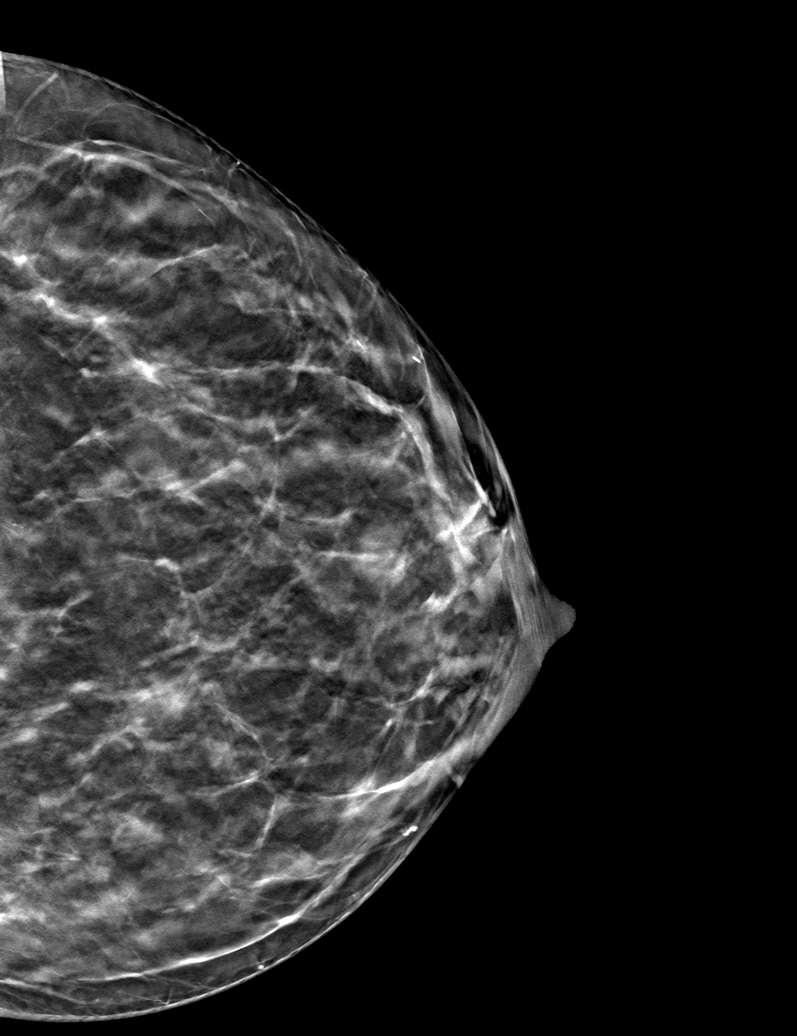

[6 of 18 positions shown; findings below may reference images not displayed]

ACR Breast Density Category c: The breast tissue is heterogeneously
dense, which may obscure small masses.
FINDINGS: There are no findings suspicious for malignancy. Images were
processed with CAD.
IMPRESSION: No mammographic evidence of malignancy. A result letter of this
screening mammogram will be mailed directly to the patient.

RECOMMENDATION:
Screening mammogram in one year. (Code:[id number])

BI-RADS CATEGORY  1: Negative.

## 2020-07-13 ENCOUNTER — Ambulatory Visit: Payer: Medicaid Other | Admitting: Family Medicine

## 2020-07-14 ENCOUNTER — Ambulatory Visit: Payer: Medicaid Other

## 2020-07-27 ENCOUNTER — Ambulatory Visit: Payer: Medicaid Other | Admitting: Family Medicine

## 2020-07-27 ENCOUNTER — Other Ambulatory Visit: Payer: Self-pay

## 2020-07-27 VITALS — BP 116/82 | Ht 66.0 in

## 2020-07-27 DIAGNOSIS — M79672 Pain in left foot: Secondary | ICD-10-CM | POA: Diagnosis not present

## 2020-07-27 MED ORDER — MELOXICAM 15 MG PO TABS
ORAL_TABLET | ORAL | 0 refills | Status: DC
Start: 2020-07-27 — End: 2021-12-28

## 2020-07-27 NOTE — Progress Notes (Addendum)
    SUBJECTIVE:   CHIEF COMPLAINT / HPI:   Left Foot MRI F/u Rhemi Bartleson is a very pleasant 44y/o female who presents for continued left foot pain. She had an MRI which was normal. It showed no soft tissue swelling, no fractures, no tendinopathy, no joint effusion. She does have hallux valgus deformity. No soft tissue masses.  Unfortunately, she is still having pain that is not getting worse but is largely unchanged from her previous visit. It is located dorsally over the forefoot mostly over the 1st-3rd metatarsals. It hurts intermittently both when active but also sometimes when she is at rest.   PERTINENT  PMH / PSH: Iron deficiency anemia  OBJECTIVE:   BP 116/82   Ht 5\' 6"  (1.676 m)   BMI 29.70 kg/m   .No flowsheet data found.  Ankle/Foot, Left: No visible erythema, swelling, ecchymosis, or bony deformity. Range of motion is full in all directions. Strength is 5/5 in all directions. TTP at the shaft of the 1st through 3rd metatarsals. No other tenderness to palpation. Positive talar tilt test but symmetric with the right. Unremarkable squeeze and kleiger tests; Talar dome nontender; Able to walk 4 steps.    ASSESSMENT/PLAN:   Left foot pain Etiology of pain still unclear but with negative MRI I am more confident that this will improve with time and exercises. - Formal PT - Meloxicam refill for 30 days - F/u in 6 weeks     , DO PGY-4, Sports Medicine Fellow [facility]  Addendum:  Patient seen in the office by fellow.  His history, exam, plan of care were precepted with me.  [facility] MD Norton Blizzard

## 2020-07-27 NOTE — Patient Instructions (Signed)
It was great to see you today! Thank you for letting me participate in your care!  Today, we discussed your continued foot pain and I am sorry it is still bothering you. I think 4-6 weeks of formal physical therapy will help and I will see you back after that. I have refilled your meloxicam as well.  Be well, Jules Schick, DO PGY-4, Sports Medicine Fellow [facility]

## 2020-07-27 NOTE — Assessment & Plan Note (Signed)
Etiology of pain still unclear but with negative MRI I am more confident that this will improve with time and exercises. - Formal PT - Meloxicam refill for 30 days - F/u in 6 weeks

## 2020-07-29 ENCOUNTER — Other Ambulatory Visit: Payer: Self-pay

## 2020-07-29 ENCOUNTER — Ambulatory Visit (INDEPENDENT_AMBULATORY_CARE_PROVIDER_SITE_OTHER): Payer: Medicaid Other

## 2020-07-29 DIAGNOSIS — Z3042 Encounter for surveillance of injectable contraceptive: Secondary | ICD-10-CM

## 2020-07-29 LAB — POCT URINE PREGNANCY: Preg Test, Ur: NEGATIVE

## 2020-07-29 MED ORDER — MEDROXYPROGESTERONE ACETATE 150 MG/ML IM SUSP
150.0000 mg | Freq: Once | INTRAMUSCULAR | Status: AC
  Administered 2020-07-29: 150 mg via INTRAMUSCULAR

## 2020-07-29 NOTE — Progress Notes (Signed)
Patient here today for Depo Provera injection and is within her dates.    Last contraceptive appt was 04/2020.  Depo given in LUOQ today. Site unremarkable & patient tolerated injection.    Next injection due 09/22/2020-10/28/2020. Reminder card given.

## 2020-08-09 ENCOUNTER — Encounter: Payer: Medicaid Other | Admitting: Family Medicine

## 2020-08-23 ENCOUNTER — Encounter: Payer: Medicaid Other | Admitting: Family Medicine

## 2020-08-23 ENCOUNTER — Telehealth: Payer: Self-pay

## 2020-09-01 ENCOUNTER — Encounter: Payer: Self-pay | Admitting: Family Medicine

## 2020-09-01 ENCOUNTER — Other Ambulatory Visit: Payer: Self-pay

## 2020-09-01 ENCOUNTER — Ambulatory Visit (INDEPENDENT_AMBULATORY_CARE_PROVIDER_SITE_OTHER): Payer: Medicaid Other

## 2020-09-01 ENCOUNTER — Ambulatory Visit (INDEPENDENT_AMBULATORY_CARE_PROVIDER_SITE_OTHER): Payer: Medicaid Other | Admitting: Family Medicine

## 2020-09-01 VITALS — BP 102/68 | HR 77 | Wt 179.2 lb

## 2020-09-01 DIAGNOSIS — E663 Overweight: Secondary | ICD-10-CM

## 2020-09-01 DIAGNOSIS — Z862 Personal history of diseases of the blood and blood-forming organs and certain disorders involving the immune mechanism: Secondary | ICD-10-CM

## 2020-09-01 DIAGNOSIS — F411 Generalized anxiety disorder: Secondary | ICD-10-CM

## 2020-09-01 DIAGNOSIS — Z803 Family history of malignant neoplasm of breast: Secondary | ICD-10-CM | POA: Diagnosis not present

## 2020-09-01 DIAGNOSIS — Z23 Encounter for immunization: Secondary | ICD-10-CM

## 2020-09-01 DIAGNOSIS — Z3042 Encounter for surveillance of injectable contraceptive: Secondary | ICD-10-CM

## 2020-09-01 DIAGNOSIS — D509 Iron deficiency anemia, unspecified: Secondary | ICD-10-CM

## 2020-09-01 DIAGNOSIS — M79672 Pain in left foot: Secondary | ICD-10-CM

## 2020-09-01 NOTE — Progress Notes (Signed)
  Date of Visit: 09/01/2020   SUBJECTIVE:   HPI:  Yesenia Weeks presents today for a well woman exam.   Concerns today: see below Periods: none, on depo Contraception: depo Pelvic symptoms: none Sexual activity: rarely STD Screening: declines today Pap smear status: UTD Exercise: not much Smoking: occasional Alcohol: twice a month Drugs: recently smoked marijuana once Mood: see below Cancers in family: strong family history of breast cancer (Grandmother bilateral, cousins, aunt )  Foot pain - saw sports medicine, had MRI, requests referral to podiatry  Stress - increased stress/frustration lately. 3 yo son was recently diagnosed with autism. Also has a 75 month old. PHQ score 5 today. Denies SI/HI. Thinks it would be helpful to have therapist but needs help connecting with one.  OBJECTIVE:   BP 102/68   Pulse 77   Wt 179 lb 3.2 oz (81.3 kg)   SpO2 98%   BMI 28.92 kg/m  Gen: NAD, pleasant, cooperative HEENT: NCAT, PERRL, no palpable thyromegaly or anterior cervical lymphadenopathy Heart: RRR, no murmurs Lungs: CTAB, NWOB Abdomen: soft, nontender to palpation Neuro: grossly nonfocal, speech normal  ASSESSMENT/PLAN:   Health maintenance:  -STD screening: declines today -pap smear: UTD -mammogram: UTD -lipid screening: lipids today -hep C screening test today -immunizations:  . Flu: UTD . Tdap: UTD . COVID: booster today -handout given on health maintenance topics  Family history of breast cancer Refer to genetic counseling  Contraception management Doing well on depo, continue  Anxiety state PHQ score only 5 today. Patient seems to have more situational frustration than current severe depression or anxiety. Would benefit from therapy. Given list of resources to contact and set up an appointment with a therapist.  Left foot pain Requests referral to podiatry, will enter  ANEMIA, IRON DEFICIENCY, UNSPEC. Update CBC today due to history of anemia  FOLLOW  UP: Follow up in 1 year for next CPE, sooner if needed Referring to genetic counseling Referring to podiatry  [place] J. Pollie Meyer, MD [facility]

## 2020-09-01 NOTE — Progress Notes (Signed)
   Covid-19 Vaccination Clinic  Name:  Keyira Steckman    MRN: [id number] DOB: Mckena 08, 1977  09/01/2020  Ms. Krueger was observed post Covid-19 immunization for 15 minutes without incident. She was provided with Vaccine Information Sheet and instruction to access the V-Safe system.   Ms. Gheen was instructed to call 911 with any severe reactions post vaccine: Marland Kitchen Difficulty breathing  . Swelling of face and throat  . A fast heartbeat  . A bad rash all over body  . Dizziness and weakness

## 2020-09-01 NOTE — Patient Instructions (Signed)
It was great to see you again today!  See handout below on therapists Referring to genetic counselor Referring to foot specialist  Labs today  Be well, Dr. Pollie Meyer    Therapy and Counseling Resources Most providers on this list will take Medicaid. Patients with commercial insurance or Medicare should contact their insurance company to get a list of in network providers.  BestDay:Psychiatry and Counseling [address] [facility] [address] [place], [place] [address] [phone]  [facility]  [address], [address] [place], [place] [address]      [phone]  [facility] [address]  [place], [place] [address] [phone]  [facility] [address]  [place], [place] [address]       [phone]      Jovita Kussmaul [facility] [address] [place], [place] [phone]  [facility]:  [address] [place] [phone]  [facility]:  [address]iener [phone]  [facility] (under & uninsured) [address]   [place] [phone]    [email or web address]    [facility] [address] . [place]    [phone]  [facility] -[address]     Phone:  [phone]     [facility]-  [address]  [phone]   [facility] [address]      [place], [place] [phone]  [facility]: [address] [place], [place], [place]  [phone]   [facility] (Spanish therapist) [email or web address]  [address] [place]  [address]   [place] [address]    [phone]    [facility]   [address],  [place], [place]  [phone]   [facility]  [address] [place]  [phone]  [facility]  [address] [place], [place]        [phone]  Open Access/Walk In Clinic under & uninsured  [facility]  [address] [place], [place] [phone] Crisis [phone]  [facility] [place],  (Spanish)   [address], [place]: [phone]) 8:30 - 12; 1 - 2:30  [facility] [employer],  [address] [place], [place]    [phone]):8:30 - 12; 2 - 3PM  [facility] [employer],  [address]  [place]; [phone]):   Mon - Fri 8 AM - 5 PM  [facility] [address] [place]  MWF 12:30 to 3:00 or call to schedule an appointment  [phone]  Specific Provider options Psychology Today  https://www.psychologytoday.com/us 1. click on find a therapist  2. enter your zip code 3. left side and select or tailor a therapist for your specific need.   [facility] Provider Directory [email or web address]  (Medicaid)   Follow all drop down to find a provider  Social Support program [facility] [place] [phone] or PhotoSolver.pl [address], [place], [place] Recovery support and educational   24- Hour Availability:  .  [facility]  . [address] [place], [place] [employer] [phone] Crisis [phone]  . [facility] [employer] [phone]  [facility] Service  [phone]   . [facility] [employer]  [phone] (after hours)  . Therapeutic  Alternative/Mobile Crisis   [phone]  . [place] National Suicide Hotline  [phone] (TALK)  . Call 911 or go to emergency room  . [employer]  [phone]);  [place] and [employer]   . [facility]  [phone]); [place], [place], [place], [place], [place], [place], [place]

## 2020-09-02 LAB — CMP14+EGFR
ALT: 15 IU/L (ref 0–32)
AST: 15 IU/L (ref 0–40)
Albumin/Globulin Ratio: 1.4 (ref 1.2–2.2)
Albumin: 4.6 g/dL (ref 3.8–4.8)
Alkaline Phosphatase: 112 IU/L (ref 44–121)
BUN/Creatinine Ratio: 14 (ref 9–23)
BUN: 11 mg/dL (ref 6–24)
Bilirubin Total: 0.6 mg/dL (ref 0.0–1.2)
CO2: 21 mmol/L (ref 20–29)
Calcium: 9.7 mg/dL (ref 8.7–10.2)
Chloride: 103 mmol/L (ref 96–106)
Creatinine, Ser: 0.8 mg/dL (ref 0.57–1.00)
GFR calc Af Amer: 104 mL/min/{1.73_m2} (ref >59)
GFR calc non Af Amer: 91 mL/min/{1.73_m2} (ref >59)
Globulin, Total: 3.4 g/dL (ref 1.5–4.5)
Glucose: 101 mg/dL — ABNORMAL HIGH (ref 65–99)
Potassium: 4.2 mmol/L (ref 3.5–5.2)
Sodium: 140 mmol/L (ref 134–144)
Total Protein: 8 g/dL (ref 6.0–8.5)

## 2020-09-02 LAB — CBC
Hematocrit: 40.4 % (ref 34.0–46.6)
Hemoglobin: 12.9 g/dL (ref 11.1–15.9)
MCH: 26.3 pg — ABNORMAL LOW (ref 26.6–33.0)
MCHC: 31.9 g/dL (ref 31.5–35.7)
MCV: 82 fL (ref 79–97)
Platelets: 275 10*3/uL (ref 150–450)
RBC: 4.91 x10E6/uL (ref 3.77–5.28)
RDW: 12.4 % (ref 11.7–15.4)
WBC: 10.6 10*3/uL (ref 3.4–10.8)

## 2020-09-02 LAB — LIPID PANEL
Chol/HDL Ratio: 3.3 ratio (ref 0.0–4.4)
Cholesterol, Total: 168 mg/dL (ref 100–199)
HDL: 51 mg/dL (ref >39)
LDL Chol Calc (NIH): 100 mg/dL — ABNORMAL HIGH (ref 0–99)
Triglycerides: 89 mg/dL (ref 0–149)
VLDL Cholesterol Cal: 17 mg/dL (ref 5–40)

## 2020-09-02 NOTE — Assessment & Plan Note (Signed)
Requests referral to podiatry, will enter

## 2020-09-02 NOTE — Assessment & Plan Note (Signed)
Doing well on depo, continue 

## 2020-09-02 NOTE — Assessment & Plan Note (Signed)
Update CBC today due to history of anemia

## 2020-09-02 NOTE — Assessment & Plan Note (Signed)
Refer to genetic counseling. 

## 2020-09-02 NOTE — Assessment & Plan Note (Signed)
PHQ score only 5 today. Patient seems to have more situational frustration than current severe depression or anxiety. Would benefit from therapy. Given list of resources to contact and set up an appointment with a therapist.

## 2020-09-07 ENCOUNTER — Telehealth: Payer: Self-pay

## 2020-09-07 ENCOUNTER — Ambulatory Visit: Payer: Medicaid Other | Admitting: Family Medicine

## 2020-09-07 DIAGNOSIS — R7309 Other abnormal glucose: Secondary | ICD-10-CM

## 2020-09-07 NOTE — Telephone Encounter (Signed)
Patient LVM on nurse line requesting returned phone call from provider to discuss results from recent OV.

## 2020-09-07 NOTE — Telephone Encounter (Signed)
Called patient, reviewed labs Overall unremarkable except glucose of 101. Advised this is unlikely to represent diabetes but we can check an A1c to officially check status.  Patient agreeable. She will call back to schedule a lab visit (I offered to schedule it for her but she wants to wait)  Pt was appreciative of the call.   Latrelle Dodrill, MD

## 2020-10-17 ENCOUNTER — Ambulatory Visit: Payer: Medicaid Other | Admitting: Student in an Organized Health Care Education/Training Program

## 2020-10-23 NOTE — Progress Notes (Deleted)
   Subjective:   Patient ID: Yesenia Weeks    DOB: [birth year], 45 y.o. female   MRN: [id number]  Yesenia Weeks is a 45 y.o. female with a history of allergic rhinitis, oral and genital HSV, hallux valgus, mood disorder, metatarsalgia, h/o substance abuse, h/o IUFD, f/H of breast cancer, depression, anxiety, anemia  here for vaginal irritation.  Vaginal Discharge: Complains of {pe vag discharge desc:315065} vaginal discharge for *** {gen duration:315003}. Denies abnormal vaginal bleeding, significant pelvic pain or fever. No UTI symptoms. Sexually active, does not use condoms, sexually active with *** female/female partner.  Last unprotected intercourse __ days ago.  Denies history of known exposure to STD or symptoms in partner/Interested in STD testing***.  No LMP recorded.  No history of STD's.  No abdominal pain, nausea, or vomiting.  No fevers or chills.     Review of Systems  See HPI above for review of systems.    Objective:    There were no vitals taken for this visit.  Gen:  She appears well, afebrile.  Abdomen: benign, soft, nontender, no masses.  GYN:  External genitalia within normal limits.  Vaginal mucosa pink, moist, normal rugae.  Nonfriable cervix without lesions, *** discharge, *** bleeding noted on speculum exam.  Bimanual exam revealed normal, nongravid uterus.  No cervical motion tenderness. No adnexal masses bilaterally.   Exam Chaperoned by:    Assessment & Plan:   No problem-specific Assessment & Plan notes found for this encounter.   No orders of the defined types were placed in this encounter.  Had elevated blood sugar on last BMP. A1C obtained today to evaluate per PCP's recs.  Vaginal Discharge: Wet prep notable for ***. GC/Cl, HIV, RPR, Hep C pending.  - Will treat with *** - Treatment with Flagyl 500 BID x 7 days. Diflucan x1 s/p metronidazole  - F/U if symptoms not improving or getting worse.  - Self care instructions given - F/U with PCP as needed.   - Return precautions including abdominal pain, fever, chills, nausea, or vomiting given.    Orpah Cobb, DO [facility], PGY3 10/23/2020 10:33 AM   {    This will disappear when note is signed, click to select method of visit    :1}

## 2020-10-24 ENCOUNTER — Ambulatory Visit: Payer: Medicaid Other | Admitting: Family Medicine

## 2020-11-03 ENCOUNTER — Ambulatory Visit: Payer: Medicaid Other | Admitting: Family Medicine

## 2020-11-07 ENCOUNTER — Ambulatory Visit: Payer: Medicaid Other

## 2020-11-09 ENCOUNTER — Ambulatory Visit (INDEPENDENT_AMBULATORY_CARE_PROVIDER_SITE_OTHER): Payer: Medicaid Other

## 2020-11-09 ENCOUNTER — Other Ambulatory Visit: Payer: Self-pay

## 2020-11-09 DIAGNOSIS — Z3042 Encounter for surveillance of injectable contraceptive: Secondary | ICD-10-CM | POA: Diagnosis present

## 2020-11-09 LAB — POCT URINE PREGNANCY: Preg Test, Ur: NEGATIVE

## 2020-11-10 MED ORDER — MEDROXYPROGESTERONE ACETATE 150 MG/ML IM SUSP
150.0000 mg | Freq: Once | INTRAMUSCULAR | Status: AC
  Administered 2020-11-09: 150 mg via INTRAMUSCULAR

## 2020-11-10 NOTE — Progress Notes (Signed)
Patient here today for Depo Provera injection and is not within her dates.    Urine pregnancy obtained and resulted negative. Scheduled patient for nurse visit in two weeks (5/4) for repeat urine pregnancy test, as patient has been sexually active in the last month. Advised patient to use condoms for at least 7 days after injection. Patient verbalizes understanding.   Last contraceptive appt was 09/01/2020  Depo given in RUOQ today.  Site unremarkable & patient tolerated injection.    Next injection due 7/6-7/20.  Reminder card given.    Veronda Prude, RN

## 2020-11-23 ENCOUNTER — Ambulatory Visit: Payer: Medicaid Other

## 2020-11-30 ENCOUNTER — Telehealth: Payer: Self-pay | Admitting: *Deleted

## 2020-11-30 DIAGNOSIS — Z803 Family history of malignant neoplasm of breast: Secondary | ICD-10-CM

## 2020-11-30 DIAGNOSIS — M79672 Pain in left foot: Secondary | ICD-10-CM

## 2020-11-30 NOTE — Telephone Encounter (Signed)
Referrals processed and sent to specialty offices.Ida Uppal,CMA

## 2020-11-30 NOTE — Telephone Encounter (Signed)
This was an oversight on my part. Referrals are now placed.  Thanks, Latrelle Dodrill, MD

## 2020-11-30 NOTE — Telephone Encounter (Signed)
Patient states that during her visit in February, her and Dr. Pollie Meyer discussed seeing podiatry and genetic counseling.  These referrals were not actually placed at that time.  Will forward to MD to place these referrals for patient. Salahuddin Arismendez,CMA

## 2020-12-01 ENCOUNTER — Telehealth: Payer: Self-pay | Admitting: Licensed Clinical Social Worker

## 2020-12-01 NOTE — Telephone Encounter (Signed)
Received a genetic counseling referral from Dr. Pollie Meyer for fhx of breast cancer. Yesenia Weeks has been cld and scheduled to see Brianna on 5/23 at 9am. Pt aware to arrive 15 minutes early.

## 2020-12-09 ENCOUNTER — Ambulatory Visit: Payer: Medicaid Other | Admitting: Podiatry

## 2020-12-12 ENCOUNTER — Inpatient Hospital Stay: Payer: Medicaid Other | Admitting: Licensed Clinical Social Worker

## 2020-12-12 ENCOUNTER — Telehealth: Payer: Self-pay | Admitting: Licensed Clinical Social Worker

## 2020-12-12 ENCOUNTER — Inpatient Hospital Stay: Payer: Medicaid Other

## 2020-12-12 ENCOUNTER — Encounter: Payer: Self-pay | Admitting: Podiatry

## 2020-12-12 NOTE — Telephone Encounter (Signed)
Pt cld to r/s her genetic counseling appt on 6/13 at 10am.

## 2020-12-15 ENCOUNTER — Ambulatory Visit: Payer: Medicaid Other | Admitting: Podiatry

## 2020-12-26 ENCOUNTER — Ambulatory Visit: Payer: Medicaid Other | Admitting: Podiatry

## 2020-12-27 ENCOUNTER — Ambulatory Visit: Payer: Medicaid Other

## 2020-12-28 ENCOUNTER — Ambulatory Visit: Payer: Medicaid Other | Admitting: Student in an Organized Health Care Education/Training Program

## 2020-12-30 ENCOUNTER — Ambulatory Visit: Payer: Medicaid Other

## 2021-01-02 ENCOUNTER — Encounter: Payer: Medicaid Other | Admitting: Licensed Clinical Social Worker

## 2021-01-02 ENCOUNTER — Other Ambulatory Visit: Payer: Medicaid Other

## 2021-01-04 ENCOUNTER — Ambulatory Visit: Payer: Medicaid Other | Admitting: Podiatry

## 2021-01-25 ENCOUNTER — Other Ambulatory Visit: Payer: Self-pay

## 2021-01-25 MED ORDER — CETIRIZINE HCL 10 MG PO TABS
ORAL_TABLET | ORAL | 3 refills | Status: DC
Start: 2021-01-25 — End: 2021-05-19

## 2021-01-30 ENCOUNTER — Ambulatory Visit: Payer: Medicaid Other

## 2021-02-14 ENCOUNTER — Telehealth: Payer: Self-pay

## 2021-02-14 MED ORDER — VALACYCLOVIR HCL 500 MG PO TABS: 500.0000 mg | ORAL_TABLET | Freq: Two times a day (BID) | ORAL | 3 refills | Status: AC

## 2021-02-14 NOTE — Telephone Encounter (Signed)
Patient calls nurse line requesting valtrex prescription due to recent outbreak.   Patient has scheduled for next available appointment on 02/20/21.  Please advise.   Veronda Prude, RN

## 2021-02-14 NOTE — Telephone Encounter (Signed)
Rx sent in to patient's pharmacy. Please let her know I sent this in.  Thanks Latrelle Dodrill, MD

## 2021-02-15 NOTE — Telephone Encounter (Signed)
LM for patient that script was sent to the pharmacy. Eldonna Neuenfeldt,CMA  

## 2021-02-19 NOTE — Progress Notes (Signed)
Patient had to leave due to acute problem came up with her grandparents that she needed to attend to.  Pregnancy test was negative.  She received COVID 19 shot and Depo-Provera today.  I did not see patient.  She will make appointment in future to follow-up for other concerns. Pap UTD, NILM, HPV negative, next due January 2026.

## 2021-02-20 ENCOUNTER — Other Ambulatory Visit: Payer: Self-pay | Admitting: Family Medicine

## 2021-02-20 ENCOUNTER — Other Ambulatory Visit: Payer: Self-pay

## 2021-02-20 ENCOUNTER — Ambulatory Visit (INDEPENDENT_AMBULATORY_CARE_PROVIDER_SITE_OTHER): Payer: Medicaid Other | Admitting: Family Medicine

## 2021-02-20 ENCOUNTER — Ambulatory Visit (INDEPENDENT_AMBULATORY_CARE_PROVIDER_SITE_OTHER): Payer: Medicaid Other

## 2021-02-20 VITALS — BP 139/96 | HR 74 | Ht 66.0 in | Wt 183.0 lb

## 2021-02-20 DIAGNOSIS — Z23 Encounter for immunization: Secondary | ICD-10-CM | POA: Diagnosis not present

## 2021-02-20 DIAGNOSIS — Z114 Encounter for screening for human immunodeficiency virus [HIV]: Secondary | ICD-10-CM | POA: Diagnosis not present

## 2021-02-20 DIAGNOSIS — Z309 Encounter for contraceptive management, unspecified: Secondary | ICD-10-CM | POA: Diagnosis present

## 2021-02-20 DIAGNOSIS — N898 Other specified noninflammatory disorders of vagina: Secondary | ICD-10-CM | POA: Diagnosis not present

## 2021-02-20 DIAGNOSIS — Z113 Encounter for screening for infections with a predominantly sexual mode of transmission: Secondary | ICD-10-CM

## 2021-02-20 DIAGNOSIS — Z1231 Encounter for screening mammogram for malignant neoplasm of breast: Secondary | ICD-10-CM

## 2021-02-20 LAB — POCT URINE PREGNANCY: Preg Test, Ur: NEGATIVE

## 2021-02-20 MED ORDER — MEDROXYPROGESTERONE ACETATE 150 MG/ML IM SUSP
150.0000 mg | Freq: Once | INTRAMUSCULAR | Status: AC
  Administered 2021-02-20: 150 mg via INTRAMUSCULAR

## 2021-02-20 NOTE — Addendum Note (Signed)
Addended by: Aquilla Solian on: 02/20/2021 11:54 AM   Modules accepted: Orders

## 2021-02-27 ENCOUNTER — Ambulatory Visit: Payer: Medicaid Other

## 2021-02-28 ENCOUNTER — Ambulatory Visit: Payer: Medicaid Other | Admitting: Family Medicine

## 2021-03-08 ENCOUNTER — Ambulatory Visit: Payer: Medicaid Other | Admitting: Family Medicine

## 2021-03-23 ENCOUNTER — Ambulatory Visit: Payer: Medicaid Other | Admitting: Family Medicine

## 2021-05-15 ENCOUNTER — Ambulatory Visit (INDEPENDENT_AMBULATORY_CARE_PROVIDER_SITE_OTHER): Payer: Medicaid Other

## 2021-05-15 ENCOUNTER — Encounter: Payer: Self-pay | Admitting: Family Medicine

## 2021-05-15 ENCOUNTER — Other Ambulatory Visit: Payer: Self-pay

## 2021-05-15 ENCOUNTER — Ambulatory Visit (INDEPENDENT_AMBULATORY_CARE_PROVIDER_SITE_OTHER): Payer: Medicaid Other | Admitting: Family Medicine

## 2021-05-15 VITALS — BP 126/90 | HR 76 | Wt 178.2 lb

## 2021-05-15 DIAGNOSIS — F39 Unspecified mood [affective] disorder: Secondary | ICD-10-CM

## 2021-05-15 DIAGNOSIS — Z23 Encounter for immunization: Secondary | ICD-10-CM

## 2021-05-15 DIAGNOSIS — H6691 Otitis media, unspecified, right ear: Secondary | ICD-10-CM

## 2021-05-15 MED ORDER — AMOXICILLIN 875 MG PO TABS: 875.0000 mg | ORAL_TABLET | Freq: Two times a day (BID) | ORAL | 0 refills | Status: AC

## 2021-05-15 MED ORDER — QUETIAPINE FUMARATE 50 MG PO TABS: ORAL_TABLET | ORAL | 1 refills | Status: DC

## 2021-05-15 NOTE — Patient Instructions (Signed)
Sent in antibiotic for your ear  COVID and flu vaccines today  Start seroquel - sent this I  Follow up with me in a few weeks as scheduled  Be well, Dr. Pollie Meyer

## 2021-05-15 NOTE — Progress Notes (Signed)
oquel  Date of Visit: 05/15/2021   SUBJECTIVE:   HPI:  Yesenia Weeks presents today for a same day appointment to discuss two issues:  R ear pain - began a few days ago. Prior to that had cold symptoms (congestion, cough). Her kids are also sick. They were tested for COVID, flu and RSV and were negative for COVID and flu but positive for RSV. Overall she is feeling ok just has severe pain in R ear. Also sinus pressure. No fevers. Notes when she blows nose sometimes has drainage come from her L eye.  Mood - has history of depression with anxiety, and in past we were concerned for a possible bipolar spectrum illness. Last started on seroquel in early 2020 but did not follow up or keep taking that, and ended up getting pregnant so was lost to follow up here for her mood. I saw her back in early spring of this year, and she was doing better but gave her resources for therapy. Currently reports short temper, getting irritated with her children, especially her 4yo Criss Alvine who has autism and has multiple therapy needs. Denies thoughts or harming herself or others. She is interested in medication to help her feel better and cope better with stressors. Previously did not tolerate sertraline, also did not stay on wellbutrin (reason unclear at this time). She would like to give the seroquel another try.  OBJECTIVE:   BP 126/90   Pulse 76   Wt 178 lb 3.2 oz (80.8 kg)   SpO2 100%   BMI 28.76 kg/m  Gen: no acute distress, pleasant, cooperative HEENT:  normocephalic, atraumatic. R tympanic membrane markedly erythematous. L tympanic membrane normal. Nares patent. Oropharynx clear and moist. No anterior cervical or supraclavicular lymphadenopathy.  Lungs: normal work of breathing  Neuro: alert, speech normal, grossly nonfocal Psych: normal range of affect, well groomed, speech normal in rate and volume, normal eye contact   ASSESSMENT/PLAN:   Health maintenance:  -COVID bivalent booster given today -flu shot  given today  Mood disorder (HCC) Uncontrolled and desiring medication to help cope with stressors. No SI/HI. Prior concern for possible bipolar spectrum illness so want to avoid unopposed antidepressant therapy. Will restart seroquel at 50mg  on night 1, with upward taper to 200mg  at night over 4 nights. Follow up with me in 2 weeks - appointment scheduled   R otitis media Notable on exam Treat with amoxicillin 875mg  twice daily for 7 days Follow up if not improving  FOLLOW UP: Follow up in 2 weeks with me for mood  J. , MD [facility]

## 2021-05-15 NOTE — Assessment & Plan Note (Signed)
Uncontrolled and desiring medication to help cope with stressors. No SI/HI. Prior concern for possible bipolar spectrum illness so want to avoid unopposed antidepressant therapy. Will restart seroquel at 50mg  on night 1, with upward taper to 200mg  at night over 4 nights. Follow up with me in 2 weeks - appointment scheduled

## 2021-05-16 ENCOUNTER — Telehealth: Payer: Self-pay

## 2021-05-16 NOTE — Telephone Encounter (Signed)
Patient calls nurse line regarding issues with allergy medication. Patient reports that cetirizine has not been working and she would like to try another allergy medication.   Please advise.   Veronda Prude, RN

## 2021-05-19 ENCOUNTER — Encounter: Payer: Self-pay | Admitting: Family Medicine

## 2021-05-19 MED ORDER — LORATADINE 10 MG PO TABS: 10.0000 mg | ORAL_TABLET | Freq: Every day | ORAL | 11 refills | Status: DC

## 2021-05-19 NOTE — Telephone Encounter (Signed)
Will rx claritin instead of zyrtec Sent patient mychart msg explaining  Yesenia Dodrill, MD

## 2021-05-23 DIAGNOSIS — F1220 Cannabis dependence, uncomplicated: Secondary | ICD-10-CM | POA: Diagnosis not present

## 2021-05-30 ENCOUNTER — Ambulatory Visit: Payer: Medicaid Other | Admitting: Family Medicine

## 2021-05-31 DIAGNOSIS — F1220 Cannabis dependence, uncomplicated: Secondary | ICD-10-CM | POA: Diagnosis not present

## 2021-06-06 DIAGNOSIS — F1210 Cannabis abuse, uncomplicated: Secondary | ICD-10-CM | POA: Diagnosis not present

## 2021-06-12 DIAGNOSIS — F1220 Cannabis dependence, uncomplicated: Secondary | ICD-10-CM | POA: Diagnosis not present

## 2021-06-13 ENCOUNTER — Ambulatory Visit: Payer: Medicaid Other | Admitting: Family Medicine

## 2021-06-21 DIAGNOSIS — F1220 Cannabis dependence, uncomplicated: Secondary | ICD-10-CM | POA: Diagnosis not present

## 2021-06-29 DIAGNOSIS — F1220 Cannabis dependence, uncomplicated: Secondary | ICD-10-CM | POA: Diagnosis not present

## 2021-07-05 DIAGNOSIS — F1220 Cannabis dependence, uncomplicated: Secondary | ICD-10-CM | POA: Diagnosis not present

## 2021-07-07 ENCOUNTER — Ambulatory Visit: Payer: Medicaid Other

## 2021-07-12 DIAGNOSIS — F1220 Cannabis dependence, uncomplicated: Secondary | ICD-10-CM | POA: Diagnosis not present

## 2021-07-18 DIAGNOSIS — F1220 Cannabis dependence, uncomplicated: Secondary | ICD-10-CM | POA: Diagnosis not present

## 2021-07-26 DIAGNOSIS — F1220 Cannabis dependence, uncomplicated: Secondary | ICD-10-CM | POA: Diagnosis not present

## 2021-08-02 DIAGNOSIS — F1220 Cannabis dependence, uncomplicated: Secondary | ICD-10-CM | POA: Diagnosis not present

## 2021-08-09 DIAGNOSIS — F1220 Cannabis dependence, uncomplicated: Secondary | ICD-10-CM | POA: Diagnosis not present

## 2021-08-15 DIAGNOSIS — F1220 Cannabis dependence, uncomplicated: Secondary | ICD-10-CM | POA: Diagnosis not present

## 2021-08-22 DIAGNOSIS — F1220 Cannabis dependence, uncomplicated: Secondary | ICD-10-CM | POA: Diagnosis not present

## 2021-08-29 DIAGNOSIS — F1220 Cannabis dependence, uncomplicated: Secondary | ICD-10-CM | POA: Diagnosis not present

## 2021-09-06 DIAGNOSIS — F1220 Cannabis dependence, uncomplicated: Secondary | ICD-10-CM | POA: Diagnosis not present

## 2021-09-14 DIAGNOSIS — F1220 Cannabis dependence, uncomplicated: Secondary | ICD-10-CM | POA: Diagnosis not present

## 2021-09-15 ENCOUNTER — Encounter: Payer: Self-pay | Admitting: Family Medicine

## 2021-09-15 NOTE — Progress Notes (Signed)
Patient has no-showed to multiple appointments in a 6-month period. Per our no-show policy, a letter has been routed to [facility] Admin to be mailed to patient regarding likely dismissal for repeat no show. Will CC to PCP.  ° °

## 2021-09-19 DIAGNOSIS — F1220 Cannabis dependence, uncomplicated: Secondary | ICD-10-CM | POA: Diagnosis not present

## 2021-09-25 DIAGNOSIS — F1220 Cannabis dependence, uncomplicated: Secondary | ICD-10-CM | POA: Diagnosis not present

## 2021-10-02 DIAGNOSIS — F1220 Cannabis dependence, uncomplicated: Secondary | ICD-10-CM | POA: Diagnosis not present

## 2021-10-04 DIAGNOSIS — F1220 Cannabis dependence, uncomplicated: Secondary | ICD-10-CM | POA: Diagnosis not present

## 2021-10-12 DIAGNOSIS — F1220 Cannabis dependence, uncomplicated: Secondary | ICD-10-CM | POA: Diagnosis not present

## 2021-10-18 DIAGNOSIS — F1220 Cannabis dependence, uncomplicated: Secondary | ICD-10-CM | POA: Diagnosis not present

## 2021-10-25 ENCOUNTER — Ambulatory Visit: Payer: Medicaid Other

## 2021-10-25 DIAGNOSIS — F1220 Cannabis dependence, uncomplicated: Secondary | ICD-10-CM | POA: Diagnosis not present

## 2021-10-27 ENCOUNTER — Ambulatory Visit: Payer: Medicaid Other

## 2021-10-30 ENCOUNTER — Encounter: Payer: Self-pay | Admitting: Radiology

## 2021-10-30 ENCOUNTER — Ambulatory Visit
Admission: RE | Admit: 2021-10-30 | Discharge: 2021-10-30 | Disposition: A | Discharge status: Home / Self Care | Payer: Medicaid Other | Source: Ambulatory Visit | Attending: Family Medicine | Admitting: Family Medicine

## 2021-10-30 DIAGNOSIS — Z1231 Encounter for screening mammogram for malignant neoplasm of breast: Secondary | ICD-10-CM | POA: Diagnosis not present

## 2021-11-02 DIAGNOSIS — F1220 Cannabis dependence, uncomplicated: Secondary | ICD-10-CM | POA: Diagnosis not present

## 2021-11-08 DIAGNOSIS — F1220 Cannabis dependence, uncomplicated: Secondary | ICD-10-CM | POA: Diagnosis not present

## 2021-11-15 DIAGNOSIS — F1220 Cannabis dependence, uncomplicated: Secondary | ICD-10-CM | POA: Diagnosis not present

## 2021-11-22 DIAGNOSIS — F1220 Cannabis dependence, uncomplicated: Secondary | ICD-10-CM | POA: Diagnosis not present

## 2021-11-28 ENCOUNTER — Encounter: Payer: Medicaid Other | Admitting: Family Medicine

## 2021-12-12 ENCOUNTER — Encounter: Payer: Medicaid Other | Admitting: Family Medicine

## 2021-12-26 ENCOUNTER — Encounter: Payer: Self-pay | Admitting: *Deleted

## 2021-12-28 ENCOUNTER — Ambulatory Visit (INDEPENDENT_AMBULATORY_CARE_PROVIDER_SITE_OTHER): Payer: Medicaid Other | Admitting: Family Medicine

## 2021-12-28 ENCOUNTER — Encounter: Payer: Self-pay | Admitting: Family Medicine

## 2021-12-28 ENCOUNTER — Other Ambulatory Visit: Payer: Self-pay

## 2021-12-28 ENCOUNTER — Telehealth: Payer: Self-pay | Admitting: Family Medicine

## 2021-12-28 VITALS — BP 121/80 | HR 78 | Wt 181.6 lb

## 2021-12-28 DIAGNOSIS — Z1159 Encounter for screening for other viral diseases: Secondary | ICD-10-CM | POA: Diagnosis not present

## 2021-12-28 DIAGNOSIS — R42 Dizziness and giddiness: Secondary | ICD-10-CM

## 2021-12-28 DIAGNOSIS — R232 Flushing: Secondary | ICD-10-CM

## 2021-12-28 DIAGNOSIS — M130 Polyarthritis, unspecified: Secondary | ICD-10-CM | POA: Diagnosis not present

## 2021-12-28 DIAGNOSIS — Z309 Encounter for contraceptive management, unspecified: Secondary | ICD-10-CM

## 2021-12-28 DIAGNOSIS — F39 Unspecified mood [affective] disorder: Secondary | ICD-10-CM | POA: Diagnosis not present

## 2021-12-28 LAB — POCT URINALYSIS DIP (MANUAL ENTRY)
Bilirubin, UA: NEGATIVE
Blood, UA: NEGATIVE
Glucose, UA: NEGATIVE mg/dL
Ketones, POC UA: NEGATIVE mg/dL
Leukocytes, UA: NEGATIVE
Nitrite, UA: NEGATIVE
Protein Ur, POC: NEGATIVE mg/dL
Spec Grav, UA: <=1.005 — AB (ref 1.010–1.025)
Urobilinogen, UA: 0.2 E.U./dL
pH, UA: 6.5 (ref 5.0–8.0)

## 2021-12-28 LAB — POCT URINE PREGNANCY: Preg Test, Ur: NEGATIVE

## 2021-12-28 MED ORDER — NORETHINDRONE 0.35 MG PO TABS: 1.0000 | ORAL_TABLET | Freq: Every day | ORAL | 5 refills | Status: DC

## 2021-12-28 MED ORDER — IBUPROFEN 600 MG PO TABS: 600.0000 mg | ORAL_TABLET | Freq: Every day | ORAL | 0 refills | Status: DC | PRN

## 2021-12-28 NOTE — Telephone Encounter (Signed)
Called patient and discussed Specific gravity was only item flagging as abnormal - advised it means she was very well hydrated, not otherwise a concern to me. No signs of infection Patient appreciative. Latrelle Dodrill, MD

## 2021-12-28 NOTE — Patient Instructions (Signed)
It was great to see you again today!  Checking lots of labs today See psychiatry list below - call for an appointment Sent in ibuprofen for you - take once daily as needed for pain  Check out www.bedsider.org for information on birth control  Follow up with me in 1 month, sooner if needed  Be well, Dr. Ardelia Mems  Psychiatry Resource List (Adults and Children) Most of these providers will take Medicaid. please consult your insurance for a complete and updated list of available providers. When calling to make an appointment have your insurance information available to confirm you are covered.   BestDay:Psychiatry and Counseling [address] [facility] [place] [facility] [address] [place], [place] [address] [phone]  [facility]  [place], [place]:   [facility]: [address]     [phone]   Linna Hoff: [place]. [place],        [phone] [place]: [place],    [place]: [phone] [address],                   [phone] Children: [place] [address]         [phone]  [place] (virtual only) [phone]   [facility]  (Psychiatry only; Adults /children 12 and over, will take Medicaid)  [place], [place], [place] [address]       [phone]   [place] (Psychiatry & counseling ; adults & children ; will take Medicaid [address]  [place] [address]  Go on-line to complete referral ( https://www.[email or web address].org/en/make-[email or web address]-referral [phone]    (Spanish speaking therapists)  [facility]  Psychiatry & counseling; Adults and children;  Call Registration prior to scheduling an appointment [phone] [place]    [place], [place] [address]     [phone]  [facility] (Psychiatry & counseling; adults & children; Medicare no Medicaid)  [place],   [address]      [phone]    [facility] (up to age 93)  Psychiatry & counseling ,will take Medicaid, must do counseling to receive psychiatry services  [address] [place] [address]        ([place] (Psychiatry & counseling; adults & children; will take Medicaid) Will need a referral from provider [address],  [place], [place]  [phone]   [facility] --- Walk-In Mon-Friday 8am-3pm ( will take Medicaid, Psychiatry, Adults & children,  [address] [place], [place]   [phone]   [facility] [place]--, Walk-in M-F 8am-12pm and 1pm -3pm   (Counseling, Psychiatry, will take Medicaid, adults & children)  [address], [place], [place]  [phone]

## 2021-12-28 NOTE — Telephone Encounter (Signed)
Please let patient know she can discuss with the psychiatrist when she sees them. I am not trained to test adults for autism - that is something a psychiatrist can give further information on. I provided patient a list of psychiatrists at today's appointment.  Thanks, Leeanne Rio, MD

## 2021-12-28 NOTE — Progress Notes (Unsigned)
  Date of Visit: 12/28/2021   SUBJECTIVE:   HPI:  Jayline presents today to discuss several issues.  Mood - continues to be an issue. Took seroquel for a few weeks, thinks she maybe improved slightly but it made her too sleepy to continue. Denies SI/HI. Getting more frustrated lately - lots of stress with caring for her children, one of whom is on autism spectrum and she suspects the youngest may be as well.  Aches/pains - notices aching/pains in her wrists, ankles, knees, back. No fevers. + family history of autoimmune disease (daughter has severe psoriasis)  Contraception - last depo shot 02/20/21. Gets periods monthly. Has noticed increased hot flashes, wonders if she is perimenopausal.  Feels hot, dizzy lightheaded more often lately. Notices if she stands up or goes from bending to standing up feels lightheaded.   OBJECTIVE:   BP 121/80   Pulse 78   Wt 181 lb 9.6 oz (82.4 kg)   SpO2 100%   BMI 29.31 kg/m  Gen: no acute distress, pleasant, cooperative, well appearing HEENT: normocephalic, atraumatic  Heart: regular rate and rhythm, no murmur Lungs: clear to auscultation bilaterally, normal work of breathing  Neuro: alert, speech normal, grossly nonfocal Ext: no effusions present in wrists, fingers, knees, ankles. No appreciable lower extremity edema bilaterally   ASSESSMENT/PLAN:   Health maintenance:  -hep C antibody drawn today for routine screening  Mood disorder (HCC) Persists.  No SI/HI.  Given prior concern for possible bipolar spectrum illness, I feel patient would be best served by seeing psychiatry.  She is agreeable to this and interested.  Given list of psychiatry providers today.  She will call to schedule.  Contraception management Discussed options with patient.  Encouraged her to consider IUD or other LARC.  Given information about various options.  For now, we will send in norethindrone.  Patient is aware that she needs to take these at the same time every day  for optimal efficacy.   Polyarthritis Complains of multiple joint aches/pains. No swelling or synovitis detectable on exam today. Given family history of autoimmune disease we will check labs today.  Check sed rate, CRP, ANA, RF.  Sent in ibuprofen for patient.  Dizziness Etiology unclear - possibly related to stress. Orthostatics negative today.  Check labs today: CBC, CMET, TSH, FSH.  Patient also complained of her urine appearing more clear than normal.  UA today unremarkable except for low specific gravity.  Suspect she is just very well-hydrated (admits to increased water intake recently)  FOLLOW UP: Follow up in 1 mo for above issues.   [place] J. Pollie Meyer, MD [facility]

## 2021-12-28 NOTE — Telephone Encounter (Signed)
Patient called to let Dr. Pollie Meyer know there was one thing she forgot to discuss with her this morning at her appointment. She wants to discuss being tested for Autism. Please call patient back to discuss.

## 2021-12-28 NOTE — Telephone Encounter (Signed)
Pt informed about the autism. She had a questions about her urine result being abnormal. Please advise. Humphrey Guerreiro Yesenia Weeks, CMA

## 2021-12-29 LAB — CMP14+EGFR
ALT: 13 IU/L (ref 0–32)
AST: 23 IU/L (ref 0–40)
Albumin/Globulin Ratio: 1.4 (ref 1.2–2.2)
Albumin: 4.1 g/dL (ref 3.8–4.8)
Alkaline Phosphatase: 89 IU/L (ref 44–121)
BUN/Creatinine Ratio: 15 (ref 9–23)
BUN: 12 mg/dL (ref 6–24)
Bilirubin Total: 0.3 mg/dL (ref 0.0–1.2)
CO2: 21 mmol/L (ref 20–29)
Calcium: 9.4 mg/dL (ref 8.7–10.2)
Chloride: 103 mmol/L (ref 96–106)
Creatinine, Ser: 0.8 mg/dL (ref 0.57–1.00)
Globulin, Total: 3 g/dL (ref 1.5–4.5)
Glucose: 95 mg/dL (ref 70–99)
Potassium: 4.4 mmol/L (ref 3.5–5.2)
Sodium: 139 mmol/L (ref 134–144)
Total Protein: 7.1 g/dL (ref 6.0–8.5)
eGFR: 93 mL/min/{1.73_m2} (ref >59)

## 2021-12-29 LAB — CBC
Hematocrit: 37 % (ref 34.0–46.6)
Hemoglobin: 11.8 g/dL (ref 11.1–15.9)
MCH: 26.4 pg — ABNORMAL LOW (ref 26.6–33.0)
MCHC: 31.9 g/dL (ref 31.5–35.7)
MCV: 83 fL (ref 79–97)
Platelets: 273 10*3/uL (ref 150–450)
RBC: 4.47 x10E6/uL (ref 3.77–5.28)
RDW: 12.2 % (ref 11.7–15.4)
WBC: 8.4 10*3/uL (ref 3.4–10.8)

## 2021-12-29 LAB — FOLLICLE STIMULATING HORMONE: FSH: 4.7 m[IU]/mL

## 2021-12-29 LAB — HCV INTERPRETATION

## 2021-12-29 LAB — ANA: Anti Nuclear Antibody (ANA): NEGATIVE

## 2021-12-29 LAB — SEDIMENTATION RATE: Sed Rate: 30 mm/hr (ref 0–32)

## 2021-12-29 LAB — HCV AB W REFLEX TO QUANT PCR: HCV Ab: NONREACTIVE

## 2021-12-29 LAB — RHEUMATOID FACTOR: Rheumatoid fact SerPl-aCnc: 12.2 IU/mL (ref <14.0)

## 2021-12-29 LAB — TSH RFX ON ABNORMAL TO FREE T4: TSH: 0.845 u[IU]/mL (ref 0.450–4.500)

## 2021-12-29 LAB — C-REACTIVE PROTEIN: CRP: 14 mg/L — ABNORMAL HIGH (ref 0–10)

## 2021-12-30 NOTE — Assessment & Plan Note (Signed)
Persists.  No SI/HI.  Given prior concern for possible bipolar spectrum illness, I feel patient would be best served by seeing psychiatry.  She is agreeable to this and interested.  Given list of psychiatry providers today.  She will call to schedule.

## 2021-12-30 NOTE — Assessment & Plan Note (Signed)
Discussed options with patient.  Encouraged her to consider IUD or other LARC.  Given information about various options.  For now, we will send in norethindrone.  Patient is aware that she needs to take these at the same time every day for optimal efficacy.

## 2022-04-16 ENCOUNTER — Other Ambulatory Visit: Payer: Self-pay | Admitting: Family Medicine

## 2022-04-17 ENCOUNTER — Encounter: Payer: Self-pay | Admitting: Family Medicine

## 2022-04-17 NOTE — Telephone Encounter (Signed)
Sent patient mychart msg to clarify if she is taking this, as record shows claritin

## 2022-04-20 NOTE — Telephone Encounter (Signed)
Patient has not written back so will decline request If she reaches out saying she wants it I will be happy to refill Yesenia Rio, MD

## 2022-04-24 MED ORDER — LORATADINE 10 MG PO TABS: 10.0000 mg | ORAL_TABLET | Freq: Every day | ORAL | 11 refills | Status: DC

## 2022-05-14 ENCOUNTER — Ambulatory Visit: Payer: Medicaid Other | Admitting: Family Medicine

## 2022-06-27 NOTE — Progress Notes (Deleted)
    SUBJECTIVE:   CHIEF COMPLAINT / HPI:   Contraception   PERTINENT  PMH / PSH: ***  OBJECTIVE:   There were no vitals taken for this visit.  ***  ASSESSMENT/PLAN:   No problem-specific Assessment & Plan notes found for this encounter.     Levin Erp, MD [facility]

## 2022-06-29 ENCOUNTER — Ambulatory Visit: Payer: Medicaid Other | Admitting: Student

## 2022-11-01 ENCOUNTER — Ambulatory Visit (INDEPENDENT_AMBULATORY_CARE_PROVIDER_SITE_OTHER): Payer: Medicaid Other | Admitting: Family Medicine

## 2022-11-01 ENCOUNTER — Encounter: Payer: Self-pay | Admitting: Family Medicine

## 2022-11-01 VITALS — BP 122/80 | HR 91 | Temp 98.3°F | Wt 183.0 lb

## 2022-11-01 DIAGNOSIS — Z309 Encounter for contraceptive management, unspecified: Secondary | ICD-10-CM | POA: Diagnosis not present

## 2022-11-01 DIAGNOSIS — H00014 Hordeolum externum left upper eyelid: Secondary | ICD-10-CM

## 2022-11-01 DIAGNOSIS — H00019 Hordeolum externum unspecified eye, unspecified eyelid: Secondary | ICD-10-CM | POA: Insufficient documentation

## 2022-11-01 LAB — POCT URINE PREGNANCY: Preg Test, Ur: NEGATIVE

## 2022-11-01 MED ORDER — MEDROXYPROGESTERONE ACETATE 150 MG/ML IM SUSY: 150.0000 mg | PREFILLED_SYRINGE | Freq: Once | INTRAMUSCULAR | Status: DC

## 2022-11-01 MED ORDER — MEDROXYPROGESTERONE ACETATE 150 MG/ML IM SUSY
150.0000 mg | PREFILLED_SYRINGE | Freq: Once | INTRAMUSCULAR | Status: AC
  Administered 2022-11-01: 150 mg via INTRAMUSCULAR

## 2022-11-01 NOTE — Patient Instructions (Signed)
It was wonderful to see you today! Thank you for choosing [facility].   Please bring ALL of your medications with you to every visit.   Today we talked about:  You got the Depo shot today as a bridge to another contraceptive option. We discussed the Nexplanon and I think it would be a good option to consider since you do not like taking pills and had a prior bad experience with IUD. Please see the following website for more information on the Nexplanon: https://www.bedsider.org/birth-control/implant In regards to your stye, I would recommend frequent warm compresses (5 minutes every couple hours) to help the clogged duct drain. I would use Visine eye drops to help with the itchy and irritation. You can use Tylenol or Ibuprofen as needed for pain. If you start to develop fevers or significant change in vision in that eye please come back to see [place]  Please follow up with PCP on 12/03/2022  If you haven't already, sign up for My Chart to have easy access to your labs results, and communication with your primary care physician.   We are checking some labs today. If they are abnormal, I will call you. If they are normal, I will send you a MyChart message (if it is active) or a letter in the mail. If you do not hear about your labs in the next 2 weeks, please call the office.  Call the clinic at [phone] if your symptoms worsen or you have any concerns.  Please be sure to schedule follow up at the front desk before you leave today.   Elberta Fortis, DO Family Medicine

## 2022-11-01 NOTE — Progress Notes (Signed)
    SUBJECTIVE:   CHIEF COMPLAINT / HPI:   Stye Reports progressive swelling of L eyelid over the past week. States it is itchy and she has to focus her eye some times due to swelling. Denies fever, visual changes and drainage from the site. Does not wear makeup. States she had one previously years ago. Feels like there may be a head to it.  Birth control counseling Requesting Depo to help bridge until she decides what contraception she would like. Has been on Depo previously for 5+ years, discussed risk of bone loss. Patient does not want to be on Depo long term but did not remember to take the POPs prescribed at her last visit. Prior bad experience with IUD. Smoker over 35 and not eligible for estrogen containing options.  PERTINENT  PMH / PSH: Smoker  OBJECTIVE:   BP 122/80   Pulse 91   Temp 98.3 F (36.8 C)   Wt 183 lb (83 kg)   LMP  (LMP Unknown)   SpO2 98%   BMI 29.54 kg/m    General: NAD, pleasant, able to participate in exam HEENT: PERRLA, L upper eye lid with swelling and erythema, possible head on upper medial surface. No frank drainage. Mildly erythematous L sclera. R eye unremarkable. Skin: warm and dry, no rashes noted Neuro: alert, no obvious focal deficits Psych: Normal affect and mood  ASSESSMENT/PLAN:   Contraception management Requesting Depo today, has been on Depo for cumulative 5+ years previously. Discussed risk of bone loss but patient would like to use it as a bridge while she decides on other contraception. Recommended one time injection and follow up with PCP to discuss Nexplanon placement. Provided resources on Nexplanon for patient to review. Declined IUD and did not remember to take POP prescribed at last visit. Not eligible for estrogen containing options given smoker >64 years old -UPT negative, received Depo shot -PCP f/u scheduled for 12/03/2022 during visit  Hordeolum externum (stye) Exam consistent with stye. No concern features such as fever  or visual changes. -Advised frequent warm compresses, Visine as need for itching and Tylenol/Ibuprofen as needed for pain management -Return precautions given     Dr. Elberta Fortis, DO  [facility]

## 2022-11-01 NOTE — Assessment & Plan Note (Addendum)
Exam consistent with stye. No concern features such as fever or visual changes. -Advised frequent warm compresses, Visine as need for itching and Tylenol/Ibuprofen as needed for pain management -Return precautions given

## 2022-11-01 NOTE — Progress Notes (Signed)
Patient here today for Depo Provera injection Negative pregnancy test.  Depo given in LD today.  Site unremarkable & patient tolerated injection.    Next injection due 01/17/2023-01/31/2023.  Reminder card given.    Sunday Spillers, CMA

## 2022-11-01 NOTE — Assessment & Plan Note (Addendum)
Requesting Depo today, has been on Depo for cumulative 5+ years previously. Discussed risk of bone loss but patient would like to use it as a bridge while she decides on other contraception. Recommended one time injection and follow up with PCP to discuss Nexplanon placement. Provided resources on Nexplanon for patient to review. Declined IUD and did not remember to take POP prescribed at last visit. Not eligible for estrogen containing options given smoker >50 years old -UPT negative, received Depo shot -PCP f/u scheduled for 12/03/2022 during visit

## 2022-12-03 ENCOUNTER — Ambulatory Visit: Payer: Medicaid Other | Admitting: Family Medicine

## 2023-01-01 ENCOUNTER — Encounter: Payer: Self-pay | Admitting: Family Medicine

## 2023-01-01 ENCOUNTER — Other Ambulatory Visit: Payer: Self-pay | Admitting: Family Medicine

## 2023-01-01 ENCOUNTER — Ambulatory Visit (HOSPITAL_COMMUNITY)
Admission: RE | Admit: 2023-01-01 | Discharge: 2023-01-01 | Disposition: A | Discharge status: Home / Self Care | Payer: Medicaid Other | Source: Ambulatory Visit | Attending: Family Medicine | Admitting: Family Medicine

## 2023-01-01 ENCOUNTER — Ambulatory Visit (INDEPENDENT_AMBULATORY_CARE_PROVIDER_SITE_OTHER): Payer: Medicaid Other | Admitting: Family Medicine

## 2023-01-01 ENCOUNTER — Encounter: Payer: Medicaid Other | Admitting: Family Medicine

## 2023-01-01 VITALS — BP 118/78 | HR 80 | Ht 65.5 in | Wt 178.0 lb

## 2023-01-01 DIAGNOSIS — R079 Chest pain, unspecified: Secondary | ICD-10-CM | POA: Insufficient documentation

## 2023-01-01 DIAGNOSIS — M79672 Pain in left foot: Secondary | ICD-10-CM | POA: Diagnosis not present

## 2023-01-01 DIAGNOSIS — Z0000 Encounter for general adult medical examination without abnormal findings: Secondary | ICD-10-CM | POA: Diagnosis not present

## 2023-01-01 DIAGNOSIS — Z1211 Encounter for screening for malignant neoplasm of colon: Secondary | ICD-10-CM

## 2023-01-01 DIAGNOSIS — Z1231 Encounter for screening mammogram for malignant neoplasm of breast: Secondary | ICD-10-CM

## 2023-01-01 MED ORDER — DICLOFENAC SODIUM 1 % EX GEL
4.0000 g | Freq: Four times a day (QID) | CUTANEOUS | 3 refills | Status: DC
Start: 2023-01-01 — End: 2024-04-23

## 2023-01-01 NOTE — Patient Instructions (Addendum)
It was great seeing you today!  We did your physical and I placed a referral to get your colonoscopy. You can call the number attached to schedule it  You can also walk across the street to the hospital and get your chest x ray done.   Your EKG of your heart looked normal  Please check-out at the front desk before leaving the clinic. Schedule to see me next week for blood work and follow up, but if you need to be seen earlier than that for any new issues we're happy to fit you in, just give [place] a call!   Feel free to call with any questions or concerns at any time, at [phone].   Take care,  Dr. Cora Collum [facility]

## 2023-01-01 NOTE — Progress Notes (Unsigned)
    SUBJECTIVE:   Chief compliant/HPI: annual examination  Yesenia Weeks is a 47 y.o. who presents today for an annual exam.   She also complains of intermittent chest pain and associated left ear pain As soon as her chest hurts her ear does as well.  States its a sharp/numbing feeling. Hard to described  Noticed it about 2-3 times in the past month and a half No radiation of pain down arms, up jaw Does feel stressed but not as much as before  Not worsened with food   Also endorses pain in knees, feet and back Followed with sports med clinic  Not exercising   Tobacco use- occasionally with alcohol  Alcohol- occasional/ social   Psych appointment Friday for anxiety   Not on medication  Wanting to get colonoscopy  Scheduled mammogram for later this week     OBJECTIVE:   BP 118/78   Pulse 80   Ht 5' 5.5" (1.664 m)   Wt 178 lb (80.7 kg)   SpO2 98%   BMI 29.17 kg/m    General: alert, pleasant, NAD CV: RRR no murmurs  Resp: CTAB normal WOB GI: soft, non distended  Derm: warm, dry. No LE edema  MSK: mild tenderness to palpation when palpating center of chest   ASSESSMENT/PLAN:   Chest pain Occurring intermittently for the past month and a half. Currently without pain. EKG showed NSR without signs of ischemia. Vitals stable. Possibly related to anxiety or GERD. Could also potentially be costochondritis as pressing on her chest illicits some discomfort. Ordered CXR per patient's request which is reasonable. Will obtain blood work at follow up next week to assess for anemia and check electrolytes and thyroid. Return precautions discussed.     Annual Examination  See AVS for age appropriate recommendations.   PHQ score 7, reviewed and discussed.  Blood pressure reviewed and at goal .  No new partners or concern for STIs   Considered the following items based upon USPSTF recommendations: Reviewed risk factors for latent tuberculosis and not indicated Reviewed risk  factors for osteoporosis. Low risk.  Blood work not obtained due to lab closure. Can obtain at follow up scheduled next week.  Cervical cancer screening: prior Pap reviewed, repeat due in Jan 2026 Breast cancer screening:  scheduled Colorectal cancer screening: discussed, colonoscopy ordered    Follow up scheduled for next week   Cora Collum, DO [facility]

## 2023-01-03 ENCOUNTER — Ambulatory Visit
Admission: RE | Admit: 2023-01-03 | Discharge: 2023-01-03 | Disposition: A | Discharge status: Home / Self Care | Payer: Medicaid Other | Source: Ambulatory Visit | Attending: Family Medicine | Admitting: Family Medicine

## 2023-01-03 DIAGNOSIS — Z1231 Encounter for screening mammogram for malignant neoplasm of breast: Secondary | ICD-10-CM | POA: Diagnosis not present

## 2023-01-03 DIAGNOSIS — R079 Chest pain, unspecified: Secondary | ICD-10-CM | POA: Insufficient documentation

## 2023-01-03 NOTE — Assessment & Plan Note (Addendum)
Occurring intermittently for the past month and a half. Currently without pain. EKG showed NSR without signs of ischemia. Vitals stable. Possibly related to anxiety or GERD. Could also potentially be costochondritis as pressing on her chest illicits some discomfort. Ordered CXR per patient's request which is reasonable. Will obtain blood work at follow up next week to assess for anemia and check electrolytes and thyroid. Return precautions discussed.

## 2023-01-08 ENCOUNTER — Ambulatory Visit: Payer: Self-pay | Admitting: Family Medicine

## 2023-01-10 ENCOUNTER — Encounter: Payer: Self-pay | Admitting: Family Medicine

## 2023-01-10 ENCOUNTER — Ambulatory Visit (INDEPENDENT_AMBULATORY_CARE_PROVIDER_SITE_OTHER): Payer: Medicaid Other | Admitting: Family Medicine

## 2023-01-10 VITALS — BP 112/78 | HR 98 | Wt 175.0 lb

## 2023-01-10 DIAGNOSIS — R079 Chest pain, unspecified: Secondary | ICD-10-CM

## 2023-01-10 NOTE — Assessment & Plan Note (Signed)
Patient following up on chest pain from a couple of weeks ago. Has not had any more pain since then. Was not able to get CXR but will get today or tomorrow. Will also do blood work to check on other potential causes. - TSH, CBC, BMP

## 2023-01-10 NOTE — Progress Notes (Signed)
    SUBJECTIVE:   CHIEF COMPLAINT / HPI:   Presents for follow-up.  Was seen about 10 days ago for chest pain, a EKG was normal, and thought to maybe be costochondritis.  Did order chest x-ray per patient's request. Blood work?   Chest pain has not returned Did not have time to get CXR  Presents for blood work Denies any additional questions or concerns.   PERTINENT  PMH / PSH: Reviewed   OBJECTIVE:   BP 112/78   Pulse 98   Wt 175 lb (79.4 kg)   SpO2 98%   BMI 28.68 kg/m    Physical exam General: well appearing, NAD Cardiovascular: RRR, no murmurs Lungs: CTAB. Normal WOB Abdomen: soft, non-distended Skin: warm, dry. No edema  ASSESSMENT/PLAN:   Chest pain Patient following up on chest pain from a couple of weeks ago. Has not had any more pain since then. Was not able to get CXR but will get today or tomorrow. Will also do blood work to check on other potential causes. - TSH, CBC, BMP   Will also check lipid panel since obtaining labs   Cora Collum, DO [facility]

## 2023-01-11 LAB — BASIC METABOLIC PANEL
BUN/Creatinine Ratio: 17 (ref 9–23)
BUN: 13 mg/dL (ref 6–24)
CO2: 22 mmol/L (ref 20–29)
Calcium: 9.2 mg/dL (ref 8.7–10.2)
Chloride: 105 mmol/L (ref 96–106)
Creatinine, Ser: 0.76 mg/dL (ref 0.57–1.00)
Glucose: 80 mg/dL (ref 70–99)
Potassium: 3.7 mmol/L (ref 3.5–5.2)
Sodium: 139 mmol/L (ref 134–144)
eGFR: 98 mL/min/{1.73_m2} (ref >59)

## 2023-01-11 LAB — LIPID PANEL
Chol/HDL Ratio: 2.5 ratio (ref 0.0–4.4)
Cholesterol, Total: 155 mg/dL (ref 100–199)
HDL: 61 mg/dL (ref >39)
LDL Chol Calc (NIH): 77 mg/dL (ref 0–99)
Triglycerides: 89 mg/dL (ref 0–149)
VLDL Cholesterol Cal: 17 mg/dL (ref 5–40)

## 2023-01-11 LAB — CBC
Hematocrit: 35.6 % (ref 34.0–46.6)
Hemoglobin: 11.3 g/dL (ref 11.1–15.9)
MCH: 26.5 pg — ABNORMAL LOW (ref 26.6–33.0)
MCHC: 31.7 g/dL (ref 31.5–35.7)
MCV: 84 fL (ref 79–97)
Platelets: 239 10*3/uL (ref 150–450)
RBC: 4.26 x10E6/uL (ref 3.77–5.28)
RDW: 12.3 % (ref 11.7–15.4)
WBC: 10.4 10*3/uL (ref 3.4–10.8)

## 2023-01-11 LAB — TSH RFX ON ABNORMAL TO FREE T4: TSH: 0.878 u[IU]/mL (ref 0.450–4.500)

## 2023-01-16 DIAGNOSIS — H524 Presbyopia: Secondary | ICD-10-CM | POA: Diagnosis not present

## 2023-01-16 DIAGNOSIS — H5213 Myopia, bilateral: Secondary | ICD-10-CM | POA: Diagnosis not present

## 2023-01-16 DIAGNOSIS — H5203 Hypermetropia, bilateral: Secondary | ICD-10-CM | POA: Diagnosis not present

## 2023-02-04 NOTE — Telephone Encounter (Signed)
A user error has taken place: encounter opened in error, closed for administrative reasons.

## 2023-02-15 ENCOUNTER — Ambulatory Visit: Payer: Medicaid Other | Admitting: Student

## 2023-02-18 ENCOUNTER — Ambulatory Visit: Payer: Medicaid Other | Admitting: Family Medicine

## 2023-03-04 DIAGNOSIS — F331 Major depressive disorder, recurrent, moderate: Secondary | ICD-10-CM | POA: Diagnosis not present

## 2023-04-01 DIAGNOSIS — F331 Major depressive disorder, recurrent, moderate: Secondary | ICD-10-CM | POA: Diagnosis not present

## 2023-04-17 DIAGNOSIS — H5213 Myopia, bilateral: Secondary | ICD-10-CM | POA: Diagnosis not present

## 2023-04-30 ENCOUNTER — Ambulatory Visit: Payer: MEDICAID | Admitting: Family Medicine

## 2023-05-06 ENCOUNTER — Encounter: Payer: Self-pay | Admitting: Family Medicine

## 2023-05-06 ENCOUNTER — Telehealth: Payer: Self-pay

## 2023-05-06 ENCOUNTER — Ambulatory Visit (INDEPENDENT_AMBULATORY_CARE_PROVIDER_SITE_OTHER): Payer: MEDICAID | Admitting: Family Medicine

## 2023-05-06 ENCOUNTER — Other Ambulatory Visit: Payer: Self-pay

## 2023-05-06 VITALS — BP 136/90 | HR 88 | Ht 65.5 in | Wt 179.0 lb

## 2023-05-06 DIAGNOSIS — Z0000 Encounter for general adult medical examination without abnormal findings: Secondary | ICD-10-CM | POA: Diagnosis not present

## 2023-05-06 DIAGNOSIS — Z23 Encounter for immunization: Secondary | ICD-10-CM

## 2023-05-06 DIAGNOSIS — Z3042 Encounter for surveillance of injectable contraceptive: Secondary | ICD-10-CM

## 2023-05-06 DIAGNOSIS — R079 Chest pain, unspecified: Secondary | ICD-10-CM

## 2023-05-06 DIAGNOSIS — R030 Elevated blood-pressure reading, without diagnosis of hypertension: Secondary | ICD-10-CM | POA: Diagnosis not present

## 2023-05-06 DIAGNOSIS — Z309 Encounter for contraceptive management, unspecified: Secondary | ICD-10-CM

## 2023-05-06 LAB — POCT URINALYSIS DIP (MANUAL ENTRY)
Bilirubin, UA: NEGATIVE
Glucose, UA: NEGATIVE mg/dL
Ketones, POC UA: NEGATIVE mg/dL
Leukocytes, UA: NEGATIVE
Nitrite, UA: NEGATIVE
Protein Ur, POC: NEGATIVE mg/dL
Spec Grav, UA: >=1.03 — AB (ref 1.010–1.025)
Urobilinogen, UA: 0.2 U/dL
pH, UA: 5.5 (ref 5.0–8.0)

## 2023-05-06 LAB — POCT URINE PREGNANCY: Preg Test, Ur: NEGATIVE

## 2023-05-06 NOTE — Patient Instructions (Addendum)
It was great to see you again today.  Flu and COVID shots today  [facility]: [phone]   Follow up as scheduled for nexplanon insertion  Go get xray  Be well, Dr. Pollie Meyer

## 2023-05-06 NOTE — Telephone Encounter (Signed)
Patient calls nurse line in regards to UA results.   She reports she viewed the results on mychart and has some concerns. She reports she forgot to mention to PCP she has been experiencing low back pain and urinary frequency for the past few weeks. She reports "pressure" when she goes to the bathroom, however denies dysuria.   She denies abdominal pain, fevers, chills, abnormal odor or color. No nausea or vomiting.   She would like to be treated if PCP feels appropriate given symptoms and UA.   Advised will send to PCP.   Patient encouraged to push fluids.

## 2023-05-06 NOTE — Progress Notes (Unsigned)
  Date of Visit: 05/06/2023   SUBJECTIVE:   HPI:  Yesenia Weeks presents today for discussion of birth control.  Birth control: Currently not using any forms of birth control.  Sexually active with 1 female partner in the last year (her husband).  Is not sure what form of birth control she wants.  Gets periods about every month.  Occasionally it is a normal multiday period, other times is just spotting.  She denies spotting in between those monthly bleeding sessions.  Last period was a few weeks ago.  Chest pain: Seen for this several months ago, had chest x-ray ordered which she never got.  She is curious if she can still get it.  Has had intermittent episodes of pain lasting 2 to 3 minutes at a time.  It is on the right side of her chest under her breast.  When the chest pain happens her right ear hurts as well.  Has happened a total of a few times over the last several years.  Denies shortness of breath or vomiting.  Able to ambulate around without any chest pain.  Seeing psych - on [employer], on Prozac, doing very well.  OBJECTIVE:   BP (!) 136/90   Pulse 88   Ht 5' 5.5" (1.664 m)   Wt 179 lb (81.2 kg)   SpO2 100%   BMI 29.33 kg/m  Gen: No acute distress, pleasant, cooperative, well-appearing HEENT: Normocephalic, atraumatic Heart: Regular rate and rhythm, no murmur, chest wall nontender to palpation Lungs: Clear to auscultation bilaterally, normal effort Neuro: Grossly nonfocal, speech normal Ext: No edema. Psych: normal range of affect, well groomed, speech normal in rate and volume, normal eye contact   ASSESSMENT/PLAN:   Assessment & Plan Encounter for contraceptive management, unspecified type Discussed options for birth control.  After discussion, patient opted for Nexplanon.  Urine pregnancy test negative today.  Last unprotected intercourse yesterday.  Patient will follow-up in several weeks for Nexplanon placement. Chest pain, unspecified type Etiology unclear, not main reason  for visit today.  She mostly wanted to know if she was able to still go get the chest x-ray that was ordered previously.  I reordered this under my name since Dr. Idalia Needle graduated and is no longer here.  Does not sound cardiac in nature.  Previously unremarkable EKG.  Obtain chest x-ray, follow-up. Routine adult health maintenance Flu and COVID vaccines given today Given phone number to schedule colonoscopy, referral previously placed Elevated blood pressure reading Noted today, previously without diagnosis of hypertension Recheck when I see her in a few weeks.    FOLLOW UP: Follow up in a few weeks for Nexplanon placement  [place] J. Pollie Meyer, MD [facility] Family [facility]

## 2023-05-08 DIAGNOSIS — Z0000 Encounter for general adult medical examination without abnormal findings: Secondary | ICD-10-CM | POA: Insufficient documentation

## 2023-05-08 NOTE — Assessment & Plan Note (Signed)
Flu and COVID vaccines given today Given phone number to schedule colonoscopy, referral previously placed

## 2023-05-08 NOTE — Assessment & Plan Note (Signed)
Etiology unclear, not main reason for visit today.  She mostly wanted to know if she was able to still go get the chest x-ray that was ordered previously.  I reordered this under my name since Dr. Idalia Needle graduated and is no longer here.  Does not sound cardiac in nature.  Previously unremarkable EKG.  Obtain chest x-ray, follow-up.

## 2023-05-08 NOTE — Assessment & Plan Note (Signed)
Discussed options for birth control.  After discussion, patient opted for Nexplanon.  Urine pregnancy test negative today.  Last unprotected intercourse yesterday.  Patient will follow-up in several weeks for Nexplanon placement.

## 2023-05-13 NOTE — Telephone Encounter (Signed)
Attempted to call patient, no answer, no vm set up  Latrelle Dodrill, MD

## 2023-05-20 ENCOUNTER — Ambulatory Visit: Payer: MEDICAID | Admitting: Family Medicine

## 2023-05-24 DIAGNOSIS — F4310 Post-traumatic stress disorder, unspecified: Secondary | ICD-10-CM | POA: Diagnosis not present

## 2023-05-24 DIAGNOSIS — Z419 Encounter for procedure for purposes other than remedying health state, unspecified: Secondary | ICD-10-CM | POA: Diagnosis not present

## 2023-05-28 DIAGNOSIS — H5213 Myopia, bilateral: Secondary | ICD-10-CM | POA: Diagnosis not present

## 2023-05-29 DIAGNOSIS — F4310 Post-traumatic stress disorder, unspecified: Secondary | ICD-10-CM | POA: Diagnosis not present

## 2023-06-02 ENCOUNTER — Other Ambulatory Visit: Payer: Self-pay | Admitting: Family Medicine

## 2023-06-05 DIAGNOSIS — F4310 Post-traumatic stress disorder, unspecified: Secondary | ICD-10-CM | POA: Diagnosis not present

## 2023-06-07 ENCOUNTER — Ambulatory Visit: Payer: MEDICAID | Admitting: Student

## 2023-06-07 NOTE — Progress Notes (Deleted)
    SUBJECTIVE:   CHIEF COMPLAINT / HPI:   HSV Outbreak  -Ongoing *** -Painful *** -Denies fever or chills *** -STI testing *** -Contraception  PERTINENT  PMH / PSH: ***  OBJECTIVE:   There were no vitals taken for this visit.  General: Alert and oriented in no apparent distress Heart: Regular rate and rhythm with no murmurs appreciated Lungs: CTA bilaterally, no wheezing Abdomen: Bowel sounds present, no abdominal pain Skin: Warm and dry Extremities: No lower extremity edema   ASSESSMENT/PLAN:   No problem-specific Assessment & Plan notes found for this encounter.     Yesenia Martinez, MD [facility]

## 2023-06-11 ENCOUNTER — Encounter: Payer: Self-pay | Admitting: Family Medicine

## 2023-06-11 ENCOUNTER — Ambulatory Visit (INDEPENDENT_AMBULATORY_CARE_PROVIDER_SITE_OTHER): Payer: Medicaid Other | Admitting: Family Medicine

## 2023-06-11 ENCOUNTER — Other Ambulatory Visit: Payer: Self-pay | Admitting: Family Medicine

## 2023-06-11 VITALS — BP 126/80 | HR 78 | Ht 65.5 in | Wt 179.4 lb

## 2023-06-11 DIAGNOSIS — H0019 Chalazion unspecified eye, unspecified eyelid: Secondary | ICD-10-CM | POA: Diagnosis not present

## 2023-06-11 DIAGNOSIS — Z3009 Encounter for other general counseling and advice on contraception: Secondary | ICD-10-CM | POA: Diagnosis not present

## 2023-06-11 DIAGNOSIS — Z803 Family history of malignant neoplasm of breast: Secondary | ICD-10-CM

## 2023-06-11 DIAGNOSIS — R519 Headache, unspecified: Secondary | ICD-10-CM | POA: Diagnosis not present

## 2023-06-11 DIAGNOSIS — Z0000 Encounter for general adult medical examination without abnormal findings: Secondary | ICD-10-CM

## 2023-06-11 DIAGNOSIS — A6000 Herpesviral infection of urogenital system, unspecified: Secondary | ICD-10-CM

## 2023-06-11 MED ORDER — VALACYCLOVIR HCL 500 MG PO TABS: 500.0000 mg | ORAL_TABLET | Freq: Two times a day (BID) | ORAL | 0 refills | Status: DC

## 2023-06-11 MED ORDER — TOPIRAMATE 25 MG PO TABS: 25.0000 mg | ORAL_TABLET | Freq: Every day | ORAL | 1 refills | Status: DC

## 2023-06-11 NOTE — Progress Notes (Unsigned)
  Date of Visit: 06/11/2023   SUBJECTIVE:   HPI:  Yesenia Weeks presents today for follow-up.  She has multiple items she wants to discuss today.  Contraception: Still has not come in for Nexplanon placement.  Last had intercourse 2 days ago, unprotected.  She is very wary of the possibility of being in early pregnancy and potentially exposing herself to Nexplanon.  Breast pain: Has pain on her right breast intermittently.  She is not sure exactly how often it has been happening, but it has happened for the last several months.  The pain did precede her last mammogram, which was in June and was normal.  Has family history of breast cancer, previously referred to genetics but never went, would like to do this again.  Headache: For a couple of months has had a headache about 1-2 times a week.  Last hours at a time.  Has taken Tylenol and ibuprofen without much help.  Located in the front of her forehead, above her eyes.  Nothing seems to make it better or worse.  She got new glasses 1 month ago, but this has not made a difference.  No vomiting.  Genital herpes: Currently having outbreak, requests antiviral prescription.  Stye: For several weeks has had bump on her eyelid.  It is not painful anymore.  Allergies: Requests refill of loratadine.  This works well for her.  Colonoscopy: Request phone number so she can call and schedule colonoscopy for screening purposes.   OBJECTIVE:   BP 126/80   Pulse 78   Ht 5' 5.5" (1.664 m)   Wt 179 lb 6.4 oz (81.4 kg)   SpO2 100%   BMI 29.40 kg/m  Gen: No acute distress, pleasant, cooperative, well-appearing HEENT: Normocephalic, atraumatic. Pupils equal round and reactive to light. +hardened area along upper lid line of one eye consistent with chalazion. No drainage or erythema. Heart: Regular rate and rhythm, no murmur Lungs: Clear to auscultation bilaterally, normal effort Neuro: cranial nerves II-XII tested and intact. Full strength bilateral upper and  lower extremities.  Sensation intact over bilateral upper and lower extremities.  Normal finger-nose-finger testing bilaterally. Ext: No edema Breasts: bilateral breasts normal in appearance. No erythema, deformity, or nipple discharge. No palpable abnormal masses. No axillary lymphadenopathy. Chaperone present - Yesenia Weeks  ASSESSMENT/PLAN:   Assessment & Plan Nonintractable episodic headache, unspecified headache type Rx for Topamax sent in as preventive medication Normal neuroexam today, reassured by headache not being every day Follow-up to monitor for improvement Genital herpes simplex, unspecified site Rx for Valtrex given Routine adult health maintenance Provided phone number for her to schedule with [facility] for screening colonoscopy Family history of breast cancer Refer to genetic counseling Normal breast exam today, I am not as concerned about the intermittent breast pain she has been reporting given her recent normal mammogram Encounter for other general counseling or advice on contraception She will schedule to have Nexplanon placed Chalazion, unspecified laterality Consistent with chalazion.  Discussed warm compresses.  Given handout.  If not improving could refer to ophthalmology for further management.    [place] J. Pollie Meyer, MD [facility] Family Medicine

## 2023-06-11 NOTE — Patient Instructions (Addendum)
It was great to see you again today.  Breast exam is normal today Follow up if pain worsens or you notice any other changes Referring to genetic counseling  See handout on your eye below  Schedule nexplanon placement  Sent in claritin Sent in valtrex for outbreak  [facility]: [phone]   Be well, Dr. Pollie Meyer  Chalazion  A chalazion is a swelling or lump on the eyelid. It can affect the upper eyelid or the lower eyelid. What are the causes? This condition may be caused by: Long-lasting (chronic) inflammation of the eyelid glands. A blocked oil gland in the eyelid. What are the signs or symptoms? Symptoms of this condition include: Swelling of the eyelid that: May spread to areas around the eye. May be painful. A hard lump on the eyelid. Blurry vision. The lump may make it hard to see out of the eye. How is this diagnosed? This condition is diagnosed with an examination of the eye. How is this treated? This condition is treated by applying a warm, moist cloth (warm compress) to the eyelid. If the condition does not improve, it may be treated with: Medicine that is applied to the eye. Oral medicines. Medicine that is injected into the chalazion. Surgery. Follow these instructions at home: Managing pain and swelling Apply a warm compress to the eyelid for 10-15 minutes, 4 to 6 times a day. This will help to open any blocked glands and to reduce redness and swelling. Take and apply over-the-counter and prescription medicines only as told by your health care provider. General instructions Do not touch the chalazion. Do not try to remove the pus. Do not squeeze the chalazion or stick it with a pin or needle. Do not rub your eyes. Wash your hands often with soap and water for at least 20 seconds. Dry your hands with a clean towel. Keep your face, scalp, and eyebrows clean. Avoid wearing eye makeup. Keep all follow-up visits. This is important. Contact a  health care provider if: Your eyelid is getting worse. You have a fever. The chalazion does not break open (rupture) or go away on its own and your eyelid has not improved for 4 weeks. Get help right away if: You have pain in your eye. Your vision worsens. The chalazion becomes painful or red. The chalazion gets bigger. Summary A chalazion is a swelling or lump on the upper or lower eyelid. It may be caused by chronic inflammation or a blocked oil gland. Apply a warm compress to the eyelid for 10-15 minutes, 4 to 6 times a day. Keep your face, scalp, and eyebrows clean. This information is not intended to replace advice given to you by your health care provider. Make sure you discuss any questions you have with your health care provider. Document Revised: 09/14/2020 Document Reviewed: 09/14/2020 Elsevier Patient Education  2024 [employer].

## 2023-06-12 DIAGNOSIS — F4310 Post-traumatic stress disorder, unspecified: Secondary | ICD-10-CM | POA: Diagnosis not present

## 2023-06-13 DIAGNOSIS — R519 Headache, unspecified: Secondary | ICD-10-CM | POA: Insufficient documentation

## 2023-06-13 NOTE — Assessment & Plan Note (Signed)
Provided phone number for her to schedule with [facility] for screening colonoscopy

## 2023-06-13 NOTE — Assessment & Plan Note (Signed)
Rx for Topamax sent in as preventive medication Normal neuroexam today, reassured by headache not being every day Follow-up to monitor for improvement

## 2023-06-13 NOTE — Assessment & Plan Note (Signed)
 Rx for Valtrex given.

## 2023-06-13 NOTE — Assessment & Plan Note (Signed)
Refer to genetic counseling Normal breast exam today, I am not as concerned about the intermittent breast pain she has been reporting given her recent normal mammogram

## 2023-06-13 NOTE — Assessment & Plan Note (Signed)
She will schedule to have Nexplanon placed

## 2023-06-14 ENCOUNTER — Other Ambulatory Visit: Payer: Self-pay

## 2023-06-14 MED ORDER — LORATADINE 10 MG PO TABS: 10.0000 mg | ORAL_TABLET | Freq: Every day | ORAL | 3 refills | Status: DC

## 2023-06-19 DIAGNOSIS — F4310 Post-traumatic stress disorder, unspecified: Secondary | ICD-10-CM | POA: Diagnosis not present

## 2023-06-21 DIAGNOSIS — F4310 Post-traumatic stress disorder, unspecified: Secondary | ICD-10-CM | POA: Diagnosis not present

## 2023-06-23 DIAGNOSIS — Z419 Encounter for procedure for purposes other than remedying health state, unspecified: Secondary | ICD-10-CM | POA: Diagnosis not present

## 2023-06-26 DIAGNOSIS — F4310 Post-traumatic stress disorder, unspecified: Secondary | ICD-10-CM | POA: Diagnosis not present

## 2023-07-03 DIAGNOSIS — F4310 Post-traumatic stress disorder, unspecified: Secondary | ICD-10-CM | POA: Diagnosis not present

## 2023-07-10 DIAGNOSIS — F4310 Post-traumatic stress disorder, unspecified: Secondary | ICD-10-CM | POA: Diagnosis not present

## 2023-07-11 DIAGNOSIS — F4310 Post-traumatic stress disorder, unspecified: Secondary | ICD-10-CM | POA: Diagnosis not present

## 2023-07-18 DIAGNOSIS — F4310 Post-traumatic stress disorder, unspecified: Secondary | ICD-10-CM | POA: Diagnosis not present

## 2023-07-19 DIAGNOSIS — F4310 Post-traumatic stress disorder, unspecified: Secondary | ICD-10-CM | POA: Diagnosis not present

## 2023-07-24 DIAGNOSIS — Z419 Encounter for procedure for purposes other than remedying health state, unspecified: Secondary | ICD-10-CM | POA: Diagnosis not present

## 2023-07-25 DIAGNOSIS — F4310 Post-traumatic stress disorder, unspecified: Secondary | ICD-10-CM | POA: Diagnosis not present

## 2023-07-26 DIAGNOSIS — F4310 Post-traumatic stress disorder, unspecified: Secondary | ICD-10-CM | POA: Diagnosis not present

## 2023-07-31 DIAGNOSIS — F4310 Post-traumatic stress disorder, unspecified: Secondary | ICD-10-CM | POA: Diagnosis not present

## 2023-08-01 DIAGNOSIS — F4310 Post-traumatic stress disorder, unspecified: Secondary | ICD-10-CM | POA: Diagnosis not present

## 2023-08-07 DIAGNOSIS — F4310 Post-traumatic stress disorder, unspecified: Secondary | ICD-10-CM | POA: Diagnosis not present

## 2023-08-08 DIAGNOSIS — F4310 Post-traumatic stress disorder, unspecified: Secondary | ICD-10-CM | POA: Diagnosis not present

## 2023-08-11 DIAGNOSIS — F4310 Post-traumatic stress disorder, unspecified: Secondary | ICD-10-CM | POA: Diagnosis not present

## 2023-08-15 DIAGNOSIS — F4310 Post-traumatic stress disorder, unspecified: Secondary | ICD-10-CM | POA: Diagnosis not present

## 2023-08-19 DIAGNOSIS — F4310 Post-traumatic stress disorder, unspecified: Secondary | ICD-10-CM | POA: Diagnosis not present

## 2023-08-20 DIAGNOSIS — F4310 Post-traumatic stress disorder, unspecified: Secondary | ICD-10-CM | POA: Diagnosis not present

## 2023-08-21 DIAGNOSIS — F4310 Post-traumatic stress disorder, unspecified: Secondary | ICD-10-CM | POA: Diagnosis not present

## 2023-08-24 DIAGNOSIS — Z419 Encounter for procedure for purposes other than remedying health state, unspecified: Secondary | ICD-10-CM | POA: Diagnosis not present

## 2023-08-27 DIAGNOSIS — F4310 Post-traumatic stress disorder, unspecified: Secondary | ICD-10-CM | POA: Diagnosis not present

## 2023-08-28 DIAGNOSIS — F4310 Post-traumatic stress disorder, unspecified: Secondary | ICD-10-CM | POA: Diagnosis not present

## 2023-08-29 DIAGNOSIS — F4310 Post-traumatic stress disorder, unspecified: Secondary | ICD-10-CM | POA: Diagnosis not present

## 2023-08-30 DIAGNOSIS — F4310 Post-traumatic stress disorder, unspecified: Secondary | ICD-10-CM | POA: Diagnosis not present

## 2023-08-31 DIAGNOSIS — F4310 Post-traumatic stress disorder, unspecified: Secondary | ICD-10-CM | POA: Diagnosis not present

## 2023-09-03 DIAGNOSIS — F4310 Post-traumatic stress disorder, unspecified: Secondary | ICD-10-CM | POA: Diagnosis not present

## 2023-09-04 DIAGNOSIS — F4310 Post-traumatic stress disorder, unspecified: Secondary | ICD-10-CM | POA: Diagnosis not present

## 2023-09-05 DIAGNOSIS — F4310 Post-traumatic stress disorder, unspecified: Secondary | ICD-10-CM | POA: Diagnosis not present

## 2023-09-06 DIAGNOSIS — F4310 Post-traumatic stress disorder, unspecified: Secondary | ICD-10-CM | POA: Diagnosis not present

## 2023-09-07 DIAGNOSIS — F4310 Post-traumatic stress disorder, unspecified: Secondary | ICD-10-CM | POA: Diagnosis not present

## 2023-09-08 DIAGNOSIS — F4310 Post-traumatic stress disorder, unspecified: Secondary | ICD-10-CM | POA: Diagnosis not present

## 2023-09-09 DIAGNOSIS — F4310 Post-traumatic stress disorder, unspecified: Secondary | ICD-10-CM | POA: Diagnosis not present

## 2023-09-11 DIAGNOSIS — F4310 Post-traumatic stress disorder, unspecified: Secondary | ICD-10-CM | POA: Diagnosis not present

## 2023-09-15 DIAGNOSIS — F4310 Post-traumatic stress disorder, unspecified: Secondary | ICD-10-CM | POA: Diagnosis not present

## 2023-09-17 DIAGNOSIS — F4310 Post-traumatic stress disorder, unspecified: Secondary | ICD-10-CM | POA: Diagnosis not present

## 2023-09-21 DIAGNOSIS — Z419 Encounter for procedure for purposes other than remedying health state, unspecified: Secondary | ICD-10-CM | POA: Diagnosis not present

## 2023-09-22 DIAGNOSIS — F4310 Post-traumatic stress disorder, unspecified: Secondary | ICD-10-CM | POA: Diagnosis not present

## 2023-09-23 DIAGNOSIS — F4310 Post-traumatic stress disorder, unspecified: Secondary | ICD-10-CM | POA: Diagnosis not present

## 2023-09-24 DIAGNOSIS — F4310 Post-traumatic stress disorder, unspecified: Secondary | ICD-10-CM | POA: Diagnosis not present

## 2023-09-29 DIAGNOSIS — F4310 Post-traumatic stress disorder, unspecified: Secondary | ICD-10-CM | POA: Diagnosis not present

## 2023-09-30 ENCOUNTER — Other Ambulatory Visit: Payer: Self-pay | Admitting: Family Medicine

## 2023-09-30 DIAGNOSIS — F4310 Post-traumatic stress disorder, unspecified: Secondary | ICD-10-CM | POA: Diagnosis not present

## 2023-10-01 DIAGNOSIS — F4310 Post-traumatic stress disorder, unspecified: Secondary | ICD-10-CM | POA: Diagnosis not present

## 2023-10-02 DIAGNOSIS — F4310 Post-traumatic stress disorder, unspecified: Secondary | ICD-10-CM | POA: Diagnosis not present

## 2023-10-07 DIAGNOSIS — F4310 Post-traumatic stress disorder, unspecified: Secondary | ICD-10-CM | POA: Diagnosis not present

## 2023-10-09 DIAGNOSIS — F4310 Post-traumatic stress disorder, unspecified: Secondary | ICD-10-CM | POA: Diagnosis not present

## 2023-10-13 DIAGNOSIS — F4310 Post-traumatic stress disorder, unspecified: Secondary | ICD-10-CM | POA: Diagnosis not present

## 2023-10-17 DIAGNOSIS — F4310 Post-traumatic stress disorder, unspecified: Secondary | ICD-10-CM | POA: Diagnosis not present

## 2023-10-22 DIAGNOSIS — F4310 Post-traumatic stress disorder, unspecified: Secondary | ICD-10-CM | POA: Diagnosis not present

## 2023-10-27 DIAGNOSIS — F4310 Post-traumatic stress disorder, unspecified: Secondary | ICD-10-CM | POA: Diagnosis not present

## 2023-10-28 DIAGNOSIS — F4310 Post-traumatic stress disorder, unspecified: Secondary | ICD-10-CM | POA: Diagnosis not present

## 2023-10-30 DIAGNOSIS — F4310 Post-traumatic stress disorder, unspecified: Secondary | ICD-10-CM | POA: Diagnosis not present

## 2023-11-02 DIAGNOSIS — Z419 Encounter for procedure for purposes other than remedying health state, unspecified: Secondary | ICD-10-CM | POA: Diagnosis not present

## 2023-11-03 DIAGNOSIS — F4310 Post-traumatic stress disorder, unspecified: Secondary | ICD-10-CM | POA: Diagnosis not present

## 2023-11-05 DIAGNOSIS — F4310 Post-traumatic stress disorder, unspecified: Secondary | ICD-10-CM | POA: Diagnosis not present

## 2023-11-12 DIAGNOSIS — F4310 Post-traumatic stress disorder, unspecified: Secondary | ICD-10-CM | POA: Diagnosis not present

## 2023-11-13 ENCOUNTER — Other Ambulatory Visit: Payer: Self-pay | Admitting: Family Medicine

## 2023-11-13 DIAGNOSIS — F4310 Post-traumatic stress disorder, unspecified: Secondary | ICD-10-CM | POA: Diagnosis not present

## 2023-11-14 DIAGNOSIS — F4310 Post-traumatic stress disorder, unspecified: Secondary | ICD-10-CM | POA: Diagnosis not present

## 2023-11-20 DIAGNOSIS — F4310 Post-traumatic stress disorder, unspecified: Secondary | ICD-10-CM | POA: Diagnosis not present

## 2023-11-21 DIAGNOSIS — F4310 Post-traumatic stress disorder, unspecified: Secondary | ICD-10-CM | POA: Diagnosis not present

## 2023-11-24 DIAGNOSIS — F4310 Post-traumatic stress disorder, unspecified: Secondary | ICD-10-CM | POA: Diagnosis not present

## 2023-11-25 DIAGNOSIS — F4310 Post-traumatic stress disorder, unspecified: Secondary | ICD-10-CM | POA: Diagnosis not present

## 2023-11-27 ENCOUNTER — Telehealth: Payer: Self-pay

## 2023-11-27 DIAGNOSIS — R519 Headache, unspecified: Secondary | ICD-10-CM

## 2023-12-01 DIAGNOSIS — F4310 Post-traumatic stress disorder, unspecified: Secondary | ICD-10-CM | POA: Diagnosis not present

## 2023-12-02 DIAGNOSIS — F4310 Post-traumatic stress disorder, unspecified: Secondary | ICD-10-CM | POA: Diagnosis not present

## 2023-12-02 DIAGNOSIS — Z419 Encounter for procedure for purposes other than remedying health state, unspecified: Secondary | ICD-10-CM | POA: Diagnosis not present

## 2023-12-03 DIAGNOSIS — F4310 Post-traumatic stress disorder, unspecified: Secondary | ICD-10-CM | POA: Diagnosis not present

## 2023-12-09 ENCOUNTER — Telehealth: Payer: Self-pay | Admitting: *Deleted

## 2023-12-09 NOTE — Progress Notes (Signed)
 Complex Care Management Note  Care Guide Note 12/09/2023 Name: Yesenia Weeks MRN: [id number] DOB: [birth year]  [name] is a 48 y.o. year old female who sees Dawn Eth, Blossom Burkes, MD for primary care. I reached out to Mava S Knabe by phone today to offer complex care management services.  [name] was given information about Complex Care Management services today including:   The Complex Care Management services include support from the care team which includes your Nurse Care Manager, Clinical Social Worker, or Pharmacist.  The Complex Care Management team is here to help remove barriers to the health concerns and goals most important to you. Complex Care Management services are voluntary, and the patient may decline or stop services at any time by request to their care team member.   Complex Care Management Consent Status: Patient agreed to services and verbal consent obtained.   Follow up plan:  Telephone appointment with complex care management team member scheduled for:  5/20  Encounter Outcome:  Patient Scheduled  Barnie Bora  [facility]  [facility] Guide  Direct Dial: [phone]  Fax [phone]

## 2023-12-10 ENCOUNTER — Telehealth: Payer: Self-pay

## 2023-12-10 DIAGNOSIS — F4310 Post-traumatic stress disorder, unspecified: Secondary | ICD-10-CM | POA: Diagnosis not present

## 2023-12-11 DIAGNOSIS — F4310 Post-traumatic stress disorder, unspecified: Secondary | ICD-10-CM | POA: Diagnosis not present

## 2023-12-14 DIAGNOSIS — F4310 Post-traumatic stress disorder, unspecified: Secondary | ICD-10-CM | POA: Diagnosis not present

## 2023-12-17 DIAGNOSIS — F4310 Post-traumatic stress disorder, unspecified: Secondary | ICD-10-CM | POA: Diagnosis not present

## 2023-12-23 ENCOUNTER — Encounter: Admitting: Family Medicine

## 2023-12-24 DIAGNOSIS — F4310 Post-traumatic stress disorder, unspecified: Secondary | ICD-10-CM | POA: Diagnosis not present

## 2023-12-25 DIAGNOSIS — F4310 Post-traumatic stress disorder, unspecified: Secondary | ICD-10-CM | POA: Diagnosis not present

## 2023-12-31 DIAGNOSIS — F4310 Post-traumatic stress disorder, unspecified: Secondary | ICD-10-CM | POA: Diagnosis not present

## 2024-01-02 DIAGNOSIS — Z419 Encounter for procedure for purposes other than remedying health state, unspecified: Secondary | ICD-10-CM | POA: Diagnosis not present

## 2024-01-02 DIAGNOSIS — F4310 Post-traumatic stress disorder, unspecified: Secondary | ICD-10-CM | POA: Diagnosis not present

## 2024-01-06 DIAGNOSIS — F4310 Post-traumatic stress disorder, unspecified: Secondary | ICD-10-CM | POA: Diagnosis not present

## 2024-01-07 ENCOUNTER — Telehealth: Payer: Self-pay

## 2024-01-07 DIAGNOSIS — F4310 Post-traumatic stress disorder, unspecified: Secondary | ICD-10-CM | POA: Diagnosis not present

## 2024-01-08 DIAGNOSIS — F4310 Post-traumatic stress disorder, unspecified: Secondary | ICD-10-CM | POA: Diagnosis not present

## 2024-01-14 ENCOUNTER — Telehealth: Payer: Self-pay | Admitting: *Deleted

## 2024-01-14 NOTE — Progress Notes (Unsigned)
 Complex Care Management Care Guide Note  01/14/2024 Name: Yesenia Weeks MRN: [id number] DOB: 1976-02-25  Yesenia Weeks is a 48 y.o. year old female who is a primary care patient of Donah Laymon PARAS, MD and is actively engaged with the care management team. I reached out to Gordie S Dia by phone today to assist with re-scheduling  with the RN Case Manager.  Follow up plan: Unsuccessful telephone outreach attempt made.  Harlene Satterfield  [facility]  [facility], [facility] Guide  Direct Dial: [phone]  Fax [phone]

## 2024-01-16 NOTE — Progress Notes (Signed)
 Complex Care Management Care Guide Note  01/16/2024 Name: Kizzie XYLAH EARLY MRN: [id number] DOB: 1975-11-11  Tatelyn S Turrubiates is a 48 y.o. year old female who is a primary care patient of Donah Laymon PARAS, MD and is actively engaged with the care management team. I reached out to Aileena S Mcbain by phone today to assist with re-scheduling  with the RN Case Manager.  Follow up plan: Unsuccessful telephone outreach attempt made. No further outreach attempts will be made at this time. We have been unable to contact the patient to reschedule for complex care management services.   Harlene Satterfield  [facility]  [facility], [facility] Guide  Direct Dial: [phone]  Fax [phone]

## 2024-02-01 DIAGNOSIS — Z419 Encounter for procedure for purposes other than remedying health state, unspecified: Secondary | ICD-10-CM | POA: Diagnosis not present

## 2024-02-17 ENCOUNTER — Ambulatory Visit (INDEPENDENT_AMBULATORY_CARE_PROVIDER_SITE_OTHER)

## 2024-02-17 VITALS — BP 131/75 | HR 80 | Wt 187.2 lb

## 2024-02-17 DIAGNOSIS — F32A Depression, unspecified: Secondary | ICD-10-CM

## 2024-02-17 DIAGNOSIS — Z3042 Encounter for surveillance of injectable contraceptive: Secondary | ICD-10-CM

## 2024-02-17 DIAGNOSIS — R030 Elevated blood-pressure reading, without diagnosis of hypertension: Secondary | ICD-10-CM

## 2024-02-17 LAB — POCT URINE PREGNANCY: Preg Test, Ur: NEGATIVE

## 2024-02-17 MED ORDER — MEDROXYPROGESTERONE ACETATE 150 MG/ML IM SUSY
150.0000 mg | PREFILLED_SYRINGE | Freq: Once | INTRAMUSCULAR | Status: AC
  Administered 2024-02-17: 150 mg via INTRAMUSCULAR

## 2024-02-17 MED ORDER — FLUOXETINE HCL 20 MG PO CAPS: 20.0000 mg | ORAL_CAPSULE | Freq: Every day | ORAL | 0 refills | Status: DC

## 2024-02-17 NOTE — Progress Notes (Signed)
 Patient presents for Depo shot and was given a urine pregnancy test.  Urine pregnancy test was negative.  Depo-Provera  shot administered in [facility].  Patient tolerated it well and was given a reminder card of October 13-27-2025  .Yesenia Weeks, CMA

## 2024-02-17 NOTE — Assessment & Plan Note (Signed)
 PHQ-9 score 8 without current SI but impacting overall well being. Patient with history of 2 prior SI attempts. No current SI and motivation/goals for life. Educated patient on SI precautions. Restarted Prozac  20 mg every day which she has been on previously, but not consistently recently. Discussed importance of taking medication daily and expected benefit in 4-6 weeks. She will reach out to a new counselor for therapy.  Follow up with me in 2 weeks for clinical reassessment

## 2024-02-17 NOTE — Patient Instructions (Signed)
 It was wonderful to see you today.  Please bring ALL of your medications with you to every visit.   Today we talked about:  Start Prozac  20 mg every day. Reach out to a counselor.   Ambulatory blood pressure monitoring tomorrow.  Follow up with me in 2.5 weeks  Annual visit with Dr. Donah in 3 weeks  Thank you for choosing [facility].   Please call [phone] with any questions about today's appointment.  Please arrive at least 15 minutes prior to your scheduled appointments.   If you need additional refills before your next appointment, please call your pharmacy first.   Yesenia Weeks Alena Morrison, MD  [facility]

## 2024-02-17 NOTE — Progress Notes (Signed)
    SUBJECTIVE:   CHIEF COMPLAINT / HPI: Depoprovera/depression  Desires depoprovera injection today. LMP 02/13/24  Depression She reports that she feels very depressed recently.  Attempted suicide by overdose two months ago. One prior SI attempt 20 years ago. She is overwhelmed by caring for her young sons with autism and reports things with her marriage are currently rocky. Previously in counseling but not recently. She does feel safe at home. She denies current SI. She has a good support system from her older children and parents. She endorses motivation to live and care for her young children. Previously seen psychiatry and prescribed prozac  20 mg but has not been taking regularly.  HTN Multiple elevated Bps, but not consistently Endorsing headaches intermittently for a year that respond to ibuprofen  but not tylenol  She has blurry vision but attributes that to not having her glasses.  PERTINENT  PMH / PSH: anxiety, depression, history of substance use  OBJECTIVE:   BP (!) 145/92   Pulse 80   Wt 187 lb 3.2 oz (84.9 kg)   SpO2 100%   BMI 30.68 kg/m   General: Well appearing and alert patient. No acute distress.  Cardiac: Heart rate and rhythm are normal. No murmurs, gallops, or rubs are auscultated. S1 and S2 are heard and are of normal intensity. Respiratory: Lung sounds are clear in all lobes bilaterally without rales, ronchi, or wheezes. No increased work of breathing. Mental: Thoughts linear and goal oriented. Affect appropriately tearful. No SI/HI.    ASSESSMENT/PLAN:   Assessment & Plan Depression, unspecified depression type PHQ-9 score 8 without current SI but impacting overall well being. Patient with history of 2 prior SI attempts. No current SI and motivation/goals for life. Educated patient on SI precautions. Restarted Prozac  20 mg every day which she has been on previously, but not consistently recently. Discussed importance of taking medication daily and  expected benefit in 4-6 weeks. She will reach out to a new counselor for therapy.  Follow up with me in 2 weeks for clinical reassessment Encounter for surveillance of injectable contraceptive UPT negative. Administered depoprovera today.  Elevated blood pressure reading Scheduled for 24 hr BP monitoring    Philipe Laswell Alena Morrison, MD [facility]

## 2024-02-17 NOTE — Assessment & Plan Note (Signed)
 UPT negative. Administered depoprovera today.

## 2024-02-18 ENCOUNTER — Ambulatory Visit: Admitting: Pharmacist

## 2024-03-03 DIAGNOSIS — Z419 Encounter for procedure for purposes other than remedying health state, unspecified: Secondary | ICD-10-CM | POA: Diagnosis not present

## 2024-03-05 ENCOUNTER — Ambulatory Visit: Payer: Self-pay

## 2024-03-05 NOTE — Progress Notes (Deleted)
    SUBJECTIVE:   CHIEF COMPLAINT / HPI:   Overall, she is doing *** She has *** been taking the Prozac 20 mg daily with *** benefit. She has *** been able to get scheduled with a counselor.   PERTINENT  PMH / PSH: depression, anxiety state  OBJECTIVE:   There were no vitals taken for this visit.  ***  ASSESSMENT/PLAN:   Assessment & Plan  PHQ9 score: ; *** SI Continue Prozac 20 mg daily.   Encouraged her to schedule ambulatory BP monitoring on her way out, as she was unable to attend her last appointment.  Follow up as scheduled with Dr. Donah for routine health maintenance on 03/09/24.   Yesenia Mallet Alena Morrison, MD [facility]

## 2024-03-09 ENCOUNTER — Encounter: Admitting: Family Medicine

## 2024-03-20 ENCOUNTER — Other Ambulatory Visit: Payer: Self-pay | Admitting: *Deleted

## 2024-03-24 MED ORDER — VALACYCLOVIR HCL 500 MG PO TABS: 500.0000 mg | ORAL_TABLET | Freq: Two times a day (BID) | ORAL | 0 refills | Status: DC

## 2024-04-02 ENCOUNTER — Ambulatory Visit

## 2024-04-03 DIAGNOSIS — Z419 Encounter for procedure for purposes other than remedying health state, unspecified: Secondary | ICD-10-CM | POA: Diagnosis not present

## 2024-04-06 ENCOUNTER — Ambulatory Visit: Admitting: Pharmacist

## 2024-04-10 ENCOUNTER — Encounter: Payer: Self-pay | Admitting: Pharmacist

## 2024-04-10 ENCOUNTER — Ambulatory Visit: Admitting: Pharmacist

## 2024-04-10 VITALS — BP 146/91 | Wt 190.0 lb

## 2024-04-10 DIAGNOSIS — R030 Elevated blood-pressure reading, without diagnosis of hypertension: Secondary | ICD-10-CM | POA: Diagnosis not present

## 2024-04-10 NOTE — Patient Instructions (Signed)
 Blood Pressure Activity Diary Time Lying down/ Sleeping Walking/ Exercise Stressed/ Angry Headache/ Pain Dizzy  9 AM       10 AM       11 AM       12 PM       1 PM       2 PM       Time Lying down/ Sleeping Walking/ Exercise Stressed/ Angry Headache/ Pain Dizzy  3 PM       4 PM        5 PM       6 PM       7 PM       8 PM       Time Lying down/ Sleeping Walking/ Exercise Stressed/ Angry Headache/ Pain Dizzy  9 PM       10 PM       11 PM       12 AM       1 AM       2 AM       3 AM       Time Lying down/ Sleeping Walking/ Exercise Stressed/ Angry Headache/ Pain Dizzy  4 AM       5 AM       6 AM       7 AM       8 AM       9 AM       10 AM        Time you woke up: _________                  Time you went to sleep:__________  Come back Monday to return the monitor   Call the [facility] if you have any questions before then [phone])  Wearing the Blood Pressure Monitor The cuff will inflate every 20 minutes during the day and every 30 minutes while you sleep. Fill out the blood pressure-activity diary during the day, especially during activities that may affect your reading -- such as exercise, stress, walking, taking your blood pressure medications  Important things to know: Avoid taking the monitor off for the next 24 hours, unless it causes you discomfort or pain. Do NOT get the monitor wet and do NOT try to clean the monitor with any cleaning products. Do NOT put the monitor on anyone else's arm. When the cuff inflates, avoid excess movement. Let the cuffed arm hang loosely, slightly away from the body. Avoid flexing the muscles or moving the hand/fingers. Remember to fill out the blood pressure activity diary. If you experience severe pain or unusual pain (not associated with getting your blood pressure checked), remove the monitor.  Troubleshooting:  Code  Troubleshooting   1  Check cuff position, tighten cuff   2, 3  Remain still during reading    4, 87  Check air hose connections and make sure cuff is tight   85, 89  Check hose connections and make tubing is not crimped   86  Push START/STOP to restart reading   88, 91  Retry by pushing START/STOP   90  Replace batteries. If problem persists, remove monitor and bring back to   clinic at follow up   97, 98, 99  Service required - Remove monitor and bring back to clinic at follow up

## 2024-04-10 NOTE — Progress Notes (Signed)
   S:     Chief Complaint  Patient presents with   Medication Management    Amb bp day 1   48 y.o. female who presents for hypertension evaluation, education, and management. Patient arrives in good spirits and presents without any assistance. Patient is accompanied by son.    Patient was referred and last seen by Primary Care Provider, Dr. Alena, on 02/17/24.  At last visit, found to have multiple elevated blood pressures that were inconsistent.   PMH is significant for depression, elevated blood pressure, history of substance abuse, and headaches.   Discussed procedure for wearing the monitor and gave patient written instructions. Monitor was placed on non-dominant arm with instructions to return in the morning.   Antihypertensive in the past: Enalapril  5 mg (after pregnancy)  Dietary habits include: 1 meal a day with snacking in between (mainly lunch) Chips, pretzels, bananas, apples, cheese sticks  O:  Review of Systems  Musculoskeletal:  Positive for joint pain (left ankle).  All other systems reviewed and are negative.   Physical Exam Constitutional:      Appearance: Normal appearance.  Pulmonary:     Effort: Pulmonary effort is normal.  Neurological:     Mental Status: She is alert.  Psychiatric:        Mood and Affect: Mood normal.        Behavior: Behavior normal.        Thought Content: Thought content normal.        Judgment: Judgment normal.     Last 3 Office BP readings: BP Readings from Last 3 Encounters:  02/17/24 131/75  06/11/23 126/80  05/06/23 (!) 136/90    Clinical Atherosclerotic Cardiovascular Disease (ASCVD): No  The 10-year ASCVD risk score (Arnett DK, et al., 2019) is: 1%   Values used to calculate the score:     Age: 9 years     Clincally relevant sex: Female     Is Non-Hispanic African American: Yes     Diabetic: No     Tobacco smoker: No     Systolic Blood Pressure: 131 mmHg     Is BP treated: No     HDL Cholesterol: 61  mg/dL     Total Cholesterol: 155 mg/dL  Basic Metabolic Panel    Component Value Date/Time   NA 139 01/10/2023 1645   K 3.7 01/10/2023 1645   CL 105 01/10/2023 1645   CO2 22 01/10/2023 1645   GLUCOSE 80 01/10/2023 1645   GLUCOSE 71 12/03/2019 1730   BUN 13 01/10/2023 1645   CREATININE 0.76 01/10/2023 1645   CREATININE 0.90 03/31/2014 1222   CALCIUM  9.2 01/10/2023 1645   GFRNONAA 91 09/01/2020 1026   GFRAA 104 09/01/2020 1026    ABPM Study Data: Arm Placement left arm  For Office Goal BP of <130/80 mmHg:  ABPM thresholds: Overall BP <125/75 mmHg, daytime BP <130/80 mmHg, sleeptime BP <110/65 mmHg    A/P: History of recent elevated blood pressure with goal presssure of <130/80 mm Hg.     -Placed blood pressure cuff, provided education, patient instructed to wear cuff for 24 hours and return Monday to review results.   Written patient instructions provided including activity/symptom/event log. Patient verbalized understanding of plan. Total time in face to face counseling 27 minutes.    Follow-up: Monday - early morning after appointment with PCP  Patient seen with Fonda Blase, PharmD Candidate - PY3 student and Estefana Blase, PharmD Candidate - PY4 student.

## 2024-04-10 NOTE — Assessment & Plan Note (Signed)
 History of recent elevated blood pressure with goal presssure of <130/80 mm Hg.     -Placed blood pressure cuff, provided education, patient instructed to wear cuff for 24 hours and return Monday to review results.

## 2024-04-10 NOTE — Progress Notes (Signed)
 Reviewed and agree with Dr Rennis plan.

## 2024-04-13 ENCOUNTER — Ambulatory Visit (INDEPENDENT_AMBULATORY_CARE_PROVIDER_SITE_OTHER)

## 2024-04-13 ENCOUNTER — Telehealth: Payer: Self-pay | Admitting: Pharmacist

## 2024-04-13 VITALS — BP 119/88 | HR 86 | Ht 65.0 in | Wt 189.0 lb

## 2024-04-13 DIAGNOSIS — F32A Depression, unspecified: Secondary | ICD-10-CM

## 2024-04-13 DIAGNOSIS — Z23 Encounter for immunization: Secondary | ICD-10-CM | POA: Diagnosis not present

## 2024-04-13 DIAGNOSIS — R5383 Other fatigue: Secondary | ICD-10-CM | POA: Diagnosis not present

## 2024-04-13 MED ORDER — ESCITALOPRAM OXALATE 10 MG PO TABS: 10.0000 mg | ORAL_TABLET | Freq: Every day | ORAL | 0 refills | Status: DC

## 2024-04-13 NOTE — Patient Instructions (Addendum)
 Your goal is to listen music on Youtube for 30 minutes - 1 hour on Wednesday around 12:45pm before you pick up the boys.   Lab work - CBC (blood count), TSH (thyroid ), vitamin B12; I will call you if this are abnormal  Follow up with Dr. Donah in 2-3 weeks. You can make this appointment at check out.    Psychiatry Resource List (Adults and Children) Most of these providers will take Medicaid. please consult your insurance for a complete and updated list of available providers. When calling to make an appointment have your insurance information available to confirm you are covered.   [facility]:Psychiatry and Counseling [address] [place], [place] [address] [phone]  [facility]  [address] [place], [place] [phone] Crisis [phone]   [facility]:   [facility]: [address]     [phone]   [place]: [address] [place]. [place],        [phone] [place]: [address] Suite [phone],    [phone] [place]: [phone] [address],                   [phone] Children: [facility] [address]         [phone]  [facility] (virtual only) [phone]   [facility]  (Psychiatry only; Adults /children 12 and over, will take Medicaid)  [address] [place] [address], [place], [place] [address]       [phone]   [facility] (Psychiatry & counseling ; adults & children ; will take Medicaid [address]  [place] [address]  Go on-line to complete referral ( [email or web address] [phone]    (Spanish speaking therapists)  [facility]  Psychiatry & counseling; Adults and children;  Call Registration prior to scheduling an appointment [phone] [address]    [place], [place] [address]    [phone]  [facility] (Psychiatry & counseling; adults & children; Medicare no Medicaid)  [address]   [place], [place]  [address]      [phone]    [facility] (up to age 54)  Psychiatry & counseling ,will take Medicaid, must do counseling to receive psychiatry services  [address] [place] [address]        [phone]  [facility] (Psychiatry & counseling; adults & children; will take Medicaid) Will need a referral from provider [address],  [place], [place]  [phone]   [facility] --- Walk-In Mon-Friday 8am-3pm ( will take Medicaid, Psychiatry, Adults & children,  [address] [place], [place]   [phone]   [facility], Walk-in M-F 8am-12pm and 1pm -3pm   (Counseling, Psychiatry, will take Medicaid, adults & children)  [address], [place], [place]  [phone]

## 2024-04-13 NOTE — Telephone Encounter (Signed)
 Reviewed and agree with Dr Rennis plan.

## 2024-04-13 NOTE — Telephone Encounter (Signed)
 Patient contacted for follow-up of Amb Blood Pressure monitor, placed 9/19 and worn the remainder of the day 9/19  Patient reports pain and itching from the Blood Pressure cuff which became intolerable by late PM on 9/19, day of placement.     Current Medications include: None for hypertension.  Reports strong family history of stroke in mother (3 stroke - survivor)  O:  Review of Systems  Neurological:  Positive for headaches.    Last 3 Office BP readings: BP Readings from Last 3 Encounters:  04/13/24 119/88  04/10/24 (!) 146/91  02/17/24 131/75    Clinical Atherosclerotic Cardiovascular Disease (ASCVD):  The 10-year ASCVD risk score (Arnett DK, et al., 2019) is: 0.7%   Values used to calculate the score:     Age: 48 years     Clincally relevant sex: Female     Is Non-Hispanic African American: Yes     Diabetic: No     Tobacco smoker: No     Systolic Blood Pressure: 119 mmHg     Is BP treated: No     HDL Cholesterol: 61 mg/dL     Total Cholesterol: 155 mg/dL  Basic Metabolic Panel    Component Value Date/Time   NA 139 01/10/2023 1645   K 3.7 01/10/2023 1645   CL 105 01/10/2023 1645   CO2 22 01/10/2023 1645   GLUCOSE 80 01/10/2023 1645   GLUCOSE 71 12/03/2019 1730   BUN 13 01/10/2023 1645   CREATININE 0.76 01/10/2023 1645   CREATININE 0.90 03/31/2014 1222   CALCIUM  9.2 01/10/2023 1645   GFRNONAA 91 09/01/2020 1026   GFRAA 104 09/01/2020 1026     ABPM Study Data: Arm Placement left arm  Overall Mean 24hr BP:   139/84 mmHg  HR: 86  Daytime Mean BP:  139/84 mmHg  HR: 86  Nighttime Mean BP:  N/A mmHg  HR: N/A  Dipping Pattern: N/A   For Office Goal BP of <130/80 mmHg:  ABPM thresholds: Overall BP <125/75 mmHg, daytime BP <130/80 mmHg, sleeptime BP <110/65 mmHg   A/P: History of elevated blood pressure in office, currently not taking any blood pressure lowering therapy, maternal history of stroke and goal presssure of <130/80 mm Hg.  Found to have  persistently elevated and uncontrolled blood pressure with 24-hour ambulatory blood pressure evaluation which demonstrates an average AWAKE blood pressure of 139/84 mmHg.   Results reviewed and written information provided.   Discussed option of starting therapy but deferred to next PCP with Dr. Donah in 1 week.   Written patient instructions provided. Patient verbalized understanding of treatment plan.  Total time in counseling and documentation 13 minutes.

## 2024-04-13 NOTE — Progress Notes (Signed)
    SUBJECTIVE:   CHIEF COMPLAINT / HPI: Depression, fatigue  Depression Overall, she is not doing much better. She still has a lot of stressors at home (in marriage, 2 sons with autism spectrum disorder). Experiencing a lot of anhedonia, decreased energy/motivation.  No thoughts of self harm or suicide.  She took prozac  for about a month but noted fatigue and discontinued. She has not been able to get in therapy.   PERTINENT  PMH / PSH: genital herpes, anemia  OBJECTIVE:   BP 119/88   Pulse 86   Ht 5' 5 (1.651 m)   Wt 189 lb (85.7 kg)   SpO2 100%   BMI 31.45 kg/m    General: well appearing, well groomed female CV: regular rate Psych: thoughts are goal oriented. Speech is appropriate. Affect is tearful at times. No SI.   ASSESSMENT/PLAN:   Assessment & Plan Depression, unspecified depression type PHQ9 7 today.  (8 at last visit) Start escitalopram  10 mg every day. Discussed side effects to include somnolence, and patient is aware full effect of medication will not be achieved until 4-6 weeks. Positive activity planning (goal in AVS) Referral to psychology, therapy list provided.  She will follow up with PCP in 2-3 weeks. May be virtual visit. Other fatigue DDX: depression, side effect from SSRI (prozac ), anemia, hypothyroid Labs today: CBC, TSH, vitamin B12 Encounter for immunization Flu vaccine today    Yesenia Weeks Alena Morrison, MD [facility]

## 2024-04-13 NOTE — Assessment & Plan Note (Signed)
 PHQ9 7 today.  (8 at last visit) Start escitalopram  10 mg every day. Discussed side effects to include somnolence, and patient is aware full effect of medication will not be achieved until 4-6 weeks. Positive activity planning (goal in AVS) Referral to psychology, therapy list provided.  She will follow up with PCP in 2-3 weeks. May be virtual visit.

## 2024-04-14 LAB — CBC
Hematocrit: 40.5 % (ref 34.0–46.6)
Hemoglobin: 12.6 g/dL (ref 11.1–15.9)
MCH: 26.3 pg — ABNORMAL LOW (ref 26.6–33.0)
MCHC: 31.1 g/dL — ABNORMAL LOW (ref 31.5–35.7)
MCV: 85 fL (ref 79–97)
Platelets: 288 x10E3/uL (ref 150–450)
RBC: 4.79 x10E6/uL (ref 3.77–5.28)
RDW: 12.2 % (ref 11.7–15.4)
WBC: 10.4 x10E3/uL (ref 3.4–10.8)

## 2024-04-14 LAB — TSH: TSH: 0.909 u[IU]/mL (ref 0.450–4.500)

## 2024-04-14 LAB — VITAMIN B12: Vitamin B-12: 723 pg/mL (ref 232–1245)

## 2024-04-16 ENCOUNTER — Ambulatory Visit: Payer: Self-pay

## 2024-04-20 ENCOUNTER — Encounter: Admitting: Family Medicine

## 2024-04-21 ENCOUNTER — Other Ambulatory Visit: Payer: Self-pay | Admitting: Family Medicine

## 2024-04-21 DIAGNOSIS — Z1231 Encounter for screening mammogram for malignant neoplasm of breast: Secondary | ICD-10-CM

## 2024-04-23 ENCOUNTER — Encounter: Payer: Self-pay | Admitting: Family Medicine

## 2024-04-23 ENCOUNTER — Other Ambulatory Visit (HOSPITAL_COMMUNITY)
Admission: RE | Admit: 2024-04-23 | Discharge: 2024-04-23 | Disposition: A | Discharge status: Home / Self Care | Source: Ambulatory Visit | Attending: Family Medicine | Admitting: Family Medicine

## 2024-04-23 ENCOUNTER — Ambulatory Visit (INDEPENDENT_AMBULATORY_CARE_PROVIDER_SITE_OTHER): Admitting: Family Medicine

## 2024-04-23 ENCOUNTER — Ambulatory Visit: Payer: Self-pay | Admitting: Family Medicine

## 2024-04-23 VITALS — BP 143/102 | HR 73 | Ht 64.5 in | Wt 191.2 lb

## 2024-04-23 DIAGNOSIS — Z124 Encounter for screening for malignant neoplasm of cervix: Secondary | ICD-10-CM | POA: Diagnosis not present

## 2024-04-23 DIAGNOSIS — A6000 Herpesviral infection of urogenital system, unspecified: Secondary | ICD-10-CM

## 2024-04-23 DIAGNOSIS — Z0000 Encounter for general adult medical examination without abnormal findings: Secondary | ICD-10-CM

## 2024-04-23 DIAGNOSIS — R079 Chest pain, unspecified: Secondary | ICD-10-CM

## 2024-04-23 DIAGNOSIS — R0789 Other chest pain: Secondary | ICD-10-CM

## 2024-04-23 DIAGNOSIS — I10 Essential (primary) hypertension: Secondary | ICD-10-CM | POA: Diagnosis not present

## 2024-04-23 DIAGNOSIS — F39 Unspecified mood [affective] disorder: Secondary | ICD-10-CM | POA: Diagnosis not present

## 2024-04-23 DIAGNOSIS — N898 Other specified noninflammatory disorders of vagina: Secondary | ICD-10-CM | POA: Diagnosis not present

## 2024-04-23 DIAGNOSIS — Z0184 Encounter for antibody response examination: Secondary | ICD-10-CM

## 2024-04-23 DIAGNOSIS — M79672 Pain in left foot: Secondary | ICD-10-CM | POA: Diagnosis not present

## 2024-04-23 LAB — POCT WET PREP (WET MOUNT)
Clue Cells Wet Prep Whiff POC: NEGATIVE
Trichomonas Wet Prep HPF POC: ABSENT
WBC, Wet Prep HPF POC: NONE SEEN

## 2024-04-23 MED ORDER — VALACYCLOVIR HCL 500 MG PO TABS: 500.0000 mg | ORAL_TABLET | Freq: Every day | ORAL | 3 refills | Status: AC

## 2024-04-23 MED ORDER — AMLODIPINE BESYLATE 5 MG PO TABS: 5.0000 mg | ORAL_TABLET | Freq: Every day | ORAL | 1 refills | Status: AC

## 2024-04-23 MED ORDER — DICLOFENAC SODIUM 1 % EX GEL: 4.0000 g | Freq: Four times a day (QID) | CUTANEOUS | 1 refills | Status: AC | PRN

## 2024-04-23 NOTE — Patient Instructions (Addendum)
 It was great to see you again today.  Start amlodipine 5mg  daily Schedule nurse BP check in 2 weeks  Sent in refill of voltaren  gel. Try putting this on your chest Go get chest xray Ordering ultrasound of your heart Referring to cardiologist  Sent in daily valtrex  for you Swabbing for vaginal infection Pap smear today  Checking cholesterol and kidney function, and to see if you are immune to hepatitis B  [facility]: [phone] to schedule colonoscopy  Follow up with me in 6 weeks, sooner if needed  Be well, Dr. Donah

## 2024-04-23 NOTE — Progress Notes (Signed)
 Date of Visit: 04/23/2024   SUBJECTIVE:   HPI:  Discussed the use of AI scribe software for clinical note transcription with the patient, who gave verbal consent to proceed.  History of Present Illness Yesenia Weeks is a 48 year old female who presents with multiple symptoms.  Chest pain and dyspnea - Intermittent chest pain for the past year, primarily on the right side, lasting 2-3 minutes per episode - prior EKG June 2024 without ischemic signs - pain is similar to pain reported at that time. Xray was ordered as pain thought to be musculoskeletal but she never got xray due to stress & financial concerns - Pain described as sharp, occurs without exertion - Episodes sometimes accompanied by shortness of breath - No current chest pain or pain with exertion  Herpes simplex virus outbreaks - Monthly outbreaks of herpes simplex virus after years without issues - Outbreaks attributed to stress - desires to start daily suppressive medication   Elevated blood pressure - Elevated blood pressure readings over the past few months - Previously stable blood pressure at 120/80 mmHg  Fatigue and depressive symptoms - Fatigue and low energy levels - Frequent tiredness and sleepiness - Attributes symptoms to possible depression - Not currently taking Prozac  - Plans to start Lexapro  soon  Tobacco use - Resumed smoking in the past year due to stress - Smokes a few times a week, primarily when feeling nervous  Vaginal odor change - Change in vaginal odor described as an 'off smell' (not fishy) - No concerns for sexually transmitted infections - Monogamous relationship with husband   OBJECTIVE:   BP (!) 143/102   Pulse 73   Ht 5' 4.5 (1.638 m)   Wt 191 lb 3.2 oz (86.7 kg)   SpO2 99%   BMI 32.31 kg/m  Gen: no acute distress, pleasant, cooperative, well apeparing HEENT: normocephalic, atraumatic  Heart: regular rate and rhythm, no murmur Chest: tenderness overlying chest wall  but does not fully reproduce pain patient reports Lungs: clear to auscultation bilaterally, normal work of breathing  Neuro: alert, speech normal, grossly nonfocal GU: normal appearing external genitalia without lesions. Vagina is moist with normal discharge. Cervix normal in appearance. No cervical motion tenderness or tenderness on bimanual exam. No adnexal masses. Chaperone present: Cassell Mary, CMA    ASSESSMENT/PLAN:   Assessment & Plan Vaginal odor Reports off smell and irritation, suggesting possible bacterial vaginosis. No concerns for sexually transmitted infections. - wet prep today - Perform Pap smear as due in January. Primary hypertension Recent elevated blood pressure readings. Amlodipine chosen due to no lab monitoring requirement. Discussed potential side effect of leg swelling. - Initiate amlodipine 5 mg daily. - Schedule follow-up in 2 weeks for blood pressure recheck. Routine adult health maintenance Pap today Hep B surface antibody today Update lipids & BMET today Given phone # to schedule colonoscopy COVID vaccine today Mood disorder (HCC) Plannign to start lexapro  as previously prescribed by Dr. Belvia Genital herpes simplex, unspecified site Recurrent outbreaks likely exacerbated by stress. She prefers daily suppressive therapy. - Prescribe daily suppressive therapy with Valtrex . Chest pain, unspecified type Intermittent ongoing sharp chest pain with shortness of breath. Pain reproducible with palpation suggests musculoskeletal component, but cardiac causes need to be ruled out. - reordered chest x-ray. - EKG offered to patient today but she declined, had time constraints to visit and she needed to leave soon. Doubt this would have been helpful as pain is similar to pain reported last year - Refer to  cardiologist for further evaluation. - Order echocardiogram to assess cardiac function given concomitant shortness of breath.     Follow up  separately to discuss pains in body & headaches - could not get to this today given multiple other complaints addressed   [place] J. Donah, MD [facility]

## 2024-04-24 ENCOUNTER — Other Ambulatory Visit

## 2024-04-24 ENCOUNTER — Ambulatory Visit

## 2024-04-24 DIAGNOSIS — Z0184 Encounter for antibody response examination: Secondary | ICD-10-CM | POA: Diagnosis not present

## 2024-04-24 DIAGNOSIS — I10 Essential (primary) hypertension: Secondary | ICD-10-CM | POA: Diagnosis not present

## 2024-04-24 LAB — CYTOLOGY - PAP
Comment: NEGATIVE
Diagnosis: NEGATIVE
High risk HPV: NEGATIVE

## 2024-04-25 DIAGNOSIS — I10 Essential (primary) hypertension: Secondary | ICD-10-CM | POA: Insufficient documentation

## 2024-04-25 LAB — HEPATITIS B SURFACE ANTIBODY, QUANTITATIVE: Hepatitis B Surf Ab Quant: 55.6 m[IU]/mL

## 2024-04-25 LAB — LIPID PANEL
Chol/HDL Ratio: 2.3 ratio (ref 0.0–4.4)
Cholesterol, Total: 152 mg/dL (ref 100–199)
HDL: 65 mg/dL (ref >39)
LDL Chol Calc (NIH): 49 mg/dL (ref 0–99)
Triglycerides: 245 mg/dL — ABNORMAL HIGH (ref 0–149)
VLDL Cholesterol Cal: 38 mg/dL (ref 5–40)

## 2024-04-25 LAB — BASIC METABOLIC PANEL WITH GFR
BUN/Creatinine Ratio: 13 (ref 9–23)
BUN: 11 mg/dL (ref 6–24)
CO2: 20 mmol/L (ref 20–29)
Calcium: 9.2 mg/dL (ref 8.7–10.2)
Chloride: 102 mmol/L (ref 96–106)
Creatinine, Ser: 0.82 mg/dL (ref 0.57–1.00)
Glucose: 146 mg/dL — ABNORMAL HIGH (ref 70–99)
Potassium: 4.1 mmol/L (ref 3.5–5.2)
Sodium: 138 mmol/L (ref 134–144)
eGFR: 89 mL/min/1.73 (ref >59)

## 2024-04-26 NOTE — Assessment & Plan Note (Signed)
 Recent elevated blood pressure readings. Amlodipine chosen due to no lab monitoring requirement. Discussed potential side effect of leg swelling. - Initiate amlodipine 5 mg daily. - Schedule follow-up in 2 weeks for blood pressure recheck.

## 2024-04-26 NOTE — Assessment & Plan Note (Signed)
 Plannign to start lexapro  as previously prescribed by Dr. Treadwell-Smucker

## 2024-04-26 NOTE — Assessment & Plan Note (Signed)
 Intermittent ongoing sharp chest pain with shortness of breath. Pain reproducible with palpation suggests musculoskeletal component, but cardiac causes need to be ruled out. - reordered chest x-ray. - EKG offered to patient today but she declined, had time constraints to visit and she needed to leave soon. Doubt this would have been helpful as pain is similar to pain reported last year - Refer to cardiologist for further evaluation. - Order echocardiogram to assess cardiac function given concomitant shortness of breath.

## 2024-04-26 NOTE — Assessment & Plan Note (Signed)
 Pap today Hep B surface antibody today Update lipids & BMET today Given phone # to schedule colonoscopy COVID vaccine today

## 2024-04-26 NOTE — Assessment & Plan Note (Signed)
 Recurrent outbreaks likely exacerbated by stress. She prefers daily suppressive therapy. - Prescribe daily suppressive therapy with Valtrex .

## 2024-04-27 ENCOUNTER — Telehealth: Payer: Self-pay

## 2024-04-27 DIAGNOSIS — E781 Pure hyperglyceridemia: Secondary | ICD-10-CM

## 2024-04-27 DIAGNOSIS — R7301 Impaired fasting glucose: Secondary | ICD-10-CM

## 2024-04-27 NOTE — Telephone Encounter (Signed)
 Patient calls nurse line regarding recent results.   She is concerned about elevated triglyceride levels.   She is asking what she should be doing to try to lower/manage this level.   Will forward to PCP.   Please advise.   Chiquita JAYSON English, RN

## 2024-04-27 NOTE — Telephone Encounter (Signed)
 The lab says it was collected at 4:18 in the afternoon, which likely means she was not fasting for the appointment.  If she was not fasting, the triglycerides can be elevated from having recently eaten.  Can you clarify with patient if she indeed got the labs drawn at that time and was not fasting? We can recheck a fasting lipid panel if she desires.  I am out of the office this week but Dr. Delores is covering for me, or I am happy to chat with the patient when I get back next week.  Thanks, Dr. Donah

## 2024-04-28 NOTE — Telephone Encounter (Signed)
 Called patient and scheduled lab visit for lipid panel.   Patient states that Dr. Donah also advised her in [facility] message that glucose was elevated and would like to check A1c.   Patient is asking if A1c can be added to this blood work to save an additional stick.   Will reach out to Dr.Brown for orders.   Thanks.   Chiquita JAYSON English, RN

## 2024-04-28 NOTE — Telephone Encounter (Signed)
 I have ordered the lipid panel as future--- please schedule for lab visit.  Suzann Daring, MD  Family Medicine Teaching Service

## 2024-04-28 NOTE — Telephone Encounter (Signed)
 A1c ordered  Suzann Daring, MD  [facility] Medicine Teaching Service

## 2024-04-28 NOTE — Addendum Note (Signed)
 Addended by: DELORES, Roseana Rhine on: 04/28/2024 10:54 AM   Modules accepted: Orders

## 2024-04-28 NOTE — Telephone Encounter (Signed)
 Called patient.   She reports her lab work was later in the afternoon and she reports she had been eating and drinking all day.   She reports she would like to make an early lab visit for repeat lipid panel.   She reports she will be fasting this time.    Advised will forward to PCP to place order and will call her back to schedule.

## 2024-04-29 ENCOUNTER — Other Ambulatory Visit

## 2024-04-29 DIAGNOSIS — E781 Pure hyperglyceridemia: Secondary | ICD-10-CM | POA: Diagnosis not present

## 2024-04-29 DIAGNOSIS — R7301 Impaired fasting glucose: Secondary | ICD-10-CM

## 2024-04-30 ENCOUNTER — Encounter

## 2024-04-30 ENCOUNTER — Ambulatory Visit
Admission: RE | Admit: 2024-04-30 | Discharge: 2024-04-30 | Disposition: A | Discharge status: Home / Self Care | Source: Ambulatory Visit | Attending: Family Medicine | Admitting: Family Medicine

## 2024-04-30 ENCOUNTER — Ambulatory Visit: Payer: Self-pay | Admitting: Family Medicine

## 2024-04-30 DIAGNOSIS — Z1231 Encounter for screening mammogram for malignant neoplasm of breast: Secondary | ICD-10-CM | POA: Diagnosis not present

## 2024-04-30 LAB — LIPID PANEL
Chol/HDL Ratio: 2.4 ratio (ref 0.0–4.4)
Cholesterol, Total: 166 mg/dL (ref 100–199)
HDL: 70 mg/dL (ref >39)
LDL Chol Calc (NIH): 81 mg/dL (ref 0–99)
Triglycerides: 78 mg/dL (ref 0–149)
VLDL Cholesterol Cal: 15 mg/dL (ref 5–40)

## 2024-04-30 LAB — HEMOGLOBIN A1C
Est. average glucose Bld gHb Est-mCnc: 128 mg/dL
Hgb A1c MFr Bld: 6.1 % — ABNORMAL HIGH (ref 4.8–5.6)

## 2024-05-04 ENCOUNTER — Ambulatory Visit: Payer: Self-pay | Admitting: Family Medicine

## 2024-06-08 ENCOUNTER — Ambulatory Visit

## 2024-06-08 NOTE — Progress Notes (Deleted)
    SUBJECTIVE:   CHIEF COMPLAINT / HPI:   HTN BP Readings from Last 4 Encounters:  04/23/24 (!) 143/102  04/13/24 119/88  04/10/24 (!) 146/91  02/17/24 131/75   - amlodipine 5mg  started at last visit  Contraception - has been on depo, last done in July   PERTINENT  PMH / PSH: HA, depression, mood disorder,   OBJECTIVE:   There were no vitals taken for this visit.  Physical Exam General: Alert, conversant, cooperative. No acute distress.  HEENT: PERRL. EOMI. MMM.  Cardiovascular: RRR Respiratory: Lungs CTAB. Normal work of breathing. Abdomen: Non distended Extremities: No cyanosis. No edema Musculoskeletal: No gross deformities.  Skin: Warm. Dry. No rashes. No icterus.  Neurologic: No focal deficits. Moving all extremities. Psychiatric: Cooperative. Appropriate mood. Appropriate affect.   ASSESSMENT/PLAN:   Assessment & Plan Primary hypertension      Yesenia LITTIE Deed, MD [facility]

## 2024-06-21 ENCOUNTER — Other Ambulatory Visit: Payer: Self-pay | Admitting: Family Medicine

## 2024-06-26 ENCOUNTER — Ambulatory Visit: Attending: Nurse Practitioner | Admitting: Nurse Practitioner

## 2024-06-26 NOTE — Progress Notes (Deleted)
 Office Visit    Patient Name: Yesenia Weeks Date of Encounter: 06/26/2024  Primary Care Provider:  Donah Laymon PARAS, MD Primary Cardiologist:  None  Chief Complaint    48 year old female with a history of hypertension, gestational diabetes, iron deficiency anemia, ADHD, anxiety, depression, and tobacco use who presents for [facility] clinic new patient evaluation.  Past Medical History    Past Medical History:  Diagnosis Date   ADHD (attention deficit hyperactivity disorder)    Allergy    Anemia    iron def   Anxiety    stressed with preg, worry about baby   ASTEATOTIC ECZEMA 09/10/2008   Qualifier: Diagnosis of  By: Gerlene NP, [place]     Asthma    BV (bacterial vaginosis)    recurrent-uses boric acid   Depression    Dysmenorrhea    Gestational diabetes    Gestational diabetes mellitus (GDM), antepartum 10/08/2019   HSV infection    Infection    UTI   Ovarian cyst    left, states was removed   Preterm labor    Seizures (HCC)    up until age 9   Substance abuse (HCC)    Supervision of high risk pregnancy, antepartum 07/21/2019    Nursing Staff Provider Office Location  [facility] Dating  14 week US  Language   English Anatomy US   Normal Flu Vaccine   05/22/19 Genetic Screen  NIPS:normal Mat21 TDaP vaccine   10/07/19 Hgb A1C or  GTT Early  Third trimester  Rhogam   NA   LAB RESULTS  Feeding Plan  Breast/Bottle Blood Type A/Positive/-- (11/23 1515)  Contraception  IPP IUD - Liletta  Antibody Negative (11/23 1515) Circumcision  y   Past Surgical History:  Procedure Laterality Date   CESAREAN SECTION N/A 12/04/2019   Procedure: CESAREAN SECTION;  Surgeon: Lorence Ozell CROME, MD;  Location: [facility] LD ORS;  Service: Obstetrics;  Laterality: N/A;   CYSTECTOMY     HERNIA REPAIR     x2   WISDOM TOOTH EXTRACTION      Allergies  No Known Allergies   Labs/Other Studies Reviewed    The following studies were reviewed today:     Recent Labs: 04/13/2024: Hemoglobin 12.6;  Platelets 288; TSH 0.909 04/24/2024: BUN 11; Creatinine, Ser 0.82; Potassium 4.1; Sodium 138  Recent Lipid Panel    Component Value Date/Time   CHOL 166 04/29/2024 1048   TRIG 78 04/29/2024 1048   HDL 70 04/29/2024 1048   CHOLHDL 2.4 04/29/2024 1048   CHOLHDL 2.2 03/31/2014 1222   VLDL 15 03/31/2014 1222   LDLCALC 81 04/29/2024 1048    History of Present Illness    48 year old female with the above past medical history including hypertension, gestational diabetes, iron deficiency anemia, ADHD, anxiety, depression, and tobacco use.  She saw her PCP on 04/23/2024 and reported a 1 year history of intermittent nonexertional chest pain with associated shortness of breath.  Pain was reproducible with palpation.  BP was elevated.  She was started on amlodipine   Echocardiogram was ordered and is pending.  She was referred to cardiology for other evaluation.  She presents today for heart first clinic new patient evaluation.   CC: -Initial onset:  -Frequency/Duration:  -Associated symptoms:  -Aggravating/alleviating factors:  -Syncope/near syncope: -Prior cardiac history: -Prior ECG:  -Prior workup: -Prior treatment:  -Possible medication interactions:  -Caffeine:  -Alcohol:  -Tobacco: -OTC supplements:  -Comorbidities:  -Exercise level:  -Labs:  -Cardiac ROS: no chest pain, no shortness of  breath, no PND, no orthopnea, no LE edema. She denies any headaches. -Family history:     Home Medications    Current Outpatient Medications  Medication Sig Dispense Refill   amLODipine  (NORVASC ) 5 MG tablet Take 1 tablet (5 mg total) by mouth at bedtime. 90 tablet 1   diclofenac  Sodium (VOLTAREN ) 1 % GEL Apply 4 g topically 4 (four) times daily as needed. 50 g 1   escitalopram  (LEXAPRO ) 10 MG tablet Take 1 tablet (10 mg total) by mouth daily. 30 tablet 0   loratadine  (CLARITIN ) 10 MG tablet TAKE 1 TABLET(10 MG) BY MOUTH DAILY 90 tablet 3   valACYclovir  (VALTREX ) 500 MG tablet Take 1  tablet (500 mg total) by mouth daily. 90 tablet 3   No current facility-administered medications for this visit.     Review of Systems    ***.  All other systems reviewed and are otherwise negative except as noted above.    Physical Exam    VS:  There were no vitals taken for this visit. , BMI There is no height or weight on file to calculate BMI.     GEN: Well nourished, well developed, in no acute distress. HEENT: normal. Neck: Supple, no JVD, carotid bruits, or masses. Cardiac: RRR, no murmurs, rubs, or gallops. No clubbing, cyanosis, edema.  Radials/DP/PT 2+ and equal bilaterally.  Respiratory:  Respirations regular and unlabored, clear to auscultation bilaterally. GI: Soft, nontender, nondistended, BS + x 4. MS: no deformity or atrophy. Skin: warm and dry, no rash. Neuro:  Strength and sensation are intact. Psych: Normal affect.  Accessory Clinical Findings    ECG personally reviewed by me today -    - no acute changes.   Lab Results  Component Value Date   WBC 10.4 04/13/2024   HGB 12.6 04/13/2024   HCT 40.5 04/13/2024   MCV 85 04/13/2024   PLT 288 04/13/2024   Lab Results  Component Value Date   CREATININE 0.82 04/24/2024   BUN 11 04/24/2024   NA 138 04/24/2024   K 4.1 04/24/2024   CL 102 04/24/2024   CO2 20 04/24/2024   Lab Results  Component Value Date   ALT 13 12/28/2021   AST 23 12/28/2021   ALKPHOS 89 12/28/2021   BILITOT 0.3 12/28/2021   Lab Results  Component Value Date   CHOL 166 04/29/2024   HDL 70 04/29/2024   LDLCALC 81 04/29/2024   TRIG 78 04/29/2024   CHOLHDL 2.4 04/29/2024    Lab Results  Component Value Date   HGBA1C 6.1 (H) 04/29/2024    Assessment & Plan    1.  ***  No BP recorded.  {Refresh Note OR Click here to enter BP  :1}***   Damien JAYSON Braver, NP 06/26/2024, 6:04 AM

## 2024-08-08 ENCOUNTER — Other Ambulatory Visit: Payer: Self-pay

## 2024-08-10 NOTE — Telephone Encounter (Signed)
Please let patient know I am refilling this medication, but she needs to schedule an appointment with me.   Thanks, Leeanne Rio, MD
